# Patient Record
Sex: Female | Born: 1945 | Race: White | Hispanic: No | Marital: Married | Smoking: Never smoker
Health system: Southern US, Community
[De-identification: ages and names within clinical notes are randomized; demographics above are authoritative.]

## PROBLEM LIST (undated history)

## (undated) DIAGNOSIS — I10 Essential (primary) hypertension: Secondary | ICD-10-CM

## (undated) DIAGNOSIS — E7800 Pure hypercholesterolemia, unspecified: Secondary | ICD-10-CM

## (undated) DIAGNOSIS — K2970 Gastritis, unspecified, without bleeding: Secondary | ICD-10-CM

## (undated) DIAGNOSIS — E785 Hyperlipidemia, unspecified: Secondary | ICD-10-CM

## (undated) DIAGNOSIS — R0600 Dyspnea, unspecified: Secondary | ICD-10-CM

## (undated) DIAGNOSIS — M353 Polymyalgia rheumatica: Secondary | ICD-10-CM

## (undated) HISTORY — DX: Pure hypercholesterolemia, unspecified: E78.00

## (undated) HISTORY — DX: Gastritis, unspecified, without bleeding: K29.70

## (undated) HISTORY — PX: TUBAL LIGATION: SHX77

## (undated) HISTORY — PX: SPINE SURGERY: SHX786

## (undated) HISTORY — DX: Hyperlipidemia, unspecified: E78.5

## (undated) HISTORY — DX: Essential (primary) hypertension: I10

---

## 1993-05-18 HISTORY — PX: SPINE SURGERY: SHX786

## 2003-11-09 LAB — HM COLONOSCOPY

## 2004-03-03 LAB — HM COLONOSCOPY

## 2004-04-16 ENCOUNTER — Ambulatory Visit: Payer: Self-pay | Admitting: Family Medicine

## 2004-07-06 LAB — HM COLONOSCOPY

## 2005-11-09 ENCOUNTER — Ambulatory Visit: Payer: Self-pay | Admitting: Gynecology

## 2005-11-09 ENCOUNTER — Encounter (INDEPENDENT_AMBULATORY_CARE_PROVIDER_SITE_OTHER): Payer: Self-pay | Admitting: Gynecology

## 2005-12-16 HISTORY — PX: CARDIAC CATHETERIZATION: SHX172

## 2005-12-29 ENCOUNTER — Ambulatory Visit: Payer: Self-pay | Admitting: Internal Medicine

## 2006-11-25 ENCOUNTER — Ambulatory Visit: Payer: Self-pay | Admitting: Obstetrics & Gynecology

## 2006-11-25 ENCOUNTER — Encounter: Payer: Self-pay | Admitting: Obstetrics & Gynecology

## 2008-01-05 ENCOUNTER — Ambulatory Visit: Payer: Self-pay | Admitting: Obstetrics & Gynecology

## 2008-01-05 ENCOUNTER — Encounter: Payer: Self-pay | Admitting: Obstetrics & Gynecology

## 2008-07-06 LAB — HM DEXA SCAN

## 2009-01-30 ENCOUNTER — Encounter: Payer: Self-pay | Admitting: Obstetrics & Gynecology

## 2009-01-30 ENCOUNTER — Ambulatory Visit: Payer: Self-pay | Admitting: Obstetrics & Gynecology

## 2009-02-14 ENCOUNTER — Ambulatory Visit: Payer: Self-pay | Admitting: Obstetrics & Gynecology

## 2010-02-06 ENCOUNTER — Ambulatory Visit: Payer: Self-pay | Admitting: Obstetrics & Gynecology

## 2010-09-30 NOTE — Assessment & Plan Note (Signed)
[name], [name] NO.:  [id number]   MEDICAL RECORD NO.:  [id number]          PATIENT TYPE:  POB   LOCATION:  [facility] at [facility]         FACILITY:  [facility]   PHYSICIAN:  Elsie Lincoln, MD      DATE OF BIRTH:  06/20/1945   DATE OF SERVICE:  01/30/2009                                  CLINIC NOTE   The patient is a 65 year old female, who presents for yearly exam.  The  patient's libido is still decreased, however, husbands Viagra is  not  working as well, so their sexual encounter seem to be less which makes  the patient happy.  She never got a testosterone cream healed.  The  patient is having some left lower quadrant pain that is intermittent and  does not seem to be associated with bowel movements or urination.  We  will get a transvaginal ultrasound to look at her ovaries.  The patient  gets yearly exams from [facility] and is followed closely.  She has not  had any postmenopausal bleeding or any other complaints other than the  left lower quadrant pain.   PAST MEDICAL HISTORY:  Hypertension, hypercholesterolemia, non-insulin-  dependent diabetes mellitus.   SURGERY:  Spinal surgery.  She also had a bone graft for some dental  issues.   MEDICATIONS:  Crestor, metformin, aspirin, calcium, atenolol,  multivitamin, and fish oil.   ALLERGIES:  AMOXICILLIN and PENICILLIN, these are new allergies.  She  developed hives and had to take PREDNISONE.   FAMILY HISTORY:  No changes.   SOCIAL HISTORY:  No changes.   PHYSICAL EXAMINATION:  VITAL SIGNS:  Pulse 61, blood pressure 108/70,  weight 188, height 5 feet 6 inches.  GENERAL:  Well-nourished, well-developed, in no apparent distress.  HEENT:  Normocephalic, atraumatic.  Left lower jaw was missing teeth.  Thyroid, no masses.  LUNGS:  Clear to auscultation bilaterally.  HEART:  Regular rate and rhythm.  BREASTS:  No masses, nontender.  No lymphadenopathy.  No nipple  discharge.  ABDOMEN:  Obese, soft,  nontender.  No organomegaly.  No hernia.  No  rebound.  No guarding.  GENITALIA:  Tanner V.  Vagina atrophic, moderate cystocele.  Cervix  closed.  Uterus nontender.  Adnexa nontender.  No masses.  Hemorrhoid  present.  No rectocele.  EXTREMITIES:  Nontender.   ASSESSMENT AND PLAN:  A 65 year old female for yearly exam:  1. Pap smear.  2. Mammogram.  3. Transvaginal ultrasound to evaluate left lower quadrant pain.  4. Return to clinic for results in 2-3 weeks.           ______________________________  Elsie Lincoln, MD     KL/MEDQ  D:  01/30/2009  T:  01/31/2009  Job:  [id number]

## 2010-09-30 NOTE — Group Therapy Note (Signed)
[name], [name] NO.:  [id number]   MEDICAL RECORD NO.:  [id number]          PATIENT TYPE:  POB   LOCATION:  [facility]                   FACILITY:  [facility]   PHYSICIAN:  Sydney Lincoln, MD      DATE OF BIRTH:  10-01-1945   DATE OF SERVICE:                                  CLINIC NOTE   The patient comes to the office today for her yearly pap and pelvic. She  has no obvious complaints. She has noticed that when we discussed her  height and weight that she has noticed some height loss. She did have  spinal surgery several years ago. Her last bone mineral density was  several years ago.   She is currently well controlled with her diabetes, hypertension, and  high cholesterol by Dr. Burnett [name]. She has been having some recent  problems with some intermittent chest pain. She has had this evaluated  by a cardiologist. All of her stress tests were normal. She did have a  cardiac catheterization which was normal. From a female perspective, she  is not having any complaints. She is currently not on any hormone  replacement.   PHYSICAL EXAMINATION:  GENERAL:  This is a well developed, well  nourished 65 year old Caucasian in no acute distress.  VITAL SIGNS:  Blood pressure 130/76, pulse 56, weight 183, height 5 feet  6 inches.  HEENT:  No abnormalities. Thyroid was examined with no nodules noted.  BREASTS:  No masses, discharges, or tenderness.  CARDIAC:  Regular rate and rhythm, no murmurs, rubs, or thrills.  LUNGS:  Clear bilaterally with no wheezes noted.  ABDOMEN:  Positive bowel sounds. No tenderness or masses felt.  PELVIC:  Externally there were no lesions or discharges. Internally, the  vaginal vault is pink and smooth, her cervix is without lesions. Pap  smear was obtained. By manual exam, no cervical motion tenderness,  uterus was nontender, no adnexal masses.  SKIN:  No moles or rashes noted.   MEDICATIONS:  Crestor, Metformin, aspirin, calcium, and  Atenolol.   ASSESSMENT:  Well woman exam.   PLAN:  The patient will need to get a follow up bone mineral density as  previously discussed. [facility]  She will also need to have her yearly mammogram. She will have her  hypertension,  high cholesterol, and diabetes cared for again by Dr. Burnett [name]. She will  follow up here in this office in one year for her pap smear unless she  has difficulties, and then she may return sooner.      Sydney Richter, NP    ______________________________  Sydney Lincoln, MD    LR/MEDQ  D:  11/25/2006  T:  11/26/2006  Job:  [id number]

## 2010-09-30 NOTE — Assessment & Plan Note (Signed)
NAMEJENISIS, HARMSEN NO.:  [id number]   MEDICAL RECORD NO.:  [id number]          PATIENT TYPE:  POB   LOCATION:  [facility]         FACILITY:  [facility]   PHYSICIAN:  Elsie Lincoln, MD      DATE OF BIRTH:  1945-05-30   DATE OF SERVICE:  01/05/2008                                  CLINIC NOTE   The patient is a 65 year old female who presents for yearly physical.  Her only complaint is decreased libido.  Her husband wants sex almost  everyday, and she dreads going to bed.  Her husband does take Viagra  that is given to them by their joint general practitioner and suggested  that she talk to her husband and try to set limits and boundaries and  for him to only take the Viagra once the mutual agreement of sexual  intercourse is agreed upon.  Also, discussed the limitations of  testosterone cream and she was given a prescription for the testosterone  cream to the labia and clitoris.  She is not sure when she is going to  get it filled.  We did discuss side effects.   PAST MEDICAL HISTORY:  Hypertension, hypercholesterolemia, non-insulin-  dependent diabetes mellitus, these are all managed by Dr. Burnett Sheng.   SURGERY:  Spinal surgery.   MEDICATIONS:  Crestor, metformin, aspirin, calcium, and atenolol.   ALLERGIES:  Denies.   FAMILY HISTORY:  No changes.   SOCIAL HISTORY:  No changes.   PHYSICAL EXAMINATION:  VITAL SIGNS:  Pulse 61, blood pressure 109/71,  and weight 188.  GENERAL:  A well nourished and well developed, in no apparent distress.  HEENT:  Normocephalic and atraumatic.  Good dentition.  NECK:  Thyroid, no masses.  LUNGS:  Clear to auscultation bilaterally.  HEART:  Regular rate and rhythm.  BREASTS:  No masses and nontender.  No lymphadenopathy.  No nipple  discharge.  ABDOMEN:  Soft and nontender.  No organomegaly.  No hernia.  GENITALIA:  Tanner V.  Vagina atrophic, mild cystocele, and nontender.  Urethra nontender.  Cervix mild prolapse  and asymptomatic.  Uterus  nontender.  Adnexa no masses and nontender.  Rectovaginal, no  nodularity.  EXTREMITIES:  Nontender.   ASSESSMENT AND PLAN:  A 65 year old female for yearly exam.  1. Pap smear.  2. Mammogram.  3. Testosterone cream for decreased libido.  4. Return to clinic in a year.           ______________________________  Elsie Lincoln, MD     KL/MEDQ  D:  01/05/2008  T:  01/06/2008  Job:  [id number]

## 2010-09-30 NOTE — Assessment & Plan Note (Signed)
[name], [name] NO.:  [id number]   MEDICAL RECORD NO.:  [id number]          PATIENT TYPE:  POB   LOCATION:  [facility] at [facility]         FACILITY:  [facility]   PHYSICIAN:  Elsie Lincoln, MD      DATE OF BIRTH:  01/18/1946   DATE OF SERVICE:  02/06/2010                                  CLINIC NOTE   HISTORY:  The patient is a 65 year old female who presents for annual  exam.  The patient's only complaint is having some increased gas.  She  was recently started on iron for some mild anemia and she stopped, so I  do not think this is what is causing her gas.  She denies any diarrhea  or constipation.  She recently had a transvaginal ultrasound for left  upper quadrant pain which was normal.  I do not believe the gas is  caused from her gynecological organs.  If it does continue, I believe  she should get her primary care doctor.  She had a colonoscopy 7 years  ago that was normal.  The patient denies any new gynecological issues.  She has had no postmenopausal bleeding.  Her relationship with her  husband seems to improve as her husband has stopped asking for sex as  often, so her decreased libido is not as much of an issue.   PAST MEDICAL HISTORY:  No changes.  She does have non-insulin-dependent  diabetes, hypertension, and high cholesterol.   PAST SURGICAL HISTORY:  No changes.  She has had a spinal surgery and a  bone graft, and some dental issues.   MEDICATIONS:  No changes.  Crestor, metformin, aspirin, calcium,  atenolol, multivitamin, fish oil, and calcium.   ALLERGIES:  AMOXICILLIN and PENICILLIN.   FAMILY HISTORY:  No history of breast, ovarian, or endometrial cancer.   SOCIAL HISTORY:  No changes.  She lives with her husband and seems  happy.   PHYSICAL EXAMINATION:  VITAL SIGNS:  Pulse 60, blood pressure 125/80,  weight 192, and height 5 feet 6 inches.  GENERAL:  Well-nourished, well-developed, no apparent stress.  HEENT:  Normocephalic and  atraumatic.  Thyroid, no masses.  LUNGS:  Clear to auscultation bilaterally.  HEART:  Regular rate and rhythm.  BREASTS:  No masses, nontender, and no lymphadenopathy.  No nipple  discharge.  No skin changes.  ABDOMEN:  Soft, nontender, slightly obese.  No organomegaly.  No hernia.  GENITALIA:  Tanner V.  Pap smear was not done today secondary she has  had a normal Pap smear when the Pap smear was done last year.  Small  cystocele and rectocele.  Cervix, closed.  Uterus is small and  nontender.  Adnexa, ovaries not felt.  Rectovaginal nontender.  No  nodularity or rectocele.  EXTREMITIES:  No edema.   ASSESSMENT AND PLAN:  A 65 year old female for well-woman exam.  1. Normal bimanual.  2. Mammogram ordered.  3. Pap smear next year.  4. Continue calcium.   The patient had relatively normal bone mineral density exam in 2009,  will need one in 2014.           ______________________________  Elsie Lincoln,  MD     KL/MEDQ  D:  02/06/2010  T:  02/07/2010  Job:  [id number]

## 2011-02-11 ENCOUNTER — Encounter: Payer: Self-pay | Admitting: Internal Medicine

## 2011-02-11 ENCOUNTER — Ambulatory Visit (INDEPENDENT_AMBULATORY_CARE_PROVIDER_SITE_OTHER): Payer: BC Managed Care – PPO | Admitting: Internal Medicine

## 2011-02-11 DIAGNOSIS — E559 Vitamin D deficiency, unspecified: Secondary | ICD-10-CM

## 2011-02-11 DIAGNOSIS — E1169 Type 2 diabetes mellitus with other specified complication: Secondary | ICD-10-CM | POA: Insufficient documentation

## 2011-02-11 DIAGNOSIS — I10 Essential (primary) hypertension: Secondary | ICD-10-CM

## 2011-02-11 DIAGNOSIS — K5900 Constipation, unspecified: Secondary | ICD-10-CM

## 2011-02-11 DIAGNOSIS — D509 Iron deficiency anemia, unspecified: Secondary | ICD-10-CM

## 2011-02-11 DIAGNOSIS — K209 Esophagitis, unspecified without bleeding: Secondary | ICD-10-CM

## 2011-02-11 DIAGNOSIS — E785 Hyperlipidemia, unspecified: Secondary | ICD-10-CM | POA: Insufficient documentation

## 2011-02-11 DIAGNOSIS — R110 Nausea: Secondary | ICD-10-CM

## 2011-02-11 DIAGNOSIS — E119 Type 2 diabetes mellitus without complications: Secondary | ICD-10-CM

## 2011-02-11 DIAGNOSIS — Z79899 Other long term (current) drug therapy: Secondary | ICD-10-CM

## 2011-02-11 MED ORDER — OMEPRAZOLE 40 MG PO CPDR: 40.0000 mg | DELAYED_RELEASE_CAPSULE | Freq: Every day | ORAL | Status: DC

## 2011-02-11 MED ORDER — HYOSCYAMINE SULFATE 0.125 MG SL SUBL: 0.1250 mg | SUBLINGUAL_TABLET | SUBLINGUAL | Status: DC | PRN

## 2011-02-11 NOTE — Progress Notes (Signed)
  Subjective:    Patient ID: [name], female    DOB: 1945/08/30, 65 y.o.   MRN: [id number]  HPI  65 yo white female with diabetes mellitus, hyperlipidemia, and hypertension who presents for establishment of care with chief complaint of nausea accompanied by chest and jaw pain.  She   has been worked up for chest pain and jaw pain with myoview which was normal,followed by a cardiaccath which was normal in  2008.She could not afford nexium, so she is using prilosec with limited success.  No prior GI workup.  Has occasional dysphgia  And several episodes or regurgitation of what appeared to be water   Review of Systems  Constitutional: Negative for fever, chills and unexpected weight change.  HENT: Negative for hearing loss, ear pain, nosebleeds, congestion, sore throat, facial swelling, rhinorrhea, sneezing, mouth sores, trouble swallowing, neck pain, neck stiffness, voice change, postnasal drip, sinus pressure, tinnitus and ear discharge.   Eyes: Negative for pain, discharge, redness and visual disturbance.  Respiratory: Negative for cough, chest tightness, shortness of breath, wheezing and stridor.   Cardiovascular: Negative for chest pain, palpitations and leg swelling.  Gastrointestinal: Positive for nausea and constipation.  Musculoskeletal: Negative for myalgias and arthralgias.  Skin: Negative for color change and rash.  Neurological: Negative for dizziness, weakness, light-headedness and headaches.  Hematological: Negative for adenopathy.       Objective:   Physical Exam  Constitutional: She is oriented to person, place, and time. She appears well-developed and well-nourished.  HENT:  Mouth/Throat: Oropharynx is clear and moist.  Eyes: EOM are normal. Pupils are equal, round, and reactive to light. No scleral icterus.  Neck: Normal range of motion. Neck supple. No JVD present. No thyromegaly present.  Cardiovascular: Normal rate, regular rhythm, normal heart sounds and intact  distal pulses.   Pulmonary/Chest: Effort normal and breath sounds normal.  Abdominal: Soft. Bowel sounds are normal. She exhibits no mass. There is no tenderness.  Musculoskeletal: Normal range of motion. She exhibits no edema.  Lymphadenopathy:    She has no cervical adenopathy.  Neurological: She is alert and oriented to person, place, and time.  Skin: Skin is warm and dry.  Psychiatric: She has a normal mood and affect.          Assessment & Plan:  Screening for cervical cancer : normal one year ago Sept 2012.  Screening for breast Ca:  Done by GYN in Sept 2012

## 2011-02-11 NOTE — Assessment & Plan Note (Signed)
With episodes of chest pain and water brash despite daily use of omeprazole 20 mg daily.  Will increased prilsec to 40 mg daily,  Add trial of hyoscyamine and refer to GI for evaluation

## 2011-02-11 NOTE — Assessment & Plan Note (Signed)
Managed with atenolol once daily, which apears to be effective.

## 2011-02-11 NOTE — Assessment & Plan Note (Signed)
She is not restricting her carbohydrates or exercising regularly .  Spent 10 minutes reviewing diet and need for regular exercise. hgba1c is due.

## 2011-02-11 NOTE — Assessment & Plan Note (Signed)
Managed with crestor due to failure to reach goal with other  Statins.

## 2011-02-17 ENCOUNTER — Encounter: Payer: Self-pay | Admitting: Family Medicine

## 2011-02-17 ENCOUNTER — Ambulatory Visit (INDEPENDENT_AMBULATORY_CARE_PROVIDER_SITE_OTHER): Payer: BC Managed Care – PPO | Admitting: Family Medicine

## 2011-02-17 VITALS — BP 121/72 | HR 63 | Ht 65.0 in | Wt 189.0 lb

## 2011-02-17 DIAGNOSIS — Z1272 Encounter for screening for malignant neoplasm of vagina: Secondary | ICD-10-CM

## 2011-02-17 DIAGNOSIS — Z01419 Encounter for gynecological examination (general) (routine) without abnormal findings: Secondary | ICD-10-CM

## 2011-02-17 LAB — HM PAP SMEAR: HM Pap smear: NORMAL

## 2011-02-17 NOTE — Patient Instructions (Signed)

## 2011-02-17 NOTE — Progress Notes (Signed)
  Subjective:     [name] is a 65 y.o. female and is here for a comprehensive physical exam. The patient reports problems - Chest pain, seeing PCP, negative cardiac w/u waiting on GI, constipation.  Does exercise.Marland Kitchen  History   Social History  . Marital Status: Married    Spouse Name: N/A    Number of Children: N/A  . Years of Education: N/A   Occupational History  . Not on file.   Social History Main Topics  . Smoking status: Never Smoker   . Smokeless tobacco: Never Used  . Alcohol Use: No  . Drug Use: No  . Sexually Active: Not on file   Other Topics Concern  . Not on file   Social History Narrative  . No narrative on file   Health Maintenance  Topic Date Due  . Pap Smear  03/01/1965  . Tetanus/tdap  03/01/1966  . Mammogram  03/01/1997  . Colonoscopy  03/01/1997  . Zostavax  03/02/2007  . Influenza Vaccine  02/16/2011    The following portions of the patient's history were reviewed and updated as appropriate: allergies, current medications, past family history, past medical history, past social history, past surgical history and problem list.  Review of Systems A comprehensive review of systems was negative.   Objective:    BP 121/72  Pulse 63  Ht 5\' 5"  (1.651 m)  Wt 189 lb (85.73 kg)  BMI 31.45 kg/m2 General appearance: alert, cooperative and appears stated age Head: Normocephalic, without obvious abnormality, atraumatic Neck: no adenopathy, supple, symmetrical, trachea midline and thyroid not enlarged, symmetric, no tenderness/mass/nodules Lungs: clear to auscultation bilaterally Breasts: normal appearance, no masses or tenderness, No nipple retraction or dimpling Heart: regular rate and rhythm, S1, S2 normal, no murmur, click, rub or gallop Abdomen: soft, non-tender; bowel sounds normal; no masses,  no organomegaly Pelvic: cervix normal in appearance, external genitalia normal, no adnexal masses or tenderness, no cervical motion tenderness, uterus normal  size, shape, and consistency, vagina normal without discharge and atrophic.  Hemorrhoid noted Extremities: extremities normal, atraumatic, no cyanosis or edema, hammer toe Pulses: 2+ and symmetric Skin: Skin color, texture, turgor normal. No rashes or lesions Lymph nodes: Cervical, supraclavicular, and axillary nodes normal. Neurologic: Grossly normal    Assessment:    Healthy female exam. Chest pain, unclear etiology.   Plan:  Pap smear Mammogram Work up per PCP   See After Visit Summary for Counseling Recommendations

## 2011-02-18 ENCOUNTER — Other Ambulatory Visit: Payer: BC Managed Care – PPO

## 2011-02-20 ENCOUNTER — Other Ambulatory Visit (INDEPENDENT_AMBULATORY_CARE_PROVIDER_SITE_OTHER): Payer: BC Managed Care – PPO | Admitting: *Deleted

## 2011-02-20 DIAGNOSIS — I10 Essential (primary) hypertension: Secondary | ICD-10-CM

## 2011-02-20 DIAGNOSIS — E785 Hyperlipidemia, unspecified: Secondary | ICD-10-CM

## 2011-02-20 DIAGNOSIS — D509 Iron deficiency anemia, unspecified: Secondary | ICD-10-CM

## 2011-02-20 DIAGNOSIS — E119 Type 2 diabetes mellitus without complications: Secondary | ICD-10-CM

## 2011-02-20 DIAGNOSIS — K5900 Constipation, unspecified: Secondary | ICD-10-CM

## 2011-02-20 DIAGNOSIS — E559 Vitamin D deficiency, unspecified: Secondary | ICD-10-CM

## 2011-02-20 DIAGNOSIS — R110 Nausea: Secondary | ICD-10-CM

## 2011-02-20 LAB — COMPREHENSIVE METABOLIC PANEL
ALT: 21 U/L (ref 0–35)
AST: 31 U/L (ref 0–37)
Albumin: 4 g/dL (ref 3.5–5.2)
Alkaline Phosphatase: 74 U/L (ref 39–117)
BUN: 13 mg/dL (ref 6–23)
CO2: 26 mEq/L (ref 19–32)
Calcium: 9 mg/dL (ref 8.4–10.5)
Chloride: 103 mEq/L (ref 96–112)
Creatinine, Ser: 0.8 mg/dL (ref 0.4–1.2)
GFR: 76.76 mL/min (ref >60.00)
Glucose, Bld: 140 mg/dL — ABNORMAL HIGH (ref 70–99)
Potassium: 3.8 mEq/L (ref 3.5–5.1)
Sodium: 138 mEq/L (ref 135–145)
Total Bilirubin: 0.6 mg/dL (ref 0.3–1.2)
Total Protein: 7.3 g/dL (ref 6.0–8.3)

## 2011-02-20 LAB — HEMOGLOBIN A1C: Hgb A1c MFr Bld: 8.2 % — ABNORMAL HIGH (ref 4.6–6.5)

## 2011-02-20 LAB — LIPID PANEL
Cholesterol: 144 mg/dL (ref 0–200)
HDL: 46.5 mg/dL (ref >39.00)
LDL Cholesterol: 78 mg/dL (ref 0–99)
Total CHOL/HDL Ratio: 3
Triglycerides: 100 mg/dL (ref 0.0–149.0)
VLDL: 20 mg/dL (ref 0.0–40.0)

## 2011-02-20 LAB — IRON AND TIBC
%SAT: 15 % — ABNORMAL LOW (ref 20–55)
Iron: 55 ug/dL (ref 42–145)
TIBC: 375 ug/dL (ref 250–470)
UIBC: 320 ug/dL (ref 125–400)

## 2011-02-20 LAB — TSH: TSH: 0.94 u[IU]/mL (ref 0.35–5.50)

## 2011-02-20 LAB — FERRITIN: Ferritin: 20.6 ng/mL (ref 10.0–291.0)

## 2011-02-21 LAB — VITAMIN D 25 HYDROXY (VIT D DEFICIENCY, FRACTURES): Vit D, 25-Hydroxy: 53 ng/mL (ref 30–89)

## 2011-02-25 LAB — HM MAMMOGRAPHY: HM Mammogram: NORMAL

## 2011-04-24 ENCOUNTER — Ambulatory Visit: Payer: Self-pay | Admitting: Gastroenterology

## 2011-04-29 LAB — PATHOLOGY REPORT

## 2011-04-30 ENCOUNTER — Ambulatory Visit: Payer: Self-pay | Admitting: Gastroenterology

## 2011-05-21 ENCOUNTER — Other Ambulatory Visit (INDEPENDENT_AMBULATORY_CARE_PROVIDER_SITE_OTHER): Payer: BC Managed Care – PPO | Admitting: *Deleted

## 2011-05-21 DIAGNOSIS — E119 Type 2 diabetes mellitus without complications: Secondary | ICD-10-CM

## 2011-05-21 LAB — HEMOGLOBIN A1C: Hgb A1c MFr Bld: 7.1 % — ABNORMAL HIGH (ref 4.6–6.5)

## 2011-05-25 ENCOUNTER — Ambulatory Visit (INDEPENDENT_AMBULATORY_CARE_PROVIDER_SITE_OTHER): Payer: BC Managed Care – PPO | Admitting: Internal Medicine

## 2011-05-25 ENCOUNTER — Encounter: Payer: Self-pay | Admitting: Internal Medicine

## 2011-05-25 DIAGNOSIS — R110 Nausea: Secondary | ICD-10-CM

## 2011-05-25 DIAGNOSIS — I10 Essential (primary) hypertension: Secondary | ICD-10-CM

## 2011-05-25 DIAGNOSIS — E119 Type 2 diabetes mellitus without complications: Secondary | ICD-10-CM

## 2011-05-25 MED ORDER — GLIPIZIDE 2.5 MG HALF TABLET: 2.5000 mg | ORAL_TABLET | Freq: Two times a day (BID) | ORAL | Status: DC

## 2011-05-25 NOTE — Patient Instructions (Addendum)
Let's try stopping the metformin and starting very low dose glipizide whenever you resume a normal diet.   If you fell like you're having low sugars with the glipizde,  Stop it and call the office   Return in 3 months with fasting labs prior to visit

## 2011-05-25 NOTE — Assessment & Plan Note (Signed)
Dramatic improvement with reduction in a1c to 7.1 from 8.2.  Stopping metformin and adding low dosed glipizide given complaints of nausea.  Foot exam was normal and she is up to date on eye exaams.

## 2011-05-25 NOTE — Progress Notes (Signed)
Subjective:    Patient ID: [name], female    DOB: February 21, 1946, 66 y.o.   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 CC of nausea, persistent, with apparently normal EGD and a prior normal  cardiology evaluation.  NO improvement with 40 mg prisolec or with trial of aciphex. Scheduled for gastric emptying study and barium swallow later on this week.   Past Medical History  Diagnosis Date  . Hyperlipidemia     Lipitor trial was a failure  . Hypertension   . Diabetes mellitus   . High cholesterol    Current Outpatient Prescriptions on File Prior to Visit  Medication Sig Dispense Refill  . aspirin 81 MG tablet Take 81 mg by mouth daily.        Marland Kitchen atenolol (TENORMIN) 100 MG tablet Take 100 mg by mouth daily.        . Calcium Carbonate-Vitamin D (CALCIUM + D) 600-200 MG-UNIT TABS Take by mouth 3 (three) times daily.        [name] Calcium (STOOL SOFTENER PO) Take 1,000 mg by mouth daily.        . ferrous sulfate 325 (65 FE) MG tablet Take 325 mg by mouth daily with breakfast.        . fish oil-omega-3 fatty acids 1000 MG capsule Take 2 g by mouth 2 (two) times daily.        Marland Kitchen omeprazole (PRILOSEC) 40 MG capsule Take 1 capsule (40 mg total) by mouth daily.  30 capsule  1  . rosuvastatin (CRESTOR) 20 MG tablet Take 20 mg by mouth daily.           Review of Systems  Constitutional: Negative for fever, chills and unexpected weight change.  HENT: Negative for hearing loss, ear pain, nosebleeds, congestion, sore throat, facial swelling, rhinorrhea, sneezing, mouth sores, trouble swallowing, neck pain, neck stiffness, voice change, postnasal drip, sinus pressure, tinnitus and ear discharge.   Eyes: Negative for pain, discharge, redness and visual disturbance.  Respiratory: Negative for cough, chest tightness, shortness of breath, wheezing and stridor.   Cardiovascular: Negative for chest pain, palpitations and  leg swelling.  Musculoskeletal: Negative for myalgias and arthralgias.  Skin: Negative for color change and rash.  Neurological: Negative for dizziness, weakness, light-headedness and headaches.  Hematological: Negative for adenopathy.  BP 124/82  Pulse 60  Temp(Src) 97.8 F (36.6 C) (Oral)  Wt 188 lb (85.276 kg)      Objective:   Physical Exam  Constitutional: She is oriented to person, place, and time. She appears well-developed and well-nourished.  HENT:  Mouth/Throat: Oropharynx is clear and moist.  Eyes: EOM are normal. Pupils are equal, round, and reactive to light. No scleral icterus.  Neck: Normal range of motion. Neck supple. No JVD present. No thyromegaly present.  Cardiovascular: Normal rate, regular rhythm, normal heart sounds and intact distal pulses.   Pulmonary/Chest: Effort normal and breath sounds normal.  Abdominal: Soft. Bowel sounds are normal. She exhibits no mass. There is no tenderness.  Musculoskeletal: Normal range of motion. She exhibits no edema.  Lymphadenopathy:    She has no cervical adenopathy.  Neurological: She is alert and oriented to person, place, and time. No sensory deficit.       Diabetic foot exam: sensation intact to light touch  Skin: Skin is warm and dry.  Psychiatric: She has a normal mood and affect.  Assessment & Plan:   Nausea Workup thus far unrevealing.  Discussed stopping metformin .  GI workup continues with gastric emptying study later on this week  Diabetes mellitus Dramatic improvement with reduction in a1c to 7.1 from 8.2.  Stopping metformin and adding low dosed glipizide given complaints of nausea.  Foot exam was normal and she is up to date on eye exaams.   Hypertension Well controlled on current regimen. Renal function stable, no changes today.    Updated Medication List Outpatient Encounter Prescriptions as of 05/25/2011  Medication Sig Dispense Refill  . aspirin 81 MG tablet Take 81 mg by mouth daily.         Marland Kitchen atenolol (TENORMIN) 100 MG tablet Take 100 mg by mouth daily.        . Calcium Carbonate-Vitamin D (CALCIUM + D) 600-200 MG-UNIT TABS Take by mouth 3 (three) times daily.        [name] Calcium (STOOL SOFTENER PO) Take 1,000 mg by mouth daily.        . ferrous sulfate 325 (65 FE) MG tablet Take 325 mg by mouth daily with breakfast.        . fish oil-omega-3 fatty acids 1000 MG capsule Take 2 g by mouth 2 (two) times daily.        Marland Kitchen omeprazole (PRILOSEC) 40 MG capsule Take 1 capsule (40 mg total) by mouth daily.  30 capsule  1  . rosuvastatin (CRESTOR) 20 MG tablet Take 20 mg by mouth daily.        Marland Kitchen glipiZIDE (GLUCOTROL) 2.5 mg TABS Take 0.5 tablets (2.5 mg total) by mouth 2 (two) times daily before a meal.  30 tablet  3  . DISCONTD: metFORMIN (GLUCOPHAGE) 1000 MG tablet Take 1,000 mg by mouth 2 (two) times daily with a meal.

## 2011-05-25 NOTE — Assessment & Plan Note (Signed)
Well controlled on current regimen. Renal function stable, no changes today. 

## 2011-05-25 NOTE — Assessment & Plan Note (Signed)
Workup thus far unrevealing.  Discussed stopping metformin .  GI workup continues with gastric emptying study later on this week

## 2011-05-27 ENCOUNTER — Ambulatory Visit: Payer: Self-pay | Admitting: Gastroenterology

## 2011-05-29 ENCOUNTER — Ambulatory Visit: Payer: Self-pay | Admitting: Gastroenterology

## 2011-06-29 ENCOUNTER — Ambulatory Visit: Payer: BC Managed Care – PPO | Admitting: Internal Medicine

## 2011-06-30 ENCOUNTER — Ambulatory Visit: Payer: BC Managed Care – PPO | Admitting: Internal Medicine

## 2011-07-07 ENCOUNTER — Encounter: Payer: Self-pay | Admitting: Internal Medicine

## 2011-07-07 ENCOUNTER — Ambulatory Visit (INDEPENDENT_AMBULATORY_CARE_PROVIDER_SITE_OTHER): Payer: BC Managed Care – PPO | Admitting: Internal Medicine

## 2011-07-07 VITALS — BP 122/80 | HR 72 | Temp 97.4°F | Wt 195.0 lb

## 2011-07-07 DIAGNOSIS — I208 Other forms of angina pectoris: Secondary | ICD-10-CM | POA: Insufficient documentation

## 2011-07-07 DIAGNOSIS — K760 Fatty (change of) liver, not elsewhere classified: Secondary | ICD-10-CM

## 2011-07-07 DIAGNOSIS — R079 Chest pain, unspecified: Secondary | ICD-10-CM

## 2011-07-07 DIAGNOSIS — I10 Essential (primary) hypertension: Secondary | ICD-10-CM

## 2011-07-07 DIAGNOSIS — K7689 Other specified diseases of liver: Secondary | ICD-10-CM

## 2011-07-07 DIAGNOSIS — E119 Type 2 diabetes mellitus without complications: Secondary | ICD-10-CM

## 2011-07-07 MED ORDER — GLIPIZIDE 2.5 MG HALF TABLET: 2.5000 mg | ORAL_TABLET | Freq: Two times a day (BID) | ORAL | Status: DC

## 2011-07-07 NOTE — Progress Notes (Signed)
Subjective:    Patient ID: [name], female    DOB: 1946/03/09, 66 y.o.   MRN: [id number]  HPI  Ms. Rothschild is a delightful 66 yr old white female with a history of DM Type 2, hyperlipidemia and chronic nausea who was referred by her gastrenterologist for voicing reports of periodic chest pian radiating to her  jaw, occurring at rest. She has had these episodes in the past and underwent  Cardiac evaluation with eventual cardiac catheterization in 2007 by [facility] which was reportedly normal. Her episodes are infrequent,  But her last episode was particularly troubling in that it was accompanied by a full sweat.  She has had an exhaustive GI workup including normal GB ultrasound fatty liver with fatty infiltrated pancreas, unincarcerated ventral hernia , normal gastric emptying study , EGD with mild gastritis, no H Pylori or Barrett's .   Past Medical History  Diagnosis Date  . Hyperlipidemia     Lipitor trial was a failure  . Hypertension   . Diabetes mellitus   . High cholesterol    Current Outpatient Prescriptions on File Prior to Visit  Medication Sig Dispense Refill  . aspirin 81 MG tablet Take 81 mg by mouth daily.        Marland Kitchen atenolol (TENORMIN) 100 MG tablet Take 100 mg by mouth daily.        . Calcium Carbonate-Vitamin D (CALCIUM + D) 600-200 MG-UNIT TABS Take by mouth 3 (three) times daily.        Tery Sanfilippo Calcium (STOOL SOFTENER PO) Take 1,000 mg by mouth daily.        . ferrous sulfate 325 (65 FE) MG tablet Take 325 mg by mouth daily with breakfast.        . fish oil-omega-3 fatty acids 1000 MG capsule Take 2 g by mouth 2 (two) times daily.        . rosuvastatin (CRESTOR) 20 MG tablet Take 20 mg by mouth daily.        Marland Kitchen omeprazole (PRILOSEC) 40 MG capsule Take 1 capsule (40 mg total) by mouth daily.  30 capsule  1    Review of Systems  Constitutional: Negative for fever, chills and unexpected weight change.  HENT: Negative for hearing loss, ear pain, nosebleeds,  congestion, sore throat, facial swelling, rhinorrhea, sneezing, mouth sores, trouble swallowing, neck pain, neck stiffness, voice change, postnasal drip, sinus pressure, tinnitus and ear discharge.   Eyes: Negative for pain, discharge, redness and visual disturbance.  Respiratory: Positive for chest tightness. Negative for cough, shortness of breath, wheezing and stridor.   Cardiovascular: Negative for chest pain, palpitations and leg swelling.  Gastrointestinal: Positive for nausea.  Musculoskeletal: Negative for myalgias and arthralgias.  Skin: Negative for color change and rash.  Neurological: Negative for dizziness, weakness, light-headedness and headaches.  Hematological: Negative for adenopathy.       Objective:   Physical Exam  Constitutional: She is oriented to person, place, and time. She appears well-developed and well-nourished.  HENT:  Mouth/Throat: Oropharynx is clear and moist.  Eyes: EOM are normal. Pupils are equal, round, and reactive to light. No scleral icterus.  Neck: Normal range of motion. Neck supple. No JVD present. No thyromegaly present.  Cardiovascular: Normal rate, regular rhythm, normal heart sounds and intact distal pulses.   Pulmonary/Chest: Effort normal and breath sounds normal.  Abdominal: Soft. Bowel sounds are normal. She exhibits no mass. There is no tenderness.  Musculoskeletal: Normal range of motion. She exhibits no edema.  Lymphadenopathy:    She has no cervical adenopathy.  Neurological: She is alert and oriented to person, place, and time.  Skin: Skin is warm and dry.  Psychiatric: She has a normal mood and affect.      Assessment & Plan:   Chest pain at rest Given her unrevealing GI workup and her history of diabetes, hypertension and hyperlipidemia, I will repeat her cardiac evaluation since it has been over 5 years since her last evaluation . She does not want to see Dr. Juliann Pares again.  Will refer to [facility] cardiology.   Fatty liver  disease, nonalcoholic By ultrasound, not confirmed with biopsy, with normal liver enzymes and synthetic function .  Management of hyperlipidemia and diabetes.  Hypertension .well controlled on current medications. No changes today.    Updated Medication List Outpatient Encounter Prescriptions as of 07/07/2011  Medication Sig Dispense Refill  . aspirin 81 MG tablet Take 81 mg by mouth daily.        Marland Kitchen atenolol (TENORMIN) 100 MG tablet Take 100 mg by mouth daily.        . Calcium Carbonate-Vitamin D (CALCIUM + D) 600-200 MG-UNIT TABS Take by mouth 3 (three) times daily.        Tery Sanfilippo Calcium (STOOL SOFTENER PO) Take 1,000 mg by mouth daily.        . Famotidine-Ca Carb-Mag Hydrox (PEPCID COMPLETE PO) Take by mouth as needed      . ferrous sulfate 325 (65 FE) MG tablet Take 325 mg by mouth daily with breakfast.        . fish oil-omega-3 fatty acids 1000 MG capsule Take 2 g by mouth 2 (two) times daily.        Marland Kitchen glipiZIDE (GLUCOTROL) 2.5 mg TABS Take 0.5 tablets (2.5 mg total) by mouth 2 (two) times daily before a meal.  90 tablet  3  . rosuvastatin (CRESTOR) 20 MG tablet Take 20 mg by mouth daily.        Marland Kitchen DISCONTD: glipiZIDE (GLUCOTROL) 2.5 mg TABS Take 0.5 tablets (2.5 mg total) by mouth 2 (two) times daily before a meal.  30 tablet  3  . omeprazole (PRILOSEC) 40 MG capsule Take 1 capsule (40 mg total) by mouth daily.  30 capsule  1

## 2011-07-07 NOTE — Assessment & Plan Note (Signed)
By ultrasound, not confirmed with biopsy, with normal liver enzymes and synthetic function .  Management of hyperlipidemia and diabetes.

## 2011-07-07 NOTE — Assessment & Plan Note (Addendum)
Given her unrevealing GI workup and her history of diabetes, hypertension and hyperlipidemia, I will repeat her cardiac evaluation since it has been over 5 years since her last evaluation . She does not want to see Dr. Juliann Pares again.  Will refer to [facility] cardiology.

## 2011-07-07 NOTE — Assessment & Plan Note (Signed)
.  well controlled on current medications. No changes today.

## 2011-07-07 NOTE — Patient Instructions (Signed)
I am sending you for a repeat cardiology evaluation to [facility] cardiology   Return for fasting labs after April 3.

## 2011-07-21 ENCOUNTER — Encounter: Payer: Self-pay | Admitting: Internal Medicine

## 2011-07-30 ENCOUNTER — Other Ambulatory Visit: Payer: Self-pay | Admitting: Internal Medicine

## 2011-07-30 MED ORDER — GLIPIZIDE 5 MG PO TABS: 2.5000 mg | ORAL_TABLET | Freq: Two times a day (BID) | ORAL | Status: DC

## 2011-08-06 ENCOUNTER — Ambulatory Visit (INDEPENDENT_AMBULATORY_CARE_PROVIDER_SITE_OTHER): Payer: BC Managed Care – PPO | Admitting: Cardiovascular Disease

## 2011-08-06 ENCOUNTER — Encounter: Payer: Self-pay | Admitting: Cardiovascular Disease

## 2011-08-06 VITALS — BP 143/84 | HR 58 | Ht 66.0 in | Wt 193.8 lb

## 2011-08-06 DIAGNOSIS — E119 Type 2 diabetes mellitus without complications: Secondary | ICD-10-CM

## 2011-08-06 DIAGNOSIS — R079 Chest pain, unspecified: Secondary | ICD-10-CM

## 2011-08-06 DIAGNOSIS — E785 Hyperlipidemia, unspecified: Secondary | ICD-10-CM

## 2011-08-06 DIAGNOSIS — I10 Essential (primary) hypertension: Secondary | ICD-10-CM

## 2011-08-06 MED ORDER — NITROGLYCERIN 0.4 MG SL SUBL: 0.4000 mg | SUBLINGUAL_TABLET | SUBLINGUAL | Status: AC | PRN

## 2011-08-06 NOTE — Assessment & Plan Note (Signed)
We have encouraged continued exercise, careful diet management in an effort to lose weight. 

## 2011-08-06 NOTE — Assessment & Plan Note (Signed)
Atypical type chest pain. Cardiac catheterization in 2007 showing no significant disease. She is exercising on a regular basis  without any symptoms. She does have pain relief with Levsin. We will also give her nitroglycerin to take as needed for symptom relief. As symptoms are rare, she is not particularly excited to try long-acting nitroglycerin on a regular basis, such as isosorbide mononitrate daily. She'll prefer to take medications as needed.  She's not a candidate for cardiac MRI given history of back surgery and hardware. No further testing has been ordered at this time. We will try to obtain the echocardiogram from Dr. Juliann Pares.

## 2011-08-06 NOTE — Assessment & Plan Note (Signed)
Blood pressure is well controlled on today's visit. No changes made to the medications. 

## 2011-08-06 NOTE — Assessment & Plan Note (Signed)
Encouraged her to stay on her current dose of statin. Will defer to Dr. Darrick Huntsman.

## 2011-08-06 NOTE — Patient Instructions (Addendum)
You are doing well. No medication changes were made. Take nitroglycerin as needed for chest pain  Please call us if you have new issues that need to be addressed before your next appt.

## 2011-08-06 NOTE — Progress Notes (Signed)
Patient ID: [name], female    DOB: 03/24/1946, 65 y.o.   MRN: [id number]  HPI Comments: [name] is a very pleasant 66 year old woman with a history of chronic chest and jaw pain over the past 6 or more years with previous stress testing including Myoview at [facility], cardiac catheterization in 2007 showing no significant coronary artery disease, extensive GI workup including motility studies who presents for further evaluation for her chest pain.   She reports that she continues to have episodes of chest and jaw pain. They occur approximately once per month, sometimes more. They typically come on at rest with no symptoms with exertion or exercise. She exercises at the gym several times per week doing water aerobics weights and aerobic walking. She has tried proton pump inhibitors in the past with no improvement of her pain. In the past, she did have significant nausea. This is improved though she continues to have mild symptoms. Over the past 5 episodes, she has taken Levsin with good pain relief. She has tried nitroglycerin in the past but does not have any currently. She thinks that it worked in the past for her chest pain.   Cardiac catheterization in 2007 showed no significant coronary artery disease. She reports having an echocardiogram by Dr. Juliann Pares.  EKG today shows normal sinus rhythm with rate 58 beats per minute with no significant ST or T wave changes, poor R wave progression through the anterior precordial leads.   Outpatient Encounter Prescriptions as of 08/06/2011  Medication Sig Dispense Refill  . aspirin 81 MG tablet Take 81 mg by mouth daily.        Marland Kitchen atenolol (TENORMIN) 50 MG tablet Take 50 mg by mouth daily.      . Calcium Carbonate-Vitamin D (CALCIUM + D) 600-200 MG-UNIT TABS Take by mouth 3 (three) times daily.        . Famotidine-Ca Carb-Mag Hydrox (PEPCID COMPLETE PO) Take by mouth as needed      . fish oil-omega-3 fatty acids 1000 MG capsule Take 2 g by mouth 2 (two)  times daily.        Marland Kitchen glipiZIDE (GLUCOTROL) 5 MG tablet Take 0.5 tablets (2.5 mg total) by mouth 2 (two) times daily before a meal.  90 tablet  11  . hyoscyamine (LEVSIN, ANASPAZ) 0.125 MG tablet Take 0.125 mg by mouth every 4 (four) hours as needed.      . rosuvastatin (CRESTOR) 20 MG tablet Take 20 mg by mouth daily.        . nitroGLYCERIN (NITROSTAT) 0.4 MG SL tablet Place 1 tablet (0.4 mg total) under the tongue every 5 (five) minutes as needed for chest pain.  25 tablet  3    Review of Systems  Constitutional: Negative.   HENT: Negative.   Eyes: Negative.   Respiratory: Negative.   Cardiovascular: Positive for chest pain.       Associated jaw pain  Gastrointestinal: Negative.   Musculoskeletal: Negative.   Skin: Negative.   Neurological: Negative.   Hematological: Negative.   Psychiatric/Behavioral: Negative.   All other systems reviewed and are negative.    BP 143/84  Pulse 58  Ht 5\' 6"  (1.676 m)  Wt 193 lb 12.8 oz (87.907 kg)  BMI 31.28 kg/m2  Physical Exam  Nursing note and vitals reviewed. Constitutional: She is oriented to person, place, and time. She appears well-developed and well-nourished.  HENT:  Head: Normocephalic.  Nose: Nose normal.  Mouth/Throat: Oropharynx is clear and moist.  Eyes:  Conjunctivae are normal. Pupils are equal, round, and reactive to light.  Neck: Normal range of motion. Neck supple. No JVD present.  Cardiovascular: Normal rate, regular rhythm, S1 normal, S2 normal, normal heart sounds and intact distal pulses.  Exam reveals no gallop and no friction rub.   No murmur heard. Pulmonary/Chest: Effort normal and breath sounds normal. No respiratory distress. She has no wheezes. She has no rales. She exhibits no tenderness.  Abdominal: Soft. Bowel sounds are normal. She exhibits no distension. There is no tenderness.  Musculoskeletal: Normal range of motion. She exhibits no edema and no tenderness.  Lymphadenopathy:    She has no cervical  adenopathy.  Neurological: She is alert and oriented to person, place, and time. Coordination normal.  Skin: Skin is warm and dry. No rash noted. No erythema.  Psychiatric: She has a normal mood and affect. Her behavior is normal. Judgment and thought content normal.         Assessment and Plan

## 2011-08-18 ENCOUNTER — Other Ambulatory Visit: Payer: BC Managed Care – PPO

## 2011-08-20 ENCOUNTER — Telehealth: Payer: Self-pay | Admitting: *Deleted

## 2011-08-20 ENCOUNTER — Other Ambulatory Visit (INDEPENDENT_AMBULATORY_CARE_PROVIDER_SITE_OTHER): Payer: BC Managed Care – PPO | Admitting: *Deleted

## 2011-08-20 DIAGNOSIS — E119 Type 2 diabetes mellitus without complications: Secondary | ICD-10-CM

## 2011-08-20 DIAGNOSIS — Z79899 Other long term (current) drug therapy: Secondary | ICD-10-CM

## 2011-08-20 LAB — COMPREHENSIVE METABOLIC PANEL
ALT: 29 U/L (ref 0–35)
AST: 38 U/L — ABNORMAL HIGH (ref 0–37)
Albumin: 4 g/dL (ref 3.5–5.2)
Alkaline Phosphatase: 69 U/L (ref 39–117)
BUN: 15 mg/dL (ref 6–23)
CO2: 26 mEq/L (ref 19–32)
Calcium: 9.2 mg/dL (ref 8.4–10.5)
Chloride: 102 mEq/L (ref 96–112)
Creatinine, Ser: 0.8 mg/dL (ref 0.4–1.2)
GFR: 73.45 mL/min (ref >60.00)
Glucose, Bld: 117 mg/dL — ABNORMAL HIGH (ref 70–99)
Potassium: 4.3 mEq/L (ref 3.5–5.1)
Sodium: 138 mEq/L (ref 135–145)
Total Bilirubin: 0.5 mg/dL (ref 0.3–1.2)
Total Protein: 7.1 g/dL (ref 6.0–8.3)

## 2011-08-20 LAB — HEMOGLOBIN A1C: Hgb A1c MFr Bld: 6.7 % — ABNORMAL HIGH (ref 4.6–6.5)

## 2011-08-20 NOTE — Telephone Encounter (Signed)
Labs ordered.

## 2011-08-20 NOTE — Telephone Encounter (Signed)
Patient came in for labs this morning. What labs would you like for me to draw?

## 2011-08-20 NOTE — Telephone Encounter (Signed)
CMET, HGBa1c

## 2011-08-24 ENCOUNTER — Encounter: Payer: Self-pay | Admitting: Internal Medicine

## 2011-08-24 ENCOUNTER — Ambulatory Visit (INDEPENDENT_AMBULATORY_CARE_PROVIDER_SITE_OTHER): Payer: BC Managed Care – PPO | Admitting: Internal Medicine

## 2011-08-24 VITALS — BP 130/76 | HR 55 | Temp 97.9°F | Resp 14 | Wt 192.0 lb

## 2011-08-24 DIAGNOSIS — R079 Chest pain, unspecified: Secondary | ICD-10-CM

## 2011-08-24 DIAGNOSIS — E119 Type 2 diabetes mellitus without complications: Secondary | ICD-10-CM

## 2011-08-24 DIAGNOSIS — K7689 Other specified diseases of liver: Secondary | ICD-10-CM

## 2011-08-24 DIAGNOSIS — E1169 Type 2 diabetes mellitus with other specified complication: Secondary | ICD-10-CM

## 2011-08-24 DIAGNOSIS — E669 Obesity, unspecified: Secondary | ICD-10-CM

## 2011-08-24 DIAGNOSIS — E785 Hyperlipidemia, unspecified: Secondary | ICD-10-CM

## 2011-08-24 DIAGNOSIS — K760 Fatty (change of) liver, not elsewhere classified: Secondary | ICD-10-CM

## 2011-08-24 DIAGNOSIS — I10 Essential (primary) hypertension: Secondary | ICD-10-CM

## 2011-08-24 MED ORDER — ROSUVASTATIN CALCIUM 20 MG PO TABS: 20.0000 mg | ORAL_TABLET | Freq: Every day | ORAL | Status: DC

## 2011-08-24 NOTE — Progress Notes (Signed)
Patient ID: [name], female   DOB: Jul 27, 1945, 66 y.o.   MRN: [id number]  Patient Active Problem List  Diagnoses  . Hyperlipidemia  . Iron deficiency anemia  . Diabetes mellitus type 2 in obese  . Hypertension  . Nausea  . Fatty liver disease, nonalcoholic  . Chest pain at rest    Subjective:  CC:   Chief Complaint  Patient presents with  . Follow-up    HPI:   December [name] a 66 y.o. female who presents  Follow up on chest pain, type 2 diabetes mellitus,  Fatty liver, overweight.  Was recently seen by Dossie Arbour for atypical chest pain, who reassured her that her heart was not the source.  Taking levsin or NTG for recurrent pain .  Pain has currently resolved.  Inquiring about a product her friend is selling called Limu , made from [facility] extract and administered in a liquid ($60/month for tow teaspoons daily) and credited with "fixes everything that's wrong with you" by the peddlers. She wants to lose weight and feel better but has not been consistent with exercise yet due to the weather.  She does enjoy long walks and also uses the [place] .  Had a low blood sugar recently during a late night shift at the hospital caused by fasting .  Taking glipizide 2.5 mg twice daily .   Past Medical History  Diagnosis Date  . Hyperlipidemia     Lipitor trial was a failure  . Hypertension   . Diabetes mellitus   . High cholesterol     Past Surgical History  Procedure Date  . Spine surgery 1995  . Tubal ligation   . Spine surgery     bone graft          The following portions of the patient's history were reviewed and updated as appropriate: Allergies, current medications, and problem list.    Review of Systems:   12 Pt  review of systems was negative except those addressed in the HPI,     History   Social History  . Marital Status: Married    Spouse Name: N/A    Number of Children: N/A  . Years of Education: N/A   Occupational History  . Not on file.    Social History Main Topics  . Smoking status: Never Smoker   . Smokeless tobacco: Never Used  . Alcohol Use: No  . Drug Use: No  . Sexually Active: Not on file   Other Topics Concern  . Not on file   Social History Narrative  . No narrative on file    Objective:  BP 130/76  Pulse 55  Temp(Src) 97.9 F (36.6 C) (Oral)  Resp 14  Wt 192 lb (87.091 kg)  SpO2 98%  General appearance: alert, cooperative and appears stated age Ears: normal TM's and external ear canals both ears Throat: lips, mucosa, and tongue normal; teeth and gums normal Neck: no adenopathy, no carotid bruit, supple, symmetrical, trachea midline and thyroid not enlarged, symmetric, no tenderness/mass/nodules Back: symmetric, no curvature. ROM normal. No CVA tenderness. Lungs: clear to auscultation bilaterally Heart: regular rate and rhythm, S1, S2 normal, no murmur, click, rub or gallop Abdomen: soft, non-tender; bowel sounds normal; no masses,  no organomegaly Pulses: 2+ and symmetric Skin: Skin color, texture, turgor normal. No rashes or lesions Lymph nodes: Cervical, supraclavicular, and axillary nodes normal.  Assessment and Plan:  Hypertension well controlled, renal function stable,  No changes today   Hyperlipidemia  LDL 78 on current Crestor dosing and she is tolerating it well.   Fatty liver disease, nonalcoholic recommending low glycemic index diet   Diabetes mellitus type 2 in obese I have addressed  BMI and recommended a low glycemic index diet utilizing smaller more frequent meals to increase metabolism.  I have also recommended that patient start exercising with a goal of 30 minutes of aerobic exercise a minimum of 5 days per week. She is up to date on eye exams.  hgba1c is now 6.7 .  No changes to meds today.      Chest pain at rest Atypical with no signs of cardiac disease per Dr. Mariah Milling.  Continue prn Levsin or NTG for presumed esophageal spasm.     Updated Medication  List Outpatient Encounter Prescriptions as of 08/24/2011  Medication Sig Dispense Refill  . aspirin 81 MG tablet Take 81 mg by mouth daily.        Marland Kitchen atenolol (TENORMIN) 50 MG tablet Take 50 mg by mouth daily.      . Calcium Carbonate-Vitamin D (CALCIUM + D) 600-200 MG-UNIT TABS Take by mouth 3 (three) times daily.        . Famotidine-Ca Carb-Mag Hydrox (PEPCID COMPLETE PO) Take by mouth as needed      . fish oil-omega-3 fatty acids 1000 MG capsule Take 2 g by mouth 2 (two) times daily.        Marland Kitchen glipiZIDE (GLUCOTROL) 5 MG tablet Take 0.5 tablets (2.5 mg total) by mouth 2 (two) times daily before a meal.  90 tablet  11  . hyoscyamine (LEVSIN, ANASPAZ) 0.125 MG tablet Take 0.125 mg by mouth every 4 (four) hours as needed.      . nitroGLYCERIN (NITROSTAT) 0.4 MG SL tablet Place 1 tablet (0.4 mg total) under the tongue every 5 (five) minutes as needed for chest pain.  25 tablet  3  . rosuvastatin (CRESTOR) 20 MG tablet Take 1 tablet (20 mg total) by mouth daily.  30 tablet  6  . DISCONTD: rosuvastatin (CRESTOR) 20 MG tablet Take 20 mg by mouth daily.           No orders of the defined types were placed in this encounter.    No Follow-up on file.

## 2011-08-24 NOTE — Assessment & Plan Note (Signed)
LDL 78 on current Crestor dosing and she is tolerating it well.

## 2011-08-24 NOTE — Patient Instructions (Signed)
Consider the Low Glycemic Index Diet and 6 smaller meals daily :   7 AM Low carbohydrate Protein  Shakes (EAS Carb Control  Or Atkins ,  Available everywhere,   In  cases at BJs )  2.5 carbs  (Add or substitute a toasted sandwhich thin w/ peanut butter)  10 AM: Protein bar by Atkins (snack size,  Chocolate lover's variety at  [employer]J's)    Lunch: sandwich on pita bread or flatbread (Joseph's makes a pita bread and a flat bread , available at [employer] and [employer]'s; Toufayah makes a low carb flatbread available at [employer] and [employer])  [employer] and Mission make a low carb whole wheat tortilla available everywhere  3 PM:  Mid day :  Another protein bar,  Or a  cheese stick, 1/4 cup of almonds, walnuts, pistachios, pecans, peanuts,  Macadamia nuts  6 PM  Dinner:  "mean and green:"  Meat/chicken/fish, salad, and green veggie : use ranch, vinagrette,  Blue cheese, etc  9 PM snack : Breyer's low carb fudgiscle or  ice cream bar (Carb Smart) Weight Watcher's ice cream bar , or another protein shake

## 2011-08-24 NOTE — Assessment & Plan Note (Signed)
well controlled, renal function stable,  No changes today

## 2011-08-24 NOTE — Assessment & Plan Note (Signed)
Atypical with no signs of cardiac disease per Dr. Mariah Milling.  Continue prn Levsin or NTG for presumed esophageal spasm.

## 2011-08-24 NOTE — Assessment & Plan Note (Signed)
I have addressed  BMI and recommended a low glycemic index diet utilizing smaller more frequent meals to increase metabolism.  I have also recommended that patient start exercising with a goal of 30 minutes of aerobic exercise a minimum of 5 days per week. She is up to date on eye exams.  hgba1c is now 6.7 .  No changes to meds today.

## 2011-08-24 NOTE — Assessment & Plan Note (Signed)
recommending low glycemic index diet

## 2011-09-29 ENCOUNTER — Other Ambulatory Visit: Payer: Self-pay | Admitting: Internal Medicine

## 2011-09-29 MED ORDER — ROSUVASTATIN CALCIUM 20 MG PO TABS: 20.0000 mg | ORAL_TABLET | Freq: Every day | ORAL | Status: DC

## 2011-10-08 ENCOUNTER — Other Ambulatory Visit: Payer: Self-pay | Admitting: Internal Medicine

## 2011-10-08 MED ORDER — ATENOLOL 50 MG PO TABS: 50.0000 mg | ORAL_TABLET | Freq: Every day | ORAL | Status: DC

## 2011-11-17 ENCOUNTER — Telehealth: Payer: Self-pay | Admitting: *Deleted

## 2011-11-17 NOTE — Telephone Encounter (Signed)
Fasting lipids, hgba1c,  tsh   Cmet., urine micr alb/cr ratio

## 2011-11-17 NOTE — Telephone Encounter (Signed)
Patient is coming in for labs tomorrow, what labs would you like for me to order

## 2011-11-18 ENCOUNTER — Other Ambulatory Visit (INDEPENDENT_AMBULATORY_CARE_PROVIDER_SITE_OTHER): Payer: BC Managed Care – PPO | Admitting: *Deleted

## 2011-11-18 DIAGNOSIS — I10 Essential (primary) hypertension: Secondary | ICD-10-CM

## 2011-11-18 DIAGNOSIS — E785 Hyperlipidemia, unspecified: Secondary | ICD-10-CM

## 2011-11-18 DIAGNOSIS — E119 Type 2 diabetes mellitus without complications: Secondary | ICD-10-CM

## 2011-11-18 LAB — LIPID PANEL
Cholesterol: 137 mg/dL (ref 0–200)
HDL: 46.6 mg/dL (ref >39.00)
LDL Cholesterol: 70 mg/dL (ref 0–99)
Total CHOL/HDL Ratio: 3
Triglycerides: 103 mg/dL (ref 0.0–149.0)
VLDL: 20.6 mg/dL (ref 0.0–40.0)

## 2011-11-18 LAB — COMPREHENSIVE METABOLIC PANEL
ALT: 18 U/L (ref 0–35)
AST: 28 U/L (ref 0–37)
Albumin: 3.9 g/dL (ref 3.5–5.2)
Alkaline Phosphatase: 69 U/L (ref 39–117)
BUN: 18 mg/dL (ref 6–23)
CO2: 27 mEq/L (ref 19–32)
Calcium: 9.2 mg/dL (ref 8.4–10.5)
Chloride: 103 mEq/L (ref 96–112)
Creatinine, Ser: 0.8 mg/dL (ref 0.4–1.2)
GFR: 78.85 mL/min (ref >60.00)
Glucose, Bld: 123 mg/dL — ABNORMAL HIGH (ref 70–99)
Potassium: 4.3 mEq/L (ref 3.5–5.1)
Sodium: 138 mEq/L (ref 135–145)
Total Bilirubin: 0.9 mg/dL (ref 0.3–1.2)
Total Protein: 7.1 g/dL (ref 6.0–8.3)

## 2011-11-18 LAB — TSH: TSH: 1.74 u[IU]/mL (ref 0.35–5.50)

## 2011-11-18 LAB — HEMOGLOBIN A1C: Hgb A1c MFr Bld: 7 % — ABNORMAL HIGH (ref 4.6–6.5)

## 2011-11-19 LAB — MICROALBUMIN, URINE: Microalb, Ur: 0.81 mg/dL (ref 0.00–1.89)

## 2011-11-23 ENCOUNTER — Ambulatory Visit: Payer: BC Managed Care – PPO | Admitting: Internal Medicine

## 2011-12-02 ENCOUNTER — Ambulatory Visit (INDEPENDENT_AMBULATORY_CARE_PROVIDER_SITE_OTHER): Payer: BC Managed Care – PPO | Admitting: Internal Medicine

## 2011-12-02 ENCOUNTER — Encounter: Payer: Self-pay | Admitting: Internal Medicine

## 2011-12-02 VITALS — BP 114/78 | HR 58 | Temp 97.7°F | Ht 66.0 in | Wt 196.0 lb

## 2011-12-02 DIAGNOSIS — E669 Obesity, unspecified: Secondary | ICD-10-CM

## 2011-12-02 DIAGNOSIS — E119 Type 2 diabetes mellitus without complications: Secondary | ICD-10-CM

## 2011-12-02 DIAGNOSIS — I10 Essential (primary) hypertension: Secondary | ICD-10-CM

## 2011-12-02 DIAGNOSIS — E1169 Type 2 diabetes mellitus with other specified complication: Secondary | ICD-10-CM

## 2011-12-02 DIAGNOSIS — K760 Fatty (change of) liver, not elsewhere classified: Secondary | ICD-10-CM

## 2011-12-02 DIAGNOSIS — K7689 Other specified diseases of liver: Secondary | ICD-10-CM

## 2011-12-02 DIAGNOSIS — R110 Nausea: Secondary | ICD-10-CM

## 2011-12-02 LAB — HM DIABETES FOOT EXAM: HM Diabetic Foot Exam: NORMAL

## 2011-12-02 NOTE — Patient Instructions (Addendum)
We are going to try dexilant daily for 2 weeks instead of pepcid to see if your gastritis   Consider a Low Glycemic Index Diet and eating 6 smaller meals daily .  This frequent feeding stimulates your metabolism and the lower glycemic index foods will lower your blood sugars:   This is an example of my daily  "Low GI"  Diet:  All of the foods can be found at grocery stores and in bulk at [employer]Js  club   7 AM Breakfast:  Low carbohydrate Protein  Shakes (I recommend the EAS AdvantEdge "Carb Control" shakes  Or the low carb shakes by Atkins.   Both are available everywhere:  In  cases at BJs  Or in 4 packs at grocery stores and pharmacies  2.5 carbs  (Alternative is  a toasted Arnold's Sandwhich Thin w/ peanut butter, a "Bagel Thin" with cream cheese and salmon) or  a scrambled egg burrito made with a low carb tortilla .  Avoid cereal and bananas, oatmeal too unless the old fashioned kind that takes 30-40 minutes to prepare.  the rest is overly processed, has minimal fiber, and loaded with carbohydrates!   10 AM: Protein bar by Atkins (the snack size, under 200 cal.  There are many varieties , available widely again or in bulk in limited varieties at BJs)  Other so called "protein bars" tend to be loaded with carbohydrates.  Remember, in food advertising, the word "energy" is synonymous for " carbohydrate."  Lunch: sandwich of [place], (or any lunchmeat or canned tuna), fresh avocado and cheese on a lower carbohydrate pita bread, flatbread, or tortilla . Ok to use mayonnaise. The bread is the only source or carbohydrate that can be decreased (Joseph's makes a pita bread and a flat bread  Are 50 cal and 4 net carbs ; Toufayan makes a low carb flatbread 100 cal and 9 net carbs  and  Mission makes a low carb whole wheat tortilla  210 cal and 6 net carbs)  3 PM:  Mid day :  Another proteintttt bThe brear,  Or a  cheese stick (100 cal, 0 carbs),  Or 1 ounce of  almonds, walnuts, pistachios, pecans, peanuts,   Macadamia nuts. Or a Dannon light n Fit greek yogurt, 80 cal 8 net carbs . Avoid "granola"; the dried cranberries and raisins are loaded with carbohydrates.    6 PM  Dinner:  "mean and green:"  Meat/chicken/fish or a high protein legume; , with a green salad, and a low GI  Veggie (broccoli, cauliflower, green beans, spinach, brussel sprouts. Lima beans) : Avoid "Low fat dressings, Reyne Dumas and [address]! They are loaded with sugar! Instead use ranch, vinagrette,  Blue cheese, etc  9 PM snack : Breyer's "low carb" fudgsicle or  ice cream bar (Carb Smart line), or  Weight Watcher's ice cream bar , or anouther "no sugar added" ice cream; or another protein shake or a serving of fresh fruit with whipped cream (Avoid bananas, pineapple, grapes  and watermelon on a regular basis because they are high in sugar)   Remember that snack Substitutions should be less than 15 to 20 carbs  Per serving. Remember to subtract fiber grams to get the "net carbs."

## 2011-12-02 NOTE — Progress Notes (Signed)
Patient ID: [name], female   DOB: 25-Jan-1946, 66 y.o.   MRN: [id number]  Patient Active Problem List  Diagnosis  . Hyperlipidemia  . Iron deficiency anemia  . Diabetes mellitus type 2 in obese  . Hypertension  . Nausea  . Fatty liver disease, nonalcoholic  . Chest pain at rest    Subjective:  CC:   Chief Complaint  Patient presents with  . Follow-up    3 month F/U    HPI:   [name] a 66 y.o. female who presents 3 month  follow up on diabetes, hypertension, hyperlipidemia.  She denies any recent recurrence of chest pain or dyspnea but continues to have nausea and almost daily basis and has been using Pepcid twice daily to manage her symptoms. She has been seen by Dr. Franchot Mimes recently evaluation of chest pain in the setting of a normal cardiac cath in 2007. No further workup was done. She underwent comprehensive workup for her nausea in December 2012 including EGD which showed gastric erythema and irregular Z line and a papule with normal biopsies. She had hydroscan after that showing a normal EF at 80%. CT scan with and without contrast showed only a fatty liver and some fatty infiltration of the pancreas. Gastric emptying study showed 40% emptying at 95 minutes which is normal. No signs of reflux were seen on emptying study. Despite her persistent nausea she has actually gained 8 pounds since January. She is requesting counseling on diet and exercise. She did not bring a log of her blood sugars with her today. She is due for her followup labs.   Past Medical History  Diagnosis Date  . Hyperlipidemia     Lipitor trial was a failure  . Hypertension   . Diabetes mellitus   . High cholesterol     Past Surgical History  Procedure Date  . Spine surgery 1995  . Tubal ligation   . Spine surgery     bone graft     The following portions of the patient's history were reviewed and updated as appropriate: Allergies, current medications, and problem list.    Review of  Systems:  A comprehensive ROS was done and positive for nausea.   The rest was negative.       History   Social History  . Marital Status: Married    Spouse Name: N/A    Number of Children: N/A  . Years of Education: N/A   Occupational History  . Not on file.   Social History Main Topics  . Smoking status: Never Smoker   . Smokeless tobacco: Never Used  . Alcohol Use: No  . Drug Use: No  . Sexually Active: Not on file   Other Topics Concern  . Not on file   Social History Narrative  . No narrative on file    Objective:  BP 114/78  Pulse 58  Temp 97.7 F (36.5 C) (Oral)  Ht 5\' 6"  (1.676 m)  Wt 196 lb (88.905 kg)  BMI 31.64 kg/m2  SpO2 97%  General appearance: alert, cooperative and appears stated age Ears: normal TM's and external ear canals both ears Throat: lips, mucosa, and tongue normal; teeth and gums normal Neck: no adenopathy, no carotid bruit, supple, symmetrical, trachea midline and thyroid not enlarged, symmetric, no tenderness/mass/nodules Back: symmetric, no curvature. ROM normal. No CVA tenderness. Lungs: clear to auscultation bilaterally Heart: regular rate and rhythm, S1, S2 normal, no murmur, click, rub or gallop Abdomen: soft, non-tender; bowel sounds  normal; no masses,  no organomegaly Pulses: 2+ and symmetric Skin: Skin color, texture, turgor normal. No rashes or lesions Lymph nodes: Cervical, supraclavicular, and axillary nodes normal. Foot exam:  Nails are well trimmed,  No callouses,  Sensation intact to microfilament  Assessment and Plan:  Nausea As a comprehensive workup for her nausea although was found with some mild gastric erythema. Since her symptoms are not controlled currently on Pepcid we will try Dexilant for 2 weeks , as she has tried Nexium Prilosec and AcipHex with no significant improvement  Fatty liver disease, nonalcoholic Done by CT scan during workup for nausea. I recommended that she lose weight and follow a low  glycemic index diet to facilitate reduction of risk for progression to cirrhosis  Diabetes mellitus type 2 in obese Managed with glipizide. She does take a baby aspirin daily a statin for cholesterol management. 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 Urine microalbumin to creatinine ratio is due. She is not on an ACE inhibitor.  have addressed  BMI and recommended a low glycemic index diet utilizing smaller more frequent meals to increase metabolism.  I have also recommended that patient start exercising with a goal of 30 minutes of aerobic exercise a minimum of 5 days per week. Handout given.   Hypertension Well controlled on current medications.  No changes today.   Updated Medication List Outpatient Encounter Prescriptions as of 12/02/2011  Medication Sig Dispense Refill  . aspirin 81 MG tablet Take 81 mg by mouth daily.        Marland Kitchen atenolol (TENORMIN) 50 MG tablet Take 1 tablet (50 mg total) by mouth daily.  90 tablet  3  . Calcium Carbonate-Vitamin D (CALCIUM + D) 600-200 MG-UNIT TABS Take by mouth 3 (three) times daily.        . Famotidine-Ca Carb-Mag Hydrox (PEPCID COMPLETE PO) Take by mouth as needed      . fish oil-omega-3 fatty acids 1000 MG capsule Take 2 g by mouth 2 (two) times daily.        Marland Kitchen glipiZIDE (GLUCOTROL) 5 MG tablet Take 0.5 tablets (2.5 mg total) by mouth 2 (two) times daily before a meal.  90 tablet  11  . hyoscyamine (LEVSIN, ANASPAZ) 0.125 MG tablet Take 0.125 mg by mouth every 4 (four) hours as needed.      . nitroGLYCERIN (NITROSTAT) 0.4 MG SL tablet Place 1 tablet (0.4 mg total) under the tongue every 5 (five) minutes as needed for chest pain.  25 tablet  3  . rosuvastatin (CRESTOR) 20 MG tablet Take 1 tablet (20 mg total) by mouth daily.  90 tablet  3     No orders of the defined types were placed in this encounter.    No Follow-up on file.

## 2011-12-05 NOTE — Assessment & Plan Note (Addendum)
Managed with glipizide. She does take a baby aspirin daily a statin for cholesterol management. 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 Urine microalbumin to creatinine ratio is due. She is not on an ACE inhibitor.  have addressed  BMI and recommended a low glycemic index diet utilizing smaller more frequent meals to increase metabolism.  I have also recommended that patient start exercising with a goal of 30 minutes of aerobic exercise a minimum of 5 days per week. Handout given.

## 2011-12-05 NOTE — Assessment & Plan Note (Signed)
As a comprehensive workup for her nausea although was found with some mild gastric erythema. Since her symptoms are not controlled currently on Pepcid we will try Dexilant for 2 weeks , as she has tried Nexium Prilosec and AcipHex with no significant improvement

## 2011-12-05 NOTE — Assessment & Plan Note (Signed)
Well controlled on current medications.  No changes today. 

## 2011-12-05 NOTE — Assessment & Plan Note (Signed)
Done by CT scan during workup for nausea. I recommended that she lose weight and follow a low glycemic index diet to facilitate reduction of risk for progression to cirrhosis

## 2011-12-14 ENCOUNTER — Other Ambulatory Visit: Payer: Self-pay | Admitting: Internal Medicine

## 2011-12-14 MED ORDER — DEXLANSOPRAZOLE 60 MG PO CPDR: 60.0000 mg | DELAYED_RELEASE_CAPSULE | Freq: Every day | ORAL | Status: DC

## 2012-02-22 ENCOUNTER — Encounter: Payer: Self-pay | Admitting: Obstetrics & Gynecology

## 2012-02-22 ENCOUNTER — Ambulatory Visit (INDEPENDENT_AMBULATORY_CARE_PROVIDER_SITE_OTHER): Payer: BC Managed Care – PPO | Admitting: Obstetrics & Gynecology

## 2012-02-22 VITALS — BP 130/81 | HR 61 | Ht 66.0 in | Wt 197.0 lb

## 2012-02-22 DIAGNOSIS — Z1151 Encounter for screening for human papillomavirus (HPV): Secondary | ICD-10-CM

## 2012-02-22 DIAGNOSIS — Z124 Encounter for screening for malignant neoplasm of cervix: Secondary | ICD-10-CM

## 2012-02-22 DIAGNOSIS — Z1239 Encounter for other screening for malignant neoplasm of breast: Secondary | ICD-10-CM

## 2012-02-22 DIAGNOSIS — Z01419 Encounter for gynecological examination (general) (routine) without abnormal findings: Secondary | ICD-10-CM

## 2012-02-22 LAB — HM PAP SMEAR: HM Pap smear: POSITIVE

## 2012-02-22 NOTE — Progress Notes (Signed)
Patient ID: [name], female   DOB: 1946-01-12, 66 y.o.   MRN: [id number] Subjective:     [name] is a 66 y.o. female here for a routine exam.  Current complaints: none.  Pt with no GYN complaints.  Gynecologic History No LMP recorded. Patient is postmenopausal. Contraception: post menopausal status Last Pap: 2010. Results were: normal Last mammogram: Oct 2012. Results were:normal Obstetric History OB History    Grav Para Term Preterm Abortions TAB SAB Ect Mult Living   2 2 2       2      # Outc Date GA Lbr Len/2nd Wgt Sex Del Anes PTL Lv   1 TRM     M SVD      2 TRM     M SVD          The following portions of the patient's history were reviewed and updated as appropriate: allergies, current medications, past family history, past medical history, past social history, past surgical history and problem list.  Review of Systems A comprehensive review of systems was negative.    Objective:    BP 130/81  Pulse 61  Ht 5\' 6"  (1.676 m)  Wt 197 lb (89.359 kg)  BMI 31.80 kg/m2  General Appearance:    Alert, cooperative, no distress, appears stated age  Head:    Normocephalic, without obvious abnormality, atraumatic              Neck:   Supple, symmetrical, trachea midline, no adenopathy;    thyroid:  no enlargement/tenderness/nodules; no carotid   bruit or JVD  Back:     Symmetric, no curvature, ROM normal, no CVA tenderness  Lungs:     Clear to auscultation bilaterally, respirations unlabored  Chest Wall:    No tenderness or deformity   Heart:    Regular rate and rhythm, S1 and S2 normal, no murmur, rub   or gallop  Breast Exam:    No tenderness, masses, or nipple abnormality  Abdomen:     Soft, non-tender, bowel sounds active all four quadrants,    no masses, no organomegaly  Genitalia:    Normal female without lesion, discharge or tenderness GU: EGBUS: no lesions Vagina: no blood in vault Cervix: no lesion; no mucopurulent d/c Uterus: small, mobile Adnexa: no  masses; nontender         Extremities:   Extremities normal, atraumatic, no cyanosis or edema  Pulses:   2+ and symmetric all extremities  Skin:   Skin color, texture, turgor normal, no rashes or lesions  Lymph nodes:   Cervical, supraclavicular, and axillary nodes normal  Neurologic:   CNII-XII intact, normal strength, sensation and reflexes    throughout      Assessment:    Healthy female exam.    Plan:    Mammogram ordered. Follow up in: 1 year.   F/u PaP and HPV  Ada Woodbury L. Harraway-Smith, M.D., [place]

## 2012-02-22 NOTE — Patient Instructions (Signed)
Mammogram Tips  Healthy women should begin getting mammograms every year or two once they reach age 65, and once a year when they reach age 50. Here are tips:  · Find an experienced, high-volume center with accomplished radiologists. You can ask for their credentials.  · Ask to see the certificate showing the center is approved by the U.S. Food and Drug Administration.  · Use the same center regularly, so it is easier to compare your new mammograms with your old ones.  · Bring a list of places you have had mammograms, dates, biopsies or other breast treatments. Bring old mammograms with you or have them sent to your primary caregiver.  · Describe any breast problems to your caregiver or the person doing the mammogram. Be ready to give past surgeries, birth control pills, hormone use, breast implants, growths, moles, breast scars and family or personal history of breast cancer.  · Call your doctor or center to check on the mammogram if you hear nothing within 10 days. Do not assume everything was normal.  · To protect your privacy, the mammogram results cannot be given over the phone or to anyone but you.  · Radiation from a mammogram is very low and does not pose a radiation risk.  · Mammograms can detect breast problems other than breast cancer.  · You may be asked stand or sit in front of the X-ray machine.  · Two small plastic or glass plates are placed around the breast when taking the X-ray.  · If you are menstruating, schedule your mammogram a week after your menstrual period.  · Do not wear deodorants, powder or perfume when getting a mammogram.  · Wash your breasts and under your arms before getting a mammogram.  · Wear cloths that are easy for you to undress and dress.  · Arrive at the center at least 15 minutes before the mammogram is scheduled.  · There may be slight discomfort during the mammogram, but it goes away shortly after the test.  · Try to relax as much as possible during the mammogram.  · Talk  to your caregiver if you do not understand the results of the mammogram.  · Follow the recommendations of your caregiver regarding further tests and treatments if needed.  · Get a second opinion if you are concerned or question the results of the mammogram, further tests or treatment if needed.  · Continue with monthly self-breast exams and yearly caregiver exams even if the mammogram is normal.  · Your caregiver may recommend getting a mammogram before age 65 and more often if you are at high risk for developing breast cancer.  Document Released: 08/20/2005 Document Revised: 07/27/2011 Document Reviewed: 04/27/2008  Exit[employer]® Patient Information ©2013 [employer].

## 2012-02-22 NOTE — Progress Notes (Signed)
Patient ID: [name], female   DOB: 1945/12/25, 66 y.o.   MRN: [id number] Yearly exam, no complaints.

## 2012-02-24 ENCOUNTER — Telehealth: Payer: Self-pay

## 2012-02-24 NOTE — Telephone Encounter (Signed)
Fasting lipids, and CMET.  thanks

## 2012-02-25 ENCOUNTER — Other Ambulatory Visit (INDEPENDENT_AMBULATORY_CARE_PROVIDER_SITE_OTHER): Payer: Medicare Other

## 2012-02-25 DIAGNOSIS — E119 Type 2 diabetes mellitus without complications: Secondary | ICD-10-CM

## 2012-02-25 LAB — COMPREHENSIVE METABOLIC PANEL
ALT: 19 U/L (ref 0–35)
AST: 29 U/L (ref 0–37)
Albumin: 3.7 g/dL (ref 3.5–5.2)
Alkaline Phosphatase: 78 U/L (ref 39–117)
BUN: 17 mg/dL (ref 6–23)
CO2: 26 mEq/L (ref 19–32)
Calcium: 9.2 mg/dL (ref 8.4–10.5)
Chloride: 102 mEq/L (ref 96–112)
Creatinine, Ser: 0.8 mg/dL (ref 0.4–1.2)
GFR: 75.43 mL/min (ref >60.00)
Glucose, Bld: 129 mg/dL — ABNORMAL HIGH (ref 70–99)
Potassium: 4 mEq/L (ref 3.5–5.1)
Sodium: 136 mEq/L (ref 135–145)
Total Bilirubin: 0.8 mg/dL (ref 0.3–1.2)
Total Protein: 7 g/dL (ref 6.0–8.3)

## 2012-02-25 LAB — LIPID PANEL
Cholesterol: 142 mg/dL (ref 0–200)
HDL: 42 mg/dL (ref >39.00)
LDL Cholesterol: 81 mg/dL (ref 0–99)
Total CHOL/HDL Ratio: 3
Triglycerides: 93 mg/dL (ref 0.0–149.0)
VLDL: 18.6 mg/dL (ref 0.0–40.0)

## 2012-02-25 LAB — HEMOGLOBIN A1C: Hgb A1c MFr Bld: 6.8 % — ABNORMAL HIGH (ref 4.6–6.5)

## 2012-03-03 ENCOUNTER — Ambulatory Visit (INDEPENDENT_AMBULATORY_CARE_PROVIDER_SITE_OTHER): Payer: BC Managed Care – PPO | Admitting: Internal Medicine

## 2012-03-03 ENCOUNTER — Encounter: Payer: Self-pay | Admitting: Internal Medicine

## 2012-03-03 ENCOUNTER — Telehealth: Payer: Self-pay | Admitting: Internal Medicine

## 2012-03-03 VITALS — BP 128/72 | HR 54 | Temp 97.7°F | Ht 66.0 in | Wt 198.5 lb

## 2012-03-03 DIAGNOSIS — E119 Type 2 diabetes mellitus without complications: Secondary | ICD-10-CM

## 2012-03-03 DIAGNOSIS — R079 Chest pain, unspecified: Secondary | ICD-10-CM

## 2012-03-03 DIAGNOSIS — K7689 Other specified diseases of liver: Secondary | ICD-10-CM

## 2012-03-03 DIAGNOSIS — K760 Fatty (change of) liver, not elsewhere classified: Secondary | ICD-10-CM

## 2012-03-03 DIAGNOSIS — E669 Obesity, unspecified: Secondary | ICD-10-CM

## 2012-03-03 DIAGNOSIS — Z23 Encounter for immunization: Secondary | ICD-10-CM

## 2012-03-03 DIAGNOSIS — Z1211 Encounter for screening for malignant neoplasm of colon: Secondary | ICD-10-CM

## 2012-03-03 DIAGNOSIS — R110 Nausea: Secondary | ICD-10-CM

## 2012-03-03 DIAGNOSIS — E1169 Type 2 diabetes mellitus with other specified complication: Secondary | ICD-10-CM

## 2012-03-03 DIAGNOSIS — D509 Iron deficiency anemia, unspecified: Secondary | ICD-10-CM

## 2012-03-03 NOTE — Progress Notes (Signed)
Patient ID: [name], female   DOB: 1945/12/19, 66 y.o.   MRN: [id number]  Patient Active Problem List  Diagnosis  . Hyperlipidemia  . Iron deficiency anemia  . Diabetes mellitus type 2 in obese  . Hypertension  . Nausea  . Fatty liver disease, nonalcoholic  . Chest pain at rest  . Gastritis    Subjective:  CC:   Chief Complaint  Patient presents with  . Follow-up    HPI:   [name] a 66 y.o. female who presents 3 month follow up on diabetes, hypertension, gastritis. She just returned from a trip to [place] with family.  Feels fine. Her nausea and chest pain have resolved with daily use of dexilant.  She has had several hypoglycemic events on the nights that she works a 12 hour shift at [employer] because she is more active and eats before she starts her shift.    Past Medical History  Diagnosis Date  . Hyperlipidemia     Lipitor trial was a failure  . Hypertension   . Diabetes mellitus   . High cholesterol   . Gastritis     Past Surgical History  Procedure Date  . Spine surgery 1995  . Tubal ligation   . Spine surgery     bone graft   . Cardiac catheterization Augus 2007    normal coronaries,  EF 60%         The following portions of the patient's history were reviewed and updated as appropriate: Allergies, current medications, and problem list.    Review of Systems:   12 Pt  review of systems was negative except those addressed in the HPI,     History   Social History  . Marital Status: Married    Spouse Name: N/A    Number of Children: N/A  . Years of Education: N/A   Occupational History  . Not on file.   Social History Main Topics  . Smoking status: Never Smoker   . Smokeless tobacco: Never Used  . Alcohol Use: No  . Drug Use: No  . Sexually Active: Yes -- Female partner(s)   Other Topics Concern  . Not on file   Social History Narrative  . No narrative on file    Objective:  BP 128/72  Pulse 54  Temp 97.7 F (36.5 C)  (Oral)  Ht 5\' 6"  (1.676 m)  Wt 198 lb 8 oz (90.039 kg)  BMI 32.04 kg/m2  SpO2 97%  General appearance: alert, cooperative and appears stated age Ears: normal TM's and external ear canals both ears Throat: lips, mucosa, and tongue normal; teeth and gums normal Neck: no adenopathy, no carotid bruit, supple, symmetrical, trachea midline and thyroid not enlarged, symmetric, no tenderness/mass/nodules Back: symmetric, no curvature. ROM normal. No CVA tenderness. Lungs: clear to auscultation bilaterally Heart: regular rate and rhythm, S1, S2 normal, no murmur, click, rub or gallop Abdomen: soft, non-tender; bowel sounds normal; no masses,  no organomegaly Pulses: 2+ and symmetric Skin: Skin color, texture, turgor normal. No rashes or lesions Lymph nodes: Cervical, supraclavicular, and axillary nodes normal.  Assessment and Plan:  Nausea Secondary to gastritis found on EGD .  Controlled only with dexilant  Prior trials of nexium  And omeprazole failed.   Iron deficiency anemia New onset by former PCP last year.  Stool was checked for blood and negative. Last colonoscopy 7 yrs ago at [facility]   Chest pain at rest Noncardiac, chronic ,  Normal cath 2007.  Normal GI workup except for gastric erythema. resoled with use of Dexilant.   Fatty liver disease, nonalcoholic Addressing obesity and hyperlipidemia with diet and statins.  LFTS are normal.   Diabetes mellitus type 2 in obese Well controlled, with hypoglycemic episodes occurring durring 12 hour shift as [employer].  Advised to suspend her glucotrol dose on the days she works to prevent recurrence.    Updated Medication List Outpatient Encounter Prescriptions as of 03/03/2012  Medication Sig Dispense Refill  . aspirin 81 MG tablet Take 81 mg by mouth daily.        Marland Kitchen atenolol (TENORMIN) 50 MG tablet Take 1 tablet (50 mg total) by mouth daily.  90 tablet  3  . Calcium Carbonate-Vitamin D (CALCIUM + D) 600-200 MG-UNIT TABS Take by mouth  3 (three) times daily.        Marland Kitchen dexlansoprazole (DEXILANT) 60 MG capsule Take 1 capsule (60 mg total) by mouth daily.  90 capsule  3  . Famotidine-Ca Carb-Mag Hydrox (PEPCID COMPLETE PO) Take by mouth as needed      . fish oil-omega-3 fatty acids 1000 MG capsule Take 2 g by mouth 2 (two) times daily.        Marland Kitchen glipiZIDE (GLUCOTROL) 5 MG tablet Take 0.5 tablets (2.5 mg total) by mouth 2 (two) times daily before a meal.  90 tablet  11  . hyoscyamine (LEVSIN, ANASPAZ) 0.125 MG tablet Take 0.125 mg by mouth every 4 (four) hours as needed.      . rosuvastatin (CRESTOR) 20 MG tablet Take 1 tablet (20 mg total) by mouth daily.  90 tablet  3  . nitroGLYCERIN (NITROSTAT) 0.4 MG SL tablet Place 1 tablet (0.4 mg total) under the tongue every 5 (five) minutes as needed for chest pain.  25 tablet  3     Orders Placed This Encounter  Procedures  . HM PAP SMEAR  . HM PAP SMEAR  . Ambulatory referral to Gastroenterology  . HM COLONOSCOPY    Return in about 3 months (around 06/03/2012).

## 2012-03-03 NOTE — Assessment & Plan Note (Signed)
Secondary to gastritis found on EGD .  Controlled only with dexilant  Prior trials of nexium  And omeprazole failed.

## 2012-03-03 NOTE — Telephone Encounter (Signed)
Pt wanted to know if she could have labs done prior to her welcome to medicare appointment in [place]

## 2012-03-03 NOTE — Telephone Encounter (Signed)
Left message on patient VM that medicare will not pay for labs ahead of time.

## 2012-03-03 NOTE — Telephone Encounter (Signed)
No,   Medicare will not pay for labs ahead of the time anymore.

## 2012-03-03 NOTE — Patient Instructions (Addendum)
Omit the glipizide dose on the nights you work  And have a starchless dinner (bread, pasta, rice, or potato)   Your diabetes and cholesterol are under excellent control.    Pnuemonovax given today  Get your Flu shot  Return in 3 months for your welcome to medicare exam

## 2012-03-05 ENCOUNTER — Encounter: Payer: Self-pay | Admitting: Internal Medicine

## 2012-03-05 DIAGNOSIS — K2970 Gastritis, unspecified, without bleeding: Secondary | ICD-10-CM | POA: Insufficient documentation

## 2012-03-05 LAB — HM PAP SMEAR: HM Pap smear: NORMAL

## 2012-03-05 NOTE — Assessment & Plan Note (Signed)
Well controlled, with hypoglycemic episodes occurring durring 12 hour shift as [employer].  Advised to suspend her glucotrol dose on the days she works to prevent recurrence.

## 2012-03-05 NOTE — Assessment & Plan Note (Signed)
Noncardiac, chronic ,  Normal cath 2007.  Normal GI workup except for gastric erythema. resoled with use of Dexilant.

## 2012-03-05 NOTE — Assessment & Plan Note (Signed)
New onset by former PCP last year.  Stool was checked for blood and negative. Last colonoscopy 7 yrs ago at [facility]

## 2012-03-05 NOTE — Assessment & Plan Note (Addendum)
Addressing obesity and hyperlipidemia with diet and statins.  LFTS are normal.

## 2012-03-07 ENCOUNTER — Encounter: Payer: Self-pay | Admitting: Obstetrics & Gynecology

## 2012-03-09 ENCOUNTER — Telehealth: Payer: Self-pay | Admitting: *Deleted

## 2012-03-09 NOTE — Telephone Encounter (Signed)
Message copied by Gerome Apley on Wed Mar 09, 2012 10:15 AM ------      Message from: Willodean Rosenthal      Created: Wed Mar 09, 2012  9:40 AM       Please notify pt of the need for a repeat Pap with HPV  in 1 year.  Thx.            [id number]

## 2012-03-09 NOTE — Telephone Encounter (Signed)
Called [facility]'s home number and left a message we are calling with some non- urgent information from your doctor, we will call back or you can call office.

## 2012-03-09 NOTE — Telephone Encounter (Signed)
Called [facility]'s mobile phone number and left  A message also that we are calling with some non-urgent information from the clinic, please call the clinic.

## 2012-03-10 NOTE — Telephone Encounter (Signed)
03/09/12 1700- Pt called and stated that she was returning our call.   Called pt and informed her that her pap results are normal and she would need to have a repeat pap with HPV in one year.  Pt stated understanding and did not have any further questions.

## 2012-03-14 LAB — HM MAMMOGRAPHY

## 2012-03-18 ENCOUNTER — Other Ambulatory Visit: Payer: Self-pay | Admitting: Internal Medicine

## 2012-04-08 LAB — HM DIABETES EYE EXAM: HM Diabetic Eye Exam: NORMAL

## 2012-06-14 ENCOUNTER — Ambulatory Visit (INDEPENDENT_AMBULATORY_CARE_PROVIDER_SITE_OTHER): Payer: 59 | Admitting: Internal Medicine

## 2012-06-14 ENCOUNTER — Encounter: Payer: Self-pay | Admitting: Internal Medicine

## 2012-06-14 VITALS — BP 120/82 | HR 65 | Temp 97.5°F | Resp 16 | Ht 66.5 in | Wt 201.5 lb

## 2012-06-14 DIAGNOSIS — E119 Type 2 diabetes mellitus without complications: Secondary | ICD-10-CM

## 2012-06-14 DIAGNOSIS — E1169 Type 2 diabetes mellitus with other specified complication: Secondary | ICD-10-CM

## 2012-06-14 DIAGNOSIS — R5381 Other malaise: Secondary | ICD-10-CM

## 2012-06-14 DIAGNOSIS — Z0000 Encounter for general adult medical examination without abnormal findings: Secondary | ICD-10-CM

## 2012-06-14 DIAGNOSIS — K7689 Other specified diseases of liver: Secondary | ICD-10-CM

## 2012-06-14 DIAGNOSIS — R079 Chest pain, unspecified: Secondary | ICD-10-CM

## 2012-06-14 DIAGNOSIS — K760 Fatty (change of) liver, not elsewhere classified: Secondary | ICD-10-CM

## 2012-06-14 DIAGNOSIS — E669 Obesity, unspecified: Secondary | ICD-10-CM

## 2012-06-14 DIAGNOSIS — R5383 Other fatigue: Secondary | ICD-10-CM

## 2012-06-14 LAB — CBC WITH DIFFERENTIAL/PLATELET
Basophils Absolute: 0 10*3/uL (ref 0.0–0.1)
Basophils Relative: 0.7 % (ref 0.0–3.0)
Eosinophils Absolute: 0.1 10*3/uL (ref 0.0–0.7)
Eosinophils Relative: 1.4 % (ref 0.0–5.0)
HCT: 45.2 % (ref 36.0–46.0)
Hemoglobin: 15.1 g/dL — ABNORMAL HIGH (ref 12.0–15.0)
Lymphocytes Relative: 29.3 % (ref 12.0–46.0)
Lymphs Abs: 1.7 10*3/uL (ref 0.7–4.0)
MCHC: 33.5 g/dL (ref 30.0–36.0)
MCV: 89.5 fl (ref 78.0–100.0)
Monocytes Absolute: 0.4 10*3/uL (ref 0.1–1.0)
Monocytes Relative: 6.8 % (ref 3.0–12.0)
Neutro Abs: 3.5 10*3/uL (ref 1.4–7.7)
Neutrophils Relative %: 61.8 % (ref 43.0–77.0)
Platelets: 228 10*3/uL (ref 150.0–400.0)
RBC: 5.05 Mil/uL (ref 3.87–5.11)
RDW: 13.4 % (ref 11.5–14.6)
WBC: 5.7 10*3/uL (ref 4.5–10.5)

## 2012-06-14 LAB — MICROALBUMIN / CREATININE URINE RATIO
Creatinine,U: 45.9 mg/dL
Microalb Creat Ratio: 0.4 mg/g (ref 0.0–30.0)
Microalb, Ur: 0.2 mg/dL (ref 0.0–1.9)

## 2012-06-14 LAB — LDL CHOLESTEROL, DIRECT: Direct LDL: 77.8 mg/dL

## 2012-06-14 LAB — COMPREHENSIVE METABOLIC PANEL
ALT: 25 U/L (ref 0–35)
AST: 33 U/L (ref 0–37)
Albumin: 4.1 g/dL (ref 3.5–5.2)
Alkaline Phosphatase: 89 U/L (ref 39–117)
BUN: 17 mg/dL (ref 6–23)
CO2: 28 mEq/L (ref 19–32)
Calcium: 9.3 mg/dL (ref 8.4–10.5)
Chloride: 103 mEq/L (ref 96–112)
Creatinine, Ser: 0.8 mg/dL (ref 0.4–1.2)
GFR: 78.71 mL/min (ref >60.00)
Glucose, Bld: 105 mg/dL — ABNORMAL HIGH (ref 70–99)
Potassium: 4.4 mEq/L (ref 3.5–5.1)
Sodium: 138 mEq/L (ref 135–145)
Total Bilirubin: 1 mg/dL (ref 0.3–1.2)
Total Protein: 7.6 g/dL (ref 6.0–8.3)

## 2012-06-14 LAB — TSH: TSH: 1.43 u[IU]/mL (ref 0.35–5.50)

## 2012-06-14 LAB — HEMOGLOBIN A1C: Hgb A1c MFr Bld: 7.2 % — ABNORMAL HIGH (ref 4.6–6.5)

## 2012-06-14 MED ORDER — ATENOLOL 50 MG PO TABS: 50.0000 mg | ORAL_TABLET | Freq: Every day | ORAL | Status: DC

## 2012-06-14 MED ORDER — HYOSCYAMINE SULFATE 0.125 MG PO TABS: 0.1250 mg | ORAL_TABLET | ORAL | Status: DC | PRN

## 2012-06-14 MED ORDER — ROSUVASTATIN CALCIUM 20 MG PO TABS: 20.0000 mg | ORAL_TABLET | Freq: Every day | ORAL | Status: DC

## 2012-06-14 MED ORDER — LOSARTAN POTASSIUM 50 MG PO TABS: 50.0000 mg | ORAL_TABLET | Freq: Every day | ORAL | Status: DC

## 2012-06-14 MED ORDER — PANTOPRAZOLE SODIUM 40 MG PO TBEC: 40.0000 mg | DELAYED_RELEASE_TABLET | Freq: Every day | ORAL | Status: DC

## 2012-06-14 MED ORDER — GLIPIZIDE 5 MG PO TABS: 2.5000 mg | ORAL_TABLET | Freq: Two times a day (BID) | ORAL | Status: DC

## 2012-06-14 NOTE — Patient Instructions (Addendum)
Call Dr Fransico Michael to set up march eye exam for diabetes   continue annual skin exams with Dr. Roseanne Kaufman   Your fatigue may be due to the atenolol.  We are going to change your bp medications.  Reduce your atenolol dose to 1/2 daily for one week,  Then stop.  Start the losartan tablet once daily in one week. You may start early if bp > 150 on reduced atenolol dose.

## 2012-06-14 NOTE — Progress Notes (Signed)
Patient ID: [name], female   DOB: 01/05/1946, 67 y.o.   MRN: [id number]   The patient is here for hr "Welcome to Medicare " examination and management of other chronic and acute problems..  She is up to date with screening for cervical  And breast CA which is done by her gynecologist.  Sh eis up to date with colon CA screening with colonoscopy done at [facility].     The risk factors are reflected in the social history.  The roster of all physicians providing medical care to patient - is listed in the Snapshot section of the chart.  Activities of daily living:  The patient is 100% independent in all ADLs: dressing, toileting, feeding as well as independent mobility  Home safety : The patient has smoke detectors in the home. They wear seatbelts.  There are no firearms at home. There is no violence in the home.   There is no risks for hepatitis, STDs or HIV. There is no   history of blood transfusion. They have no travel history to infectious disease endemic areas of the world.  The patient has seen their dentist in the last six month. They have seen their eye doctor in the last year. They admit to slight hearing difficulty with regard to whispered voices and some television programs.  They have deferred audiologic testing in the last year.  They do not  have excessive sun exposure. Discussed the need for sun protection: hats, long sleeves and use of sunscreen if there is significant sun exposure.   Diet: the importance of a healthy diet is discussed. They do have a healthy diet.  The benefits of regular aerobic exercise were discussed. She exercises 4 times per week ,  20 minutes.   Depression screen: there are no signs or vegative symptoms of depression- irritability, change in appetite, anhedonia, sadness/tearfullness.  Cognitive assessment: the patient manages all their financial and personal affairs and is actively engaged. They could relate day,date,year and events; recalled 2/3 objects at 3  minutes; performed clock-face test normally.  The following portions of the patient's history were reviewed and updated as appropriate: allergies, current medications, past family history, past medical history,  past surgical history, past social history  and problem list.  Visual acuity was not assessed per patient preference since she has regular follow up with her ophthalmologist. Hearing and body mass index were assessed and reviewed.   During the course of the visit the patient was educated and counseled about appropriate screening and preventive services including : fall prevention ,  nutrition counseling, and recommended immunizations.    BP 120/82  Pulse 65  Temp 97.5 F (36.4 C) (Oral)  Resp 16  Ht 5' 6.5" (1.689 m)  Wt 201 lb 8 oz (91.4 kg)  BMI 32.04 kg/m2  SpO2 98%  General Appearance:    Alert, cooperative, no distress, appears stated age  Head:    Normocephalic, without obvious abnormality, atraumatic  Eyes:    PERRL, conjunctiva/corneas clear, EOM's intact, fundi    benign, both eyes  Ears:    Normal TM's and external ear canals, both ears  Nose:   Nares normal, septum midline, mucosa normal, no drainage    or sinus tenderness  Throat:   Lips, mucosa, and tongue normal; teeth and gums normal  Neck:   Supple, symmetrical, trachea midline, no adenopathy;    thyroid:  no enlargement/tenderness/nodules; no carotid   bruit or JVD  Back:     Symmetric, no curvature,  ROM normal, no CVA tenderness  Lungs:     Clear to auscultation bilaterally, respirations unlabored  Chest Wall:    No tenderness or deformity   Heart:    Regular rate and rhythm, S1 and S2 normal, no murmur, rub   or gallop  Breast Exam:    Deferred, done by GYN   Abdomen:     Soft, non-tender, bowel sounds active all four quadrants,    no masses, no organomegaly  Genitalia:    Deferred, done in Oct  by GYN       Extremities:   Extremities normal, atraumatic, no cyanosis or edema  Pulses:   2+ and  symmetric all extremities  Skin:   Skin color, texture, turgor normal, no rashes or lesions  Lymph nodes:   Cervical, supraclavicular, and axillary nodes normal  Neurologic:   CNII-XII intact, normal strength, sensation and reflexes    throughout   Assessment and Plan  Diabetes mellitus type 2 in obese Her control has slipped,  a1c is 7.2  .  Will reinforce low GI diet as a way of life and recheck in 3 months. Reminder for annual diabetic eye exam given..  Foot exam done. Meds reviewed and she is on a baby aspirin daily., a statin and an ACE inhibitor.  Urine tested for protein.    Chest pain at rest Infrequent, not occurring with exercise.  Noncardiac by recent cardiology evaluation.  Has relief with  hyoscyamine for presumed esophageal spasm.   Fatty liver disease, nonalcoholic Presumed by ultrasound changes and serologies negative for autoimmune causes of hepatitis.  Current liver enzymes are normal and all modifiable risk factors including obesity, diabetes and hyperlipidemia have been addressed    Updated Medication List Outpatient Encounter Prescriptions as of 06/14/2012  Medication Sig Dispense Refill  . aspirin 81 MG tablet Take 81 mg by mouth daily.        . Calcium Carbonate-Vitamin D (CALCIUM + D) 600-200 MG-UNIT TABS Take by mouth 3 (three) times daily.        . fish oil-omega-3 fatty acids 1000 MG capsule Take 2 g by mouth 2 (two) times daily.        Marland Kitchen glipiZIDE (GLUCOTROL) 5 MG tablet Take 0.5 tablets (2.5 mg total) by mouth 2 (two) times daily before a meal.  90 tablet  3  . Multiple Vitamin (MULTIVITAMIN) tablet Take 1 tablet by mouth daily.      . nitroGLYCERIN (NITROSTAT) 0.4 MG SL tablet Place 1 tablet (0.4 mg total) under the tongue every 5 (five) minutes as needed for chest pain.  25 tablet  3  . rosuvastatin (CRESTOR) 20 MG tablet Take 1 tablet (20 mg total) by mouth daily.  90 tablet  3  . vitamin C (ASCORBIC ACID) 500 MG tablet Take 500 mg by mouth daily.      .  vitamin E 100 UNIT capsule Take 100 Units by mouth daily.      . [DISCONTINUED] atenolol (TENORMIN) 50 MG tablet Take 1 tablet (50 mg total) by mouth daily.  90 tablet  3  . [DISCONTINUED] atenolol (TENORMIN) 50 MG tablet Take 1 tablet (50 mg total) by mouth daily.  90 tablet  3  . [DISCONTINUED] glipiZIDE (GLUCOTROL) 5 MG tablet Take 0.5 tablets (2.5 mg total) by mouth 2 (two) times daily before a meal.  90 tablet  11  . [DISCONTINUED] rosuvastatin (CRESTOR) 20 MG tablet Take 1 tablet (20 mg total) by mouth daily.  90 tablet  3  . dexlansoprazole (DEXILANT) 60 MG capsule Take 1 capsule (60 mg total) by mouth daily.  90 capsule  3  . Famotidine-Ca Carb-Mag Hydrox (PEPCID COMPLETE PO) Take by mouth as needed      . hyoscyamine (LEVSIN, ANASPAZ) 0.125 MG tablet Take 1 tablet (0.125 mg total) by mouth every 4 (four) hours as needed.  30 tablet  1  . losartan (COZAAR) 50 MG tablet Take 1 tablet (50 mg total) by mouth daily.  90 tablet  3  . pantoprazole (PROTONIX) 40 MG tablet Take 1 tablet (40 mg total) by mouth daily.  90 tablet  3  . polyethylene glycol powder (GLYCOLAX/MIRALAX) powder       . [DISCONTINUED] hyoscyamine (LEVSIN, ANASPAZ) 0.125 MG tablet Take 0.125 mg by mouth every 4 (four) hours as needed.

## 2012-06-15 NOTE — Assessment & Plan Note (Signed)
Infrequent, not occurring with exercise.  Noncardiac by recent cardiology evaluation.  Has relief with  hyoscyamine for presumed esophageal spasm.

## 2012-06-15 NOTE — Assessment & Plan Note (Signed)
Presumed by ultrasound changes and serologies negative for autoimmune causes of hepatitis.  Current liver enzymes are normal and all modifiable risk factors including obesity, diabetes and hyperlipidemia have been addressed  

## 2012-06-15 NOTE — Assessment & Plan Note (Addendum)
Her control has slipped,  a1c is 7.2  .  Will reinforce low GI diet as a way of life and recheck in 3 months. Reminder for annual diabetic eye exam given..  Foot exam done. Meds reviewed and she is on a baby aspirin daily., a statin and an ACE inhibitor.  Urine tested for protein.

## 2012-06-16 ENCOUNTER — Telehealth: Payer: Self-pay | Admitting: Internal Medicine

## 2012-06-16 NOTE — Telephone Encounter (Signed)
Pt returning call regarding lab results 

## 2012-06-17 ENCOUNTER — Telehealth: Payer: Self-pay | Admitting: Internal Medicine

## 2012-06-17 NOTE — Telephone Encounter (Signed)
Pt.notified

## 2012-06-17 NOTE — Telephone Encounter (Signed)
Pt notified of results would like these released.

## 2012-06-17 NOTE — Telephone Encounter (Signed)
Pt came in and wanted you to send her labs through [employer]

## 2012-06-17 NOTE — Telephone Encounter (Signed)
Her labs will be released in 2 days,  If not ,  She needs to call the help line. I have no way to do it manually from here. You can send her a copy in the mail.

## 2012-07-13 ENCOUNTER — Other Ambulatory Visit: Payer: Self-pay | Admitting: Internal Medicine

## 2012-08-03 ENCOUNTER — Other Ambulatory Visit: Payer: Self-pay | Admitting: Internal Medicine

## 2012-08-03 ENCOUNTER — Other Ambulatory Visit (INDEPENDENT_AMBULATORY_CARE_PROVIDER_SITE_OTHER): Payer: 59

## 2012-08-03 DIAGNOSIS — Z1211 Encounter for screening for malignant neoplasm of colon: Secondary | ICD-10-CM

## 2012-08-03 LAB — FECAL OCCULT BLOOD, IMMUNOCHEMICAL: Fecal Occult Bld: NEGATIVE

## 2012-08-04 ENCOUNTER — Encounter: Payer: Self-pay | Admitting: Internal Medicine

## 2012-08-06 LAB — FECAL OCCULT BLOOD, GUAIAC: Fecal Occult Blood: NEGATIVE

## 2012-08-08 ENCOUNTER — Encounter: Payer: Self-pay | Admitting: General Practice

## 2012-09-06 LAB — HM DIABETES EYE EXAM: HM Diabetic Eye Exam: NORMAL

## 2012-09-10 ENCOUNTER — Telehealth: Payer: Self-pay | Admitting: Internal Medicine

## 2012-09-10 DIAGNOSIS — E119 Type 2 diabetes mellitus without complications: Secondary | ICD-10-CM

## 2012-09-10 NOTE — Telephone Encounter (Signed)
[name] needs to be scheduled for a followup appointment for her diabetes. Please ask her to make a lab appointment first to get her blood done. This will need to be fasting.

## 2012-09-12 NOTE — Telephone Encounter (Signed)
Labs 5/9  Office visit 5/22  Pt aware of appointment

## 2012-09-14 ENCOUNTER — Other Ambulatory Visit: Payer: 59

## 2012-09-23 ENCOUNTER — Encounter: Payer: Self-pay | Admitting: Internal Medicine

## 2012-09-23 ENCOUNTER — Other Ambulatory Visit (INDEPENDENT_AMBULATORY_CARE_PROVIDER_SITE_OTHER): Payer: 59

## 2012-09-23 DIAGNOSIS — E119 Type 2 diabetes mellitus without complications: Secondary | ICD-10-CM

## 2012-09-23 LAB — COMPREHENSIVE METABOLIC PANEL
ALT: 23 U/L (ref 0–35)
AST: 28 U/L (ref 0–37)
Albumin: 3.8 g/dL (ref 3.5–5.2)
Alkaline Phosphatase: 81 U/L (ref 39–117)
BUN: 15 mg/dL (ref 6–23)
CO2: 26 mEq/L (ref 19–32)
Calcium: 8.9 mg/dL (ref 8.4–10.5)
Chloride: 103 mEq/L (ref 96–112)
Creatinine, Ser: 0.9 mg/dL (ref 0.4–1.2)
GFR: 71.22 mL/min (ref >60.00)
Glucose, Bld: 146 mg/dL — ABNORMAL HIGH (ref 70–99)
Potassium: 4.5 mEq/L (ref 3.5–5.1)
Sodium: 137 mEq/L (ref 135–145)
Total Bilirubin: 1 mg/dL (ref 0.3–1.2)
Total Protein: 6.9 g/dL (ref 6.0–8.3)

## 2012-09-23 LAB — LIPID PANEL
Cholesterol: 140 mg/dL (ref 0–200)
HDL: 44 mg/dL (ref >39.00)
LDL Cholesterol: 76 mg/dL (ref 0–99)
Total CHOL/HDL Ratio: 3
Triglycerides: 98 mg/dL (ref 0.0–149.0)
VLDL: 19.6 mg/dL (ref 0.0–40.0)

## 2012-09-23 LAB — HEMOGLOBIN A1C: Hgb A1c MFr Bld: 7 % — ABNORMAL HIGH (ref 4.6–6.5)

## 2012-10-06 ENCOUNTER — Encounter: Payer: Self-pay | Admitting: Internal Medicine

## 2012-10-06 ENCOUNTER — Ambulatory Visit (INDEPENDENT_AMBULATORY_CARE_PROVIDER_SITE_OTHER): Payer: 59 | Admitting: Internal Medicine

## 2012-10-06 VITALS — BP 118/76 | HR 71 | Temp 97.8°F | Resp 16 | Wt 197.8 lb

## 2012-10-06 DIAGNOSIS — E119 Type 2 diabetes mellitus without complications: Secondary | ICD-10-CM

## 2012-10-06 DIAGNOSIS — E785 Hyperlipidemia, unspecified: Secondary | ICD-10-CM

## 2012-10-06 DIAGNOSIS — E1169 Type 2 diabetes mellitus with other specified complication: Secondary | ICD-10-CM

## 2012-10-06 DIAGNOSIS — E669 Obesity, unspecified: Secondary | ICD-10-CM

## 2012-10-06 DIAGNOSIS — I10 Essential (primary) hypertension: Secondary | ICD-10-CM

## 2012-10-06 LAB — HM DIABETES FOOT EXAM: HM Diabetic Foot Exam: NORMAL

## 2012-10-06 MED ORDER — GLUCOSE BLOOD VI STRP: ORAL_STRIP | Status: DC

## 2012-10-06 NOTE — Patient Instructions (Addendum)
Your diabetes is now under control again!!  Good job .   You need to lose 20 lbs to get your BMI below 30 .   You can do this over 6 months   This is  my version of a  "Low GI"  Diet:  It is not ultra low carb, but will still lower your blood sugars and allow you to lose 5 to 10 lbs per month if you follow it carefully. All of the foods can be found at grocery stores and in bulk at [employer].  The Atkins protein bars and shakes are available in more varieties at [employer], [employer] and [employer].     7 AM Breakfast:  Low carbohydrate Protein  Shakes (I recommend the EAS AdvantEdge "Carb Control" shakes  Or the low carb shakes by Atkins.   Both are available everywhere:  In  cases at [employer]Js  Or in 4 packs at grocery stores and pharmacies  2.5 carbs  (Alternative is  a toasted Arnold's Sandwhich Thin w/ peanut butter, a "Bagel Thin" with cream cheese and salmon) or  a scrambled egg burrito made with a low carb tortilla .  Avoid cereal and bananas, oatmeal too unless you are cooking the old fashioned kind that takes 30-40 minutes to prepare.  the rest is overly processed, has minimal fiber, and is loaded with carbohydrates!   10 AM: Protein bar by Atkins (the snack size, under 200 cal).  There are many varieties , available widely again or in bulk in limited varieties at BJs)  Other so called "protein bars" tend to be loaded with carbohydrates.  Remember, in food advertising, the word "energy" is synonymous for " carbohydrate."  Lunch: sandwich of [place], (or any lunchmeat, grilled meat or canned tuna), fresh avocado, mayonnaise  and cheese on a lower carbohydrate pita bread, flatbread, or tortilla . Ok to use regular mayonnaise. The bread is the only source or carbohydrate that can be decreased (Joseph's makes a pita bread and a flat bread that are 50 cal and 4 net carbs ; Toufayan makes a low carb flatbread that's 100 cal and 9 net carbs  and  Mission makes a low carb whole wheat tortilla  That is 210 cal and  6 net carbs)  3 PM:  Mid day :  Another protein bar,  Or a  cheese stick (100 cal, 0 carbs),  Or 1 ounce of  almonds, walnuts, pistachios, pecans, peanuts,  Macadamia nuts. Or a Dannon light n Fit greek yogurt, 80 cal 8 net carbs . Avoid "granola"; the dried cranberries and raisins are loaded with carbohydrates. Mixed nuts ok if no raisins or cranberries or dried fruit.      6 PM  Dinner:  "mean and green:"  Meat/chicken/fish or a high protein legume; , with a green salad, and a low GI  Veggie (broccoli, cauliflower, green beans, spinach, brussel sprouts. Lima beans) : Avoid "Low fat dressings, as well as Reyne Dumas and [address]! They are loaded with sugar! Instead use ranch, vinagrette,  Blue cheese, etc.  There is a low carb pasta by Dreamfield's available at [facility] that is acceptable and tastes great. Try Michel Angel's chicken piccata over low carb pasta. The chicken dish is 0 carbs, and can be found in frozen section at BJs and [employer]'s. Also try [employer] "Carnitas" (pulled pork, no sauce,  0 carbs) and his pot roast.   both are in the refrigerated section at BJs   Dreamfield's  makes a low carb pasta only 5 g/serving.  Available at all grocery stores,  And tastes like normal pasta  9 PM snack : Breyer's "low carb" fudgsicle or  ice cream bar (Carb Smart line), or  Weight Watcher's ice cream bar , or another "no sugar added" ice cream;a serving of fresh berries/cherries with whipped cream (Avoid bananas, pineapple, grapes  and watermelon on a regular basis because they are high in sugar)   Remember that snack Substitutions should be less than 10 carbs per serving and meals < 20 carbs. Remember to subtract fiber grams and sugar alcohols to get the "net carbs."

## 2012-10-08 ENCOUNTER — Encounter: Payer: Self-pay | Admitting: Internal Medicine

## 2012-10-08 NOTE — Assessment & Plan Note (Signed)
Well controlled on current regimen.  no changes today.   

## 2012-10-08 NOTE — Progress Notes (Signed)
Patient ID: [name], female   DOB: 04-24-1946, 67 y.o.   MRN: [id number]     Patient Active Problem List   Diagnosis Date Noted  . Gastritis   . Fatty liver disease, nonalcoholic 07/07/2011  . Chest pain at rest 07/07/2011  . Iron deficiency anemia 02/11/2011  . Diabetes mellitus type 2 in obese 02/11/2011  . Hypertension 02/11/2011  . Nausea 02/11/2011  . Hyperlipidemia     Subjective:  CC:   Chief Complaint  Patient presents with  . Follow-up    HPI:   [name] a 67 y.o. female who presents 3 month follow up on diabetes mellitus and hypertension .  Her blood sugars have been normal when checked fasting and post prandial .  She is exercising regularly but having difficulty maintaining a low glycemic index regularly.  Tolerating medications.    Past Medical History  Diagnosis Date  . Hyperlipidemia     Lipitor trial was a failure  . Hypertension   . Diabetes mellitus   . High cholesterol   . Gastritis     Past Surgical History  Procedure Laterality Date  . Spine surgery  1995  . Tubal ligation    . Spine surgery      bone graft   . Cardiac catheterization  Augus 2007    normal coronaries,  EF 60%       The following portions of the patient's history were reviewed and updated as appropriate: Allergies, current medications, and problem list.    Review of Systems:   Patient denies headache, fevers, malaise, unintentional weight loss, skin rash, eye pain, sinus congestion and sinus pain, sore throat, dysphagia,  hemoptysis , cough, dyspnea, wheezing, chest pain, palpitations, orthopnea, edema, abdominal pain, nausea, melena, diarrhea, constipation, flank pain, dysuria, hematuria, urinary  Frequency, nocturia, numbness, tingling, seizures,  Focal weakness, Loss of consciousness,  Tremor, insomnia, depression, anxiety, and suicidal ideation.      History   Social History  . Marital Status: Married    Spouse Name: N/A    Number of Children: N/A  .  Years of Education: N/A   Occupational History  . Not on file.   Social History Main Topics  . Smoking status: Never Smoker   . Smokeless tobacco: Never Used  . Alcohol Use: No  . Drug Use: No  . Sexually Active: Yes -- Female partner(s)   Other Topics Concern  . Not on file   Social History Narrative  . No narrative on file    Objective:  BP 118/76  Pulse 71  Temp(Src) 97.8 F (36.6 C) (Oral)  Resp 16  Wt 197 lb 12 oz (89.699 kg)  BMI 31.44 kg/m2  SpO2 98%  General appearance: alert, cooperative and appears stated age Ears: normal TM's and external ear canals both ears Throat: lips, mucosa, and tongue normal; teeth and gums normal Neck: no adenopathy, no carotid bruit, supple, symmetrical, trachea midline and thyroid not enlarged, symmetric, no tenderness/mass/nodules Back: symmetric, no curvature. ROM normal. No CVA tenderness. Lungs: clear to auscultation bilaterally Heart: regular rate and rhythm, S1, S2 normal, no murmur, click, rub or gallop Abdomen: soft, non-tender; bowel sounds normal; no masses,  no organomegaly Pulses: 2+ and symmetric Skin: Skin color, texture, turgor normal. No rashes or lesions Lymph nodes: Cervical, supraclavicular, and axillary nodes normal.  Assessment and Plan:  Hypertension Well controlled on current regimen. Renal function stable, no changes today.  Diabetes mellitus type 2 in obese Well-controlled on current medications.  hemoglobin A1c is  7.0 . He is up-to-date on eye exams and his foot exam is norma. Normal urine microalbumin to creatinine ratio. She is on the appropriate medications.  Hyperlipidemia Well controlled on current regimen.  no changes today.    Updated Medication List Outpatient Encounter Prescriptions as of 10/06/2012  Medication Sig Dispense Refill  . aspirin 81 MG tablet Take 81 mg by mouth daily.        . Calcium Carbonate-Vitamin D (CALCIUM + D) 600-200 MG-UNIT TABS Take by mouth 3 (three) times daily.         . fish oil-omega-3 fatty acids 1000 MG capsule Take 2 g by mouth 2 (two) times daily.        Marland Kitchen glipiZIDE (GLUCOTROL) 5 MG tablet TAKE ONE-HALF (1/2) TABLET TWICE DAILY BEFORE MEALS  90 tablet  3  . hyoscyamine (LEVSIN, ANASPAZ) 0.125 MG tablet Take 1 tablet (0.125 mg total) by mouth every 4 (four) hours as needed.  30 tablet  1  . losartan (COZAAR) 50 MG tablet Take 1 tablet (50 mg total) by mouth daily.  90 tablet  3  . Multiple Vitamin (MULTIVITAMIN) tablet Take 1 tablet by mouth daily.      . pantoprazole (PROTONIX) 40 MG tablet Take 1 tablet (40 mg total) by mouth daily.  90 tablet  3  . polyethylene glycol powder (GLYCOLAX/MIRALAX) powder       . rosuvastatin (CRESTOR) 20 MG tablet Take 1 tablet (20 mg total) by mouth daily.  90 tablet  3  . vitamin C (ASCORBIC ACID) 500 MG tablet Take 500 mg by mouth daily.      . vitamin E 100 UNIT capsule Take 100 Units by mouth daily.      . Famotidine-Ca Carb-Mag Hydrox (PEPCID COMPLETE PO) Take by mouth as needed      . glucose blood (FREESTYLE LITE) test strip Use to check blood sugars three times daily  100 each  12  . hyoscyamine (LEVSIN/SL) 0.125 MG SL tablet Place 1 tablet (0.125 mg total) under the tongue every 4 (four) hours as needed for cramping.  30 tablet  0  . [DISCONTINUED] dexlansoprazole (DEXILANT) 60 MG capsule Take 1 capsule (60 mg total) by mouth daily.  90 capsule  3   No facility-administered encounter medications on file as of 10/06/2012.     Orders Placed This Encounter  Procedures  . HM DIABETES FOOT EXAM    Return in about 3 months (around 01/06/2013).

## 2012-10-08 NOTE — Assessment & Plan Note (Signed)
Well-controlled on current medications.  hemoglobin A1c is  7.0 . He is up-to-date on eye exams and his foot exam is norma. Normal urine microalbumin to creatinine ratio. She is on the appropriate medications.

## 2012-10-08 NOTE — Assessment & Plan Note (Signed)
Well controlled on current regimen. Renal function stable, no changes today. 

## 2012-10-31 ENCOUNTER — Telehealth: Payer: Self-pay

## 2012-10-31 ENCOUNTER — Telehealth: Payer: Self-pay | Admitting: Internal Medicine

## 2012-10-31 ENCOUNTER — Encounter: Payer: Self-pay | Admitting: Internal Medicine

## 2012-10-31 NOTE — Telephone Encounter (Signed)
Patient stated if she feels this way again she will go to ER as advised.

## 2012-10-31 NOTE — Telephone Encounter (Signed)
The elevated bp was probably due to the symptoms of viral gastroenteritis.  Difficult to say or diagnose over the phone ,  Since her current bp  looks better.  HOwever if patient is having emesis or diarrhea will call something in for her rather than making her come in ,

## 2012-10-31 NOTE — Telephone Encounter (Signed)
Patient Information:  Caller Name: Dinesha  Phone: [phone]  Patient: [name], [name]  Gender: Female  DOB: [birth year]  Age: 67 Years  PCP: Duncan Dull (Adults only)  Office Follow Up:  Does the office need to follow up with this patient?: Yes  Instructions For The Office: F/U with pt today.  RN Note:  Contacted the office and spoke with Montrice and advised she will talk with Dr Darrick Huntsman and call the pt back.  Symptoms  Reason For Call & Symptoms: Pt states she has elevated BP  170/104 10/30/12 and then 145/89 10/31/12.  Reviewed Health History In EMR: Yes  Reviewed Medications In EMR: Yes  Reviewed Allergies In EMR: Yes  Reviewed Surgeries / Procedures: Yes  Date of Onset of Symptoms: 10/30/2012  Guideline(s) Used:  High Blood Pressure  Disposition Per Guideline:   See Within 2 Weeks in Office  Reason For Disposition Reached:   BP > 160/100  Advice Given:  Call Back If:  Headache, blurred vision, difficulty talking, or difficulty walking occurs  Chest pain or difficulty breathing occurs  You become worse.  Patient Will Follow Care Advice:  YES

## 2012-10-31 NOTE — Telephone Encounter (Signed)
Please Advise....   Erma from call a nurse, wanted Dr. Darrick Huntsman to know that the patient has been feeling dizzy, nausea, had a fever, and her blood pressure last night was 170/104 and they sent her home from work. Today patient checked her blood pressure and the highest it was today was 145/89.

## 2012-11-21 ENCOUNTER — Telehealth: Payer: Self-pay | Admitting: Internal Medicine

## 2012-11-21 DIAGNOSIS — E1059 Type 1 diabetes mellitus with other circulatory complications: Secondary | ICD-10-CM

## 2012-11-21 DIAGNOSIS — E119 Type 2 diabetes mellitus without complications: Secondary | ICD-10-CM

## 2012-11-21 DIAGNOSIS — E039 Hypothyroidism, unspecified: Secondary | ICD-10-CM

## 2012-11-21 NOTE — Telephone Encounter (Signed)
Patient wanting labs done before her appointment on 8.22.14.

## 2012-11-22 NOTE — Telephone Encounter (Signed)
Labs ordered.

## 2013-01-04 ENCOUNTER — Other Ambulatory Visit (INDEPENDENT_AMBULATORY_CARE_PROVIDER_SITE_OTHER): Payer: 59

## 2013-01-04 ENCOUNTER — Encounter: Payer: Self-pay | Admitting: Internal Medicine

## 2013-01-04 DIAGNOSIS — E119 Type 2 diabetes mellitus without complications: Secondary | ICD-10-CM

## 2013-01-04 DIAGNOSIS — E039 Hypothyroidism, unspecified: Secondary | ICD-10-CM

## 2013-01-04 LAB — TSH: TSH: 2.5 u[IU]/mL (ref 0.35–5.50)

## 2013-01-04 LAB — LIPID PANEL
Cholesterol: 143 mg/dL (ref 0–200)
HDL: 41.8 mg/dL (ref >39.00)
LDL Cholesterol: 80 mg/dL (ref 0–99)
Total CHOL/HDL Ratio: 3
Triglycerides: 104 mg/dL (ref 0.0–149.0)
VLDL: 20.8 mg/dL (ref 0.0–40.0)

## 2013-01-04 LAB — COMPREHENSIVE METABOLIC PANEL
ALT: 20 U/L (ref 0–35)
AST: 25 U/L (ref 0–37)
Albumin: 3.8 g/dL (ref 3.5–5.2)
Alkaline Phosphatase: 77 U/L (ref 39–117)
BUN: 17 mg/dL (ref 6–23)
CO2: 27 mEq/L (ref 19–32)
Calcium: 9.2 mg/dL (ref 8.4–10.5)
Chloride: 102 mEq/L (ref 96–112)
Creatinine, Ser: 0.8 mg/dL (ref 0.4–1.2)
GFR: 77.43 mL/min (ref >60.00)
Glucose, Bld: 147 mg/dL — ABNORMAL HIGH (ref 70–99)
Potassium: 4.8 mEq/L (ref 3.5–5.1)
Sodium: 137 mEq/L (ref 135–145)
Total Bilirubin: 0.7 mg/dL (ref 0.3–1.2)
Total Protein: 6.8 g/dL (ref 6.0–8.3)

## 2013-01-04 LAB — HEMOGLOBIN A1C: Hgb A1c MFr Bld: 6.9 % — ABNORMAL HIGH (ref 4.6–6.5)

## 2013-01-04 LAB — MICROALBUMIN / CREATININE URINE RATIO
Creatinine,U: 86.2 mg/dL
Microalb Creat Ratio: 0.2 mg/g (ref 0.0–30.0)
Microalb, Ur: 0.2 mg/dL (ref 0.0–1.9)

## 2013-01-06 ENCOUNTER — Ambulatory Visit (INDEPENDENT_AMBULATORY_CARE_PROVIDER_SITE_OTHER): Payer: 59 | Admitting: Internal Medicine

## 2013-01-06 ENCOUNTER — Encounter: Payer: Self-pay | Admitting: Internal Medicine

## 2013-01-06 VITALS — BP 132/72 | HR 75 | Temp 97.7°F | Resp 14 | Wt 198.8 lb

## 2013-01-06 DIAGNOSIS — E1169 Type 2 diabetes mellitus with other specified complication: Secondary | ICD-10-CM

## 2013-01-06 DIAGNOSIS — E785 Hyperlipidemia, unspecified: Secondary | ICD-10-CM

## 2013-01-06 DIAGNOSIS — E119 Type 2 diabetes mellitus without complications: Secondary | ICD-10-CM

## 2013-01-06 DIAGNOSIS — I10 Essential (primary) hypertension: Secondary | ICD-10-CM

## 2013-01-06 DIAGNOSIS — E669 Obesity, unspecified: Secondary | ICD-10-CM

## 2013-01-06 DIAGNOSIS — K760 Fatty (change of) liver, not elsewhere classified: Secondary | ICD-10-CM

## 2013-01-06 DIAGNOSIS — K7689 Other specified diseases of liver: Secondary | ICD-10-CM

## 2013-01-06 LAB — HM DIABETES FOOT EXAM: HM Diabetic Foot Exam: NORMAL

## 2013-01-06 NOTE — Patient Instructions (Addendum)
Your  diabets is well controlled. Your low evening blood sugars are a sign that you do not need the glipizide anymore  Stop your evening glipizide for now and follow your blood sugars .   If you try the EAS or Atkins low carb shake for breakfast, do not take your glipizide that morning!!!

## 2013-01-06 NOTE — Telephone Encounter (Signed)
Mailed unread message to pt  

## 2013-01-08 ENCOUNTER — Encounter: Payer: Self-pay | Admitting: Internal Medicine

## 2013-01-08 NOTE — Assessment & Plan Note (Signed)
LDL and triglycerides are at goal on current medications.she has no side effects and liver enzymes are normal. No changes today  

## 2013-01-08 NOTE — Progress Notes (Signed)
Patient ID: [name], female   DOB: 1945-08-19, 67 y.o.   MRN: [id number]  Patient Active Problem List   Diagnosis Date Noted  . Gastritis   . Fatty liver disease, nonalcoholic 07/07/2011  . Chest pain at rest 07/07/2011  . Iron deficiency anemia 02/11/2011  . Diabetes mellitus type 2 in obese 02/11/2011  . Hypertension 02/11/2011  . Hyperlipidemia     Subjective:  CC:   Chief Complaint  Patient presents with  . Follow-up    3 month    HPI:   [name] a 67 y.o. female who presents Three-month followup on diabetes hypertension hyperlipidemia and obesity. She has been following a lower glycemic index diet but not exercising regularly. She has not gained any weight or lost any weight since her last visit. She has been on vacation. She has had no hypoglycemic events. She is tolerating her medications well. She is up-to-date on eye exams.   Past Medical History  Diagnosis Date  . Hyperlipidemia     Lipitor trial was a failure  . Hypertension   . Diabetes mellitus   . High cholesterol   . Gastritis     Past Surgical History  Procedure Laterality Date  . Spine surgery  1995  . Tubal ligation    . Spine surgery      bone graft   . Cardiac catheterization  Augus 2007    normal coronaries,  EF 60%       The following portions of the patient's history were reviewed and updated as appropriate: Allergies, current medications, and problem list.    Review of Systems:   12 Pt  review of systems was negative except those addressed in the HPI,     History   Social History  . Marital Status: Married    Spouse Name: N/A    Number of Children: N/A  . 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 Smoking status: Never Smoker   . Smokeless tobacco: Never Used  . Alcohol Use: No  . Drug Use: No  . Sexual Activity: Yes    Partners: Male   Other Topics Concern  . Not on file   Social History  Narrative  . No narrative on file    Objective:  Filed Vitals:   01/06/13 0814  BP: 132/72  Pulse: 75  Temp: 97.7 F (36.5 C)  Resp: 14     General appearance: alert, cooperative and appears stated age Ears: normal TM's and external ear canals both ears Throat: lips, mucosa, and tongue normal; teeth and gums normal Neck: no adenopathy, no carotid bruit, supple, symmetrical, trachea midline and thyroid not enlarged, symmetric, no tenderness/mass/nodules Back: symmetric, no curvature. ROM normal. No CVA tenderness. Lungs: clear to auscultation bilaterally Heart: regular rate and rhythm, S1, S2 normal, no murmur, click, rub or gallop Abdomen: soft, non-tender; bowel sounds normal; no masses,  no organomegaly Pulses: 2+ and symmetric Skin: Skin color, texture, turgor normal. No rashes or lesions Lymph nodes: Cervical, supraclavicular, and axillary nodes normal.  Assessment and Plan:  Hyperlipidemia LDL and triglycerides are at goal on current medications. she has no side effects and liver enzymes are normal. No changes today   Diabetes mellitus type 2 in obese Well-controlled on current medications.  hemoglobin A1c is 6.9.  She is up-to-date on eye exams and her foot exam is normal. l we'll repeat his urine microalbumin  to creatinine ratio at next visit. She is on the appropriate medications.  Fatty liver disease, nonalcoholic Secondary to obesity and diabetes/metabolic syndrome.I have addressed  BMI and recommended a low glycemic index diet utilizing smaller more frequent meals to increase metabolism.  I have also recommended that patient start exercising with a goal of 30 minutes of aerobic exercise a minimum of 5 days per week.   Hypertension Well controlled on current regimen. Renal function stable, no changes today.   Updated Medication List Outpatient Encounter Prescriptions as of 01/06/2013  Medication Sig Dispense Refill  . aspirin 81 MG tablet Take 81 mg by mouth  daily.        . Calcium Carbonate-Vitamin D (CALCIUM + D) 600-200 MG-UNIT TABS Take by mouth 3 (three) times daily.        . Famotidine-Ca Carb-Mag Hydrox (PEPCID COMPLETE PO) Take by mouth as needed      . fish oil-omega-3 fatty acids 1000 MG capsule Take 2 g by mouth 2 (two) times daily.        Marland Kitchen glipiZIDE (GLUCOTROL) 5 MG tablet TAKE ONE-HALF (1/2) TABLET TWICE DAILY BEFORE MEALS  90 tablet  3  . glucose blood (FREESTYLE LITE) test strip Use to check blood sugars three times daily  100 each  12  . hyoscyamine (LEVSIN, ANASPAZ) 0.125 MG tablet Take 1 tablet (0.125 mg total) by mouth every 4 (four) hours as needed.  30 tablet  1  . losartan (COZAAR) 50 MG tablet Take 1 tablet (50 mg total) by mouth daily.  90 tablet  3  . Multiple Vitamin (MULTIVITAMIN) tablet Take 1 tablet by mouth daily.      . pantoprazole (PROTONIX) 40 MG tablet Take 1 tablet (40 mg total) by mouth daily.  90 tablet  3  . rosuvastatin (CRESTOR) 20 MG tablet Take 1 tablet (20 mg total) by mouth daily.  90 tablet  3  . vitamin C (ASCORBIC ACID) 500 MG tablet Take 500 mg by mouth daily.      . vitamin E 100 UNIT capsule Take 100 Units by mouth daily.      . polyethylene glycol powder (GLYCOLAX/MIRALAX) powder       . [DISCONTINUED] hyoscyamine (LEVSIN/SL) 0.125 MG SL tablet Place 1 tablet (0.125 mg total) under the tongue every 4 (four) hours as needed for cramping.  30 tablet  0   No facility-administered encounter medications on file as of 01/06/2013.     Orders Placed This Encounter  Procedures  . Fecal Occult Blood, Guaiac  . HM DIABETES EYE EXAM  . HM DIABETES EYE EXAM  . HM DIABETES FOOT EXAM    Return in about 3 months (around 04/08/2013).

## 2013-01-08 NOTE — Assessment & Plan Note (Signed)
Well controlled on current regimen. Renal function stable, no changes today. 

## 2013-01-08 NOTE — Assessment & Plan Note (Signed)
Secondary to obesity and diabetes/metabolic syndrome.I have addressed  BMI and recommended a low glycemic index diet utilizing smaller more frequent meals to increase metabolism.  I have also recommended that patient start exercising with a goal of 30 minutes of aerobic exercise a minimum of 5 days per week.

## 2013-01-08 NOTE — Assessment & Plan Note (Signed)
Well-controlled on current medications.  hemoglobin A1c is 6.9.  She is up-to-date on eye exams and her foot exam is normal. l we'll repeat his urine microalbumin to creatinine ratio at next visit. She is on the appropriate medications.

## 2013-02-27 ENCOUNTER — Ambulatory Visit: Payer: 59 | Admitting: Obstetrics & Gynecology

## 2013-02-28 ENCOUNTER — Ambulatory Visit (INDEPENDENT_AMBULATORY_CARE_PROVIDER_SITE_OTHER): Payer: Medicare Other | Admitting: Obstetrics & Gynecology

## 2013-02-28 ENCOUNTER — Other Ambulatory Visit (HOSPITAL_COMMUNITY)
Admission: RE | Admit: 2013-02-28 | Discharge: 2013-02-28 | Disposition: A | Discharge status: Home / Self Care | Payer: Medicare Other | Source: Ambulatory Visit | Attending: Obstetrics & Gynecology | Admitting: Obstetrics & Gynecology

## 2013-02-28 ENCOUNTER — Encounter: Payer: Self-pay | Admitting: Obstetrics & Gynecology

## 2013-02-28 VITALS — BP 133/87 | HR 71 | Ht 66.0 in | Wt 195.0 lb

## 2013-02-28 DIAGNOSIS — R87810 Cervical high risk human papillomavirus (HPV) DNA test positive: Secondary | ICD-10-CM | POA: Insufficient documentation

## 2013-02-28 DIAGNOSIS — Z1231 Encounter for screening mammogram for malignant neoplasm of breast: Secondary | ICD-10-CM

## 2013-02-28 DIAGNOSIS — Z124 Encounter for screening for malignant neoplasm of cervix: Secondary | ICD-10-CM

## 2013-02-28 DIAGNOSIS — Z01419 Encounter for gynecological examination (general) (routine) without abnormal findings: Secondary | ICD-10-CM

## 2013-02-28 DIAGNOSIS — R87619 Unspecified abnormal cytological findings in specimens from cervix uteri: Secondary | ICD-10-CM | POA: Insufficient documentation

## 2013-02-28 DIAGNOSIS — Z1239 Encounter for other screening for malignant neoplasm of breast: Secondary | ICD-10-CM

## 2013-02-28 NOTE — Progress Notes (Signed)
Here today for yearly gyn exam. No concerns.

## 2013-02-28 NOTE — Patient Instructions (Signed)
Preventive Care for Adults, Female A healthy lifestyle and preventive care can promote health and wellness. Preventive health guidelines for women include the following key practices.  A routine yearly physical is a good way to check with your caregiver about your health and preventive screening. It is a chance to share any concerns and updates on your health, and to receive a thorough exam.  Visit your dentist for a routine exam and preventive care every 6 months. Brush your teeth twice a day and floss once a day. Good oral hygiene prevents tooth decay and gum disease.  The frequency of eye exams is based on your age, health, family medical history, use of contact lenses, and other factors. Follow your caregiver's recommendations for frequency of eye exams.  Eat a healthy diet. Foods like vegetables, fruits, whole grains, low-fat dairy products, and lean protein foods contain the nutrients you need without too many calories. Decrease your intake of foods high in solid fats, added sugars, and salt. Eat the right amount of calories for you.Get information about a proper diet from your caregiver, if necessary.  Regular physical exercise is one of the most important things you can do for your health. Most adults should get at least 150 minutes of moderate-intensity exercise (any activity that increases your heart rate and causes you to sweat) each week. In addition, most adults need muscle-strengthening exercises on 2 or more days a week.  Maintain a healthy weight. The body mass index (BMI) is a screening tool to identify possible weight problems. It provides an estimate of body fat based on height and weight. Your caregiver can help determine your BMI, and can help you achieve or maintain a healthy weight.For adults 20 years and older:  A BMI below 18.5 is considered underweight.  A BMI of 18.5 to 24.9 is normal.  A BMI of 25 to 29.9 is considered overweight.  A BMI of 30 and above is  considered obese.  Maintain normal blood lipids and cholesterol levels by exercising and minimizing your intake of saturated fat. Eat a balanced diet with plenty of fruit and vegetables. Blood tests for lipids and cholesterol should begin at age 20 and be repeated every 5 years. If your lipid or cholesterol levels are high, you are over 50, or you are at high risk for heart disease, you may need your cholesterol levels checked more frequently.Ongoing high lipid and cholesterol levels should be treated with medicines if diet and exercise are not effective.  If you smoke, find out from your caregiver how to quit. If you do not use tobacco, do not start.  If you are pregnant, do not drink alcohol. If you are breastfeeding, be very cautious about drinking alcohol. If you are not pregnant and choose to drink alcohol, do not exceed 1 drink per day. One drink is considered to be 12 ounces (355 mL) of beer, 5 ounces (148 mL) of wine, or 1.5 ounces (44 mL) of liquor.  Avoid use of street drugs. Do not share needles with anyone. Ask for help if you need support or instructions about stopping the use of drugs.  High blood pressure causes heart disease and increases the risk of stroke. Your blood pressure should be checked at least every 1 to 2 years. Ongoing high blood pressure should be treated with medicines if weight loss and exercise are not effective.  If you are 55 to 66 years old, ask your caregiver if you should take aspirin to prevent strokes.  Diabetes   screening involves taking a blood sample to check your fasting blood sugar level. This should be done once every 3 years, after age 45, if you are within normal weight and without risk factors for diabetes. Testing should be considered at a younger age or be carried out more frequently if you are overweight and have at least 1 risk factor for diabetes.  Breast cancer screening is essential preventive care for women. You should practice "breast  self-awareness." This means understanding the normal appearance and feel of your breasts and may include breast self-examination. Any changes detected, no matter how small, should be reported to a caregiver. Women in their 20s and 30s should have a clinical breast exam (CBE) by a caregiver as part of a regular health exam every 1 to 3 years. After age 40, women should have a CBE every year. Starting at age 40, women should consider having a mammography (breast X-ray test) every year. Women who have a family history of breast cancer should talk to their caregiver about genetic screening. Women at a high risk of breast cancer should talk to their caregivers about having magnetic resonance imaging (MRI) and a mammography every year.  The Pap test is a screening test for cervical cancer. A Pap test can show cell changes on the cervix that might become cervical cancer if left untreated. A Pap test is a procedure in which cells are obtained and examined from the lower end of the uterus (cervix).  Women should have a Pap test starting at age 21.  Between ages 21 and 29, Pap tests should be repeated every 2 years.  Beginning at age 30, you should have a Pap test every 3 years as long as the past 3 Pap tests have been normal.  Some women have medical problems that increase the chance of getting cervical cancer. Talk to your caregiver about these problems. It is especially important to talk to your caregiver if a new problem develops soon after your last Pap test. In these cases, your caregiver may recommend more frequent screening and Pap tests.  The above recommendations are the same for women who have or have not gotten the vaccine for human papillomavirus (HPV).  If you had a hysterectomy for a problem that was not cancer or a condition that could lead to cancer, then you no longer need Pap tests. Even if you no longer need a Pap test, a regular exam is a good idea to make sure no other problems are  starting.  If you are between ages 65 and 70, and you have had normal Pap tests going back 10 years, you no longer need Pap tests. Even if you no longer need a Pap test, a regular exam is a good idea to make sure no other problems are starting.  If you have had past treatment for cervical cancer or a condition that could lead to cancer, you need Pap tests and screening for cancer for at least 20 years after your treatment.  If Pap tests have been discontinued, risk factors (such as a new sexual partner) need to be reassessed to determine if screening should be resumed.  The HPV test is an additional test that may be used for cervical cancer screening. The HPV test looks for the virus that can cause the cell changes on the cervix. The cells collected during the Pap test can be tested for HPV. The HPV test could be used to screen women aged 30 years and older, and should   be used in women of any age who have unclear Pap test results. After the age of 30, women should have HPV testing at the same frequency as a Pap test.  Colorectal cancer can be detected and often prevented. Most routine colorectal cancer screening begins at the age of 50 and continues through age 75. However, your caregiver may recommend screening at an earlier age if you have risk factors for colon cancer. On a yearly basis, your caregiver may provide home test kits to check for hidden blood in the stool. Use of a small camera at the end of a tube, to directly examine the colon (sigmoidoscopy or colonoscopy), can detect the earliest forms of colorectal cancer. Talk to your caregiver about this at age 50, when routine screening begins. Direct examination of the colon should be repeated every 5 to 10 years through age 75, unless early forms of pre-cancerous polyps or small growths are found.  Hepatitis C blood testing is recommended for all people born from 1945 through 1965 and any individual with known risks for hepatitis C.  Practice  safe sex. Use condoms and avoid high-risk sexual practices to reduce the spread of sexually transmitted infections (STIs). STIs include gonorrhea, chlamydia, syphilis, trichomonas, herpes, HPV, and human immunodeficiency virus (HIV). Herpes, HIV, and HPV are viral illnesses that have no cure. They can result in disability, cancer, and death. Sexually active women aged 25 and younger should be checked for chlamydia. Older women with new or multiple partners should also be tested for chlamydia. Testing for other STIs is recommended if you are sexually active and at increased risk.  Osteoporosis is a disease in which the bones lose minerals and strength with aging. This can result in serious bone fractures. The risk of osteoporosis can be identified using a bone density scan. Women ages 65 and over and women at risk for fractures or osteoporosis should discuss screening with their caregivers. Ask your caregiver whether you should take a calcium supplement or vitamin D to reduce the rate of osteoporosis.  Menopause can be associated with physical symptoms and risks. Hormone replacement therapy is available to decrease symptoms and risks. You should talk to your caregiver about whether hormone replacement therapy is right for you.  Use sunscreen with sun protection factor (SPF) of 30 or more. Apply sunscreen liberally and repeatedly throughout the day. You should seek shade when your shadow is shorter than you. Protect yourself by wearing long sleeves, pants, a wide-brimmed hat, and sunglasses year round, whenever you are outdoors.  Once a month, do a whole body skin exam, using a mirror to look at the skin on your back. Notify your caregiver of new moles, moles that have irregular borders, moles that are larger than a pencil eraser, or moles that have changed in shape or color.  Stay current with required immunizations.  Influenza. You need a dose every fall (or winter). The composition of the flu vaccine  changes each year, so being vaccinated once is not enough.  Pneumococcal polysaccharide. You need 1 to 2 doses if you smoke cigarettes or if you have certain chronic medical conditions. You need 1 dose at age 65 (or older) if you have never been vaccinated.  Tetanus, diphtheria, pertussis (Tdap, Td). Get 1 dose of Tdap vaccine if you are younger than age 65, are over 65 and have contact with an infant, are a healthcare worker, are pregnant, or simply want to be protected from whooping cough. After that, you need a Td   booster dose every 10 years. Consult your caregiver if you have not had at least 3 tetanus and diphtheria-containing shots sometime in your life or have a deep or dirty wound.  HPV. You need this vaccine if you are a woman age 26 or younger. The vaccine is given in 3 doses over 6 months.  Measles, mumps, rubella (MMR). You need at least 1 dose of MMR if you were born in 1957 or later. You may also need a second dose.  Meningococcal. If you are age 19 to 21 and a first-year college student living in a residence hall, or have one of several medical conditions, you need to get vaccinated against meningococcal disease. You may also need additional booster doses.  Zoster (shingles). If you are age 60 or older, you should get this vaccine.  Varicella (chickenpox). If you have never had chickenpox or you were vaccinated but received only 1 dose, talk to your caregiver to find out if you need this vaccine.  Hepatitis A. You need this vaccine if you have a specific risk factor for hepatitis A virus infection or you simply wish to be protected from this disease. The vaccine is usually given as 2 doses, 6 to 18 months apart.  Hepatitis B. You need this vaccine if you have a specific risk factor for hepatitis B virus infection or you simply wish to be protected from this disease. The vaccine is given in 3 doses, usually over 6 months. Preventive Services / Frequency Ages 19 to 39  Blood  pressure check.** / Every 1 to 2 years.  Lipid and cholesterol check.** / Every 5 years beginning at age 20.  Clinical breast exam.** / Every 3 years for women in their 20s and 30s.  Pap test.** / Every 2 years from ages 21 through 29. Every 3 years starting at age 30 through age 65 or 70 with a history of 3 consecutive normal Pap tests.  HPV screening.** / Every 3 years from ages 30 through ages 65 to 70 with a history of 3 consecutive normal Pap tests.  Hepatitis C blood test.** / For any individual with known risks for hepatitis C.  Skin self-exam. / Monthly.  Influenza immunization.** / Every year.  Pneumococcal polysaccharide immunization.** / 1 to 2 doses if you smoke cigarettes or if you have certain chronic medical conditions.  Tetanus, diphtheria, pertussis (Tdap, Td) immunization. / A one-time dose of Tdap vaccine. After that, you need a Td booster dose every 10 years.  HPV immunization. / 3 doses over 6 months, if you are 26 and younger.  Measles, mumps, rubella (MMR) immunization. / You need at least 1 dose of MMR if you were born in 1957 or later. You may also need a second dose.  Meningococcal immunization. / 1 dose if you are age 19 to 21 and a first-year college student living in a residence hall, or have one of several medical conditions, you need to get vaccinated against meningococcal disease. You may also need additional booster doses.  Varicella immunization.** / Consult your caregiver.  Hepatitis A immunization.** / Consult your caregiver. 2 doses, 6 to 18 months apart.  Hepatitis B immunization.** / Consult your caregiver. 3 doses usually over 6 months. Ages 40 to 64  Blood pressure check.** / Every 1 to 2 years.  Lipid and cholesterol check.** / Every 5 years beginning at age 20.  Clinical breast exam.** / Every year after age 40.  Mammogram.** / Every year beginning at age 40   and continuing for as long as you are in good health. Consult with your  caregiver.  Pap test.** / Every 3 years starting at age 30 through age 65 or 70 with a history of 3 consecutive normal Pap tests.  HPV screening.** / Every 3 years from ages 30 through ages 65 to 70 with a history of 3 consecutive normal Pap tests.  Fecal occult blood test (FOBT) of stool. / Every year beginning at age 50 and continuing until age 75. You may not need to do this test if you get a colonoscopy every 10 years.  Flexible sigmoidoscopy or colonoscopy.** / Every 5 years for a flexible sigmoidoscopy or every 10 years for a colonoscopy beginning at age 50 and continuing until age 75.  Hepatitis C blood test.** / For all people born from 1945 through 1965 and any individual with known risks for hepatitis C.  Skin self-exam. / Monthly.  Influenza immunization.** / Every year.  Pneumococcal polysaccharide immunization.** / 1 to 2 doses if you smoke cigarettes or if you have certain chronic medical conditions.  Tetanus, diphtheria, pertussis (Tdap, Td) immunization.** / A one-time dose of Tdap vaccine. After that, you need a Td booster dose every 10 years.  Measles, mumps, rubella (MMR) immunization. / You need at least 1 dose of MMR if you were born in 1957 or later. You may also need a second dose.  Varicella immunization.** / Consult your caregiver.  Meningococcal immunization.** / Consult your caregiver.  Hepatitis A immunization.** / Consult your caregiver. 2 doses, 6 to 18 months apart.  Hepatitis B immunization.** / Consult your caregiver. 3 doses, usually over 6 months. Ages 65 and over  Blood pressure check.** / Every 1 to 2 years.  Lipid and cholesterol check.** / Every 5 years beginning at age 20.  Clinical breast exam.** / Every year after age 40.  Mammogram.** / Every year beginning at age 40 and continuing for as long as you are in good health. Consult with your caregiver.  Pap test.** / Every 3 years starting at age 30 through age 65 or 70 with a 3  consecutive normal Pap tests. Testing can be stopped between 65 and 70 with 3 consecutive normal Pap tests and no abnormal Pap or HPV tests in the past 10 years.  HPV screening.** / Every 3 years from ages 30 through ages 65 or 70 with a history of 3 consecutive normal Pap tests. Testing can be stopped between 65 and 70 with 3 consecutive normal Pap tests and no abnormal Pap or HPV tests in the past 10 years.  Fecal occult blood test (FOBT) of stool. / Every year beginning at age 50 and continuing until age 75. You may not need to do this test if you get a colonoscopy every 10 years.  Flexible sigmoidoscopy or colonoscopy.** / Every 5 years for a flexible sigmoidoscopy or every 10 years for a colonoscopy beginning at age 50 and continuing until age 75.  Hepatitis C blood test.** / For all people born from 1945 through 1965 and any individual with known risks for hepatitis C.  Osteoporosis screening.** / A one-time screening for women ages 65 and over and women at risk for fractures or osteoporosis.  Skin self-exam. / Monthly.  Influenza immunization.** / Every year.  Pneumococcal polysaccharide immunization.** / 1 dose at age 65 (or older) if you have never been vaccinated.  Tetanus, diphtheria, pertussis (Tdap, Td) immunization. / A one-time dose of Tdap vaccine if you are over   65 and have contact with an infant, are a healthcare worker, or simply want to be protected from whooping cough. After that, you need a Td booster dose every 10 years.  Varicella immunization.** / Consult your caregiver.  Meningococcal immunization.** / Consult your caregiver.  Hepatitis A immunization.** / Consult your caregiver. 2 doses, 6 to 18 months apart.  Hepatitis B immunization.** / Check with your caregiver. 3 doses, usually over 6 months. ** Family history and personal history of risk and conditions may change your caregiver's recommendations. Document Released: 06/30/2001 Document Revised: 07/27/2011  Document Reviewed: 09/29/2010 [employer] Patient Information 2014 [employer]  

## 2013-02-28 NOTE — Progress Notes (Signed)
  Subjective:     [name] is a 67 y.o. [phone] PMP female and is here for a comprehensive physical exam. The patient reports no problems. Sexually active.  No bleeding, abnormal vaginal discharge or other GYN issues.  History   Social History  . Marital Status: Married    Spouse Name: N/A    Number of Children: N/A  . 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 Smoking status: Never Smoker   . Smokeless tobacco: Never Used  . Alcohol Use: No  . Drug Use: No  . Sexual Activity: Yes    Partners: Male   Other Topics Concern  . Not on file   Social History Narrative  . No narrative on file   Health Maintenance  Topic Date Due  . Tetanus/tdap  03/01/1966  . Zostavax  03/02/2007  . Influenza Vaccine  12/16/2012  . Mammogram  03/14/2014  . Colonoscopy  07/06/2014  . Pneumococcal Polysaccharide Vaccine Age 28 And Over  Completed    The following portions of the patient's history were reviewed and updated as appropriate: allergies, current medications, past family history, past medical history, past social history, past surgical history and problem list.  Review of Systems A comprehensive review of systems was negative.   Objective:     BP 133/87  Pulse 71  Ht 5\' 6"  (1.676 m)  Wt 195 lb (88.451 kg)  BMI 31.49 kg/m2 GENERAL: Well-developed, obese female in no acute distress.  HEENT: Normocephalic, atraumatic. Sclerae anicteric.  NECK: Supple. Normal thyroid.  LUNGS: Clear to auscultation bilaterally.  HEART: Regular rate and rhythm.  BREASTS: Large, symmetric in size. No masses, skin changes, nipple drainage, or lymphadenopathy.  ABDOMEN: Soft, nontender, nondistended. No organomegaly palpated. PELVIC: Normal external female genitalia. Vagina is pink and rugated. Normal discharge. Normal cervix contour. Pap smear obtained. Unable to palpate uterus or adnexa secondary to habitus  EXTREMITIES: No cyanosis,  clubbing, or edema, 2+ distal pulses.   Assessment:    Healthy female exam.    Plan:   Pap done, will follow up results and manage accordingly. Mammogram scheduled Routine preventative health maintenance measures emphasized

## 2013-03-02 ENCOUNTER — Encounter: Payer: Self-pay | Admitting: Obstetrics & Gynecology

## 2013-03-10 LAB — HM PAP SMEAR: HM Pap smear: POSITIVE

## 2013-03-23 ENCOUNTER — Other Ambulatory Visit: Payer: Self-pay

## 2013-04-11 ENCOUNTER — Other Ambulatory Visit (INDEPENDENT_AMBULATORY_CARE_PROVIDER_SITE_OTHER): Payer: 59

## 2013-04-11 ENCOUNTER — Telehealth: Payer: Self-pay | Admitting: *Deleted

## 2013-04-11 DIAGNOSIS — E785 Hyperlipidemia, unspecified: Secondary | ICD-10-CM

## 2013-04-11 DIAGNOSIS — E119 Type 2 diabetes mellitus without complications: Secondary | ICD-10-CM

## 2013-04-11 DIAGNOSIS — E1059 Type 1 diabetes mellitus with other circulatory complications: Secondary | ICD-10-CM

## 2013-04-11 LAB — LIPID PANEL
Cholesterol: 139 mg/dL (ref 0–200)
HDL: 42.1 mg/dL (ref >39.00)
LDL Cholesterol: 72 mg/dL (ref 0–99)
Total CHOL/HDL Ratio: 3
Triglycerides: 124 mg/dL (ref 0.0–149.0)
VLDL: 24.8 mg/dL (ref 0.0–40.0)

## 2013-04-11 LAB — COMPREHENSIVE METABOLIC PANEL
ALT: 19 U/L (ref 0–35)
AST: 23 U/L (ref 0–37)
Albumin: 3.7 g/dL (ref 3.5–5.2)
Alkaline Phosphatase: 78 U/L (ref 39–117)
BUN: 13 mg/dL (ref 6–23)
CO2: 28 mEq/L (ref 19–32)
Calcium: 9.1 mg/dL (ref 8.4–10.5)
Chloride: 103 mEq/L (ref 96–112)
Creatinine, Ser: 0.8 mg/dL (ref 0.4–1.2)
GFR: 77.36 mL/min (ref >60.00)
Glucose, Bld: 141 mg/dL — ABNORMAL HIGH (ref 70–99)
Potassium: 3.9 mEq/L (ref 3.5–5.1)
Sodium: 138 mEq/L (ref 135–145)
Total Bilirubin: 0.8 mg/dL (ref 0.3–1.2)
Total Protein: 6.5 g/dL (ref 6.0–8.3)

## 2013-04-11 LAB — HEMOGLOBIN A1C: Hgb A1c MFr Bld: 7.2 % — ABNORMAL HIGH (ref 4.6–6.5)

## 2013-04-11 NOTE — Telephone Encounter (Signed)
What labs and dx?  

## 2013-04-12 ENCOUNTER — Encounter: Payer: Self-pay | Admitting: Internal Medicine

## 2013-04-20 ENCOUNTER — Ambulatory Visit (INDEPENDENT_AMBULATORY_CARE_PROVIDER_SITE_OTHER): Payer: 59 | Admitting: Internal Medicine

## 2013-04-20 ENCOUNTER — Encounter: Payer: Self-pay | Admitting: Internal Medicine

## 2013-04-20 VITALS — BP 128/76 | HR 74 | Temp 97.6°F | Resp 12 | Ht 66.0 in | Wt 199.5 lb

## 2013-04-20 DIAGNOSIS — I10 Essential (primary) hypertension: Secondary | ICD-10-CM

## 2013-04-20 DIAGNOSIS — E669 Obesity, unspecified: Secondary | ICD-10-CM

## 2013-04-20 DIAGNOSIS — R079 Chest pain, unspecified: Secondary | ICD-10-CM

## 2013-04-20 DIAGNOSIS — E1169 Type 2 diabetes mellitus with other specified complication: Secondary | ICD-10-CM

## 2013-04-20 DIAGNOSIS — E119 Type 2 diabetes mellitus without complications: Secondary | ICD-10-CM

## 2013-04-20 DIAGNOSIS — E785 Hyperlipidemia, unspecified: Secondary | ICD-10-CM

## 2013-04-20 LAB — HM DIABETES FOOT EXAM: HM Diabetic Foot Exam: NORMAL

## 2013-04-20 MED ORDER — TETANUS-DIPHTH-ACELL PERTUSSIS 5-2.5-18.5 LF-MCG/0.5 IM SUSP: 0.5000 mL | Freq: Once | INTRAMUSCULAR | Status: DC

## 2013-04-20 MED ORDER — ATORVASTATIN CALCIUM 80 MG PO TABS: 80.0000 mg | ORAL_TABLET | Freq: Every day | ORAL | Status: DC

## 2013-04-20 NOTE — Progress Notes (Signed)
Patient ID: [name], female   DOB: 07-07-1945, 67 y.o.   MRN: [id number]   Patient Active Problem List   Diagnosis Date Noted  . Normal pap smear, positive high risk HPV 02/28/2013  . Gastritis   . Fatty liver disease, nonalcoholic 07/07/2011  . Chest pain at rest 07/07/2011  . Iron deficiency anemia 02/11/2011  . Diabetes mellitus type 2 in obese 02/11/2011  . Hypertension 02/11/2011  . Hyperlipidemia     Subjective:  CC:   Chief Complaint  Patient presents with  . Follow-up  . Diabetes    HPI:   [name] a 67 y.o. female who presents 3 month DM follow up.  She has gained 4 lbs despite exercisign regularly.  Has had some loss of control,  Based on increased in A1c from 6.9 now 7.2   She has been having esophageal spasms occurring 2/month  Relieved with hyoscyamine . Taking protonix appropriately  Tolerating crestor but the monthly cost  too $$$$   Three-month followup on diabetes hypertension hyperlipidemia and obesity. She has been following a low carbohydrate diet but not exercising regularly.   Past Medical History  Diagnosis Date  . Hyperlipidemia     Lipitor trial was a failure  . Hypertension   . Diabetes mellitus   . High cholesterol   . Gastritis     Past Surgical History  Procedure Laterality Date  . Spine surgery  1995  . Tubal ligation    . Spine surgery      bone graft   . Cardiac catheterization  Augus 2007    normal coronaries,  EF 60%       The following portions of the patient's history were reviewed and updated as appropriate: Allergies, current medications, and problem list.    Review of Systems:   Patient denies headache, fevers, malaise, unintentional weight loss, skin rash, eye pain, sinus congestion and sinus pain, sore throat, dysphagia,  hemoptysis , cough, dyspnea, wheezing, chest pain, palpitations, orthopnea, edema, abdominal pain, nausea, melena, diarrhea, constipation, flank pain, dysuria, hematuria, urinary   Frequency, nocturia, numbness, tingling, seizures,  Focal weakness, Loss of consciousness,  Tremor, insomnia, depression, anxiety, and suicidal ideation.         History   Social History  . Marital Status: Married    Spouse Name: N/A    Number of Children: N/A  . 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 Smoking status: Never Smoker   . Smokeless tobacco: Never Used  . Alcohol Use: No  . Drug Use: No  . Sexual Activity: Yes    Partners: Male   Other Topics Concern  . Not on file   Social History Narrative  . No narrative on file    Objective:  Filed Vitals:   04/20/13 0852  BP: 128/76  Pulse: 74  Temp: 97.6 F (36.4 C)  Resp: 12     General appearance: alert, cooperative and appears stated age Ears: normal TM's and external ear canals both ears Throat: lips, mucosa, and tongue normal; teeth and gums normal Neck: no adenopathy, no carotid bruit, supple, symmetrical, trachea midline and thyroid not enlarged, symmetric, no tenderness/mass/nodules Back: symmetric, no curvature. ROM normal. No CVA tenderness. Lungs: clear to auscultation bilaterally Heart: regular rate and rhythm, S1, S2 normal, no murmur, click, rub or gallop Abdomen: soft, non-tender; bowel sounds normal; no masses,  no organomegaly Pulses: 2+  and symmetric Skin: Skin color, texture, turgor normal. No rashes or lesions Lymph nodes: Cervical, supraclavicular, and axillary nodes normal.  Assessment and Plan:  Diabetes mellitus type 2 in obese Well-controlled on current medications.  hemoglobin A1c is 7.2.  She is up-to-date on eye exams and her foot exam is normal. l we'll repeat his urine microalbumin to creatinine ratio at next visit. She is on the appropriate medications.    Chest pain at rest Negative cardiac workup.  treating for esophageal spasm.   Hyperlipidemia Resume lipitor given cost of crestor.    Hypertension Well controlled on current regimen. Renal function stable, no changes today. Lab Results  Component Value Date   CREATININE 0.8 04/11/2013      Updated Medication List Outpatient Encounter Prescriptions as of 04/20/2013  Medication Sig  . aspirin 81 MG tablet Take 81 mg by mouth daily.    . Calcium Carbonate-Vitamin D (CALCIUM + D) 600-200 MG-UNIT TABS Take by mouth 3 (three) times daily.    . fish oil-omega-3 fatty acids 1000 MG capsule Take 2 g by mouth 2 (two) times daily.    Marland Kitchen glipiZIDE (GLUCOTROL) 5 MG tablet TAKE ONE-HALF (1/2) TABLET TWICE DAILY BEFORE MEALS  . Glucose Blood (FREESTYLE LITE TEST VI) 3 strips by In Vitro route 3 (three) times daily.  . hyoscyamine (LEVSIN, ANASPAZ) 0.125 MG tablet Take 1 tablet (0.125 mg total) by mouth every 4 (four) hours as needed.  Marland Kitchen losartan (COZAAR) 50 MG tablet Take 1 tablet (50 mg total) by mouth daily.  . Multiple Vitamin (MULTIVITAMIN) tablet Take 1 tablet by mouth daily.  . pantoprazole (PROTONIX) 40 MG tablet Take 1 tablet (40 mg total) by mouth daily.  . vitamin C (ASCORBIC ACID) 500 MG tablet Take 500 mg by mouth daily.  . vitamin E 100 UNIT capsule Take 100 Units by mouth daily.  . [DISCONTINUED] rosuvastatin (CRESTOR) 20 MG tablet Take 1 tablet (20 mg total) by mouth daily.  Marland Kitchen atorvastatin (LIPITOR) 80 MG tablet Take 1 tablet (80 mg total) by mouth daily.  . Famotidine-Ca Carb-Mag Hydrox (PEPCID COMPLETE PO) Take by mouth as needed  . Tdap (BOOSTRIX) 5-2.5-18.5 LF-MCG/0.5 injection Inject 0.5 mLs into the muscle once.  . [DISCONTINUED] glucose blood (FREESTYLE LITE) test strip Use to check blood sugars three times daily     Orders Placed This Encounter  Procedures  . HM DIABETES FOOT EXAM    No Follow-up on file.

## 2013-04-20 NOTE — Patient Instructions (Signed)
We are changing your cholesterol medication from Crestor to generic Lipitor for cost savings in the future  Please return in 3 months ,  Fasting lipids prior   Your  Need to have your tetanus-diptheria-pertussis vaccine (TDaP) but you can get it for less $$$ at a local pharmacy or the health Dept with the script I have provided you.

## 2013-04-20 NOTE — Assessment & Plan Note (Signed)
Well-controlled on current medications.  hemoglobin A1c is 7.2.  She is up-to-date on eye exams and her foot exam is normal. l we'll repeat his urine microalbumin to creatinine ratio at next visit. She is on the appropriate medications.

## 2013-04-20 NOTE — Progress Notes (Signed)
Pre-visit discussion using our clinic review tool. No additional management support is needed unless otherwise documented below in the visit note.  

## 2013-04-22 NOTE — Assessment & Plan Note (Signed)
Well controlled on current regimen. Renal function stable, no changes today. Lab Results  Component Value Date   CREATININE 0.8 04/11/2013

## 2013-04-22 NOTE — Assessment & Plan Note (Signed)
Resume lipitor given cost of crestor.

## 2013-04-22 NOTE — Assessment & Plan Note (Signed)
Negative cardiac workup.  treating for esophageal spasm.

## 2013-06-13 ENCOUNTER — Other Ambulatory Visit: Payer: Self-pay | Admitting: *Deleted

## 2013-06-13 MED ORDER — LOSARTAN POTASSIUM 50 MG PO TABS: 50.0000 mg | ORAL_TABLET | Freq: Every day | ORAL | Status: DC

## 2013-06-21 ENCOUNTER — Other Ambulatory Visit: Payer: Self-pay | Admitting: *Deleted

## 2013-06-21 MED ORDER — GLIPIZIDE 5 MG PO TABS: ORAL_TABLET | ORAL | Status: DC

## 2013-06-27 ENCOUNTER — Other Ambulatory Visit: Payer: Self-pay | Admitting: *Deleted

## 2013-06-27 MED ORDER — PANTOPRAZOLE SODIUM 40 MG PO TBEC: 40.0000 mg | DELAYED_RELEASE_TABLET | Freq: Every day | ORAL | Status: DC

## 2013-07-19 ENCOUNTER — Telehealth: Payer: Self-pay | Admitting: *Deleted

## 2013-07-19 DIAGNOSIS — E1169 Type 2 diabetes mellitus with other specified complication: Secondary | ICD-10-CM

## 2013-07-19 DIAGNOSIS — D509 Iron deficiency anemia, unspecified: Secondary | ICD-10-CM

## 2013-07-19 DIAGNOSIS — E785 Hyperlipidemia, unspecified: Secondary | ICD-10-CM

## 2013-07-19 DIAGNOSIS — K760 Fatty (change of) liver, not elsewhere classified: Secondary | ICD-10-CM

## 2013-07-19 DIAGNOSIS — E669 Obesity, unspecified: Secondary | ICD-10-CM

## 2013-07-19 NOTE — Telephone Encounter (Signed)
Pt is coming in tomorrow what labs and dx?  

## 2013-07-20 ENCOUNTER — Other Ambulatory Visit (INDEPENDENT_AMBULATORY_CARE_PROVIDER_SITE_OTHER): Payer: Medicare Other

## 2013-07-20 DIAGNOSIS — E669 Obesity, unspecified: Secondary | ICD-10-CM

## 2013-07-20 DIAGNOSIS — K7689 Other specified diseases of liver: Secondary | ICD-10-CM

## 2013-07-20 DIAGNOSIS — E119 Type 2 diabetes mellitus without complications: Secondary | ICD-10-CM

## 2013-07-20 DIAGNOSIS — K760 Fatty (change of) liver, not elsewhere classified: Secondary | ICD-10-CM

## 2013-07-20 DIAGNOSIS — E1169 Type 2 diabetes mellitus with other specified complication: Secondary | ICD-10-CM

## 2013-07-20 DIAGNOSIS — E785 Hyperlipidemia, unspecified: Secondary | ICD-10-CM

## 2013-07-20 LAB — COMPREHENSIVE METABOLIC PANEL
ALT: 20 U/L (ref 0–35)
AST: 26 U/L (ref 0–37)
Albumin: 3.7 g/dL (ref 3.5–5.2)
Alkaline Phosphatase: 82 U/L (ref 39–117)
BUN: 13 mg/dL (ref 6–23)
CO2: 29 mEq/L (ref 19–32)
Calcium: 9.4 mg/dL (ref 8.4–10.5)
Chloride: 103 mEq/L (ref 96–112)
Creatinine, Ser: 0.8 mg/dL (ref 0.4–1.2)
GFR: 80.83 mL/min (ref >60.00)
Glucose, Bld: 149 mg/dL — ABNORMAL HIGH (ref 70–99)
Potassium: 4.2 mEq/L (ref 3.5–5.1)
Sodium: 137 mEq/L (ref 135–145)
Total Bilirubin: 0.9 mg/dL (ref 0.3–1.2)
Total Protein: 6.9 g/dL (ref 6.0–8.3)

## 2013-07-20 LAB — LIPID PANEL
Cholesterol: 128 mg/dL (ref 0–200)
HDL: 42.3 mg/dL (ref >39.00)
LDL Cholesterol: 65 mg/dL (ref 0–99)
Total CHOL/HDL Ratio: 3
Triglycerides: 105 mg/dL (ref 0.0–149.0)
VLDL: 21 mg/dL (ref 0.0–40.0)

## 2013-07-20 LAB — HEMOGLOBIN A1C: Hgb A1c MFr Bld: 7.7 % — ABNORMAL HIGH (ref 4.6–6.5)

## 2013-07-20 LAB — MICROALBUMIN / CREATININE URINE RATIO
Creatinine,U: 21.3 mg/dL
Microalb Creat Ratio: 0.9 mg/g (ref 0.0–30.0)
Microalb, Ur: 0.2 mg/dL (ref 0.0–1.9)

## 2013-07-21 ENCOUNTER — Encounter: Payer: Self-pay | Admitting: Internal Medicine

## 2013-07-24 NOTE — Telephone Encounter (Signed)
Unread mychart message mailed  

## 2013-07-27 ENCOUNTER — Ambulatory Visit: Payer: Medicare Other | Admitting: Internal Medicine

## 2013-07-30 LAB — HM DEXA SCAN: HM Dexa Scan: NORMAL

## 2013-09-14 ENCOUNTER — Telehealth: Payer: Self-pay | Admitting: Internal Medicine

## 2013-09-14 NOTE — Telephone Encounter (Signed)
Blood sugars reviewed  sheneeds to check more post prandials and less fastings  To find the reason for elevated A1c

## 2013-09-14 NOTE — Telephone Encounter (Signed)
Left message for patient to return call.

## 2013-09-14 NOTE — Telephone Encounter (Signed)
Patient notified

## 2013-09-27 LAB — HM DIABETES EYE EXAM

## 2013-10-04 ENCOUNTER — Other Ambulatory Visit: Payer: Self-pay | Admitting: Internal Medicine

## 2013-10-04 MED ORDER — GLIPIZIDE 5 MG PO TABS: ORAL_TABLET | ORAL | Status: DC

## 2013-10-05 ENCOUNTER — Telehealth: Payer: Self-pay | Admitting: Internal Medicine

## 2013-10-05 NOTE — Telephone Encounter (Signed)
Patient stopped by to drop off blood sugar readings. Note in box/msn °

## 2013-10-09 ENCOUNTER — Telehealth: Payer: Self-pay | Admitting: Internal Medicine

## 2013-10-09 DIAGNOSIS — E119 Type 2 diabetes mellitus without complications: Secondary | ICD-10-CM

## 2013-10-09 NOTE — Telephone Encounter (Signed)
Received and reviewed blood sugar log.  No changes necessary.  She is due for A1c and other nonfasting labs after June 5

## 2013-10-10 NOTE — Telephone Encounter (Signed)
Left message, notifying patient. 

## 2013-10-18 ENCOUNTER — Encounter: Payer: Self-pay | Admitting: Podiatry

## 2013-10-23 ENCOUNTER — Ambulatory Visit (INDEPENDENT_AMBULATORY_CARE_PROVIDER_SITE_OTHER): Payer: 59

## 2013-10-23 ENCOUNTER — Ambulatory Visit (INDEPENDENT_AMBULATORY_CARE_PROVIDER_SITE_OTHER): Payer: 59 | Admitting: Podiatry

## 2013-10-23 ENCOUNTER — Encounter: Payer: Self-pay | Admitting: Podiatry

## 2013-10-23 VITALS — BP 148/84 | HR 74 | Resp 16

## 2013-10-23 DIAGNOSIS — M779 Enthesopathy, unspecified: Secondary | ICD-10-CM

## 2013-10-23 DIAGNOSIS — Q828 Other specified congenital malformations of skin: Secondary | ICD-10-CM

## 2013-10-23 DIAGNOSIS — M7750 Other enthesopathy of unspecified foot: Secondary | ICD-10-CM

## 2013-10-23 NOTE — Progress Notes (Signed)
Subjective:    Patient ID: [name], female    DOB: 1946/03/27, 68 y.o.   MRN: [id number]  HPI Comments: i have a place on the side of my right foot that hurts, i may need new orthotics, and my great toenail on my rt foot is thick. Donnald Garre been having foot problems for 1 month. Its gotten worse. i dont do anything for my feet. Exercising hurts.  Foot Pain      Review of Systems  Gastrointestinal:       Bloating   Endocrine: Positive for heat intolerance.  Musculoskeletal: Positive for back pain.  All other systems reviewed and are negative.      Objective:   Physical Exam: I have reviewed her past medical history medications allergies surgeries social history and review of systems. Pulses are strongly palpable bilateral. Neurologic sensorium is intact per since once the monofilament. Deep tendon reflexes are intact bilateral. Muscle strength is 5 over 5 dorsiflexors plantar flexors inverters everters all intrinsic musculature is intact. Orthopedic evaluation demonstrates all joints distal to the ankle a full range of motion without crepitation. She does have mild flexible hammertoe deformities. She has pain on palpation of the fifth metatarsal base of the right foot but that is associated with soft tissue lesions. Dermatologic evaluation demonstrates nail dystrophy hallux right with porokeratosis and diffuse callus right foot. Most painful porokeratotic lesion is centered beneath the fifth metatarsal base of the right foot. I see no signs of infection. Radiographic evaluation does not demonstrate any type of osseous abnormalities in this area.        Assessment & Plan:  Assessment: Porokeratosis right sub-fifth met base. Capsulitis forefoot bilateral. Metatarsalgia bilateral. Nail dystrophy hallux right.  Plan: Discussed etiology pathology conservative versus surgical therapies. Manual debridement of porokeratotic lesion was performed today with chemical destruction to follow.  Padding was placed with salicylic acid to the lateral 3 days prior to getting this wet. She's also scanned for a pair orthotics today. I will followup with her once her orthotics come in

## 2013-10-30 ENCOUNTER — Other Ambulatory Visit: Payer: Medicare Other

## 2013-11-01 ENCOUNTER — Encounter: Payer: Self-pay | Admitting: *Deleted

## 2013-11-01 ENCOUNTER — Ambulatory Visit: Payer: Medicare Other | Admitting: Internal Medicine

## 2013-11-01 NOTE — Progress Notes (Signed)
SENT PT POST CARD LETTING HER KNOW ORTHOTICS ARE HERE.

## 2013-11-06 ENCOUNTER — Encounter: Payer: Self-pay | Admitting: Podiatry

## 2013-11-06 ENCOUNTER — Other Ambulatory Visit (INDEPENDENT_AMBULATORY_CARE_PROVIDER_SITE_OTHER): Payer: Medicare Other

## 2013-11-06 ENCOUNTER — Ambulatory Visit (INDEPENDENT_AMBULATORY_CARE_PROVIDER_SITE_OTHER): Payer: 59 | Admitting: Podiatry

## 2013-11-06 DIAGNOSIS — E119 Type 2 diabetes mellitus without complications: Secondary | ICD-10-CM

## 2013-11-06 DIAGNOSIS — M779 Enthesopathy, unspecified: Secondary | ICD-10-CM

## 2013-11-06 DIAGNOSIS — M7750 Other enthesopathy of unspecified foot: Secondary | ICD-10-CM

## 2013-11-06 LAB — COMPREHENSIVE METABOLIC PANEL
ALT: 17 U/L (ref 0–35)
AST: 28 U/L (ref 0–37)
Albumin: 3.9 g/dL (ref 3.5–5.2)
Alkaline Phosphatase: 80 U/L (ref 39–117)
BUN: 16 mg/dL (ref 6–23)
CO2: 29 mEq/L (ref 19–32)
Calcium: 9.2 mg/dL (ref 8.4–10.5)
Chloride: 103 mEq/L (ref 96–112)
Creatinine, Ser: 0.7 mg/dL (ref 0.4–1.2)
GFR: 83.28 mL/min (ref >60.00)
Glucose, Bld: 155 mg/dL — ABNORMAL HIGH (ref 70–99)
Potassium: 4.5 mEq/L (ref 3.5–5.1)
Sodium: 138 mEq/L (ref 135–145)
Total Bilirubin: 0.9 mg/dL (ref 0.2–1.2)
Total Protein: 7 g/dL (ref 6.0–8.3)

## 2013-11-06 LAB — HEMOGLOBIN A1C: Hgb A1c MFr Bld: 7.5 % — ABNORMAL HIGH (ref 4.6–6.5)

## 2013-11-06 NOTE — Patient Instructions (Signed)

## 2013-11-06 NOTE — Progress Notes (Signed)
Pt presents for orthotic pick up , written and verbal instructions given

## 2013-11-08 ENCOUNTER — Encounter: Payer: Self-pay | Admitting: Internal Medicine

## 2013-11-08 ENCOUNTER — Ambulatory Visit (INDEPENDENT_AMBULATORY_CARE_PROVIDER_SITE_OTHER): Payer: Medicare Other | Admitting: Internal Medicine

## 2013-11-08 VITALS — BP 134/72 | HR 85 | Temp 97.4°F | Resp 16 | Ht 66.0 in | Wt 193.5 lb

## 2013-11-08 DIAGNOSIS — E785 Hyperlipidemia, unspecified: Secondary | ICD-10-CM

## 2013-11-08 DIAGNOSIS — E1169 Type 2 diabetes mellitus with other specified complication: Secondary | ICD-10-CM

## 2013-11-08 DIAGNOSIS — K760 Fatty (change of) liver, not elsewhere classified: Secondary | ICD-10-CM

## 2013-11-08 DIAGNOSIS — Z23 Encounter for immunization: Secondary | ICD-10-CM

## 2013-11-08 DIAGNOSIS — E119 Type 2 diabetes mellitus without complications: Secondary | ICD-10-CM

## 2013-11-08 DIAGNOSIS — I10 Essential (primary) hypertension: Secondary | ICD-10-CM

## 2013-11-08 DIAGNOSIS — E669 Obesity, unspecified: Secondary | ICD-10-CM

## 2013-11-08 DIAGNOSIS — K7689 Other specified diseases of liver: Secondary | ICD-10-CM

## 2013-11-08 LAB — HM DIABETES FOOT EXAM: HM Diabetic Foot Exam: NORMAL

## 2013-11-08 MED ORDER — GLIPIZIDE 5 MG PO TABS: ORAL_TABLET | ORAL | Status: DC

## 2013-11-08 NOTE — Patient Instructions (Signed)
You diabetes is improving,  But not yet at goal of 7.0  (Your a1c was 7.5)   Increase your glipizide to 5 mg before dinner,  Continue 2.5 mg before breakfast  Start checking post prandial dinner sugars    I recommend getting the majority of your calcium and Vitamin D  through diet rather than supplements given the recent association of calcium supplements with increased coronary artery calcium scores  Unsweetened almond/coconut milk is a great low calorie low carb way to increase your dietary calcium and vitamin D.  Try the blue Diamond  brand

## 2013-11-08 NOTE — Assessment & Plan Note (Addendum)
Improving control with low GI diet, glipizide  and exercise .  hemoglobin A1c is now 7. 5.  Dinner Post prandials are still elevated and she was advised to increase glipizide to 5 mg before dinner and contineu 2.5 mg before breakfast.  Patient is up to date on eye exam and foot exam was normal today.  There is  no proteinuria on current  micro urinalysis .  Fasting lipids have been evaluated  and statin therapy advised as indicated by application of new ACC guidelines based on patient's 10 year risk of CAD, but she is statin intolerant .  Lab Results  Component Value Date   MICROALBUR 0.2 07/20/2013   Lab Results  Component Value Date   HGBA1C 7.5* 11/06/2013

## 2013-11-08 NOTE — Progress Notes (Signed)
Patient ID: [name], female   DOB: March 08, 1946, 68 y.o.   MRN: [id number]     Patient Active Problem List   Diagnosis Date Noted  . Normal pap smear, positive high risk HPV 02/28/2013  . Gastritis   . Fatty liver disease, nonalcoholic 26/94/8546  . Chest pain at rest 07/07/2011  . Iron deficiency anemia 02/11/2011  . Diabetes mellitus type 2 in obese 02/11/2011  . Hypertension 02/11/2011  . Hyperlipidemia     Subjective:  CC:   Chief Complaint  Patient presents with  . Follow-up  . Diabetes    HPI:   [name] is a 68 y.o. female who presents for 3 month diabetes follow up.  She has been  modifying her diet and exercising regularly and has lost 6 lbs since her December visit.  Has not tolerated lipitor due to myalgias and discontinued it.  No lows.  Post prandials are still > 150 frequently. Denies foot numbness,  Burning and ulcerations.  Had her eye exam last month.   Past Medical History  Diagnosis Date  . Hyperlipidemia     Lipitor trial was a failure  . Hypertension   . Diabetes mellitus   . High cholesterol   . Gastritis     Past Surgical History  Procedure Laterality Date  . Spine surgery  1995  . Tubal ligation    . Spine surgery      bone graft   . Cardiac catheterization  Augus 2007    normal coronaries,  EF 60%       The following portions of the patient's history were reviewed and updated as appropriate: Allergies, current medications, and problem list.    Review of Systems:   Patient denies headache, fevers, malaise, unintentional weight loss, skin rash, eye pain, sinus congestion and sinus pain, sore throat, dysphagia,  hemoptysis , cough, dyspnea, wheezing, chest pain, palpitations, orthopnea, edema, abdominal pain, nausea, melena, diarrhea, constipation, flank pain, dysuria, hematuria, urinary  Frequency, nocturia, numbness, tingling, seizures,  Focal weakness, Loss of consciousness,  Tremor, insomnia, depression, anxiety, and suicidal  ideation.     History   Social History  . Marital Status: Married    Spouse Name: N/A    Number of Children: N/A  . 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 Smoking status: Never Smoker   . Smokeless tobacco: Never Used  . Alcohol Use: No  . Drug Use: No  . Sexual Activity: Yes    Partners: Male   Other Topics Concern  . Not on file   Social History Narrative  . No narrative on file    Objective:  Filed Vitals:   11/08/13 0856  BP: 134/72  Pulse: 85  Temp: 97.4 F (36.3 C)  Resp: 16     General appearance: alert, cooperative and appears stated age Ears: normal TM's and external ear canals both ears Throat: lips, mucosa, and tongue normal; teeth and gums normal Neck: no adenopathy, no carotid bruit, supple, symmetrical, trachea midline and thyroid not enlarged, symmetric, no tenderness/mass/nodules Back: symmetric, no curvature. ROM normal. No CVA tenderness. Lungs: clear to auscultation bilaterally Heart: regular rate and rhythm, S1, S2 normal, no murmur, click, rub or gallop Abdomen: soft, non-tender; bowel sounds normal; no masses,  no organomegaly Pulses: 2+ and symmetric Skin: Skin color, texture, turgor normal. No rashes or lesions Lymph nodes: Cervical, supraclavicular, and axillary  nodes normal.  Assessment and Plan:  Diabetes mellitus type 2 in obese Improving control with low GI diet, glipizide  and exercise .  hemoglobin A1c is now 7. 5.  Dinner Post prandials are still elevated and she was advised to increase glipizide to 5 mg before dinner and contineu 2.5 mg before breakfast.  Patient is up to date on eye exam and foot exam was normal today.  There is  no proteinuria on current  micro urinalysis .  Fasting lipids have been evaluated  and statin therapy advised as indicated by application of new ACC guidelines based on patient's 10 year risk of CAD, but she is statin intolerant .  Lab  Results  Component Value Date   MICROALBUR 0.2 07/20/2013   Lab Results  Component Value Date   HGBA1C 7.5* 11/06/2013     Hyperlipidemia She was asked to resumed lipitor given cost of crestor but had recurrent muscle cramps that were too significant to continue.     Hypertension Well controlled on current regimen. Renal function stable, no changes today.  Lab Results  Component Value Date   CREATININE 0.7 11/06/2013   Lab Results  Component Value Date   NA 138 11/06/2013   K 4.5 11/06/2013   CL 103 11/06/2013   CO2 29 11/06/2013     Fatty liver disease, nonalcoholic Secondary to obesity and diabetes/metabolic syndrome.I have addressed  BMI and recommended a low glycemic index diet utilizing smaller more frequent meals to increase metabolism.  I have also recommended that patient start exercising with a goal of 30 minutes of aerobic exercise a minimum of 5 days per week.   Lab Results  Component Value Date   ALT 17 11/06/2013   AST 28 11/06/2013   ALKPHOS 80 11/06/2013   BILITOT 0.9 11/06/2013      Updated Medication List Outpatient Encounter Prescriptions as of 11/08/2013  Medication Sig  . aspirin 81 MG tablet Take 81 mg by mouth daily.    Marland Kitchen atorvastatin (LIPITOR) 80 MG tablet Take 1 tablet (80 mg total) by mouth daily.  . Calcium Carbonate-Vitamin D (CALCIUM + D) 600-200 MG-UNIT TABS Take by mouth 3 (three) times daily.    . Famotidine-Ca Carb-Mag Hydrox (PEPCID COMPLETE PO) Take by mouth as needed  . fish oil-omega-3 fatty acids 1000 MG capsule Take 2 g by mouth 2 (two) times daily.    Marland Kitchen glipiZIDE (GLUCOTROL) 5 MG tablet TAKE ONE-HALF (1/2) TABLET in aM and whole tablet in PM  BEFORE MEALS  . Glucose Blood (FREESTYLE LITE TEST VI) 3 strips by In Vitro route 3 (three) times daily.  . hyoscyamine (LEVSIN, ANASPAZ) 0.125 MG tablet Take 1 tablet (0.125 mg total) by mouth every 4 (four) hours as needed.  Marland Kitchen losartan (COZAAR) 50 MG tablet Take 1 tablet (50 mg total) by mouth  daily.  . Multiple Vitamin (MULTIVITAMIN) tablet Take 1 tablet by mouth daily.  . pantoprazole (PROTONIX) 40 MG tablet Take 1 tablet (40 mg total) by mouth daily.  . Tdap (BOOSTRIX) 5-2.5-18.5 LF-MCG/0.5 injection Inject 0.5 mLs into the muscle once.  . vitamin C (ASCORBIC ACID) 500 MG tablet Take 500 mg by mouth daily.  . vitamin E 100 UNIT capsule Take 100 Units by mouth daily.  . [DISCONTINUED] glipiZIDE (GLUCOTROL) 5 MG tablet TAKE ONE-HALF (1/2) TABLET TWICE DAILY BEFORE MEALS     Orders Placed This Encounter  Procedures  . Pneumococcal conjugate vaccine 13-valent  . HM PAP SMEAR  . HM DIABETES EYE  EXAM  . HM DIABETES FOOT EXAM  . HM COLONOSCOPY    No Follow-up on file.

## 2013-11-08 NOTE — Progress Notes (Signed)
Pre-visit discussion using our clinic review tool. No additional management support is needed unless otherwise documented below in the visit note.  

## 2013-11-10 NOTE — Assessment & Plan Note (Signed)
Secondary to obesity and diabetes/metabolic syndrome.I have addressed  BMI and recommended a low glycemic index diet utilizing smaller more frequent meals to increase metabolism.  I have also recommended that patient start exercising with a goal of 30 minutes of aerobic exercise a minimum of 5 days per week.   Lab Results  Component Value Date   ALT 17 11/06/2013   AST 28 11/06/2013   ALKPHOS 80 11/06/2013   BILITOT 0.9 11/06/2013

## 2013-11-10 NOTE — Assessment & Plan Note (Signed)
Well controlled on current regimen. Renal function stable, no changes today.  Lab Results  Component Value Date   CREATININE 0.7 11/06/2013   Lab Results  Component Value Date   NA 138 11/06/2013   K 4.5 11/06/2013   CL 103 11/06/2013   CO2 29 11/06/2013

## 2013-11-10 NOTE — Assessment & Plan Note (Signed)
She was asked to resumed lipitor given cost of crestor but had recurrent muscle cramps that were too significant to continue.

## 2013-12-04 ENCOUNTER — Encounter: Payer: Self-pay | Admitting: Podiatry

## 2013-12-04 ENCOUNTER — Ambulatory Visit (INDEPENDENT_AMBULATORY_CARE_PROVIDER_SITE_OTHER): Payer: 59 | Admitting: Podiatry

## 2013-12-04 DIAGNOSIS — M7750 Other enthesopathy of unspecified foot: Secondary | ICD-10-CM

## 2013-12-04 NOTE — Progress Notes (Signed)
She presents today stating that her orthotics are doing just great she has no problems whatsoever.  Objective: Vital signs are stable she is alert and oriented x3 no pain on palpation of the foot anywhere pulses are palpable bilateral.  Assessment: Plantar fasciitis forefoot metatarsalgia.  Plan continue use of the orthotics.

## 2013-12-11 ENCOUNTER — Other Ambulatory Visit: Payer: Self-pay | Admitting: *Deleted

## 2013-12-11 DIAGNOSIS — E669 Obesity, unspecified: Principal | ICD-10-CM

## 2013-12-11 DIAGNOSIS — E1169 Type 2 diabetes mellitus with other specified complication: Secondary | ICD-10-CM

## 2013-12-11 MED ORDER — ASPIRIN 81 MG PO TABS: 81.0000 mg | ORAL_TABLET | Freq: Every day | ORAL | Status: DC

## 2013-12-11 MED ORDER — GLIPIZIDE 5 MG PO TABS: ORAL_TABLET | ORAL | Status: DC

## 2013-12-21 ENCOUNTER — Other Ambulatory Visit: Payer: Self-pay | Admitting: *Deleted

## 2013-12-21 MED ORDER — GLUCOSE BLOOD VI STRP: ORAL_STRIP | Status: DC

## 2013-12-21 MED ORDER — LOSARTAN POTASSIUM 50 MG PO TABS: 50.0000 mg | ORAL_TABLET | Freq: Every day | ORAL | Status: DC

## 2014-01-04 ENCOUNTER — Other Ambulatory Visit: Payer: Self-pay | Admitting: *Deleted

## 2014-01-04 MED ORDER — PANTOPRAZOLE SODIUM 40 MG PO TBEC: 40.0000 mg | DELAYED_RELEASE_TABLET | Freq: Every day | ORAL | Status: DC

## 2014-01-11 DIAGNOSIS — Z1211 Encounter for screening for malignant neoplasm of colon: Secondary | ICD-10-CM | POA: Insufficient documentation

## 2014-01-29 LAB — HM COLONOSCOPY

## 2014-02-02 ENCOUNTER — Ambulatory Visit: Payer: Self-pay | Admitting: Gastroenterology

## 2014-02-05 LAB — PATHOLOGY REPORT

## 2014-02-06 ENCOUNTER — Telehealth: Payer: Self-pay | Admitting: *Deleted

## 2014-02-06 DIAGNOSIS — E119 Type 2 diabetes mellitus without complications: Secondary | ICD-10-CM

## 2014-02-06 NOTE — Telephone Encounter (Signed)
Pt is coming in tomorrow what labs and dx?  

## 2014-02-07 ENCOUNTER — Other Ambulatory Visit (INDEPENDENT_AMBULATORY_CARE_PROVIDER_SITE_OTHER): Payer: Medicare Other

## 2014-02-07 DIAGNOSIS — E119 Type 2 diabetes mellitus without complications: Secondary | ICD-10-CM

## 2014-02-07 LAB — COMPREHENSIVE METABOLIC PANEL
ALT: 19 U/L (ref 0–35)
AST: 29 U/L (ref 0–37)
Albumin: 3.8 g/dL (ref 3.5–5.2)
Alkaline Phosphatase: 89 U/L (ref 39–117)
BUN: 14 mg/dL (ref 6–23)
CO2: 27 mEq/L (ref 19–32)
Calcium: 9 mg/dL (ref 8.4–10.5)
Chloride: 104 mEq/L (ref 96–112)
Creatinine, Ser: 0.9 mg/dL (ref 0.4–1.2)
GFR: 64.73 mL/min (ref >60.00)
Glucose, Bld: 151 mg/dL — ABNORMAL HIGH (ref 70–99)
Potassium: 4.5 mEq/L (ref 3.5–5.1)
Sodium: 137 mEq/L (ref 135–145)
Total Bilirubin: 0.9 mg/dL (ref 0.2–1.2)
Total Protein: 7.4 g/dL (ref 6.0–8.3)

## 2014-02-07 LAB — LIPID PANEL
Cholesterol: 135 mg/dL (ref 0–200)
HDL: 44.9 mg/dL (ref >39.00)
LDL Cholesterol: 72 mg/dL (ref 0–99)
NonHDL: 90.1
Total CHOL/HDL Ratio: 3
Triglycerides: 92 mg/dL (ref 0.0–149.0)
VLDL: 18.4 mg/dL (ref 0.0–40.0)

## 2014-02-07 LAB — MICROALBUMIN / CREATININE URINE RATIO
Creatinine,U: 237.4 mg/dL
Microalb Creat Ratio: 0.3 mg/g (ref 0.0–30.0)
Microalb, Ur: 0.8 mg/dL (ref 0.0–1.9)

## 2014-02-07 LAB — HEMOGLOBIN A1C: Hgb A1c MFr Bld: 7.6 % — ABNORMAL HIGH (ref 4.6–6.5)

## 2014-02-09 ENCOUNTER — Ambulatory Visit: Payer: Medicare Other | Admitting: Internal Medicine

## 2014-02-13 ENCOUNTER — Encounter: Payer: Self-pay | Admitting: Internal Medicine

## 2014-02-13 ENCOUNTER — Ambulatory Visit (INDEPENDENT_AMBULATORY_CARE_PROVIDER_SITE_OTHER): Payer: Medicare Other | Admitting: Internal Medicine

## 2014-02-13 VITALS — BP 134/78 | HR 74 | Temp 97.8°F | Resp 16 | Ht 65.25 in | Wt 194.5 lb

## 2014-02-13 DIAGNOSIS — E785 Hyperlipidemia, unspecified: Secondary | ICD-10-CM

## 2014-02-13 DIAGNOSIS — E119 Type 2 diabetes mellitus without complications: Secondary | ICD-10-CM

## 2014-02-13 DIAGNOSIS — Z23 Encounter for immunization: Secondary | ICD-10-CM

## 2014-02-13 DIAGNOSIS — E669 Obesity, unspecified: Secondary | ICD-10-CM

## 2014-02-13 DIAGNOSIS — E1169 Type 2 diabetes mellitus with other specified complication: Secondary | ICD-10-CM

## 2014-02-13 DIAGNOSIS — Z13820 Encounter for screening for osteoporosis: Secondary | ICD-10-CM

## 2014-02-13 MED ORDER — METFORMIN HCL 500 MG PO TABS: 500.0000 mg | ORAL_TABLET | Freq: Two times a day (BID) | ORAL | Status: DC

## 2014-02-13 MED ORDER — GLIPIZIDE 5 MG PO TABS: ORAL_TABLET | ORAL | Status: DC

## 2014-02-13 NOTE — Assessment & Plan Note (Addendum)
Adding metformin 500 mg bid and stopping the morning dose of glipizide for aqc of 7.6 and recurrent hypoglycemia in the mid morning. Renal funciton is normal,  No proteinuria, and shenis intolerant of statins,   Lab Results  Component Value Date   HGBA1C 7.6* 02/07/2014   Lab Results  Component Value Date   MICROALBUR 0.8 02/07/2014   Lab Results  Component Value Date   CHOL 135 02/07/2014   HDL 44.90 02/07/2014   LDLCALC 72 02/07/2014   LDLDIRECT 77.8 06/14/2012   TRIG 92.0 02/07/2014   CHOLHDL 3 02/07/2014

## 2014-02-13 NOTE — Patient Instructions (Addendum)
Do not take the glipizide in the morning for the next two weeks.  Continue to take your blood sugar when you return from the gym you have been doing.   We are adding back metformin 500 mg twice daily    ChECK YOUR sugars twice daily :  Fasting   After whenever  you feel bad   DROP OFF YOUR BLOOD SUGARS IN ABOUT TWO WEEKS SO I CAN REVIEW THEM AND I WILL CONTACT YOU   WE HAVE ORDERED YOUR BONE DENSITY TEST

## 2014-02-13 NOTE — Progress Notes (Signed)
Patient ID: [name], female   DOB: 05-10-1946, 68 y.o.   MRN: [id number]   Patient Active Problem List   Diagnosis Date Noted  . Normal pap smear, positive high risk HPV 02/28/2013  . Gastritis   . Fatty liver disease, nonalcoholic 61/44/3154  . Chest pain at rest 07/07/2011  . Iron deficiency anemia 02/11/2011  . Diabetes mellitus type 2 in obese 02/11/2011  . Hypertension 02/11/2011  . Hyperlipidemia     Subjective:  CC:   Chief Complaint  Patient presents with  . Follow-up    Labs   . Diabetes    HPI:   [name] is a 68 y.o. female who presents for 3 month follow up on DM Type 2  She has been exercising 4 times per week in the morning after breakfast and notes that although her fasting sugars are 135 to 150,  She has mild hypoglycemia after she exercises ,  With lows in the 70's.  Breakfast is usually a low carb protein shake or cereal.  Her post prandial dinner sugars are 140 to 180 on some days ,  But others 90 to 130.   She is taking  2.5 mg glipizide at breakfast and 5 mg at dinner. a11c is 7.6    Past Medical History  Diagnosis Date  . Hyperlipidemia     Lipitor trial was a failure  . Hypertension   . Diabetes mellitus   . High cholesterol   . Gastritis     Past Surgical History  Procedure Laterality Date  . Spine surgery  1995  . Tubal ligation    . Spine surgery      bone graft   . Cardiac catheterization  Augus 2007    normal coronaries,  EF 60%       The following portions of the patient's history were reviewed and updated as appropriate: Allergies, current medications, and problem list.    Review of Systems:   Patient denies headache, fevers, malaise, unintentional weight loss, skin rash, eye pain, sinus congestion and sinus pain, sore throat, dysphagia,  hemoptysis , cough, dyspnea, wheezing, chest pain, palpitations, orthopnea, edema, abdominal pain, nausea, melena, diarrhea, constipation, flank pain, dysuria, hematuria, urinary   Frequency, nocturia, numbness, tingling, seizures,  Focal weakness, Loss of consciousness,  Tremor, insomnia, depression, anxiety, and suicidal ideation.     History   Social History  . Marital Status: Married    Spouse Name: N/A    Number of Children: N/A  . 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 Smoking status: Never Smoker   . Smokeless tobacco: Never Used  . Alcohol Use: No  . Drug Use: No  . Sexual Activity: Yes    Partners: Male   Other Topics Concern  . Not on file   Social History Narrative  . No narrative on file    Objective:  Filed Vitals:   02/13/14 1526  BP: 134/78  Pulse: 74  Temp: 97.8 F (36.6 C)  Resp: 16     General appearance: alert, cooperative and appears stated age Ears: normal TM's and external ear canals both ears Throat: lips, mucosa, and tongue normal; teeth and gums normal Neck: no adenopathy, no carotid bruit, supple, symmetrical, trachea midline and thyroid not enlarged, symmetric, no tenderness/mass/nodules Back: symmetric, no curvature. ROM normal. No CVA tenderness. Lungs: clear to auscultation bilaterally Heart: regular rate and  rhythm, S1, S2 normal, no murmur, click, rub or gallop Abdomen: soft, non-tender; bowel sounds normal; no masses,  no organomegaly Pulses: 2+ and symmetric Skin: Skin color, texture, turgor normal. No rashes or lesions Lymph nodes: Cervical, supraclavicular, and axillary nodes normal.  Assessment and Plan:  Diabetes mellitus type 2 in obese Adding metformin 500 mg bid and stopping the morning dose of glipizide for aqc of 7.6 and recurrent hypoglycemia in the mid morning. Renal funciton is normal,  No proteinuria, and shenis intolerant of statins,   Lab Results  Component Value Date   HGBA1C 7.6* 02/07/2014   Lab Results  Component Value Date   MICROALBUR 0.8 02/07/2014   Lab Results  Component Value Date   CHOL 135 02/07/2014   HDL  44.90 02/07/2014   LDLCALC 72 02/07/2014   LDLDIRECT 77.8 06/14/2012   TRIG 92.0 02/07/2014   CHOLHDL 3 02/07/2014     Hyperlipidemia She was asked to resumed lipitor given cost of crestor but had recurrent muscle cramps that were too significant to continue.  LDL is at goal on diet alone.  Lab Results  Component Value Date   CHOL 135 02/07/2014   HDL 44.90 02/07/2014   LDLCALC 72 02/07/2014   LDLDIRECT 77.8 06/14/2012   TRIG 92.0 02/07/2014   CHOLHDL 3 02/07/2014          Updated Medication List Outpatient Encounter Prescriptions as of 02/13/2014  Medication Sig  . aspirin 81 MG tablet Take 1 tablet (81 mg total) by mouth daily.  . Calcium Carbonate-Vitamin D (CALCIUM + D) 600-200 MG-UNIT TABS Take 1 tablet by mouth daily.   . Famotidine-Ca Carb-Mag Hydrox (PEPCID COMPLETE PO) Take by mouth as needed  . fish oil-omega-3 fatty acids 1000 MG capsule Take 2 g by mouth 2 (two) times daily.    Marland Kitchen glipiZIDE (GLUCOTROL) 5 MG tablet TAKE ONE-HALF (1/2) TABLET in aM and whole tablet in PM  BEFORE MEALS  . glucose blood (FREESTYLE LITE) test strip Use as instructed  . hyoscyamine (LEVSIN, ANASPAZ) 0.125 MG tablet Take 1 tablet (0.125 mg total) by mouth every 4 (four) hours as needed.  Marland Kitchen losartan (COZAAR) 50 MG tablet Take 1 tablet (50 mg total) by mouth daily.  . Multiple Vitamin (MULTIVITAMIN) tablet Take 1 tablet by mouth daily.  . pantoprazole (PROTONIX) 40 MG tablet Take 1 tablet (40 mg total) by mouth daily.  . Tdap (BOOSTRIX) 5-2.5-18.5 LF-MCG/0.5 injection Inject 0.5 mLs into the muscle once.  . vitamin C (ASCORBIC ACID) 500 MG tablet Take 500 mg by mouth daily.  . vitamin E 100 UNIT capsule Take 100 Units by mouth daily.  . [DISCONTINUED] glipiZIDE (GLUCOTROL) 5 MG tablet TAKE ONE-HALF (1/2) TABLET in aM and whole tablet in PM  BEFORE MEALS  . metFORMIN (GLUCOPHAGE) 500 MG tablet Take 1 tablet (500 mg total) by mouth 2 (two) times daily with a meal.     Orders Placed This Encounter   Procedures  . DG Bone Density  . HM COLONOSCOPY    Return in about 3 months (around 05/15/2014).

## 2014-02-13 NOTE — Progress Notes (Signed)
Pre-visit discussion using our clinic review tool. No additional management support is needed unless otherwise documented below in the visit note.  

## 2014-02-13 NOTE — Assessment & Plan Note (Signed)
She was asked to resumed lipitor given cost of crestor but had recurrent muscle cramps that were too significant to continue.  LDL is at goal on diet alone.  Lab Results  Component Value Date   CHOL 135 02/07/2014   HDL 44.90 02/07/2014   LDLCALC 72 02/07/2014   LDLDIRECT 77.8 06/14/2012   TRIG 92.0 02/07/2014   CHOLHDL 3 02/07/2014

## 2014-03-01 ENCOUNTER — Telehealth: Payer: Self-pay | Admitting: Internal Medicine

## 2014-03-01 NOTE — Telephone Encounter (Signed)
Pt dropped off blood sugar readings. List in Dr. Lupita Dawn box/msn

## 2014-03-01 NOTE — Telephone Encounter (Signed)
IN red folder 

## 2014-03-02 ENCOUNTER — Encounter: Payer: Self-pay | Admitting: Internal Medicine

## 2014-03-06 ENCOUNTER — Encounter: Payer: Self-pay | Admitting: Internal Medicine

## 2014-03-08 ENCOUNTER — Encounter: Payer: Self-pay | Admitting: Obstetrics & Gynecology

## 2014-03-08 ENCOUNTER — Other Ambulatory Visit (HOSPITAL_COMMUNITY)
Admission: RE | Admit: 2014-03-08 | Discharge: 2014-03-08 | Disposition: A | Discharge status: Home / Self Care | Payer: Medicare Other | Source: Ambulatory Visit | Attending: Obstetrics & Gynecology | Admitting: Obstetrics & Gynecology

## 2014-03-08 ENCOUNTER — Ambulatory Visit (INDEPENDENT_AMBULATORY_CARE_PROVIDER_SITE_OTHER): Payer: Medicare Other | Admitting: Obstetrics & Gynecology

## 2014-03-08 VITALS — BP 134/79 | HR 70 | Ht 65.5 in | Wt 195.0 lb

## 2014-03-08 DIAGNOSIS — R87810 Cervical high risk human papillomavirus (HPV) DNA test positive: Secondary | ICD-10-CM

## 2014-03-08 DIAGNOSIS — Z124 Encounter for screening for malignant neoplasm of cervix: Secondary | ICD-10-CM | POA: Insufficient documentation

## 2014-03-08 DIAGNOSIS — Z01419 Encounter for gynecological examination (general) (routine) without abnormal findings: Secondary | ICD-10-CM

## 2014-03-08 DIAGNOSIS — Z1151 Encounter for screening for human papillomavirus (HPV): Secondary | ICD-10-CM

## 2014-03-08 NOTE — Patient Instructions (Signed)
Preventive Care for Adults A healthy lifestyle and preventive care can promote health and wellness. Preventive health guidelines for women include the following key practices.  A routine yearly physical is a good way to check with your health care provider about your health and preventive screening. It is a chance to share any concerns and updates on your health and to receive a thorough exam.  Visit your dentist for a routine exam and preventive care every 6 months. Brush your teeth twice a day and floss once a day. Good oral hygiene prevents tooth decay and gum disease.  The frequency of eye exams is based on your age, health, family medical history, use of contact lenses, and other factors. Follow your health care provider's recommendations for frequency of eye exams.  Eat a healthy diet. Foods like vegetables, fruits, whole grains, low-fat dairy products, and lean protein foods contain the nutrients you need without too many calories. Decrease your intake of foods high in solid fats, added sugars, and salt. Eat the right amount of calories for you.Get information about a proper diet from your health care provider, if necessary.  Regular physical exercise is one of the most important things you can do for your health. Most adults should get at least 150 minutes of moderate-intensity exercise (any activity that increases your heart rate and causes you to sweat) each week. In addition, most adults need muscle-strengthening exercises on 2 or more days a week.  Maintain a healthy weight. The body mass index (BMI) is a screening tool to identify possible weight problems. It provides an estimate of body fat based on height and weight. Your health care provider can find your BMI and can help you achieve or maintain a healthy weight.For adults 20 years and older:  A BMI below 18.5 is considered underweight.  A BMI of 18.5 to 24.9 is normal.  A BMI of 25 to 29.9 is considered overweight.  A BMI of  30 and above is considered obese.  Maintain normal blood lipids and cholesterol levels by exercising and minimizing your intake of saturated fat. Eat a balanced diet with plenty of fruit and vegetables. Blood tests for lipids and cholesterol should begin at age 76 and be repeated every 5 years. If your lipid or cholesterol levels are high, you are over 50, or you are at high risk for heart disease, you may need your cholesterol levels checked more frequently.Ongoing high lipid and cholesterol levels should be treated with medicines if diet and exercise are not working.  If you smoke, find out from your health care provider how to quit. If you do not use tobacco, do not start.  Lung cancer screening is recommended for adults aged 22-80 years who are at high risk for developing lung cancer because of a history of smoking. A yearly low-dose CT scan of the lungs is recommended for people who have at least a 30-pack-year history of smoking and are a current smoker or have quit within the past 15 years. A pack year of smoking is smoking an average of 1 pack of cigarettes a day for 1 year (for example: 1 pack a day for 30 years or 2 packs a day for 15 years). Yearly screening should continue until the smoker has stopped smoking for at least 15 years. Yearly screening should be stopped for people who develop a health problem that would prevent them from having lung cancer treatment.  If you are pregnant, do not drink alcohol. If you are breastfeeding,  be very cautious about drinking alcohol. If you are not pregnant and choose to drink alcohol, do not have more than 1 drink per day. One drink is considered to be 12 ounces (355 mL) of beer, 5 ounces (148 mL) of wine, or 1.5 ounces (44 mL) of liquor.  Avoid use of street drugs. Do not share needles with anyone. Ask for help if you need support or instructions about stopping the use of drugs.  High blood pressure causes heart disease and increases the risk of  stroke. Your blood pressure should be checked at least every 1 to 2 years. Ongoing high blood pressure should be treated with medicines if weight loss and exercise do not work.  If you are 75-52 years old, ask your health care provider if you should take aspirin to prevent strokes.  Diabetes screening involves taking a blood sample to check your fasting blood sugar level. This should be done once every 3 years, after age 15, if you are within normal weight and without risk factors for diabetes. Testing should be considered at a younger age or be carried out more frequently if you are overweight and have at least 1 risk factor for diabetes.  Breast cancer screening is essential preventive care for women. You should practice "breast self-awareness." This means understanding the normal appearance and feel of your breasts and may include breast self-examination. Any changes detected, no matter how small, should be reported to a health care provider. Women in their 58s and 30s should have a clinical breast exam (CBE) by a health care provider as part of a regular health exam every 1 to 3 years. After age 16, women should have a CBE every year. Starting at age 53, women should consider having a mammogram (breast X-ray test) every year. Women who have a family history of breast cancer should talk to their health care provider about genetic screening. Women at a high risk of breast cancer should talk to their health care providers about having an MRI and a mammogram every year.  Breast cancer gene (BRCA)-related cancer risk assessment is recommended for women who have family members with BRCA-related cancers. BRCA-related cancers include breast, ovarian, tubal, and peritoneal cancers. Having family members with these cancers may be associated with an increased risk for harmful changes (mutations) in the breast cancer genes BRCA1 and BRCA2. Results of the assessment will determine the need for genetic counseling and  BRCA1 and BRCA2 testing.  Routine pelvic exams to screen for cancer are no longer recommended for nonpregnant women who are considered low risk for cancer of the pelvic organs (ovaries, uterus, and vagina) and who do not have symptoms. Ask your health care provider if a screening pelvic exam is right for you.  If you have had past treatment for cervical cancer or a condition that could lead to cancer, you need Pap tests and screening for cancer for at least 20 years after your treatment. If Pap tests have been discontinued, your risk factors (such as having a new sexual partner) need to be reassessed to determine if screening should be resumed. Some women have medical problems that increase the chance of getting cervical cancer. In these cases, your health care provider may recommend more frequent screening and Pap tests.  The HPV test is an additional test that may be used for cervical cancer screening. The HPV test looks for the virus that can cause the cell changes on the cervix. The cells collected during the Pap test can be  tested for HPV. The HPV test could be used to screen women aged 30 years and older, and should be used in women of any age who have unclear Pap test results. After the age of 30, women should have HPV testing at the same frequency as a Pap test.  Colorectal cancer can be detected and often prevented. Most routine colorectal cancer screening begins at the age of 50 years and continues through age 75 years. However, your health care provider may recommend screening at an earlier age if you have risk factors for colon cancer. On a yearly basis, your health care provider may provide home test kits to check for hidden blood in the stool. Use of a small camera at the end of a tube, to directly examine the colon (sigmoidoscopy or colonoscopy), can detect the earliest forms of colorectal cancer. Talk to your health care provider about this at age 50, when routine screening begins. Direct  exam of the colon should be repeated every 5-10 years through age 75 years, unless early forms of pre-cancerous polyps or small growths are found.  People who are at an increased risk for hepatitis B should be screened for this virus. You are considered at high risk for hepatitis B if:  You were born in a country where hepatitis B occurs often. Talk with your health care provider about which countries are considered high risk.  Your parents were born in a high-risk country and you have not received a shot to protect against hepatitis B (hepatitis B vaccine).  You have HIV or AIDS.  You use needles to inject street drugs.  You live with, or have sex with, someone who has hepatitis B.  You get hemodialysis treatment.  You take certain medicines for conditions like cancer, organ transplantation, and autoimmune conditions.  Hepatitis C blood testing is recommended for all people born from 1945 through 1965 and any individual with known risks for hepatitis C.  Practice safe sex. Use condoms and avoid high-risk sexual practices to reduce the spread of sexually transmitted infections (STIs). STIs include gonorrhea, chlamydia, syphilis, trichomonas, herpes, HPV, and human immunodeficiency virus (HIV). Herpes, HIV, and HPV are viral illnesses that have no cure. They can result in disability, cancer, and death.  You should be screened for sexually transmitted illnesses (STIs) including gonorrhea and chlamydia if:  You are sexually active and are younger than 24 years.  You are older than 24 years and your health care provider tells you that you are at risk for this type of infection.  Your sexual activity has changed since you were last screened and you are at an increased risk for chlamydia or gonorrhea. Ask your health care provider if you are at risk.  If you are at risk of being infected with HIV, it is recommended that you take a prescription medicine daily to prevent HIV infection. This is  called preexposure prophylaxis (PrEP). You are considered at risk if:  You are a heterosexual woman, are sexually active, and are at increased risk for HIV infection.  You take drugs by injection.  You are sexually active with a partner who has HIV.  Talk with your health care provider about whether you are at high risk of being infected with HIV. If you choose to begin PrEP, you should first be tested for HIV. You should then be tested every 3 months for as long as you are taking PrEP.  Osteoporosis is a disease in which the bones lose minerals and strength   with aging. This can result in serious bone fractures or breaks. The risk of osteoporosis can be identified using a bone density scan. Women ages 65 years and over and women at risk for fractures or osteoporosis should discuss screening with their health care providers. Ask your health care provider whether you should take a calcium supplement or vitamin D to reduce the rate of osteoporosis.  Menopause can be associated with physical symptoms and risks. Hormone replacement therapy is available to decrease symptoms and risks. You should talk to your health care provider about whether hormone replacement therapy is right for you.  Use sunscreen. Apply sunscreen liberally and repeatedly throughout the day. You should seek shade when your shadow is shorter than you. Protect yourself by wearing long sleeves, pants, a wide-brimmed hat, and sunglasses year round, whenever you are outdoors.  Once a month, do a whole body skin exam, using a mirror to look at the skin on your back. Tell your health care provider of new moles, moles that have irregular borders, moles that are larger than a pencil eraser, or moles that have changed in shape or color.  Stay current with required vaccines (immunizations).  Influenza vaccine. All adults should be immunized every year.  Tetanus, diphtheria, and acellular pertussis (Td, Tdap) vaccine. Pregnant women should  receive 1 dose of Tdap vaccine during each pregnancy. The dose should be obtained regardless of the length of time since the last dose. Immunization is preferred during the 27th-36th week of gestation. An adult who has not previously received Tdap or who does not know her vaccine status should receive 1 dose of Tdap. This initial dose should be followed by tetanus and diphtheria toxoids (Td) booster doses every 10 years. Adults with an unknown or incomplete history of completing a 3-dose immunization series with Td-containing vaccines should begin or complete a primary immunization series including a Tdap dose. Adults should receive a Td booster every 10 years.  Varicella vaccine. An adult without evidence of immunity to varicella should receive 2 doses or a second dose if she has previously received 1 dose. Pregnant females who do not have evidence of immunity should receive the first dose after pregnancy. This first dose should be obtained before leaving the health care facility. The second dose should be obtained 4-8 weeks after the first dose.  Human papillomavirus (HPV) vaccine. Females aged 13-26 years who have not received the vaccine previously should obtain the 3-dose series. The vaccine is not recommended for use in pregnant females. However, pregnancy testing is not needed before receiving a dose. If a female is found to be pregnant after receiving a dose, no treatment is needed. In that case, the remaining doses should be delayed until after the pregnancy. Immunization is recommended for any person with an immunocompromised condition through the age of 26 years if she did not get any or all doses earlier. During the 3-dose series, the second dose should be obtained 4-8 weeks after the first dose. The third dose should be obtained 24 weeks after the first dose and 16 weeks after the second dose.  Zoster vaccine. One dose is recommended for adults aged 60 years or older unless certain conditions are  present.  Measles, mumps, and rubella (MMR) vaccine. Adults born before 1957 generally are considered immune to measles and mumps. Adults born in 1957 or later should have 1 or more doses of MMR vaccine unless there is a contraindication to the vaccine or there is laboratory evidence of immunity to   each of the three diseases. A routine second dose of MMR vaccine should be obtained at least 28 days after the first dose for students attending postsecondary schools, health care workers, or international travelers. People who received inactivated measles vaccine or an unknown type of measles vaccine during 1963-1967 should receive 2 doses of MMR vaccine. People who received inactivated mumps vaccine or an unknown type of mumps vaccine before 1979 and are at high risk for mumps infection should consider immunization with 2 doses of MMR vaccine. For females of childbearing age, rubella immunity should be determined. If there is no evidence of immunity, females who are not pregnant should be vaccinated. If there is no evidence of immunity, females who are pregnant should delay immunization until after pregnancy. Unvaccinated health care workers born before 1957 who lack laboratory evidence of measles, mumps, or rubella immunity or laboratory confirmation of disease should consider measles and mumps immunization with 2 doses of MMR vaccine or rubella immunization with 1 dose of MMR vaccine.  Pneumococcal 13-valent conjugate (PCV13) vaccine. When indicated, a person who is uncertain of her immunization history and has no record of immunization should receive the PCV13 vaccine. An adult aged 19 years or older who has certain medical conditions and has not been previously immunized should receive 1 dose of PCV13 vaccine. This PCV13 should be followed with a dose of pneumococcal polysaccharide (PPSV23) vaccine. The PPSV23 vaccine dose should be obtained at least 8 weeks after the dose of PCV13 vaccine. An adult aged 19  years or older who has certain medical conditions and previously received 1 or more doses of PPSV23 vaccine should receive 1 dose of PCV13. The PCV13 vaccine dose should be obtained 1 or more years after the last PPSV23 vaccine dose.  Pneumococcal polysaccharide (PPSV23) vaccine. When PCV13 is also indicated, PCV13 should be obtained first. All adults aged 65 years and older should be immunized. An adult younger than age 65 years who has certain medical conditions should be immunized. Any person who resides in a nursing home or long-term care facility should be immunized. An adult smoker should be immunized. People with an immunocompromised condition and certain other conditions should receive both PCV13 and PPSV23 vaccines. People with human immunodeficiency virus (HIV) infection should be immunized as soon as possible after diagnosis. Immunization during chemotherapy or radiation therapy should be avoided. Routine use of PPSV23 vaccine is not recommended for American Indians, [place] Natives, or people younger than 65 years unless there are medical conditions that require PPSV23 vaccine. When indicated, people who have unknown immunization and have no record of immunization should receive PPSV23 vaccine. One-time revaccination 5 years after the first dose of PPSV23 is recommended for people aged 19-64 years who have chronic kidney failure, nephrotic syndrome, asplenia, or immunocompromised conditions. People who received 1-2 doses of PPSV23 before age 65 years should receive another dose of PPSV23 vaccine at age 65 years or later if at least 5 years have passed since the previous dose. Doses of PPSV23 are not needed for people immunized with PPSV23 at or after age 65 years.  Meningococcal vaccine. Adults with asplenia or persistent complement component deficiencies should receive 2 doses of quadrivalent meningococcal conjugate (MenACWY-D) vaccine. The doses should be obtained at least 2 months apart.  Microbiologists working with certain meningococcal bacteria, military recruits, people at risk during an outbreak, and people who travel to or live in countries with a high rate of meningitis should be immunized. A first-year college student up through age   21 years who is living in a residence hall should receive a dose if she did not receive a dose on or after her 16th birthday. Adults who have certain high-risk conditions should receive one or more doses of vaccine.  Hepatitis A vaccine. Adults who wish to be protected from this disease, have certain high-risk conditions, work with hepatitis A-infected animals, work in hepatitis A research labs, or travel to or work in countries with a high rate of hepatitis A should be immunized. Adults who were previously unvaccinated and who anticipate close contact with an international adoptee during the first 60 days after arrival in the [place] from a country with a high rate of hepatitis A should be immunized.  Hepatitis B vaccine. Adults who wish to be protected from this disease, have certain high-risk conditions, may be exposed to blood or other infectious body fluids, are household contacts or sex partners of hepatitis B positive people, are clients or workers in certain care facilities, or travel to or work in countries with a high rate of hepatitis B should be immunized.  Haemophilus influenzae type b (Hib) vaccine. A previously unvaccinated person with asplenia or sickle cell disease or having a scheduled splenectomy should receive 1 dose of Hib vaccine. Regardless of previous immunization, a recipient of a hematopoietic stem cell transplant should receive a 3-dose series 6-12 months after her successful transplant. Hib vaccine is not recommended for adults with HIV infection. Preventive Services / Frequency Ages 64 to 68 years  Blood pressure check.** / Every 1 to 2 years.  Lipid and cholesterol check.** / Every 5 years beginning at age  22.  Clinical breast exam.** / Every 3 years for women in their 88s and 53s.  BRCA-related cancer risk assessment.** / For women who have family members with a BRCA-related cancer (breast, ovarian, tubal, or peritoneal cancers).  Pap test.** / Every 2 years from ages 90 through 51. Every 3 years starting at age 21 through age 56 or 3 with a history of 3 consecutive normal Pap tests.  HPV screening.** / Every 3 years from ages 24 through ages 1 to 46 with a history of 3 consecutive normal Pap tests.  Hepatitis C blood test.** / For any individual with known risks for hepatitis C.  Skin self-exam. / Monthly.  Influenza vaccine. / Every year.  Tetanus, diphtheria, and acellular pertussis (Tdap, Td) vaccine.** / Consult your health care provider. Pregnant women should receive 1 dose of Tdap vaccine during each pregnancy. 1 dose of Td every 10 years.  Varicella vaccine.** / Consult your health care provider. Pregnant females who do not have evidence of immunity should receive the first dose after pregnancy.  HPV vaccine. / 3 doses over 6 months, if 72 and younger. The vaccine is not recommended for use in pregnant females. However, pregnancy testing is not needed before receiving a dose.  Measles, mumps, rubella (MMR) vaccine.** / You need at least 1 dose of MMR if you were born in 1957 or later. You may also need a 2nd dose. For females of childbearing age, rubella immunity should be determined. If there is no evidence of immunity, females who are not pregnant should be vaccinated. If there is no evidence of immunity, females who are pregnant should delay immunization until after pregnancy.  Pneumococcal 13-valent conjugate (PCV13) vaccine.** / Consult your health care provider.  Pneumococcal polysaccharide (PPSV23) vaccine.** / 1 to 2 doses if you smoke cigarettes or if you have certain conditions.  Meningococcal vaccine.** /  1 dose if you are age 19 to 21 years and a first-year college  student living in a residence hall, or have one of several medical conditions, you need to get vaccinated against meningococcal disease. You may also need additional booster doses.  Hepatitis A vaccine.** / Consult your health care provider.  Hepatitis B vaccine.** / Consult your health care provider.  Haemophilus influenzae type b (Hib) vaccine.** / Consult your health care provider. Ages 40 to 64 years  Blood pressure check.** / Every 1 to 2 years.  Lipid and cholesterol check.** / Every 5 years beginning at age 20 years.  Lung cancer screening. / Every year if you are aged 55-80 years and have a 30-pack-year history of smoking and currently smoke or have quit within the past 15 years. Yearly screening is stopped once you have quit smoking for at least 15 years or develop a health problem that would prevent you from having lung cancer treatment.  Clinical breast exam.** / Every year after age 40 years.  BRCA-related cancer risk assessment.** / For women who have family members with a BRCA-related cancer (breast, ovarian, tubal, or peritoneal cancers).  Mammogram.** / Every year beginning at age 40 years and continuing for as long as you are in good health. Consult with your health care provider.  Pap test.** / Every 3 years starting at age 30 years through age 65 or 70 years with a history of 3 consecutive normal Pap tests.  HPV screening.** / Every 3 years from ages 30 years through ages 65 to 70 years with a history of 3 consecutive normal Pap tests.  Fecal occult blood test (FOBT) of stool. / Every year beginning at age 50 years and continuing until age 75 years. You may not need to do this test if you get a colonoscopy every 10 years.  Flexible sigmoidoscopy or colonoscopy.** / Every 5 years for a flexible sigmoidoscopy or every 10 years for a colonoscopy beginning at age 50 years and continuing until age 75 years.  Hepatitis C blood test.** / For all people born from 1945 through  1965 and any individual with known risks for hepatitis C.  Skin self-exam. / Monthly.  Influenza vaccine. / Every year.  Tetanus, diphtheria, and acellular pertussis (Tdap/Td) vaccine.** / Consult your health care provider. Pregnant women should receive 1 dose of Tdap vaccine during each pregnancy. 1 dose of Td every 10 years.  Varicella vaccine.** / Consult your health care provider. Pregnant females who do not have evidence of immunity should receive the first dose after pregnancy.  Zoster vaccine.** / 1 dose for adults aged 60 years or older.  Measles, mumps, rubella (MMR) vaccine.** / You need at least 1 dose of MMR if you were born in 1957 or later. You may also need a 2nd dose. For females of childbearing age, rubella immunity should be determined. If there is no evidence of immunity, females who are not pregnant should be vaccinated. If there is no evidence of immunity, females who are pregnant should delay immunization until after pregnancy.  Pneumococcal 13-valent conjugate (PCV13) vaccine.** / Consult your health care provider.  Pneumococcal polysaccharide (PPSV23) vaccine.** / 1 to 2 doses if you smoke cigarettes or if you have certain conditions.  Meningococcal vaccine.** / Consult your health care provider.  Hepatitis A vaccine.** / Consult your health care provider.  Hepatitis B vaccine.** / Consult your health care provider.  Haemophilus influenzae type b (Hib) vaccine.** / Consult your health care provider. Ages 65   years and over  Blood pressure check.** / Every 1 to 2 years.  Lipid and cholesterol check.** / Every 5 years beginning at age 22 years.  Lung cancer screening. / Every year if you are aged 73-80 years and have a 30-pack-year history of smoking and currently smoke or have quit within the past 15 years. Yearly screening is stopped once you have quit smoking for at least 15 years or develop a health problem that would prevent you from having lung cancer  treatment.  Clinical breast exam.** / Every year after age 4 years.  BRCA-related cancer risk assessment.** / For women who have family members with a BRCA-related cancer (breast, ovarian, tubal, or peritoneal cancers).  Mammogram.** / Every year beginning at age 40 years and continuing for as long as you are in good health. Consult with your health care provider.  Pap test.** / Every 3 years starting at age 9 years through age 34 or 91 years with 3 consecutive normal Pap tests. Testing can be stopped between 65 and 70 years with 3 consecutive normal Pap tests and no abnormal Pap or HPV tests in the past 10 years.  HPV screening.** / Every 3 years from ages 57 years through ages 64 or 45 years with a history of 3 consecutive normal Pap tests. Testing can be stopped between 65 and 70 years with 3 consecutive normal Pap tests and no abnormal Pap or HPV tests in the past 10 years.  Fecal occult blood test (FOBT) of stool. / Every year beginning at age 15 years and continuing until age 17 years. You may not need to do this test if you get a colonoscopy every 10 years.  Flexible sigmoidoscopy or colonoscopy.** / Every 5 years for a flexible sigmoidoscopy or every 10 years for a colonoscopy beginning at age 86 years and continuing until age 71 years.  Hepatitis C blood test.** / For all people born from [birth year] through 1964 and any individual with known risks for hepatitis C.  Osteoporosis screening.** / A one-time screening for women ages 83 years and over and women at risk for fractures or osteoporosis.  Skin self-exam. / Monthly.  Influenza vaccine. / Every year.  Tetanus, diphtheria, and acellular pertussis (Tdap/Td) vaccine.** / 1 dose of Td every 10 years.  Varicella vaccine.** / Consult your health care provider.  Zoster vaccine.** / 1 dose for adults aged 61 years or older.  Pneumococcal 13-valent conjugate (PCV13) vaccine.** / Consult your health care provider.  Pneumococcal  polysaccharide (PPSV23) vaccine.** / 1 dose for all adults aged 28 years and older.  Meningococcal vaccine.** / Consult your health care provider.  Hepatitis A vaccine.** / Consult your health care provider.  Hepatitis B vaccine.** / Consult your health care provider.  Haemophilus influenzae type b (Hib) vaccine.** / Consult your health care provider. ** Family history and personal history of risk and conditions may change your health care provider's recommendations. Document Released: 06/30/2001 Document Revised: 09/18/2013 Document Reviewed: 09/29/2010 [facility] Patient Information 2015 [place], [place]. This information is not intended to replace advice given to you by your health care provider. Make sure you discuss any questions you have with your health care provider.

## 2014-03-08 NOTE — Progress Notes (Signed)
    GYNECOLOGY CLINIC ANNUAL PREVENTATIVE CARE ENCOUNTER NOTE  Subjective:     [name] is a 68 y.o. [phone] female here for a routine annual gynecologic exam.  Current complaints: none. Sexually active, no problems.     Gynecologic History No LMP recorded. Patient is postmenopausal. Last Pap: 02/28/13. Normal pap smear cytology but positive HRHPV. Last mammogram: 02/2013. Results were: normal Colonoscopy 01/2014: Benign polyps, repeat in 10 years  Obstetric History OB History  Gravida Para Term Preterm AB SAB TAB Ectopic Multiple Living  2 2 2       2     # Outcome Date GA Lbr Len/2nd Weight Sex Delivery Anes PTL Lv  2 TRM     M SVD     1 TRM     M SVD        The following portions of the patient's history were reviewed and updated as appropriate: allergies, current medications, past family history, past medical history, past social history, past surgical history and problem list.  Review of Systems Pertinent items are noted in HPI.    Objective:   BP 134/79  Pulse 70  Ht 5' 5.5" (1.664 m)  Wt 195 lb (88.451 kg)  BMI 31.94 kg/m2 GENERAL: Well-developed, obese female in no acute distress.  HEENT: Normocephalic, atraumatic. Sclerae anicteric.  NECK: Supple. Normal thyroid.  LUNGS: Clear to auscultation bilaterally.  HEART: Regular rate and rhythm.  BREASTS: Large, symmetric in size. No masses, skin changes, nipple drainage, or lymphadenopathy.  ABDOMEN: Soft, nontender, nondistended. No organomegaly palpated.  PELVIC: Normal external female genitalia. Vagina is pink and rugated with mild atrophy. Mild cystocele noted. Normal discharge. Normal cervix contour. Pap smear obtained. Unable to palpate uterus or adnexa secondary to habitus  EXTREMITIES: No cyanosis, clubbing, or edema, 2+ distal pulses.   Assessment:   Annual gynecologic examination Normal pap smear cytology but positive HRHPV last year   Plan:   Pap done, will follow up results and manage  accordingly. Mammogram scheduled Routine preventative health maintenance measures emphasized; already received flu vaccine.   Verita Schneiders, MD, [place] Attending [place] for [employer], [place]

## 2014-03-09 LAB — CYTOLOGY - PAP

## 2014-03-11 ENCOUNTER — Telehealth: Payer: Self-pay | Admitting: Internal Medicine

## 2014-03-11 ENCOUNTER — Encounter: Payer: Self-pay | Admitting: Obstetrics & Gynecology

## 2014-03-11 DIAGNOSIS — E669 Obesity, unspecified: Principal | ICD-10-CM

## 2014-03-11 DIAGNOSIS — E1169 Type 2 diabetes mellitus with other specified complication: Secondary | ICD-10-CM

## 2014-03-11 NOTE — Assessment & Plan Note (Signed)
Improved post prandials and normal fastings since the end of september

## 2014-03-11 NOTE — Addendum Note (Signed)
Addended by: Verita Schneiders A on: 03/11/2014 02:16 PM   Modules accepted: Orders

## 2014-03-11 NOTE — Progress Notes (Signed)
Quick Note:  Normal pap smear but positive high-risk HPV again on 03/08/14 (Second consecutive result). Given second positive HRHPV, will send for HPV 16/18 typing. If positive, she will need colposcopy. If negative, will repeat cotesting in one year again. Problem list updated. Please call to inform patient of results and recommendations. ______

## 2014-03-11 NOTE — Telephone Encounter (Signed)
Bone density test was ordered on Sept 29th, Please forward request to [facility] for processing  Blood sugars are excellent,  Improved post prandials and normal fastings since the end of September.  No changes needed

## 2014-03-12 NOTE — Telephone Encounter (Signed)
Patient  Notified of result of CBG's

## 2014-03-13 ENCOUNTER — Encounter: Payer: Self-pay | Admitting: Internal Medicine

## 2014-03-16 ENCOUNTER — Encounter: Payer: Self-pay | Admitting: Obstetrics & Gynecology

## 2014-03-19 ENCOUNTER — Encounter: Payer: Self-pay | Admitting: Obstetrics & Gynecology

## 2014-03-20 LAB — HM MAMMOGRAPHY: HM Mammogram: NEGATIVE

## 2014-03-21 ENCOUNTER — Encounter: Payer: Self-pay | Admitting: *Deleted

## 2014-04-15 ENCOUNTER — Other Ambulatory Visit: Payer: Self-pay | Admitting: Internal Medicine

## 2014-04-16 ENCOUNTER — Other Ambulatory Visit: Payer: Self-pay | Admitting: *Deleted

## 2014-04-16 MED ORDER — ATORVASTATIN CALCIUM 80 MG PO TABS: 80.0000 mg | ORAL_TABLET | Freq: Every day | ORAL | Status: DC

## 2014-04-19 ENCOUNTER — Encounter: Payer: Self-pay | Admitting: Internal Medicine

## 2014-04-23 ENCOUNTER — Ambulatory Visit (INDEPENDENT_AMBULATORY_CARE_PROVIDER_SITE_OTHER): Payer: 59 | Admitting: Podiatry

## 2014-04-23 VITALS — BP 134/78 | HR 71 | Resp 16

## 2014-04-23 DIAGNOSIS — D239 Other benign neoplasm of skin, unspecified: Secondary | ICD-10-CM

## 2014-04-23 DIAGNOSIS — Q828 Other specified congenital malformations of skin: Secondary | ICD-10-CM

## 2014-04-23 NOTE — Progress Notes (Signed)
She presents today with a chief complaint painful lesion to the plantar lateral aspect of her right foot. She denies trauma.  Objective: Vital signs are stable she is alert and oriented 3 pulses are palpable. No calf pain. Solitary porokeratotic lesion sub-fifth metatarsal base right foot. I debrided this today without bleeding.  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  Plan: Mechanical debridement followed by chemical debridement under occlusion to follow up with her in a few weeks if necessary.

## 2014-05-14 ENCOUNTER — Telehealth: Payer: Self-pay | Admitting: *Deleted

## 2014-05-14 ENCOUNTER — Other Ambulatory Visit (INDEPENDENT_AMBULATORY_CARE_PROVIDER_SITE_OTHER): Payer: Medicare Other

## 2014-05-14 DIAGNOSIS — E119 Type 2 diabetes mellitus without complications: Secondary | ICD-10-CM

## 2014-05-14 DIAGNOSIS — Z79899 Other long term (current) drug therapy: Secondary | ICD-10-CM

## 2014-05-14 DIAGNOSIS — R5383 Other fatigue: Secondary | ICD-10-CM

## 2014-05-14 LAB — COMPREHENSIVE METABOLIC PANEL
ALT: 18 U/L (ref 0–35)
AST: 24 U/L (ref 0–37)
Albumin: 3.7 g/dL (ref 3.5–5.2)
Alkaline Phosphatase: 92 U/L (ref 39–117)
BUN: 13 mg/dL (ref 6–23)
CO2: 29 mEq/L (ref 19–32)
Calcium: 9 mg/dL (ref 8.4–10.5)
Chloride: 104 mEq/L (ref 96–112)
Creatinine, Ser: 0.6 mg/dL (ref 0.4–1.2)
GFR: 107.99 mL/min (ref >60.00)
Glucose, Bld: 163 mg/dL — ABNORMAL HIGH (ref 70–99)
Potassium: 4.4 mEq/L (ref 3.5–5.1)
Sodium: 139 mEq/L (ref 135–145)
Total Bilirubin: 0.7 mg/dL (ref 0.2–1.2)
Total Protein: 6.7 g/dL (ref 6.0–8.3)

## 2014-05-14 LAB — LIPID PANEL
Cholesterol: 141 mg/dL (ref 0–200)
HDL: 42.3 mg/dL (ref >39.00)
LDL Cholesterol: 76 mg/dL (ref 0–99)
NonHDL: 98.7
Total CHOL/HDL Ratio: 3
Triglycerides: 116 mg/dL (ref 0.0–149.0)
VLDL: 23.2 mg/dL (ref 0.0–40.0)

## 2014-05-14 LAB — TSH: TSH: 2.12 u[IU]/mL (ref 0.35–4.50)

## 2014-05-14 LAB — HEMOGLOBIN A1C: Hgb A1c MFr Bld: 7.7 % — ABNORMAL HIGH (ref 4.6–6.5)

## 2014-05-14 NOTE — Telephone Encounter (Signed)
What labs and dx?  

## 2014-05-16 ENCOUNTER — Ambulatory Visit: Payer: Medicare Other | Admitting: Internal Medicine

## 2014-05-16 ENCOUNTER — Telehealth: Payer: Self-pay | Admitting: Internal Medicine

## 2014-05-16 DIAGNOSIS — E109 Type 1 diabetes mellitus without complications: Secondary | ICD-10-CM

## 2014-05-16 MED ORDER — ONETOUCH VERIO IQ SYSTEM W/DEVICE KIT: 1.0000 | PACK | Freq: Once | Status: DC

## 2014-05-16 NOTE — Telephone Encounter (Signed)
Pt arrived 21 mins late for appt. Pt advise that she will need to reschedule appt. Appt reschedule to 1/27. Pt state that she needed to speak with Dr. Derrel Nip about an rx for a meter. Pt left letter that she received from insurance company. Letter in Dr. Lupita Dawn [email or web address]

## 2014-05-16 NOTE — Telephone Encounter (Signed)
Yes ok to send rx

## 2014-05-16 NOTE — Telephone Encounter (Signed)
Sent script for glucometer to pharmacy as requested. Left message for patient.

## 2014-05-16 NOTE — Telephone Encounter (Signed)
Letter stated she is qualified for the Veri-Q ok to send RX?

## 2014-05-25 ENCOUNTER — Encounter: Payer: Self-pay | Admitting: Nurse Practitioner

## 2014-05-25 ENCOUNTER — Ambulatory Visit (INDEPENDENT_AMBULATORY_CARE_PROVIDER_SITE_OTHER): Payer: Medicare Other | Admitting: Nurse Practitioner

## 2014-05-25 VITALS — BP 118/64 | HR 83 | Temp 97.8°F | Resp 12 | Ht 65.0 in | Wt 191.8 lb

## 2014-05-25 DIAGNOSIS — M791 Myalgia, unspecified site: Secondary | ICD-10-CM

## 2014-05-25 LAB — CK: Total CK: 95 U/L (ref 7–177)

## 2014-05-25 LAB — SEDIMENTATION RATE: Sed Rate: 43 mm/hr — ABNORMAL HIGH (ref 0–22)

## 2014-05-25 MED ORDER — CYCLOBENZAPRINE HCL 5 MG PO TABS: 5.0000 mg | ORAL_TABLET | Freq: Every day | ORAL | Status: DC

## 2014-05-25 NOTE — Patient Instructions (Addendum)
Please visit the lab before leaving today.   We will contact you with your lab results.   Follow up at next appointment 06/13/14 with Dr. Derrel Nip.  Light activity is okay at this time. If it hurts...don't do it (rule of thumb).

## 2014-05-25 NOTE — Progress Notes (Signed)
Pre visit review using our clinic review tool, if applicable. No additional management support is needed unless otherwise documented below in the visit note. 

## 2014-05-25 NOTE — Progress Notes (Signed)
Subjective:    Patient ID: [name], female    DOB: Aug 11, 1945, 69 y.o.   MRN: [id number]  HPI  Ms. Barraco is a 69 yo female with a CC of groin and arm pain x 6 weeks.   1) Arms started first started in left arm. Achy pain in opposite hip when sleeping on side. Arms feel achy and heavy, Bilateral groin pain, hurts worse with bending down.  Goes to gym unable to use arm machines  Goes to the gym 3-5 x a week. Participates in Silver sneakers twice a week, Ellipitcal for 30 min. And weight machines afterward on days not doing silver sneakers. Groin pain x 3 weeks consistently and worsening.  Bra is problematic, can not reach back far with both arms but left is worse.  Treatment to date-  Heat Motrin- Not helpful   Blood sugars have been in the normal range.   Review of Systems  Constitutional: Negative for fever, chills, diaphoresis, fatigue and unexpected weight change.  Gastrointestinal: Negative for nausea, vomiting, diarrhea and constipation.  Musculoskeletal: Positive for myalgias and arthralgias. Negative for back pain and gait problem.  Skin: Negative for color change and rash.  Neurological: Negative for dizziness, tremors, syncope, weakness, light-headedness, numbness and headaches.  Hematological: Does not bruise/bleed easily.    Past Medical History  Diagnosis Date  . Hyperlipidemia     Lipitor trial was a failure  . Hypertension   . Diabetes mellitus   . High cholesterol   . Gastritis     History   Social History  . Marital Status: Married    Spouse Name: N/A    Number of Children: N/A  . 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 Smoking status: Never Smoker   . Smokeless tobacco: Never Used  . Alcohol Use: No  . Drug Use: No  . Sexual Activity:    Partners: Male   Other Topics Concern  . Not on file   Social History Narrative    Past Surgical History  Procedure Laterality Date    . Spine surgery  1995  . Tubal ligation    . Spine surgery      bone graft   . Cardiac catheterization  Augus 2007    normal coronaries,  EF 60%    Family History  Problem Relation Age of Onset  . Diabetes Mother   . Heart disease Father   . Stroke Father   . Diabetes Father   . Diabetes Maternal Grandfather   . Diabetes Brother     Allergies  Allergen Reactions  . Amoxicillin Hives  . Latex Rash    Current Outpatient Prescriptions on File Prior to Visit  Medication Sig Dispense Refill  . aspirin 81 MG tablet Take 1 tablet (81 mg total) by mouth daily. 30 tablet 2  . atorvastatin (LIPITOR) 80 MG tablet Take 1 tablet (80 mg total) by mouth daily. 90 tablet 2  . Blood Glucose Monitoring Suppl (ONETOUCH VERIO IQ SYSTEM) W/DEVICE KIT 1 kit by Does not apply route once. 1 kit 0  . Famotidine-Ca Carb-Mag Hydrox (PEPCID COMPLETE PO) Take by mouth as needed    . fish oil-omega-3 fatty acids 1000 MG capsule Take 2 g by mouth 2 (two) times daily.      Marland Kitchen glipiZIDE (GLUCOTROL) 5 MG tablet TAKE 1/2 TABLET EVERY MORNING AND 1 WHOLE TABLET EACH EVENING  BEFORE MEALS 135 tablet 1  . glucose blood (FREESTYLE LITE) test strip Use as instructed 100 each 5  . hyoscyamine (LEVSIN, ANASPAZ) 0.125 MG tablet Take 1 tablet (0.125 mg total) by mouth every 4 (four) hours as needed. 30 tablet 1  . losartan (COZAAR) 50 MG tablet Take 1 tablet (50 mg total) by mouth daily. 90 tablet 1  . metFORMIN (GLUCOPHAGE) 500 MG tablet Take 1 tablet (500 mg total) by mouth 2 (two) times daily with a meal. 180 tablet 3  . Multiple Vitamin (MULTIVITAMIN) tablet Take 1 tablet by mouth daily.    . pantoprazole (PROTONIX) 40 MG tablet Take 1 tablet (40 mg total) by mouth daily. 90 tablet 1  . vitamin C (ASCORBIC ACID) 500 MG tablet Take 500 mg by mouth daily.    . vitamin E 100 UNIT capsule Take 100 Units by mouth daily.    . [DISCONTINUED] ferrous sulfate 325 (65 FE) MG tablet Take 325 mg by mouth daily with breakfast.       . [DISCONTINUED] omeprazole (PRILOSEC) 40 MG capsule Take 1 capsule (40 mg total) by mouth daily. 30 capsule 1   No current facility-administered medications on file prior to visit.      Objective:   Physical Exam  Constitutional: She is oriented to person, place, and time. She appears well-developed and well-nourished. No distress.  HENT:  Head: Normocephalic and atraumatic.  Cardiovascular: Normal rate and regular rhythm.   Pulmonary/Chest: Effort normal and breath sounds normal.  Musculoskeletal: She exhibits no edema or tenderness.  Decreased ROM in arms, Tender in groin bilaterally and biceps bilaterally.   Neurological: She is alert and oriented to person, place, and time. She displays normal reflexes. She exhibits normal muscle tone. Coordination normal.  Skin: Skin is warm and dry. No rash noted. She is not diaphoretic.  Psychiatric: She has a normal mood and affect. Her behavior is normal. Judgment and thought content normal.   BP 118/64 mmHg  Pulse 83  Temp(Src) 97.8 F (36.6 C) (Oral)  Resp 12  Ht 5' 5"  (1.651 m)  Wt 191 lb 12.8 oz (87 kg)  BMI 31.92 kg/m2  SpO2 97%     Assessment & Plan:

## 2014-05-28 ENCOUNTER — Telehealth: Payer: Self-pay | Admitting: *Deleted

## 2014-05-28 DIAGNOSIS — M069 Rheumatoid arthritis, unspecified: Secondary | ICD-10-CM | POA: Insufficient documentation

## 2014-05-28 NOTE — Telephone Encounter (Signed)
Fax from [facility], needing PA for Flexeril. Lists preferred alternatives: Celebrex, Tizanidine, or Lyrica. Please advise if can be changed to one of these?

## 2014-05-28 NOTE — Assessment & Plan Note (Signed)
Obtain ESR and CK. Worsening. Has appointment at end of month with Dr. Derrel Nip. Encouraged her to keep appointment.

## 2014-05-29 MED ORDER — TIZANIDINE HCL 2 MG PO TABS
2.0000 mg | ORAL_TABLET | Freq: Every day | ORAL | Status: DC
Start: 2014-05-29 — End: 2014-07-03

## 2014-05-29 NOTE — Telephone Encounter (Signed)
Rx sent to pharmacy by escript  

## 2014-05-29 NOTE — Telephone Encounter (Signed)
I sent earlier message to Denisa saying we could continue with PA. However, Tizanidine will work just fine. Tizanidine 2 mg tablet PO once at bed time. #30 no refills. Thanks!

## 2014-06-06 DIAGNOSIS — M79603 Pain in arm, unspecified: Secondary | ICD-10-CM | POA: Insufficient documentation

## 2014-06-13 ENCOUNTER — Encounter: Payer: Self-pay | Admitting: Internal Medicine

## 2014-06-13 ENCOUNTER — Ambulatory Visit (INDEPENDENT_AMBULATORY_CARE_PROVIDER_SITE_OTHER): Payer: Medicare Other | Admitting: Internal Medicine

## 2014-06-13 VITALS — BP 126/84 | HR 82 | Temp 97.2°F | Resp 14 | Ht 65.0 in | Wt 189.2 lb

## 2014-06-13 DIAGNOSIS — M791 Myalgia, unspecified site: Secondary | ICD-10-CM

## 2014-06-13 DIAGNOSIS — D509 Iron deficiency anemia, unspecified: Secondary | ICD-10-CM

## 2014-06-13 DIAGNOSIS — M353 Polymyalgia rheumatica: Secondary | ICD-10-CM

## 2014-06-13 MED ORDER — HYDROCODONE-ACETAMINOPHEN 5-325 MG PO TABS: 1.0000 | ORAL_TABLET | Freq: Four times a day (QID) | ORAL | Status: DC | PRN

## 2014-06-13 NOTE — Progress Notes (Signed)
Pre visit review using our clinic review tool, if applicable. No additional management support is needed unless otherwise documented below in the visit note. 

## 2014-06-13 NOTE — Progress Notes (Signed)
Patient ID: [name], female   DOB: 10/31/1945, 69 y.o.   MRN: [id number]  Patient Active Problem List   Diagnosis Date Noted  . Myalgia 05/28/2014  . Special screening for malignant neoplasms, colon 01/11/2014  . Normal pap smear, positive high risk HPV in 02/28/14 and 03/08/14 02/28/2013  . Gastritis   . Fatty liver disease, nonalcoholic 40/12/6759  . Chest pain at rest 07/07/2011  . Iron deficiency anemia 02/11/2011  . Diabetes mellitus type 2 in obese 02/11/2011  . Hypertension 02/11/2011  . Hyperlipidemia     Subjective:  CC:   Chief Complaint  Patient presents with  . Follow-up    follow up myalgia, saw Morey Hummingbird 05/25/14. Pain is unchanged, pain in legs and arms    HPI:   [name] is a 69 y.o. female who presents for Follow up on severe diffuse muscle pain and to a lesser extent shoulder and groin pain, of several weeks duration,  Symptoms started in her left upper biceps area several weeks ago,  before Christmas,  Mild at the beginning,  But has become more severe and  spread to involve the right upper arm , then to both sides of groin.  Marland Kitchenmuscles are tender to squeeze a little  But shoulders feel fine,  Less appetite, no fevers, or unintentional weight loss, no  nausea . No recent travel,  Has been taking Lipitor for years.  Was seen by PA at [facility] at x rays of shoulders and hips were normal,  rheumatology referral offered but deferred.  Today she is miserable,  Having trouble sleeping ,  Accompanied by husband,  She is taking Lipitor which has not been stopped   Lab Results  Component Value Date   ESRSEDRATE 43* 05/25/2014   No results found for: CRP    Past Medical History  Diagnosis Date  . Hyperlipidemia     Lipitor trial was a failure  . Hypertension   . Diabetes mellitus   . High cholesterol   . Gastritis     Past Surgical History  Procedure Laterality Date  . Spine surgery  1995  . Tubal ligation    . Spine surgery      bone graft    . Cardiac catheterization  Augus 2007    normal coronaries,  EF 60%       The following portions of the patient's history were reviewed and updated as appropriate: Allergies, current medications, and problem list.    Review of Systems:   Patient denies headache, fevers, malaise, unintentional weight loss, skin rash, eye pain, sinus congestion and sinus pain, sore throat, dysphagia,  hemoptysis , cough, dyspnea, wheezing, chest pain, palpitations, orthopnea, edema, abdominal pain, nausea, melena, diarrhea, constipation, flank pain, dysuria, hematuria, urinary  Frequency, nocturia, numbness, tingling, seizures,  Focal weakness, Loss of consciousness,  Tremor, insomnia, depression, anxiety, and suicidal ideation.     History   Social History  . Marital Status: Married    Spouse Name: N/A    Number of Children: N/A  . 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 Smoking status: Never Smoker   . Smokeless tobacco: Never Used  . Alcohol Use: No  . Drug Use: No  . Sexual Activity:    Partners: Male   Other Topics Concern  . Not on file   Social History Narrative    Objective:  Filed  Vitals:   06/13/14 1549  BP: 126/84  Pulse: 82  Temp: 97.2 F (36.2 C)  Resp: 14     General appearance: alert, cooperative and appears stated age Ears: normal TM's and external ear canals both ears Throat: lips, mucosa, and tongue normal; teeth and gums normal Neck: no adenopathy, no carotid bruit, supple, symmetrical, trachea midline and thyroid not enlarged, symmetric, no tenderness/mass/nodules Back: symmetric, no curvature. ROM normal. No CVA tenderness. Lungs: clear to auscultation bilaterally Heart: regular rate and rhythm, S1, S2 normal, no murmur, click, rub or gallop Abdomen: soft, non-tender; bowel sounds normal; no masses,  no organomegaly Pulses: 2+ and symmetric Skin: Skin color, texture, turgor normal. No  rashes or lesions MSK: decreased ROM of shoulders and hips,  Tender to palpation of shoulders and biceps,  Inner and outer thighs.  Lymph nodes: Cervical, supraclavicular, and axillary nodes normal.  Assessment and Plan:  Myalgia Severe, etiology now suggestive of PMR.  Inclusion body mystis unlikely given her strength on exam but considering statin toxicity  vs rheumatologic diseases . Referal to [facility] in process.  Advised to STOP THE LIPITOR/.  Lab Results  Component Value Date   ESRSEDRATE 77* 06/13/2014   Lab Results  Component Value Date   CRP 10.3 06/13/2014   Lab Results  Component Value Date   CKTOTAL 64 06/13/2014   Lab Results  Component Value Date   TSH 2.12 05/14/2014   Lab Results  Component Value Date   ANA NEG 06/13/2014    Will start prednisone 15 mg daily     Updated Medication List Outpatient Encounter Prescriptions as of 06/13/2014  Medication Sig  . aspirin 81 MG tablet Take 1 tablet (81 mg total) by mouth daily.  Marland Kitchen atorvastatin (LIPITOR) 80 MG tablet Take 1 tablet (80 mg total) by mouth daily.  . Blood Glucose Monitoring Suppl (ONETOUCH VERIO IQ SYSTEM) W/DEVICE KIT 1 kit by Does not apply route once.  . Famotidine-Ca Carb-Mag Hydrox (PEPCID COMPLETE PO) Take by mouth as needed  . fish oil-omega-3 fatty acids 1000 MG capsule Take 2 g by mouth 2 (two) times daily.    Marland Kitchen glipiZIDE (GLUCOTROL) 5 MG tablet TAKE 1/2 TABLET EVERY MORNING AND 1 WHOLE TABLET EACH EVENING BEFORE MEALS  . glucose blood (FREESTYLE LITE) test strip Use as instructed  . hyoscyamine (LEVSIN, ANASPAZ) 0.125 MG tablet Take 1 tablet (0.125 mg total) by mouth every 4 (four) hours as needed.  Marland Kitchen losartan (COZAAR) 50 MG tablet Take 1 tablet (50 mg total) by mouth daily.  . metFORMIN (GLUCOPHAGE) 500 MG tablet Take 1 tablet (500 mg total) by mouth 2 (two) times daily with a meal.  . Multiple Vitamin (MULTIVITAMIN) tablet Take 1 tablet by mouth daily.  . pantoprazole (PROTONIX) 40  MG tablet Take 1 tablet (40 mg total) by mouth daily.  . vitamin C (ASCORBIC ACID) 500 MG tablet Take 500 mg by mouth daily.  . vitamin E 100 UNIT capsule Take 100 Units by mouth daily.  Marland Kitchen HYDROcodone-acetaminophen (NORCO/VICODIN) 5-325 MG per tablet Take 1 tablet by mouth every 6 (six) hours as needed for moderate pain.  . predniSONE (DELTASONE) 5 MG tablet Take 3 tablets (15 mg total) by mouth daily with breakfast.  . tiZANidine (ZANAFLEX) 2 MG tablet Take 1 tablet (2 mg total) by mouth at bedtime. (Patient not taking: Reported on 06/13/2014)     Orders Placed This Encounter  Procedures  . ANA  . Anti-Smith antibody  . Ferritin  .  Hepatic function panel  . ANCA TITERS  . Sedimentation rate  . CK  . C-reactive protein  . Uric acid  . Alkaline Phosphatase, Isoenzymes  . Vit D  25 hydroxy (rtn osteoporosis monitoring)  . Ambulatory referral to Rheumatology    No Follow-up on file.

## 2014-06-13 NOTE — Patient Instructions (Addendum)
Stop the atorvastatin  Immediately  It may be the cause of your muscle pain   We will repeat your labs  Today and include some that may suggest if a rheumatologi disorder is present.  Referral to Dr Jefm Bryant the rheumatologist  Please try the vicodin for pain control.

## 2014-06-14 LAB — CK: Total CK: 64 U/L (ref 7–177)

## 2014-06-14 LAB — ANA: Anti Nuclear Antibody(ANA): NEGATIVE

## 2014-06-14 LAB — HEPATIC FUNCTION PANEL
ALT: 13 U/L (ref 0–35)
AST: 22 U/L (ref 0–37)
Albumin: 3.6 g/dL (ref 3.5–5.2)
Alkaline Phosphatase: 96 U/L (ref 39–117)
Bilirubin, Direct: 0.2 mg/dL (ref 0.0–0.3)
Total Bilirubin: 0.4 mg/dL (ref 0.2–1.2)
Total Protein: 7.9 g/dL (ref 6.0–8.3)

## 2014-06-14 LAB — ANTI-SMITH ANTIBODY: ENA SM Ab Ser-aCnc: <1

## 2014-06-14 LAB — SEDIMENTATION RATE: Sed Rate: 77 mm/hr — ABNORMAL HIGH (ref 0–22)

## 2014-06-14 LAB — VITAMIN D 25 HYDROXY (VIT D DEFICIENCY, FRACTURES): VITD: 39.03 ng/mL (ref 30.00–100.00)

## 2014-06-14 LAB — ALKALINE PHOSPHATASE, ISOENZYMES
Alkaline Phosphatase: 97 IU/L (ref 39–117)
BONE FRACTION: 27 % (ref 14–68)
INTESTINAL FRAC.: 0 % (ref 0–18)
LIVER FRACTION: 73 % (ref 18–85)

## 2014-06-14 LAB — URIC ACID: Uric Acid, Serum: 2.9 mg/dL (ref 2.4–7.0)

## 2014-06-14 LAB — FERRITIN: Ferritin: 61.6 ng/mL (ref 10.0–291.0)

## 2014-06-15 LAB — C-REACTIVE PROTEIN: CRP: 10.3 mg/dL (ref 0.5–20.0)

## 2014-06-16 ENCOUNTER — Other Ambulatory Visit: Payer: Self-pay | Admitting: Internal Medicine

## 2014-06-16 ENCOUNTER — Encounter: Payer: Self-pay | Admitting: Internal Medicine

## 2014-06-16 MED ORDER — PREDNISONE 5 MG PO TABS: 15.0000 mg | ORAL_TABLET | Freq: Every day | ORAL | Status: DC

## 2014-06-16 NOTE — Assessment & Plan Note (Addendum)
Severe, etiology now suggestive of PMR.  Inclusion body mystis unlikely given her strength on exam but considering statin toxicity  vs rheumatologic diseases . Referal to [facility] in process.  Advised to STOP THE LIPITOR/.  Lab Results  Component Value Date   ESRSEDRATE 77* 06/13/2014   Lab Results  Component Value Date   CRP 10.3 06/13/2014   Lab Results  Component Value Date   CKTOTAL 64 06/13/2014   Lab Results  Component Value Date   TSH 2.12 05/14/2014   Lab Results  Component Value Date   ANA NEG 06/13/2014    Will start prednisone 15 mg daily

## 2014-06-18 ENCOUNTER — Telehealth: Payer: Self-pay | Admitting: Internal Medicine

## 2014-06-18 DIAGNOSIS — M353 Polymyalgia rheumatica: Secondary | ICD-10-CM

## 2014-06-18 DIAGNOSIS — E119 Type 2 diabetes mellitus without complications: Secondary | ICD-10-CM

## 2014-06-18 NOTE — Telephone Encounter (Signed)
Left message on Vm to return call.  Pt needs appoint for fasting labs.

## 2014-06-18 NOTE — Telephone Encounter (Signed)
The patient is wanting labs drawn before her 3 month follow up on 5.2.16.

## 2014-06-18 NOTE — Telephone Encounter (Signed)
Fasting labs ordered

## 2014-06-22 ENCOUNTER — Other Ambulatory Visit: Payer: Self-pay | Admitting: *Deleted

## 2014-06-22 MED ORDER — LOSARTAN POTASSIUM 50 MG PO TABS: 50.0000 mg | ORAL_TABLET | Freq: Every day | ORAL | Status: DC

## 2014-07-02 ENCOUNTER — Encounter: Payer: Self-pay | Admitting: Internal Medicine

## 2014-07-02 ENCOUNTER — Ambulatory Visit (INDEPENDENT_AMBULATORY_CARE_PROVIDER_SITE_OTHER): Payer: Medicare Other | Admitting: Internal Medicine

## 2014-07-02 VITALS — BP 142/84 | HR 72 | Temp 97.2°F | Resp 14 | Ht 65.0 in | Wt 184.8 lb

## 2014-07-02 DIAGNOSIS — E1165 Type 2 diabetes mellitus with hyperglycemia: Secondary | ICD-10-CM

## 2014-07-02 DIAGNOSIS — M353 Polymyalgia rheumatica: Secondary | ICD-10-CM

## 2014-07-02 DIAGNOSIS — IMO0002 Reserved for concepts with insufficient information to code with codable children: Secondary | ICD-10-CM

## 2014-07-02 DIAGNOSIS — E785 Hyperlipidemia, unspecified: Secondary | ICD-10-CM

## 2014-07-02 DIAGNOSIS — Z13820 Encounter for screening for osteoporosis: Secondary | ICD-10-CM

## 2014-07-02 MED ORDER — GLIPIZIDE 10 MG PO TABS: ORAL_TABLET | ORAL | Status: DC

## 2014-07-02 NOTE — Progress Notes (Signed)
Patient ID: [name], female   DOB: 07-07-1945, 69 y.o.   MRN: [id number]   Patient Active Problem List   Diagnosis Date Noted  . Screening for osteoporosis 07/03/2014  . Polymyalgia rheumatica 05/28/2014  . Special screening for malignant neoplasms, colon 01/11/2014  . Normal pap smear, positive high risk HPV in 02/28/14 and 03/08/14 02/28/2013  . Gastritis   . Fatty liver disease, nonalcoholic 62/07/5595  . Chest pain at rest 07/07/2011  . Iron deficiency anemia 02/11/2011  . DM (diabetes mellitus), type 2, uncontrolled 02/11/2011  . Hypertension 02/11/2011  . Hyperlipidemia     Subjective:  CC:   Chief Complaint  Patient presents with  . Acute Visit    Patient cbg's higher than normal for patient.    HPI:   [name] is a 69 y.o. female who presents for    Elevated blood sugars .  Patinet was diagnosed with presume PMR by me several weeks ago,, started on prednsone therapy.  Had rheumatology evaluation by Dr. Jefm Bryant for same and prednisone dose was increased and she received an intraarticular steroid injection into shoulder for persistent pain.  Since starting prednisone her fasting blood sugars have been consistently > 130 but less than 160.  Evening post prandials have been significantly higher,  Up to 300 at times and usually > 200.  Her last Aqc was elevated in December and she has been following a low glycemic index diet since then , losing weight and taking her medications as directed.  Lab Results  Component Value Date   HGBA1C 7.7* 05/14/2014   Denies polydipsia,  Polyuria and joint pain has improved significantly.  Concerned about use of prednisone long term.  Has not had a DEXA scan on over 5 years, if ever.   Follow up on severe diffuse muscle pain and to a lesser extent shoulder and groin pain, of several weeks duration,  Symptoms started in her left upper biceps area several weeks ago,  before Christmas,  Mild at the beginning,  But has become more  severe and  spread to involve the right upper arm , then to both sides of groin.  Marland Kitchenmuscles are tender to squeeze a little  But shoulders feel fine,  Less appetite, no fevers, or unintentional weight loss, no  nausea . No recent travel,  Has been taking Lipitor for years.  Was seen by PA at [facility] at x rays of shoulders and hips were normal,  rheumatology referral offered but deferred.  Today she is miserable,  Having trouble sleeping ,  Accompanied by husband,  She is taking Lipitor which has not been stopped   Lab Results  Component Value Date   ESRSEDRATE 43* 05/25/2014   No results found for: CRP    Past Medical History  Diagnosis Date  . Hyperlipidemia     Lipitor trial was a failure  . Hypertension   . Diabetes mellitus   . High cholesterol   . Gastritis     Past Surgical History  Procedure Laterality Date  . Spine surgery  1995  . Tubal ligation    . Spine surgery      bone graft   . Cardiac catheterization  Augus 2007    normal coronaries,  EF 60%       The following portions of the patient's history were reviewed and updated as appropriate: Allergies, current medications, and problem list.    Review of Systems:   Patient denies headache, fevers, malaise,  unintentional weight loss, skin rash, eye pain, sinus congestion and sinus pain, sore throat, dysphagia,  hemoptysis , cough, dyspnea, wheezing, chest pain, palpitations, orthopnea, edema, abdominal pain, nausea, melena, diarrhea, constipation, flank pain, dysuria, hematuria, urinary  Frequency, nocturia, numbness, tingling, seizures,  Focal weakness, Loss of consciousness,  Tremor, insomnia, depression, anxiety, and suicidal ideation.     History   Social History  . Marital Status: Married    Spouse Name: N/A  . Number of Children: N/A  . 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 Smoking status: Never Smoker   . Smokeless tobacco:  Never Used  . Alcohol Use: No  . Drug Use: No  . Sexual Activity:    Partners: Male   Other Topics Concern  . Not on file   Social History Narrative    Objective:  Filed Vitals:   07/02/14 1053  BP: 142/84  Pulse: 72  Temp: 97.2 F (36.2 C)  Resp: 14     General appearance: alert, cooperative and appears stated age Ears: normal TM's and external ear canals both ears Throat: lips, mucosa, and tongue normal; teeth and gums normal Neck: no adenopathy, no carotid bruit, supple, symmetrical, trachea midline and thyroid not enlarged, symmetric, no tenderness/mass/nodules Back: symmetric, no curvature. ROM normal. No CVA tenderness. Lungs: clear to auscultation bilaterally Heart: regular rate and rhythm, S1, S2 normal, no murmur, click, rub or gallop Abdomen: soft, non-tender; bowel sounds normal; no masses,  no organomegaly Pulses: 2+ and symmetric Skin: Skin color, texture, turgor normal. No rashes or lesions Lymph nodes: Cervical, supraclavicular, and axillary nodes normal.  Assessment and Plan:  Hyperlipidemia Lipitor was suspended due to arthralgias,  But will be resumed at next diabetes follow up if indicated by repeat labs.    Screening for osteoporosis She is overdue for screening.  Now that she has started therapy for PMR with daily prednisone, a baseline DEXA is needed . Discussed the current controversies surrounding the risks and benefits of calcium supplementation.  Encouraged her to increase dietary calcium through natural foods including almond/coconut milk   Polymyalgia rheumatica Currently takign 15 mg bid per confirmatio n of diagnosis by Rheumatology Jefm Bryant). Discussed long term management will include lowering prednisone dose  Once symtpoms are under control   DM (diabetes mellitus), type 2, uncontrolled Increased glipizide dose to 5 mg q am and 10 mg apm ,  Will add lantus in 1-2 weeks if not at goal. 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     Updated Medication List Outpatient Encounter Prescriptions as of 07/02/2014  Medication Sig  . aspirin 81 MG tablet Take 1 tablet (81 mg total) by mouth daily.  Marland Kitchen atorvastatin (LIPITOR) 80 MG tablet Take 1 tablet (80 mg total) by mouth daily.  . Blood Glucose Monitoring Suppl (ONETOUCH VERIO IQ SYSTEM) W/DEVICE KIT 1 kit by Does not apply route once.  . calcium-vitamin D (OSCAL WITH D) 250-125 MG-UNIT per tablet Take 2 tablets by mouth daily.  . fish oil-omega-3 fatty acids 1000 MG capsule Take 2 g by mouth 2 (two) times daily.    Marland Kitchen glipiZIDE (GLUCOTROL) 10 MG tablet 5 mg in Am and 10 mg in PM before meals  . glucose blood (FREESTYLE LITE) test strip Use as instructed  . hyoscyamine (LEVSIN, ANASPAZ) 0.125 MG tablet Take 1 tablet (0.125 mg total) by mouth every 4 (four)  hours as needed.  Marland Kitchen losartan (COZAAR) 50 MG tablet Take 1 tablet (50 mg total) by mouth daily.  . metFORMIN (GLUCOPHAGE) 500 MG tablet Take 1 tablet (500 mg total) by mouth 2 (two) times daily with a meal.  . Multiple Vitamin (MULTIVITAMIN) tablet Take 1 tablet by mouth daily.  . pantoprazole (PROTONIX) 40 MG tablet Take 1 tablet (40 mg total) by mouth daily.  . predniSONE (DELTASONE) 5 MG tablet Take 3 tablets (15 mg total) by mouth daily with breakfast.  . vitamin C (ASCORBIC ACID) 500 MG tablet Take 500 mg by mouth daily.  . vitamin E 100 UNIT capsule Take 100 Units by mouth daily.  . [DISCONTINUED] glipiZIDE (GLUCOTROL) 5 MG tablet TAKE 1/2 TABLET EVERY MORNING AND 1 WHOLE TABLET EACH EVENING BEFORE MEALS  . Famotidine-Ca Carb-Mag Hydrox (PEPCID COMPLETE PO) Take by mouth as needed  . HYDROcodone-acetaminophen (NORCO/VICODIN) 5-325 MG per tablet Take 1 tablet by mouth every 6 (six) hours as needed for moderate pain. (Patient not taking: Reported on 07/02/2014)  . tiZANidine (ZANAFLEX) 2 MG tablet Take 1 tablet (2 mg total) by mouth at bedtime. (Patient not taking: Reported on 06/13/2014)      Orders Placed This Encounter  Procedures  . DG Bone Density    No Follow-up on file.

## 2014-07-02 NOTE — Progress Notes (Signed)
Pre-visit discussion using our clinic review tool. No additional management support is needed unless otherwise documented below in the visit note.  

## 2014-07-02 NOTE — Patient Instructions (Addendum)
The Prednisone is raising your blood sugars  I recommend getting the majority of your calcium and Vitamin D  through diet rather than supplements given the recent association of calcium supplements with increased coronary artery calcium scores (You need 1200 mg daily )   Unsweetened almond with or without /coconut milk is a great low calorie low carb, cholesterol free  way to increase your dietary calcium and vitamin D.  Try the blue Diamond  brand   Increase glipizide to 5 mg with breakfast and 10 mg with dinner  If your sugars are stll > 160 after one week,  We will start 10 units of  Lantus/Levemir once daily   Check eithe rpost lunch or post dinner whichever is highest   YOU CAN RESUME THE LIPITOR FOR CHOLESTEROL AFTER WE GET YOUR NEXT SET OF fasting  LABS IN EARLY April

## 2014-07-03 ENCOUNTER — Encounter: Payer: Self-pay | Admitting: Internal Medicine

## 2014-07-03 DIAGNOSIS — Z13820 Encounter for screening for osteoporosis: Secondary | ICD-10-CM | POA: Insufficient documentation

## 2014-07-03 NOTE — Assessment & Plan Note (Signed)
Currently takign 15 mg bid per confirmatio n of diagnosis by Rheumatology [name]). Discussed long term management will include lowering prednisone dose  Once symtpoms are under control

## 2014-07-03 NOTE — Assessment & Plan Note (Signed)
Increased glipizide dose to 5 mg q am and 10 mg apm ,  Will add lantus in 1-2 weeks if not at goal. 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

## 2014-07-03 NOTE — Assessment & Plan Note (Signed)
She is overdue for screening.  Now that she has started therapy for PMR with daily prednisone, a baseline DEXA is needed . Discussed the current controversies surrounding the risks and benefits of calcium supplementation.  Encouraged her to increase dietary calcium through natural foods including almond/coconut milk

## 2014-07-03 NOTE — Assessment & Plan Note (Signed)
Lipitor was suspended due to arthralgias,  But will be resumed at next diabetes follow up if indicated by repeat labs.

## 2014-07-09 ENCOUNTER — Other Ambulatory Visit: Payer: Self-pay | Admitting: Internal Medicine

## 2014-07-11 ENCOUNTER — Telehealth: Payer: Self-pay | Admitting: Internal Medicine

## 2014-07-11 NOTE — Telephone Encounter (Signed)
Received a form request for lumbar brace.  I have no history of spinal stenosis or low back pain to justify the brace.  Did she request this and why?

## 2014-07-11 NOTE — Telephone Encounter (Signed)
We can discard patient was ask but they made sound as if recommended by MD.

## 2014-07-16 ENCOUNTER — Telehealth: Payer: Self-pay | Admitting: Internal Medicine

## 2014-07-16 MED ORDER — INSULIN ASPART 100 UNIT/ML ~~LOC~~ SOLN: SUBCUTANEOUS | Status: DC

## 2014-07-16 MED ORDER — INSULIN SYRINGES (DISPOSABLE) U-100 1 ML MISC: Status: DC

## 2014-07-16 NOTE — Telephone Encounter (Signed)
Patient notified and voiced understanding with read back

## 2014-07-16 NOTE — Telephone Encounter (Signed)
I received her letter re blood sugars and note that her morning sugars are getting better but her afternoon  evening post prandials are still < 250 a lot. I would recommend giving herself 5 units of short acting insulin before lunch and dinner until her postprandials start to stay < 150.   will send rx to pharmacy.

## 2014-07-31 ENCOUNTER — Telehealth: Payer: Self-pay | Admitting: Internal Medicine

## 2014-07-31 NOTE — Telephone Encounter (Signed)
Patient notified and voiced understanding of dosage with dosage call back to nurse. Patient also wanted to advise MD her prednisone was reduced on 07/30/14 to 20 mg daily each morning from 15 mg BID. FYI

## 2014-07-31 NOTE — Telephone Encounter (Signed)
I have reviewed her blood sugars.  Please ask patient to increase the novolog to 10 units before dinner meal and continue 5 units before lunch, and add 3 units before breakfast.

## 2014-07-31 NOTE — Telephone Encounter (Signed)
Left message for patient to return call to office. 

## 2014-08-02 LAB — FECAL OCCULT BLOOD, GUAIAC: Fecal Occult Blood: NEGATIVE

## 2014-08-08 ENCOUNTER — Ambulatory Visit: Payer: Medicare Other | Admitting: Nurse Practitioner

## 2014-08-09 ENCOUNTER — Ambulatory Visit (INDEPENDENT_AMBULATORY_CARE_PROVIDER_SITE_OTHER): Payer: Medicare Other | Admitting: Nurse Practitioner

## 2014-08-09 ENCOUNTER — Encounter: Payer: Self-pay | Admitting: Nurse Practitioner

## 2014-08-09 VITALS — BP 122/78 | HR 89 | Temp 97.7°F | Resp 14 | Ht 65.0 in | Wt 184.8 lb

## 2014-08-09 DIAGNOSIS — B9789 Other viral agents as the cause of diseases classified elsewhere: Principal | ICD-10-CM

## 2014-08-09 DIAGNOSIS — J069 Acute upper respiratory infection, unspecified: Secondary | ICD-10-CM

## 2014-08-09 MED ORDER — AZITHROMYCIN 250 MG PO TABS: ORAL_TABLET | ORAL | Status: DC

## 2014-08-09 NOTE — Progress Notes (Signed)
Pre visit review using our clinic review tool, if applicable. No additional management support is needed unless otherwise documented below in the visit note. 

## 2014-08-09 NOTE — Progress Notes (Signed)
   Subjective:    Patient ID: [name], female    DOB: [birth year], 69 y.o.   MRN: [id number]  HPI  [name] is a 69 yo female with a CC of cough with green mucous x 5 days.   1) Rhinorrhea- clear, and cough with green, able to blow out a lot   Generic cold/flu- take those at bed time 2-3 x during the day not helpful Net-Pot in morning  Diabetic tussin- somewhat helpful   Review of Systems  Constitutional: Positive for fatigue. Negative for fever, chills and diaphoresis.  HENT: Positive for congestion, postnasal drip and sore throat. Negative for ear discharge, ear pain, sinus pressure and sneezing.   Eyes: Negative for visual disturbance.  Respiratory: Positive for chest tightness. Negative for shortness of breath and wheezing.   Cardiovascular: Negative for chest pain, palpitations and leg swelling.  Gastrointestinal: Negative for nausea, vomiting and diarrhea.  Skin: Negative for rash.  Neurological: Negative for dizziness, weakness, numbness and headaches.  Psychiatric/Behavioral: The patient is not nervous/anxious.       Objective:   Physical Exam  Constitutional: She is oriented to person, place, and time. She appears well-developed and well-nourished. No distress.  BP 122/78 mmHg  Pulse 89  Temp(Src) 97.7 F (36.5 C) (Oral)  Resp 14  Ht 5\' 5"  (1.651 m)  Wt 184 lb 12 oz (83.802 kg)  BMI 30.74 kg/m2  SpO2 96%   HENT:  Head: Normocephalic and atraumatic.  Right Ear: External ear normal.  Left Ear: External ear normal.  Eyes: Right eye exhibits no discharge. Left eye exhibits no discharge. No scleral icterus.  Cardiovascular: Normal rate, regular rhythm and normal heart sounds.  Exam reveals no gallop and no friction rub.   No murmur heard. Pulmonary/Chest: Effort normal and breath sounds normal. No respiratory distress. She has no wheezes. She has no rales. She exhibits no tenderness.  Neurological: She is alert and oriented to person, place, and time. No  cranial nerve deficit. She exhibits normal muscle tone. Coordination normal.  Skin: Skin is warm and dry. No rash noted. She is not diaphoretic.  Psychiatric: She has a normal mood and affect. Her behavior is normal. Judgment and thought content normal.      Assessment & Plan:  Viral URI with a Cough   1) Pt on prednisone daily and having approx. 5 days of symptoms.  2) Going out of town tomorrow and failed conservative therapy. Will try Z-pack x 5 days.  3) Encouraged probiotic use.

## 2014-08-09 NOTE — Patient Instructions (Signed)
Call us next week if worsens or fails to improve.   Z-pack as directed. Please take with a probiotic. (Align, Floraque or Culturelle) while you are on the antibiotic to prevent a serious antibiotic associated diarrhea called clostirudium dificile colitis and a vaginal yeast infection.  Continue with your cough syrup as needed.

## 2014-08-14 ENCOUNTER — Encounter: Payer: Self-pay | Admitting: *Deleted

## 2014-08-14 ENCOUNTER — Telehealth: Payer: Self-pay | Admitting: Internal Medicine

## 2014-08-14 ENCOUNTER — Encounter: Payer: Self-pay | Admitting: Nurse Practitioner

## 2014-08-14 ENCOUNTER — Ambulatory Visit (INDEPENDENT_AMBULATORY_CARE_PROVIDER_SITE_OTHER): Payer: Medicare Other | Admitting: Nurse Practitioner

## 2014-08-14 VITALS — BP 138/82 | HR 79 | Temp 97.2°F | Resp 14 | Ht 65.0 in | Wt 184.8 lb

## 2014-08-14 DIAGNOSIS — R062 Wheezing: Secondary | ICD-10-CM

## 2014-08-14 NOTE — Progress Notes (Signed)
   Subjective:    Patient ID: [name], female    DOB: 27-Sep-1945, 69 y.o.   MRN: [id number]  HPI  [name] is a 69 yo female with a CC of wheezing.  [name] was seen by [place] and then by [facility]. She was given Levaquin- 3 days left, 4 days completed Albuterol inhaler- somewhat helpful Wheezing at night   Review of Systems  Constitutional: Negative for fever, chills, diaphoresis and fatigue.  Respiratory: Positive for wheezing. Negative for chest tightness and shortness of breath.   Cardiovascular: Negative for chest pain, palpitations and leg swelling.  Gastrointestinal: Negative for nausea, vomiting and diarrhea.  Skin: Negative for rash.  Neurological: Negative for dizziness, weakness, numbness and headaches.  Psychiatric/Behavioral: The patient is not nervous/anxious.       Objective:   Physical Exam  Constitutional: She is oriented to person, place, and time. She appears well-developed and well-nourished. No distress.  BP 138/82 mmHg  Pulse 79  Temp(Src) 97.2 F (36.2 C) (Oral)  Resp 14  Ht 5\' 5"  (1.651 m)  Wt 184 lb 12.8 oz (83.825 kg)  BMI 30.75 kg/m2  SpO2 96%   HENT:  Head: Normocephalic and atraumatic.  Right Ear: External ear normal.  Left Ear: External ear normal.  Cardiovascular: Normal rate, regular rhythm, normal heart sounds and intact distal pulses.  Exam reveals no gallop and no friction rub.   No murmur heard. Pulmonary/Chest: Effort normal and breath sounds normal. No respiratory distress. She has no wheezes. She has no rales. She exhibits no tenderness.  Neurological: She is alert and oriented to person, place, and time. No cranial nerve deficit. She exhibits normal muscle tone. Coordination normal.  Skin: Skin is warm and dry. No rash noted. She is not diaphoretic.  Psychiatric: She has a normal mood and affect. Her behavior is normal. Judgment and thought content normal.      Assessment & Plan:

## 2014-08-14 NOTE — Telephone Encounter (Signed)
Patient tnoified in office.

## 2014-08-14 NOTE — Telephone Encounter (Signed)
Left message for patient to return call to office. 

## 2014-08-14 NOTE — Patient Instructions (Signed)
Mucinex plain over the counter   Call us Friday if not feeling better (ask for Denisa's voicemail)

## 2014-08-14 NOTE — Telephone Encounter (Signed)
Blood sugars reviewed,  Stop the mealtime insulin  Since patient  Is having recurrent lows.  Resume insulin  Before dinner using just 2 units   only if evening sugars are consistently greater than 140 without the insulin

## 2014-08-14 NOTE — Progress Notes (Signed)
Pre visit review using our clinic review tool, if applicable. No additional management support is needed unless otherwise documented below in the visit note. 

## 2014-08-18 DIAGNOSIS — R062 Wheezing: Secondary | ICD-10-CM | POA: Insufficient documentation

## 2014-08-18 NOTE — Assessment & Plan Note (Signed)
Continue with planned medications. Add mucinex plain daily. Call Friday if not helping.

## 2014-08-29 ENCOUNTER — Telehealth: Payer: Self-pay | Admitting: Internal Medicine

## 2014-08-29 MED ORDER — BLOOD GLUCOSE MONITOR KIT: PACK | Status: AC

## 2014-08-29 NOTE — Telephone Encounter (Signed)
Awaiting signature on script.

## 2014-08-29 NOTE — Telephone Encounter (Signed)
rx printed

## 2014-08-29 NOTE — Telephone Encounter (Signed)
Patient would like a Accu Check Aviva Plus meter and any supplies that goes with it, like test strips.

## 2014-08-30 NOTE — Telephone Encounter (Signed)
Script faxed.

## 2014-09-03 ENCOUNTER — Other Ambulatory Visit: Payer: Self-pay | Admitting: *Deleted

## 2014-09-03 MED ORDER — GLIPIZIDE 10 MG PO TABS: ORAL_TABLET | ORAL | Status: DC

## 2014-09-03 NOTE — Telephone Encounter (Signed)
Fax from pharmacy, requesting 90 day supply 

## 2014-09-12 ENCOUNTER — Ambulatory Visit (INDEPENDENT_AMBULATORY_CARE_PROVIDER_SITE_OTHER): Payer: Medicare Other

## 2014-09-12 VITALS — BP 122/74 | HR 75 | Resp 14 | Ht 65.0 in | Wt 184.0 lb

## 2014-09-12 DIAGNOSIS — Z0000 Encounter for general adult medical examination without abnormal findings: Secondary | ICD-10-CM | POA: Diagnosis not present

## 2014-09-12 NOTE — Progress Notes (Signed)
Patient ID: [name], female   DOB: 1946/05/06, 69 y.o.   MRN: [id number] Wellness visit reviewed.  I agree with health coach provider.

## 2014-09-12 NOTE — Progress Notes (Signed)
Subjective:   [name] is a 69 y.o. female who presents for an Initial Medicare Annual Wellness Visit.  Review of Systems      Cardiac Risk Factors include: advanced age (>13mn, >>60women);diabetes mellitus     Objective:    Today's Vitals   09/12/14 1021  BP: 122/74  Pulse: 75  Resp: 14  Height: 5' 5" (1.651 m)  Weight: 184 lb (83.462 kg)  SpO2: 97%    Current Medications (verified) Outpatient Encounter Prescriptions as of 09/12/2014  Medication Sig  . albuterol (PROVENTIL HFA;VENTOLIN HFA) 108 (90 BASE) MCG/ACT inhaler Inhale 2 puffs into the lungs.  .Marland Kitchenaspirin 81 MG tablet Take 1 tablet (81 mg total) by mouth daily.  . blood glucose meter kit and supplies KIT Dispense based on patient and insurance preference. Use up to four times daily as directed. (FOR ICD-9 250.00, 250.01).  . calcium-vitamin D (OSCAL WITH D) 250-125 MG-UNIT per tablet Take 2 tablets by mouth daily.  . fish oil-omega-3 fatty acids 1000 MG capsule Take 2 g by mouth 2 (two) times daily.    . folic acid (FOLVITE) 1 MG tablet Take 1 tablet by mouth daily.  .Marland KitchenglipiZIDE (GLUCOTROL) 10 MG tablet 5 mg in Am and 10 mg in PM before meals  . glucose blood (FREESTYLE LITE) test strip Use as instructed  . insulin aspart (NOVOLOG) 100 UNIT/ML injection 5 units SQ  before lunch and dinner (Patient taking differently: 3 units before breakfast, 5 units SQ  before lunch and 10 units before dinner)  . Insulin Syringes, Disposable, U-100 1 ML MISC Use to inject insulin Twice daily  . losartan (COZAAR) 50 MG tablet Take 1 tablet (50 mg total) by mouth daily.  . metFORMIN (GLUCOPHAGE) 500 MG tablet Take 1 tablet (500 mg total) by mouth 2 (two) times daily with a meal.  . methotrexate (RHEUMATREX) 2.5 MG tablet Take by mouth. Take 1 by mouth every 7 days. 8 pills once a week with a meal.  . Multiple Vitamin (MULTIVITAMIN) tablet Take 1 tablet by mouth daily.  . predniSONE (DELTASONE) 5 MG tablet Take 20 mg by mouth  daily with breakfast.  . vitamin C (ASCORBIC ACID) 500 MG tablet Take 500 mg by mouth daily.  . vitamin E 100 UNIT capsule Take 100 Units by mouth daily.  . [DISCONTINUED] Blood Glucose Monitoring Suppl (ONETOUCH VERIO IQ SYSTEM) W/DEVICE KIT 1 kit by Does not apply route once.  . [DISCONTINUED] levofloxacin (LEVAQUIN) 500 MG tablet Take 500 mg by mouth daily.  . [DISCONTINUED] pantoprazole (PROTONIX) 40 MG tablet TAKE 1 TABLET (40 MG TOTAL) BY MOUTH DAILY.    Allergies (verified) Amoxicillin and Latex   History: Past Medical History  Diagnosis Date  . Hyperlipidemia     Lipitor trial was a failure  . Hypertension   . Diabetes mellitus   . High cholesterol   . Gastritis    Past Surgical History  Procedure Laterality Date  . Spine surgery  1995  . Tubal ligation    . Spine surgery      bone graft   . Cardiac catheterization  Augus 2007    normal coronaries,  EF 60%   Family History  Problem Relation Age of Onset  . Diabetes Mother   . Heart disease Father   . Stroke Father   . Diabetes Father   . Diabetes Maternal Grandfather   . Diabetes Brother    Social History   Occupational History  .  RN     Retired 2014   Social History Main Topics  . Smoking status: Never Smoker   . Smokeless tobacco: Never Used  . Alcohol Use: No  . Drug Use: No  . Sexual Activity:    Partners: Male    Tobacco Counseling Counseling given: Not Answered   Activities of Daily Living In your present state of health, do you have any difficulty performing the following activities: 09/12/2014  Hearing? N  Vision? N  Difficulty concentrating or making decisions? N  Walking or climbing stairs? N  Dressing or bathing? N  Doing errands, shopping? N  Preparing Food and eating ? N  Using the Toilet? N  In the past six months, have you accidently leaked urine? Y  Do you have problems with loss of bowel control? N  Managing your Medications? N  Managing your Finances? N  Housekeeping or  managing your Housekeeping? N    Immunizations and Health Maintenance Immunization History  Administered Date(s) Administered  . Influenza Split 03/03/2012, 01/26/2013  . Influenza,inj,Quad PF,36+ Mos 02/13/2014  . Pneumococcal Conjugate-13 11/08/2013  . Pneumococcal Polysaccharide-23 03/03/2012  . Tdap 08/08/2013  . Zoster 04/20/2008   There are no preventive care reminders to display for this patient.  Patient Care Team: Crecencio Mc, MD as PCP - General (Internal Medicine) Julieanne Manson Leeanne Mannan., MD (Rheumatology) Osborne Oman, MD as Consulting Physician (Obstetrics and Gynecology)  Indicate any recent Medical Services you may have received from other than [facility] providers in the past year (date may be approximate).     Assessment:   This is a routine wellness examination for [name].   Hearing/Vision screen  Hearing Screening   125Hz 250Hz 500Hz 1000Hz 2000Hz 4000Hz 8000Hz  Right ear:   Pass Pass Pass Pass   Left ear:   Pass Pass Pass Pass     Visual Acuity Screening   Right eye Left eye Both eyes  Without correction: 20/25 20/25 20/20  With correction:       Dietary issues and exercise activities discussed: Current Exercise Habits:: Structured exercise class, Type of exercise: Other - see comments;strength training/weights (silver sneakers, swimming), Time (Minutes): > 60, Frequency (Times/Week): 4, Weekly Exercise (Minutes/Week): 0, Intensity: Moderate  Goals    . Peak Blood Glucose < 180      Depression Screen PHQ 2/9 Scores 09/12/2014 11/08/2013 06/14/2012  PHQ - 2 Score 1 0 0    Fall Risk Fall Risk  09/12/2014 11/08/2013 06/14/2012  Falls in the past year? No No No    Cognitive Function: MMSE - Mini Mental State Exam 09/12/2014  Orientation to time 5  Orientation to Place 5  Registration 3  Attention/ Calculation 5  Recall 3  Language- name 2 objects 2  Language- repeat 1  Language- follow 3 step command 3  Language- read & follow direction 1  Write  a sentence 1  Copy design 1  Total score 30    Screening Tests Health Maintenance  Topic Date Due  . OPHTHALMOLOGY EXAM  09/28/2014  . FOOT EXAM  11/09/2014  . HEMOGLOBIN A1C  11/13/2014  . INFLUENZA VACCINE  12/17/2014  . URINE MICROALBUMIN  02/08/2015  . MAMMOGRAM  03/20/2016  . TETANUS/TDAP  08/09/2023  . COLONOSCOPY  02/03/2024  . DEXA SCAN  Completed  . ZOSTAVAX  Completed  . PNA vac Low Risk Adult  Completed      Plan:     During the course of the visit, Havilah was educated  and counseled about the following appropriate screening and preventive services:   Vaccines to include Pneumoccal, Influenza, Hepatitis B, Td, Zostavax, HCV  Electrocardiogram  Cardiovascular disease screening  Colorectal cancer screening  Bone density screening  Diabetes screening  Glaucoma screening  Mammography/PAP  Nutrition counseling  Smoking cessation counseling  Patient Instructions (the written plan) were given to the patient.    Geni Bers, LPN   1/[id number]/[id number]

## 2014-09-12 NOTE — Patient Instructions (Addendum)
[name] , Thank you for taking time to come for your Medicare Wellness Visit. I appreciate your ongoing commitment to your health goals. Please review the following plan we discussed and let me know if I can assist you in the future.   These are the goals we discussed: Goals    . Peak Blood Glucose < 180       This is a list of the screening recommended for you and due dates:  Health Maintenance  Topic Date Due  . Eye exam for diabetics  09/28/2014  . Complete foot exam   11/09/2014  . Hemoglobin A1C  11/13/2014  . Flu Shot  12/17/2014  . Urine Protein Check  02/08/2015  . Mammogram  03/20/2016  . Tetanus Vaccine  08/09/2023  . Colon Cancer Screening  02/03/2024  . DEXA scan (bone density measurement)  Completed  . Shingles Vaccine  Completed  . Pneumonia vaccines  Completed    Health Maintenance Adopting a healthy lifestyle and getting preventive care can go a long way to promote health and wellness. Talk with your health care provider about what schedule of regular examinations is right for you. This is a good chance for you to check in with your provider about disease prevention and staying healthy. In between checkups, there are plenty of things you can do on your own. Experts have done a lot of research about which lifestyle changes and preventive measures are most likely to keep you healthy. Ask your health care provider for more information. WEIGHT AND DIET  Eat a healthy diet  Be sure to include plenty of vegetables, fruits, low-fat dairy products, and lean protein.  Do not eat a lot of foods high in solid fats, added sugars, or salt.  Get regular exercise. This is one of the most important things you can do for your health.  Most adults should exercise for at least 150 minutes each week. The exercise should increase your heart rate and make you sweat (moderate-intensity exercise).  Most adults should also do strengthening exercises at least twice a week. This is in  addition to the moderate-intensity exercise.  Maintain a healthy weight  Body mass index (BMI) is a measurement that can be used to identify possible weight problems. It estimates body fat based on height and weight. Your health care provider can help determine your BMI and help you achieve or maintain a healthy weight.  For females 27 years of age and older:   A BMI below 18.5 is considered underweight.  A BMI of 18.5 to 24.9 is normal.  A BMI of 25 to 29.9 is considered overweight.  A BMI of 30 and above is considered obese.  Watch levels of cholesterol and blood lipids  You should start having your blood tested for lipids and cholesterol at 68 years of age, then have this test every 5 years.  You may need to have your cholesterol levels checked more often if:  Your lipid or cholesterol levels are high.  You are older than 68 years of age.  You are at high risk for heart disease.  CANCER SCREENING   Lung Cancer  Lung cancer screening is recommended for adults 26-28 years old who are at high risk for lung cancer because of a history of smoking.  A yearly low-dose CT scan of the lungs is recommended for people who:  Currently smoke.  Have quit within the past 15 years.  Have at least a 30-pack-year history of smoking. A  pack year is smoking an average of one pack of cigarettes a day for 1 year.  Yearly screening should continue until it has been 15 years since you quit.  Yearly screening should stop if you develop a health problem that would prevent you from having lung cancer treatment.  Breast Cancer  Practice breast self-awareness. This means understanding how your breasts normally appear and feel.  It also means doing regular breast self-exams. Let your health care provider know about any changes, no matter how small.  If you are in your 20s or 30s, you should have a clinical breast exam (CBE) by a health care provider every 1-3 years as part of a regular  health exam.  If you are 60 or older, have a CBE every year. Also consider having a breast X-ray (mammogram) every year.  If you have a family history of breast cancer, talk to your health care provider about genetic screening.  If you are at high risk for breast cancer, talk to your health care provider about having an MRI and a mammogram every year.  Breast cancer gene (BRCA) assessment is recommended for women who have family members with BRCA-related cancers. BRCA-related cancers include:  Breast.  Ovarian.  Tubal.  Peritoneal cancers.  Results of the assessment will determine the need for genetic counseling and BRCA1 and BRCA2 testing. Cervical Cancer Routine pelvic examinations to screen for cervical cancer are no longer recommended for nonpregnant women who are considered low risk for cancer of the pelvic organs (ovaries, uterus, and vagina) and who do not have symptoms. A pelvic examination may be necessary if you have symptoms including those associated with pelvic infections. Ask your health care provider if a screening pelvic exam is right for you.   The Pap test is the screening test for cervical cancer for women who are considered at risk.  If you had a hysterectomy for a problem that was not cancer or a condition that could lead to cancer, then you no longer need Pap tests.  If you are older than 65 years, and you have had normal Pap tests for the past 10 years, you no longer need to have Pap tests.  If you have had past treatment for cervical cancer or a condition that could lead to cancer, you need Pap tests and screening for cancer for at least 20 years after your treatment.  If you no longer get a Pap test, assess your risk factors if they change (such as having a new sexual partner). This can affect whether you should start being screened again.  Some women have medical problems that increase their chance of getting cervical cancer. If this is the case for you, your  health care provider may recommend more frequent screening and Pap tests.  The human papillomavirus (HPV) test is another test that may be used for cervical cancer screening. The HPV test looks for the virus that can cause cell changes in the cervix. The cells collected during the Pap test can be tested for HPV.  The HPV test can be used to screen women 84 years of age and older. Getting tested for HPV can extend the interval between normal Pap tests from three to five years.  An HPV test also should be used to screen women of any age who have unclear Pap test results.  After 68 years of age, women should have HPV testing as often as Pap tests.  Colorectal Cancer  This type of cancer can be detected  and often prevented.  Routine colorectal cancer screening usually begins at 68 years of age and continues through 68 years of age.  Your health care provider may recommend screening at an earlier age if you have risk factors for colon cancer.  Your health care provider may also recommend using home test kits to check for hidden blood in the stool.  A small camera at the end of a tube can be used to examine your colon directly (sigmoidoscopy or colonoscopy). This is done to check for the earliest forms of colorectal cancer.  Routine screening usually begins at age 63.  Direct examination of the colon should be repeated every 5-10 years through 68 years of age. However, you may need to be screened more often if early forms of precancerous polyps or small growths are found. Skin Cancer  Check your skin from head to toe regularly.  Tell your health care provider about any new moles or changes in moles, especially if there is a change in a mole's shape or color.  Also tell your health care provider if you have a mole that is larger than the size of a pencil eraser.  Always use sunscreen. Apply sunscreen liberally and repeatedly throughout the day.  Protect yourself by wearing long sleeves,  pants, a wide-brimmed hat, and sunglasses whenever you are outside. HEART DISEASE, DIABETES, AND HIGH BLOOD PRESSURE   Have your blood pressure checked at least every 1-2 years. High blood pressure causes heart disease and increases the risk of stroke.  If you are between 48 years and 67 years old, ask your health care provider if you should take aspirin to prevent strokes.  Have regular diabetes screenings. This involves taking a blood sample to check your fasting blood sugar level.  If you are at a normal weight and have a low risk for diabetes, have this test once every three years after 68 years of age.  If you are overweight and have a high risk for diabetes, consider being tested at a younger age or more often. PREVENTING INFECTION  Hepatitis B  If you have a higher risk for hepatitis B, you should be screened for this virus. You are considered at high risk for hepatitis B if:  You were born in a country where hepatitis B is common. Ask your health care provider which countries are considered high risk.  Your parents were born in a high-risk country, and you have not been immunized against hepatitis B (hepatitis B vaccine).  You have HIV or AIDS.  You use needles to inject street drugs.  You live with someone who has hepatitis B.  You have had sex with someone who has hepatitis B.  You get hemodialysis treatment.  You take certain medicines for conditions, including cancer, organ transplantation, and autoimmune conditions. Hepatitis C  Blood testing is recommended for:  Everyone born from [birth year] through 1964.  Anyone with known risk factors for hepatitis C. Sexually transmitted infections (STIs)  You should be screened for sexually transmitted infections (STIs) including gonorrhea and chlamydia if:  You are sexually active and are younger than 68 years of age.  You are older than 68 years of age and your health care provider tells you that you are at risk for this  type of infection.  Your sexual activity has changed since you were last screened and you are at an increased risk for chlamydia or gonorrhea. Ask your health care provider if you are at risk.  If you do not  have HIV, but are at risk, it may be recommended that you take a prescription medicine daily to prevent HIV infection. This is called pre-exposure prophylaxis (PrEP). You are considered at risk if:  You are sexually active and do not regularly use condoms or know the HIV status of your partner(s).  You take drugs by injection.  You are sexually active with a partner who has HIV. Talk with your health care provider about whether you are at high risk of being infected with HIV. If you choose to begin PrEP, you should first be tested for HIV. You should then be tested every 3 months for as long as you are taking PrEP.  PREGNANCY   If you are premenopausal and you may become pregnant, ask your health care provider about preconception counseling.  If you may become pregnant, take 400 to 800 micrograms (mcg) of folic acid every day.  If you want to prevent pregnancy, talk to your health care provider about birth control (contraception). OSTEOPOROSIS AND MENOPAUSE   Osteoporosis is a disease in which the bones lose minerals and strength with aging. This can result in serious bone fractures. Your risk for osteoporosis can be identified using a bone density scan.  If you are 72 years of age or older, or if you are at risk for osteoporosis and fractures, ask your health care provider if you should be screened.  Ask your health care provider whether you should take a calcium or vitamin D supplement to lower your risk for osteoporosis.  Menopause may have certain physical symptoms and risks.  Hormone replacement therapy may reduce some of these symptoms and risks. Talk to your health care provider about whether hormone replacement therapy is right for you.  HOME CARE INSTRUCTIONS   Schedule  regular health, dental, and eye exams.  Stay current with your immunizations.   Do not use any tobacco products including cigarettes, chewing tobacco, or electronic cigarettes.  If you are pregnant, do not drink alcohol.  If you are breastfeeding, limit how much and how often you drink alcohol.  Limit alcohol intake to no more than 1 drink per day for nonpregnant women. One drink equals 12 ounces of beer, 5 ounces of wine, or 1 ounces of hard liquor.  Do not use street drugs.  Do not share needles.  Ask your health care provider for help if you need support or information about quitting drugs.  Tell your health care provider if you often feel depressed.  Tell your health care provider if you have ever been abused or do not feel safe at home. Document Released: 11/17/2010 Document Revised: 09/18/2013 Document Reviewed: 04/05/2013 [facility] Patient Information 2015 [place], [place]. This information is not intended to replace advice given to you by your health care provider. Make sure you discuss any questions you have with your health care provider.

## 2014-09-13 ENCOUNTER — Other Ambulatory Visit: Payer: Medicare Other

## 2014-09-14 ENCOUNTER — Other Ambulatory Visit: Payer: Medicare Other

## 2014-09-14 ENCOUNTER — Encounter: Payer: Self-pay | Admitting: Internal Medicine

## 2014-09-17 ENCOUNTER — Ambulatory Visit: Payer: Medicare Other | Admitting: Internal Medicine

## 2014-09-17 ENCOUNTER — Other Ambulatory Visit (INDEPENDENT_AMBULATORY_CARE_PROVIDER_SITE_OTHER): Payer: Medicare Other

## 2014-09-17 DIAGNOSIS — E1165 Type 2 diabetes mellitus with hyperglycemia: Secondary | ICD-10-CM

## 2014-09-17 DIAGNOSIS — M353 Polymyalgia rheumatica: Secondary | ICD-10-CM

## 2014-09-17 DIAGNOSIS — IMO0002 Reserved for concepts with insufficient information to code with codable children: Secondary | ICD-10-CM

## 2014-09-17 LAB — COMPREHENSIVE METABOLIC PANEL
ALT: 15 U/L (ref 0–35)
AST: 18 U/L (ref 0–37)
Albumin: 3.4 g/dL — ABNORMAL LOW (ref 3.5–5.2)
Alkaline Phosphatase: 58 U/L (ref 39–117)
BUN: 13 mg/dL (ref 6–23)
CO2: 28 mEq/L (ref 19–32)
Calcium: 9 mg/dL (ref 8.4–10.5)
Chloride: 102 mEq/L (ref 96–112)
Creatinine, Ser: 0.73 mg/dL (ref 0.40–1.20)
GFR: 84.38 mL/min (ref >60.00)
Glucose, Bld: 122 mg/dL — ABNORMAL HIGH (ref 70–99)
Potassium: 4.1 mEq/L (ref 3.5–5.1)
Sodium: 137 mEq/L (ref 135–145)
Total Bilirubin: 0.5 mg/dL (ref 0.2–1.2)
Total Protein: 6.8 g/dL (ref 6.0–8.3)

## 2014-09-17 LAB — CBC WITH DIFFERENTIAL/PLATELET
Basophils Absolute: 0 10*3/uL (ref 0.0–0.1)
Basophils Relative: 0.5 % (ref 0.0–3.0)
Eosinophils Absolute: 0 10*3/uL (ref 0.0–0.7)
Eosinophils Relative: 0.5 % (ref 0.0–5.0)
HCT: 38.3 % (ref 36.0–46.0)
Hemoglobin: 12.7 g/dL (ref 12.0–15.0)
Lymphocytes Relative: 36.8 % (ref 12.0–46.0)
Lymphs Abs: 2.6 10*3/uL (ref 0.7–4.0)
MCHC: 33.1 g/dL (ref 30.0–36.0)
MCV: 85.2 fl (ref 78.0–100.0)
Monocytes Absolute: 0.4 10*3/uL (ref 0.1–1.0)
Monocytes Relative: 5.1 % (ref 3.0–12.0)
Neutro Abs: 4.1 10*3/uL (ref 1.4–7.7)
Neutrophils Relative %: 57.1 % (ref 43.0–77.0)
Platelets: 358 10*3/uL (ref 150.0–400.0)
RBC: 4.5 Mil/uL (ref 3.87–5.11)
RDW: 20.1 % — ABNORMAL HIGH (ref 11.5–15.5)
WBC: 7.2 10*3/uL (ref 4.0–10.5)

## 2014-09-17 LAB — HEMOGLOBIN A1C: Hgb A1c MFr Bld: 8.5 % — ABNORMAL HIGH (ref 4.6–6.5)

## 2014-09-17 LAB — SEDIMENTATION RATE: Sed Rate: 25 mm/hr — ABNORMAL HIGH (ref 0–22)

## 2014-09-17 LAB — LIPID PANEL
Cholesterol: 209 mg/dL — ABNORMAL HIGH (ref 0–200)
HDL: 51.5 mg/dL (ref >39.00)
LDL Cholesterol: 134 mg/dL — ABNORMAL HIGH (ref 0–99)
NonHDL: 157.5
Total CHOL/HDL Ratio: 4
Triglycerides: 116 mg/dL (ref 0.0–149.0)
VLDL: 23.2 mg/dL (ref 0.0–40.0)

## 2014-09-19 ENCOUNTER — Encounter: Payer: Self-pay | Admitting: Internal Medicine

## 2014-09-19 ENCOUNTER — Ambulatory Visit (INDEPENDENT_AMBULATORY_CARE_PROVIDER_SITE_OTHER): Payer: Medicare Other | Admitting: Internal Medicine

## 2014-09-19 VITALS — BP 126/72 | HR 85 | Temp 97.7°F | Resp 14 | Ht 65.0 in | Wt 182.5 lb

## 2014-09-19 DIAGNOSIS — E1165 Type 2 diabetes mellitus with hyperglycemia: Secondary | ICD-10-CM | POA: Diagnosis not present

## 2014-09-19 DIAGNOSIS — K2970 Gastritis, unspecified, without bleeding: Secondary | ICD-10-CM

## 2014-09-19 DIAGNOSIS — M064 Inflammatory polyarthropathy: Secondary | ICD-10-CM

## 2014-09-19 DIAGNOSIS — Z79899 Other long term (current) drug therapy: Secondary | ICD-10-CM | POA: Diagnosis not present

## 2014-09-19 DIAGNOSIS — IMO0002 Reserved for concepts with insufficient information to code with codable children: Secondary | ICD-10-CM

## 2014-09-19 DIAGNOSIS — M1990 Unspecified osteoarthritis, unspecified site: Secondary | ICD-10-CM

## 2014-09-19 MED ORDER — HYDROCODONE-ACETAMINOPHEN 5-325 MG PO TABS: 1.0000 | ORAL_TABLET | Freq: Four times a day (QID) | ORAL | Status: DC | PRN

## 2014-09-19 MED ORDER — FAMOTIDINE 40 MG PO TABS: 40.0000 mg | ORAL_TABLET | Freq: Every day | ORAL | Status: DC

## 2014-09-19 MED ORDER — ATORVASTATIN CALCIUM 40 MG PO TABS: 40.0000 mg | ORAL_TABLET | Freq: Every day | ORAL | Status: DC

## 2014-09-19 NOTE — Progress Notes (Signed)
Pre-visit discussion using our clinic review tool. No additional management support is needed unless otherwise documented below in the visit note.  

## 2014-09-19 NOTE — Patient Instructions (Addendum)
I am prescribing 2 vicodin daily ,  take one in evening and one in morning   famotidine daily for stomach instead of protonix   Continue  checking the fasting sugars and also check pre dinner sugar.  Stop the lunchtime insulin for now.   Change glipizide to 10 mg in am and 5 mg in evening  That will resolve your low morning sugars  And will cover your lunch better,    Drop off your  log in  1 week so I can decide if you need to resume any insulin   continue  exercising daily    Please resume the cholesterol medication at 40 mg daily   Your bone density is normal.   I recommend getting the half of your calcium and Vitamin D  Requirements through dietary sources rather than supplements given the recent association of calcium supplements with increased coronary artery calcium scores (You need 1800 mg daily )   Unsweetened almond/coconut milk, Cashew and soy  Milks are all great low calorie low carb, cholesterol free sources of dietary calcium and vitamin D.

## 2014-09-19 NOTE — Progress Notes (Signed)
Patient ID: [name], female   DOB: 1945/07/12, 69 y.o.   MRN: [id number]   Patient Active Problem List   Diagnosis Date Noted  . Long-term use of high-risk medication 09/22/2014  . Wheezing 08/18/2014  . Screening for osteoporosis 07/03/2014  . Inflammatory arthritis 05/28/2014  . Special screening for malignant neoplasms, colon 01/11/2014  . Normal pap smear, positive high risk HPV in 02/28/14 and 03/08/14 02/28/2013  . Gastritis   . Fatty liver disease, nonalcoholic 46/96/2952  . Chest pain at rest 07/07/2011  . Iron deficiency anemia 02/11/2011  . DM (diabetes mellitus), type 2, uncontrolled 02/11/2011  . Hypertension 02/11/2011  . Hyperlipidemia     Subjective:  CC:   Chief Complaint  Patient presents with  . Follow-up  . Diabetes    HPI:   EVVA DIN is a 69 y.o. female who presents for    Follow up on DM type 2, hypertension, overweight and hyperlipidemia,.  She has been  Suspending statin therapy for the past 3 months due to onset of joint pain which has recetnly beend diagnosed as  s been off of statin for 3 momnths due to new onse joint pain which has recently been diagnosed as inflammatory arthritis by Dr Jefm Bryant.      Lab Results  Component Value Date   HGBA1C 8.5* 09/17/2014     Evening sugars have been elevated.  Using metfornin bid,  5 mg glipizide pre breakfast and 10 mg in evening.  She has  Also been  taking 10 units insulin at lunch but not checking lunchtime sugars .   Right hand really hurting now for the last several week .  wrist splint did not help much.  Has not started morning heat and post exercise ice. Currently on MTX which was started last Tuesday and prednisone.  Since she stopped taking protonix , she has been having some gastritis symptoms.    Results of DEXA scan done at [facility] imaging in November were reviewed today with patient,  Tscores normal .    40 minute visit    Past Medical History  Diagnosis Date  . Hyperlipidemia      Lipitor trial was a failure  . Hypertension   . Diabetes mellitus   . High cholesterol   . Gastritis     Past Surgical History  Procedure Laterality Date  . Spine surgery  1995  . Tubal ligation    . Spine surgery      bone graft   . Cardiac catheterization  Augus 2007    normal coronaries,  EF 60%       The following portions of the patient's history were reviewed and updated as appropriate: Allergies, current medications, and problem list.    Review of Systems:   Patient denies headache, fevers, malaise, unintentional weight loss, skin rash, eye pain, sinus congestion and sinus pain, sore throat, dysphagia,  hemoptysis , cough, dyspnea, wheezing, chest pain, palpitations, orthopnea, edema, abdominal pain, nausea, melena, diarrhea, constipation, flank pain, dysuria, hematuria, urinary  Frequency, nocturia, numbness, tingling, seizures,  Focal weakness, Loss of consciousness,  Tremor, insomnia, depression, anxiety, and suicidal ideation.     History   Social History  . Marital Status: Married    Spouse Name: N/A  . Number of Children: N/A  . 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 Smoking status: Never Smoker   .  Smokeless tobacco: Never Used  . Alcohol Use: No  . Drug Use: No  . Sexual Activity:    Partners: Male   Other Topics Concern  . Not on file   Social History Narrative    Objective:  Filed Vitals:   09/19/14 1331  BP: 126/72  Pulse: 85  Temp: 97.7 F (36.5 C)  Resp: 14     General appearance: alert, cooperative and appears stated age Ears: normal TM's and external ear canals both ears Throat: lips, mucosa, and tongue normal; teeth and gums normal Neck: no adenopathy, no carotid bruit, supple, symmetrical, trachea midline and thyroid not enlarged, symmetric, no tenderness/mass/nodules Back: symmetric, no curvature. ROM normal. No CVA tenderness. Lungs: clear to  auscultation bilaterally Heart: regular rate and rhythm, S1, S2 normal, no murmur, click, rub or gallop Abdomen: soft, non-tender; bowel sounds normal; no masses,  no organomegaly Pulses: 2+ and symmetric Skin: Skin color, texture, turgor normal. No rashes or lesions Lymph nodes: Cervical, supraclavicular, and axillary nodes normal.  Assessment and Plan:  . Problem List Items Addressed This Visit    DM (diabetes mellitus), type 2, uncontrolled - Primary    regimen changed due to recurrent hypoglycemic events in the early morning.  glipizide 10 mg ain am and 5 mg pm,  Suspend lunchtime insulin for the nexgt week while we  Check lunchtime sugars ,  Return in one week.  Resume statin.    Lab Results  Component Value Date   HGBA1C 8.5* 09/17/2014     Lab Results  Component Value Date   CHOL 209* 09/17/2014   HDL 51.50 09/17/2014   LDLCALC 134* 09/17/2014   LDLDIRECT 77.8 06/14/2012   TRIG 116.0 09/17/2014   CHOLHDL 4 09/17/2014         Relevant Medications   atorvastatin (LIPITOR) 40 MG tablet   glipiZIDE (GLUCOTROL) 10 MG tablet   Gastritis    Protonix stopped due to fear of dementia. advsied to try famotidine 20 mg bid.       Inflammatory arthritis    Currently managed with MTX and prednisone taper,  By Precious Reel.  Adding vicodin bid for severe pain.       Relevant Medications   HYDROcodone-acetaminophen (NORCO/VICODIN) 5-325 MG per tablet   Long-term use of high-risk medication    Risks of MTX and prednisone discussed.  Baseline DEXA scan was normal and discussed today.Marland Kitchen  LFTS and CBC will be followed every 6 to 8 weeks  Lab Results  Component Value Date   WBC 7.2 09/17/2014   HGB 12.7 09/17/2014   HCT 38.3 09/17/2014   MCV 85.2 09/17/2014   PLT 358.0 09/17/2014   Lab Results  Component Value Date   ALT 15 09/17/2014   AST 18 09/17/2014   ALKPHOS 58 09/17/2014   BILITOT 0.5 09/17/2014            A total of 40 minutes was spent with patient more  than half of which was spent in counseling patient on the above mentioned issues , reviewing and explaining recent labs and imaging studies done, and coordination of care.   Updated Medication List Outpatient Encounter Prescriptions as of 09/19/2014  Medication Sig  . blood glucose meter kit and supplies KIT Dispense based on patient and insurance preference. Use up to four times daily as directed. (FOR ICD-9 250.00, 250.01).  . calcium-vitamin D (OSCAL WITH D) 250-125 MG-UNIT per tablet Take 2 tablets by mouth daily.  . fish oil-omega-3 fatty acids 1000  MG capsule Take 2 g by mouth 2 (two) times daily.    . folic acid (FOLVITE) 1 MG tablet Take 1 tablet by mouth daily.  Marland Kitchen glipiZIDE (GLUCOTROL) 10 MG tablet 10 mg in Am and 5 mg in PM before meals  . glucose blood (FREESTYLE LITE) test strip Use as instructed  . insulin aspart (NOVOLOG) 100 UNIT/ML injection 5 units SQ  before lunch and dinner (Patient taking differently: 3 units before breakfast, 5 units SQ  before lunch and 10 units before dinner)  . Insulin Syringes, Disposable, U-100 1 ML MISC Use to inject insulin Twice daily  . losartan (COZAAR) 50 MG tablet Take 1 tablet (50 mg total) by mouth daily.  . metFORMIN (GLUCOPHAGE) 500 MG tablet Take 1 tablet (500 mg total) by mouth 2 (two) times daily with a meal.  . methotrexate (RHEUMATREX) 2.5 MG tablet Take by mouth. Take 1 by mouth every 7 days. 8 pills once a week with a meal.  . Multiple Vitamin (MULTIVITAMIN) tablet Take 1 tablet by mouth daily.  . predniSONE (DELTASONE) 5 MG tablet Take 15 mg by mouth daily with breakfast.   . vitamin C (ASCORBIC ACID) 500 MG tablet Take 500 mg by mouth daily.  . vitamin E 100 UNIT capsule Take 100 Units by mouth daily.  . [DISCONTINUED] glipiZIDE (GLUCOTROL) 10 MG tablet 5 mg in Am and 10 mg in PM before meals  . aspirin 81 MG tablet Take 1 tablet (81 mg total) by mouth daily. (Patient not taking: Reported on 09/19/2014)  . atorvastatin (LIPITOR) 40 MG  tablet Take 1 tablet (40 mg total) by mouth daily at 6 PM.  . famotidine (PEPCID) 40 MG tablet Take 1 tablet (40 mg total) by mouth daily.  Marland Kitchen HYDROcodone-acetaminophen (NORCO/VICODIN) 5-325 MG per tablet Take 1 tablet by mouth every 6 (six) hours as needed for moderate pain.  . [DISCONTINUED] albuterol (PROVENTIL HFA;VENTOLIN HFA) 108 (90 BASE) MCG/ACT inhaler Inhale 2 puffs into the lungs.   No facility-administered encounter medications on file as of 09/19/2014.     No orders of the defined types were placed in this encounter.    Return in about 3 months (around 12/20/2014) for follow up diabetes.

## 2014-09-22 DIAGNOSIS — Z79899 Other long term (current) drug therapy: Secondary | ICD-10-CM | POA: Insufficient documentation

## 2014-09-22 MED ORDER — GLIPIZIDE 10 MG PO TABS: ORAL_TABLET | ORAL | Status: DC

## 2014-09-22 NOTE — Assessment & Plan Note (Signed)
Protonix stopped due to fear of dementia. advsied to try famotidine 20 mg bid.

## 2014-09-22 NOTE — Assessment & Plan Note (Signed)
Currently managed with MTX and prednisone taper,  By Precious Reel.  Adding vicodin bid for severe pain.

## 2014-09-22 NOTE — Assessment & Plan Note (Signed)
regimen changed due to recurrent hypoglycemic events in the early morning.  glipizide 10 mg ain am and 5 mg pm,  Suspend lunchtime insulin for the nexgt week while we  Check lunchtime sugars ,  Return in one week.  Resume statin.    Lab Results  Component Value Date   HGBA1C 8.5* 09/17/2014     Lab Results  Component Value Date   CHOL 209* 09/17/2014   HDL 51.50 09/17/2014   LDLCALC 134* 09/17/2014   LDLDIRECT 77.8 06/14/2012   TRIG 116.0 09/17/2014   CHOLHDL 4 09/17/2014

## 2014-09-22 NOTE — Assessment & Plan Note (Signed)
Risks of MTX and prednisone discussed.  Baseline DEXA scan was normal and discussed today.Marland Kitchen  LFTS and CBC will be followed every 6 to 8 weeks  Lab Results  Component Value Date   WBC 7.2 09/17/2014   HGB 12.7 09/17/2014   HCT 38.3 09/17/2014   MCV 85.2 09/17/2014   PLT 358.0 09/17/2014   Lab Results  Component Value Date   ALT 15 09/17/2014   AST 18 09/17/2014   ALKPHOS 58 09/17/2014   BILITOT 0.5 09/17/2014

## 2014-09-23 ENCOUNTER — Other Ambulatory Visit: Payer: Self-pay | Admitting: Internal Medicine

## 2014-09-24 ENCOUNTER — Ambulatory Visit (INDEPENDENT_AMBULATORY_CARE_PROVIDER_SITE_OTHER): Payer: Medicare Other | Admitting: Podiatry

## 2014-09-24 ENCOUNTER — Encounter: Payer: Self-pay | Admitting: Podiatry

## 2014-09-24 VITALS — Ht 66.0 in | Wt 177.0 lb

## 2014-09-24 DIAGNOSIS — B351 Tinea unguium: Secondary | ICD-10-CM | POA: Diagnosis not present

## 2014-09-25 NOTE — Progress Notes (Signed)
She presents today chief complaint of a loose hallux nail left that is thick and painful on ambulation. She states has been like this for quite some time and is starting to bother her. She is here today to decide whether she was to have it removed or not.  Objective: Vital signs are stable she is alert and oriented 3. Stating that the nail never grows it is thick yellow dystrophic and painful with palpation.  Assessment: Nail dystrophy hallux left.  Plan: Debridement of the hallux nail all the way back today. If this grows back painful then we will perform a chemical matrixectomy hallux left.

## 2014-09-26 ENCOUNTER — Telehealth: Payer: Self-pay | Admitting: Internal Medicine

## 2014-09-26 NOTE — Telephone Encounter (Signed)
Pt dropped letter for Dr. Derrel Nip. Letter in Dr. Lupita Dawn box/msn

## 2014-09-26 NOTE — Telephone Encounter (Signed)
In red folder. 

## 2014-09-28 MED ORDER — INSULIN ASPART 100 UNIT/ML ~~LOC~~ SOLN: SUBCUTANEOUS | Status: DC

## 2014-09-28 NOTE — Telephone Encounter (Signed)
Pt concerned with lunch sugar readings, was 225 today. Yesterday, 2 hours after eating it was 221. States it is staying around the 200s. Wanted to know if she should be concerned? Avoiding her usual lunchtime cereal, trying to eat more protein, had an egg with one meal and still elevated afterwards.

## 2014-09-28 NOTE — Telephone Encounter (Signed)
Left message for pt to return my call.

## 2014-09-28 NOTE — Telephone Encounter (Signed)
She can add back 5  units of mealtime insulin at lunch.  Chart updated

## 2014-09-28 NOTE — Telephone Encounter (Signed)
The blood sugars look better with the current regimen  of 10 mg glipizide in am,  5 mg with dinner, and no lunchtime insulin, so continue and resubmit in 2 week s

## 2014-09-28 NOTE — Telephone Encounter (Signed)
Pt notified and verbalized understanding.

## 2014-10-15 ENCOUNTER — Telehealth: Payer: Self-pay | Admitting: Internal Medicine

## 2014-10-15 DIAGNOSIS — E1165 Type 2 diabetes mellitus with hyperglycemia: Secondary | ICD-10-CM

## 2014-10-15 DIAGNOSIS — IMO0002 Reserved for concepts with insufficient information to code with codable children: Secondary | ICD-10-CM

## 2014-10-15 NOTE — Assessment & Plan Note (Signed)
increase novolog to  8 units at lunch Oct 11 2013 .  Plan to change to 70.30  Units qam when refill needed

## 2014-10-15 NOTE — Telephone Encounter (Signed)
Blood sugars reviewed,  Increase pre lunch insulin dose to 8 units..  When she she needs a refill on the novolog, please tell her to call because we will change to 70/30 insulin and have her take it before breakfast, starting  With 8 units and stop the glipizide in the morning

## 2014-10-16 NOTE — Telephone Encounter (Signed)
Patient notified by phone and letter mailed.

## 2014-10-16 NOTE — Telephone Encounter (Signed)
Left message for patient to return call to office. 

## 2014-10-24 ENCOUNTER — Telehealth: Payer: Self-pay | Admitting: Internal Medicine

## 2014-10-24 ENCOUNTER — Other Ambulatory Visit: Payer: Self-pay | Admitting: Internal Medicine

## 2014-10-24 ENCOUNTER — Telehealth: Payer: Self-pay | Admitting: *Deleted

## 2014-10-24 MED ORDER — GLUCOSE BLOOD VI STRP: ORAL_STRIP | Status: DC

## 2014-10-24 MED ORDER — INSULIN ASPART PROT & ASPART (70-30 MIX) 100 UNIT/ML ~~LOC~~ SUSP: 8.0000 [IU] | Freq: Every day | SUBCUTANEOUS | Status: DC

## 2014-10-24 NOTE — Telephone Encounter (Signed)
Pt called states she is wanting to start the Insulin 70/30 now.  She does not want to wait until the Novolog needs refilling.  Please advise refill

## 2014-10-24 NOTE — Telephone Encounter (Signed)
Patient Name: TYARRA NOLTON  DOB: 06-Dec-1945    Initial Comment Caller states, wants to change her insulin to 70/30 , also wants test strips ordered for 3x a day.    Nurse Assessment  Nurse: Mallie Mussel, RN, Alveta Heimlich Date/Time Eilene Ghazi Time): 10/24/2014 9:40:15 AM  Confirm and document reason for call. If symptomatic, describe symptoms. ---Caller states that she wants to get her insulin changed to 70/30. She also wants her test strips ordered for TID, from [facility]. She has not checked her reading this morning. She ate her cereal and forgot to check it before she ate. She states that it is always good in the mornings, its the afternoons where it gets bad. I called the backline and spoke with Melissa. I did a warm transfer to [facility] for further assistance.  Has the patient traveled out of the country within the last 30 days? ---Not Applicable  Does the patient require triage? ---No     Guidelines    Guideline Title Affirmed Question Affirmed Notes       Final Disposition User

## 2014-10-24 NOTE — Telephone Encounter (Signed)
Test strips sent for tid checking

## 2014-10-24 NOTE — Telephone Encounter (Signed)
Patient notified

## 2014-10-24 NOTE — Telephone Encounter (Signed)
Left message for patient to return call to office. 

## 2014-10-24 NOTE — Addendum Note (Signed)
Addended by: Crecencio Mc on: 10/24/2014 02:15 PM   Modules accepted: Orders

## 2014-10-24 NOTE — Telephone Encounter (Signed)
novolog sent to pharmacy start with 8 units in the morning,  Suspend the pre lunch novolog short acting insulin and suspend the morning glipizide dose

## 2014-10-24 NOTE — Telephone Encounter (Signed)
Called pharmacy about her McKenzie. Pharmacist said she was going to check with her insurance to see if they had a lost Rx policy, if not her insurance will pay for her Rx again in a month and she can pay out of pocket for it at $45. If this does not work for her we can try the GoodRx card and see if we can get it for her cheaper.

## 2014-10-24 NOTE — Telephone Encounter (Signed)
Notified patient of new regimen for 70/30 insulin. Patient advised nurse pharmacy would not refill losartan . Need to call pharmacy.

## 2014-10-25 NOTE — Telephone Encounter (Signed)
Pt picked up Losartan on 6.8.16.

## 2014-10-25 NOTE — Telephone Encounter (Signed)
Left message for patient to return call to office. To advise as if Pharmacy refilled her losartan.

## 2014-10-26 ENCOUNTER — Telehealth: Payer: Self-pay | Admitting: Internal Medicine

## 2014-10-26 NOTE — Telephone Encounter (Signed)
Patient wanted to advise MD as CBG's on first day of new insulin CBG before dinner 218 2 hours after dinner CBG = 60 patient CBG before bedtime 88 and fasting today CBG = 77 AM. Patient wanted to make MD aware,Patient stated she has felt fine no symptoms of low CBG. Patient currently taking 8 units with breakfast Novolog 70/30 FYI

## 2014-10-26 NOTE — Telephone Encounter (Signed)
I would have her hold the 1/2 dose of glipizide in the evening.  Remain off glipizide in the am (as she is doing).  Continue to check blood sugars as she is doing.  Eat regular meals and bedtime snack.  If any question about sugar over the weekend - call.  If remains low in evening -  call.  Call in sugar readings Monday am also.  Show to Dr Derrel Nip for fyi.  Let us know if any problems.

## 2014-10-26 NOTE — Telephone Encounter (Signed)
Need to know if she is taking any other insulin.  Also need to make sure she has stopped her morning dose of glipizide.  Need to know all diabetic medications she is taking.  Is she eating regular meals?

## 2014-10-26 NOTE — Telephone Encounter (Signed)
Patient did stop glipizide in the morning, and patient is taking 1/2 tablet in the afternoon, taking metformin BID 500  Mg. Before lunch CBG = 177.

## 2014-10-26 NOTE — Telephone Encounter (Signed)
Left message to call office

## 2014-10-26 NOTE — Telephone Encounter (Signed)
Patient notified and voiced understanding. Read back directions,

## 2014-10-26 NOTE — Telephone Encounter (Signed)
Agree with Dr Nicki Reaper, and thank you Dr Nicki Reaper , have not been able to keep EPIC running for more than 15 minutes tdue to internet problewms.  Very very frustrating,

## 2014-10-30 ENCOUNTER — Telehealth: Payer: Self-pay | Admitting: Internal Medicine

## 2014-10-30 NOTE — Telephone Encounter (Signed)
Pt dropped off letter for Dr. Derrel Nip. Letter in Dr. Lupita Dawn box/msn

## 2014-11-01 NOTE — Telephone Encounter (Signed)
In red folder. 

## 2014-11-05 ENCOUNTER — Ambulatory Visit: Payer: Medicare Other | Attending: Rheumatology | Admitting: Occupational Therapy

## 2014-11-05 DIAGNOSIS — R449 Unspecified symptoms and signs involving general sensations and perceptions: Secondary | ICD-10-CM

## 2014-11-05 DIAGNOSIS — M79641 Pain in right hand: Secondary | ICD-10-CM

## 2014-11-05 DIAGNOSIS — R4189 Other symptoms and signs involving cognitive functions and awareness: Secondary | ICD-10-CM | POA: Diagnosis not present

## 2014-11-05 DIAGNOSIS — M6281 Muscle weakness (generalized): Secondary | ICD-10-CM | POA: Diagnosis present

## 2014-11-06 NOTE — Telephone Encounter (Signed)
Patient notified and voiced understanding.

## 2014-11-06 NOTE — Therapy (Signed)
[facility] [address] [place], [address] Phone: [phone]   Fax:  [phone]  Occupational Therapy Treatment  Patient Details  Name: [name] MRN: [id number] Date of Birth: Jul 22, 1945 Referring Provider:  Emmaline [name].,*  Encounter Date: 11/05/2014      OT End of Session - 11/05/14 1144    Visit Number 1   Number of Visits 4   Date for OT Re-Evaluation 12/04/14   OT Start Time 1345   OT Stop Time 1442   OT Time Calculation (min) 57 min   Activity Tolerance Patient tolerated treatment well   Behavior During Therapy [facility] for tasks assessed/performed      Past Medical History  Diagnosis Date  . Hyperlipidemia     Lipitor trial was a failure  . Hypertension   . Diabetes mellitus   . High cholesterol   . Gastritis     Past Surgical History  Procedure Laterality Date  . Spine surgery  1995  . Tubal ligation    . Spine surgery      bone graft   . Cardiac catheterization  Augus 2007    normal coronaries,  EF 60%    There were no vitals filed for this visit.  Visit Diagnosis:  Sensory deficit, right - Plan: Ot plan of care cert/re-cert  Pain of right hand - Plan: Ot plan of care cert/re-cert  Muscle weakness - Plan: Ot plan of care cert/re-cert      Subjective Assessment - 11/05/14 1131    Subjective  Had last Dec PMR and whole body - but since last 3 months only the R middle finger pain and now numbness too    Patient Stated Goals Want the pain better , pins and needles better  - use my hand normally    Currently in Pain? Yes   Pain Score 3    Pain Location Hand   Pain Orientation Right   Pain Type Chronic pain;Neuropathic pain   Pain Onset More than a month ago   Pain Frequency Constant   Aggravating Factors  using it    Pain Relieving Factors not using it            [facility] OT Assessment - 11/06/14 0001    Assessment   Diagnosis arthritis   Onset Date  08/06/14   Assessment Pt is retired Marine scientist - had since Dec PMR - but that cleared up and then for last 3 months about pain /pins and needles in R 3rd digit - now starting in 2nd tip - on steroids but did not had short - send by MD to assess cause   Prior Function   Leisure Pt retired Marine scientist, married, R hand dominant - likes to travel, Scientist, research (life sciences), play on table, read, work out in gym 6 days week, little cooking     Sensation   Additional Comments Numbness adn pins andneedles in 3rd digits per pt - increase with any use , and lately now in DIP of 2nd    Edema   Edema 3rd PIP 6.5 R , L 6.0    Strength   Right Hand Grip (lbs) 16   Right Hand Lateral Pinch 9 lbs   Right Hand 3 Point Pinch 11 lbs   Left Hand Grip (lbs) 32   Left Hand Lateral Pinch 9 lbs   Left Hand 3 Point Pinch 9 lbs   Right Hand AROM   R Long  PIP 0-100 --  -10   R Long DIP 0-70 55 Degrees      Reviewed with pt HEP for contrast- done in clinic Fitted with isotoner glove for night time - and she needs to sue wrist splint for night time that MD provided while back and she stopped using   Tendon and Med N glides 3 x day  Modifications and adapting how using hand - to decrease CTS   Return when back from trip in [place] in 2 wks                     OT Education - 11/05/14 1143    Education provided Yes   Education Details See pt instruction    Person(s) Educated Patient   Methods Explanation;Demonstration;Verbal cues;Handout   Comprehension Verbalized understanding;Returned demonstration;Verbal cues required          OT Short Term Goals - 11/06/14 1150    OT SHORT TERM GOAL #1   Title Pain and sensory issues to decrease in 3rd digit to 3 episodes during daytime    Baseline Constant pain at the worse 10/10 - adn most of the time with use 8/10    Time 3   Period Weeks   Status New   OT SHORT TERM GOAL #2   Title Pt to be in HEP for splint wearing, tendon glding and Neve gliding to decrease  symptoms    Time 4   Period Weeks   Status New           OT Long Term Goals - 11/05/14 1233    OT LONG TERM GOAL #1   Title Pt to verbalize 3 modifications and positions to avoid  during ADL's and IADL's to decrease symptoms    Time 4   Period Weeks   Status New               Plan - 11/05/14 1145    Clinical Impression Statement Pt present with sensory changes in R 3rd digit - pt positive test for Tinel over CT and phalans test - pt report also sensory changes last week or 2 at DIP of 2nd - increase edema at 3rd PIP and pain with use - pt report  difficulty with wriiting, eating, brushing teeth, making tight fist, wringing washcloth - pt is not tender over putlly and denies any triggering - pt was ed on HEP for CTS , use of splint for wrist at nigth time and modificaions of tasks  - pt out of the country for week starting this [place] - will return in 2 wks for follow up -- show decrease ROM , grip - increase pain , sensory changes     Pt will benefit from skilled therapeutic intervention in order to improve on the following deficits (Retired) Decreased range of motion;Impaired flexibility;Impaired sensation;Increased edema;Pain;Decreased strength;Decreased knowledge of precautions   Rehab Potential Fair   OT Frequency 1x / week   OT Duration 4 weeks   OT Treatment/Interventions Self-care/ADL training;Parrafin;Ultrasound;Splinting;DME and/or AE instruction;Contrast Bath;Therapeutic exercises;Manual Therapy   OT Home Exercise Plan see pt instruction    Consulted and Agree with Plan of Care Patient        Problem List Patient Active Problem List   Diagnosis Date Noted  . Long-term use of high-risk medication 09/22/2014  . Wheezing 08/18/2014  . Screening for osteoporosis 07/03/2014  . Inflammatory arthritis 05/28/2014  . Special screening for malignant neoplasms, colon 01/11/2014  . Normal pap smear, positive  high risk HPV in 02/28/14 and 03/08/14 02/28/2013  . Gastritis    . Fatty liver disease, nonalcoholic 48/27/0786  . Chest pain at rest 07/07/2011  . Iron deficiency anemia 02/11/2011  . DM (diabetes mellitus), type 2, uncontrolled 02/11/2011  . Hypertension 02/11/2011  . Hyperlipidemia     Sydney Dobies OTR/L, CLT 11/06/2014, 12:39 PM  [facility] [address], [place], [address] Phone: [phone]   Fax:  [phone]

## 2014-11-06 NOTE — Patient Instructions (Signed)
HEP for pt to do contrast- donn wrist splint at night time on again - and if can during daytime  Isotoner glove for night time - report some edema in hand and stiffness in am   Med N glide and tendon glides provided   Ed on modifications of tasks and avoid positions that is not good for CT   Pt out of country Wed - will return in 2 wks

## 2014-11-06 NOTE — Telephone Encounter (Signed)
contineu to stay off the glipizide.  Increase the 70/30 morning dose to 12 units and add 5 units before dinner if pre dinner sugar is > 150. Happy travels to [place]!

## 2014-11-15 ENCOUNTER — Other Ambulatory Visit: Payer: Self-pay | Admitting: Internal Medicine

## 2014-11-20 ENCOUNTER — Telehealth: Payer: Self-pay | Admitting: Internal Medicine

## 2014-11-20 NOTE — Telephone Encounter (Signed)
Pt dropped off letter and was placed in Dr. Lupita Dawn box/msn

## 2014-11-21 ENCOUNTER — Telehealth: Payer: Self-pay | Admitting: Internal Medicine

## 2014-11-21 NOTE — Telephone Encounter (Signed)
The new blood sugars suggest that the 5 units of insulin pre dinner for CBGs > 200 is working fine.  She can follow the same advice for lunchtime.

## 2014-11-21 NOTE — Telephone Encounter (Signed)
Spoke to pt, clarified how she is currently taking her insulin because what she told me doesn't match with chart and instructions. Pt has been taking Novolog 70/30 12 units before breakfast and Novolog 70/30 before lunch for CBG > 150. Do you want her then to add this before dinner with instructions below? She states she doesn't take Novolog at all.

## 2014-11-21 NOTE — Telephone Encounter (Signed)
She should NOT  Be taking 70/30 before lunch. If she is taking 70/30 before breakfast  .  That is too close together . She should change regimen to the following  15 units of 70/30 in the morning.  No insulin before lunch.  10 units of 70/30  before dinner.  Add 5 more units before dinner if pre dinner blood sugar is > 200   Check sugars  Fasting ,  Before  lunch,  And before dinner   Send readings in 1-2 weeks

## 2014-11-22 ENCOUNTER — Ambulatory Visit: Payer: Medicare Other | Attending: Rheumatology | Admitting: Occupational Therapy

## 2014-11-22 DIAGNOSIS — R4189 Other symptoms and signs involving cognitive functions and awareness: Secondary | ICD-10-CM | POA: Insufficient documentation

## 2014-11-22 DIAGNOSIS — M6281 Muscle weakness (generalized): Secondary | ICD-10-CM | POA: Diagnosis present

## 2014-11-22 DIAGNOSIS — M79641 Pain in right hand: Secondary | ICD-10-CM

## 2014-11-22 DIAGNOSIS — R449 Unspecified symptoms and signs involving general sensations and perceptions: Secondary | ICD-10-CM

## 2014-11-22 NOTE — Patient Instructions (Signed)
Pt to cont with wrist splint and isotoner glove until appt with MD   and contrast  Tendon and Med N glides

## 2014-11-22 NOTE — Telephone Encounter (Signed)
Pt notified, wrote down new instructions, and  verbalized understanding

## 2014-11-22 NOTE — Therapy (Signed)
[facility] [address], [place], [address] Phone: [phone]   Fax:  [phone]  Occupational Therapy Treatment  Patient Details  Name: [name] MRN: [id number] Date of Birth: 20-Jan-1946 Referring Provider:  Emmaline Kluver.,*  Encounter Date: 11/22/2014      OT End of Session - 11/22/14 1553    Visit Number 2   Number of Visits 4   Date for OT Re-Evaluation 12/04/14   OT Start Time 8466   OT Stop Time 1440   OT Time Calculation (min) 42 min   Activity Tolerance Patient tolerated treatment well   Behavior During Therapy [facility] for tasks assessed/performed      Past Medical History  Diagnosis Date  . Hyperlipidemia     Lipitor trial was a failure  . Hypertension   . Diabetes mellitus   . High cholesterol   . Gastritis     Past Surgical History  Procedure Laterality Date  . Spine surgery  1995  . Tubal ligation    . Spine surgery      bone graft   . Cardiac catheterization  Augus 2007    normal coronaries,  EF 60%    There were no vitals filed for this visit.  Visit Diagnosis:  Sensory deficit, right  Pain of right hand  Muscle weakness      Subjective Assessment - 11/22/14 1545    Subjective  We came back and on airplane both my hands and wrist swolled up - even my L hand now bothering me - I have no strength in both hands - about the same - my R middle finger still bothering me - pins and needles as well as tight ness    Patient Stated Goals Want the pain better , pins and needles better  - use my hand normally    Currently in Pain? Yes   Pain Score 2    Pain Location Hand   Pain Orientation Right   Pain Type Chronic pain   Pain Onset More than a month ago   Pain Frequency Constant            [facility] OT Assessment - 11/22/14 0001    Strength   Right Hand Grip (lbs) 21   Right Hand Lateral Pinch 8 lbs   Right Hand 3 Point Pinch 9 lbs   Left Hand Grip (lbs) 15    Left Hand Lateral Pinch 8 lbs   Left Hand 3 Point Pinch 7 lbs   Right Hand AROM   R Long PIP 0-100 --  -10   R Long DIP 0-70 55 Degrees                  OT Treatments/Exercises (OP) - 11/22/14 0001    Hand Exercises   Other Hand Exercises Tendon gliding to cont with and Med N glides    Splinting   Splinting Fitted with new isotoner glove and to cont with wrist splint until appt with MD    Manual Therapy   Manual therapy comments Positive for Tinel and Phalens test                 OT Education - 11/22/14 1552    Education provided Yes   Education Details See pt instruction   Person(s) Educated Patient   Methods Explanation;Demonstration   Comprehension Verbalized understanding;Returned demonstration;Verbal cues required          OT Short  Term Goals - 11/22/14 1556    OT SHORT TERM GOAL #1   Title Pain and sensory issues to decrease in 3rd digit to 3 episodes during daytime    Status On-going   OT SHORT TERM GOAL #2   Title Pt to be in HEP for splint wearing, tendon glding and Neve gliding to decrease symptoms    Status On-going           OT Long Term Goals - 11/22/14 1557    OT LONG TERM GOAL #1   Title Pt to verbalize 3 modifications and positions to avoid  during ADL's and IADL's to decrease symptoms    Status On-going               Plan - 11/22/14 1554    Clinical Impression Statement Pt present with reports of sensory changes to R 3rd digit- after doing HEP and being out of country - pt cont to test positive for Tinel over CT on R and Phalangs test for CT - grip is same but 3 point nad lateral grip decreased - L hand pt report now increase pain and show 50% decrease in grip - and prehesion also decreased some - pt refer  back to Dr Jefm Bryant for more testing - ? Nerve conduction test  for CTS    Pt will benefit from skilled therapeutic intervention in order to improve on the following deficits (Retired) Decreased range of  motion;Impaired flexibility;Impaired sensation;Increased edema;Pain;Decreased strength;Decreased knowledge of precautions   Rehab Potential Fair   OT Treatment/Interventions Self-care/ADL training;Parrafin;Ultrasound;Splinting;DME and/or AE instruction;Contrast Bath;Therapeutic exercises;Manual Therapy   Plan Pt to follow up with MD  for more assessment    OT Home Exercise Plan see pt instruction    Consulted and Agree with Plan of Care Patient          G-Codes - 11/23/14 1242    Functional Assessment Tool Used ROM ,strength, provacitive tests, clnicial judgement   Functional Limitation Self care   Self Care Current Status (T0160) At least 20 percent but less than 40 percent impaired, limited or restricted   Self Care Goal Status (F0932) At least 1 percent but less than 20 percent impaired, limited or restricted      Problem List Patient Active Problem List   Diagnosis Date Noted  . Long-term use of high-risk medication 09/22/2014  . Wheezing 08/18/2014  . Screening for osteoporosis 07/03/2014  . Inflammatory arthritis 05/28/2014  . Special screening for malignant neoplasms, colon 01/11/2014  . Normal pap smear, positive high risk HPV in 02/28/14 and 03/08/14 02/28/2013  . Gastritis   . Fatty liver disease, nonalcoholic 35/57/3220  . Chest pain at rest 07/07/2011  . Iron deficiency anemia 02/11/2011  . DM (diabetes mellitus), type 2, uncontrolled 02/11/2011  . Hypertension 02/11/2011  . Hyperlipidemia     [name] OTR/L, CLT 11/22/2014, 3:58 PM  [facility] [address], [place], [address] Phone: [phone]   Fax:  [phone]

## 2014-11-22 NOTE — Telephone Encounter (Signed)
See other encounter regarding sugar readings.

## 2014-11-30 ENCOUNTER — Telehealth: Payer: Self-pay | Admitting: Internal Medicine

## 2014-11-30 NOTE — Telephone Encounter (Signed)
Placed in folder CBG readings .

## 2014-11-30 NOTE — Telephone Encounter (Signed)
Pt dropped of letter. Letter in Dr. Lupita Dawn box/msn

## 2014-12-03 ENCOUNTER — Telehealth: Payer: Self-pay | Admitting: Internal Medicine

## 2014-12-03 NOTE — Telephone Encounter (Signed)
Blood sugars reviewed,  Confirm that she has been taking 15 units of 70/30 before breakfast, none at lunch,  And 10 units before dinner  If this is what she has been doing,  Based on recent sugars,  Needs to increase morning dose of insulin by 5 units to 20 units  And continue 10 units before dinner

## 2014-12-03 NOTE — Telephone Encounter (Signed)
Patient confirmed dosage, and patient understood doage and verbailized understanding.

## 2014-12-10 ENCOUNTER — Other Ambulatory Visit: Payer: Self-pay

## 2014-12-10 MED ORDER — INSULIN ASPART PROT & ASPART (70-30 MIX) 100 UNIT/ML ~~LOC~~ SUSP
SUBCUTANEOUS | Status: DC
Start: 2014-12-10 — End: 2015-06-28

## 2014-12-10 NOTE — Telephone Encounter (Signed)
Sent in new script reflect ting the new dosage of 15 units at breakfast and 10 units at dinner with none at lunch. Notified patient.

## 2014-12-10 NOTE — Telephone Encounter (Signed)
Patient states she is taking twice as much now so she needs new rx stating for insurance company, please advise to what changes you want to make the the prescription.

## 2014-12-13 LAB — HM DIABETES EYE EXAM

## 2014-12-18 ENCOUNTER — Encounter: Payer: Self-pay | Admitting: Internal Medicine

## 2014-12-21 ENCOUNTER — Other Ambulatory Visit (INDEPENDENT_AMBULATORY_CARE_PROVIDER_SITE_OTHER): Payer: Medicare Other

## 2014-12-21 DIAGNOSIS — M353 Polymyalgia rheumatica: Secondary | ICD-10-CM

## 2014-12-21 DIAGNOSIS — E119 Type 2 diabetes mellitus without complications: Secondary | ICD-10-CM

## 2014-12-21 LAB — LIPID PANEL
Cholesterol: 139 mg/dL (ref 0–200)
HDL: 59.3 mg/dL (ref >39.00)
LDL Cholesterol: 66 mg/dL (ref 0–99)
NonHDL: 79.68
Total CHOL/HDL Ratio: 2
Triglycerides: 70 mg/dL (ref 0.0–149.0)
VLDL: 14 mg/dL (ref 0.0–40.0)

## 2014-12-21 LAB — COMPREHENSIVE METABOLIC PANEL
ALT: 21 U/L (ref 0–35)
AST: 22 U/L (ref 0–37)
Albumin: 3.7 g/dL (ref 3.5–5.2)
Alkaline Phosphatase: 66 U/L (ref 39–117)
BUN: 13 mg/dL (ref 6–23)
CO2: 29 mEq/L (ref 19–32)
Calcium: 9 mg/dL (ref 8.4–10.5)
Chloride: 102 mEq/L (ref 96–112)
Creatinine, Ser: 0.66 mg/dL (ref 0.40–1.20)
GFR: 94.71 mL/min (ref >60.00)
Glucose, Bld: 87 mg/dL (ref 70–99)
Potassium: 3.8 mEq/L (ref 3.5–5.1)
Sodium: 139 mEq/L (ref 135–145)
Total Bilirubin: 0.7 mg/dL (ref 0.2–1.2)
Total Protein: 6.4 g/dL (ref 6.0–8.3)

## 2014-12-21 LAB — MICROALBUMIN / CREATININE URINE RATIO
Creatinine,U: 156 mg/dL
Microalb Creat Ratio: 0.5 mg/g (ref 0.0–30.0)
Microalb, Ur: 0.8 mg/dL (ref 0.0–1.9)

## 2014-12-21 LAB — HEMOGLOBIN A1C: Hgb A1c MFr Bld: 8.3 % — ABNORMAL HIGH (ref 4.6–6.5)

## 2014-12-21 LAB — SEDIMENTATION RATE: Sed Rate: 12 mm/hr (ref 0–22)

## 2014-12-21 NOTE — Addendum Note (Signed)
Addended by: Kerin Salen R on: 12/21/2014 11:30 AM   Modules accepted: Orders

## 2014-12-24 ENCOUNTER — Ambulatory Visit (INDEPENDENT_AMBULATORY_CARE_PROVIDER_SITE_OTHER): Payer: Medicare Other | Admitting: Internal Medicine

## 2014-12-24 ENCOUNTER — Encounter: Payer: Self-pay | Admitting: Internal Medicine

## 2014-12-24 VITALS — BP 128/72 | HR 71 | Temp 97.8°F | Resp 14 | Ht 66.0 in | Wt 178.5 lb

## 2014-12-24 DIAGNOSIS — I10 Essential (primary) hypertension: Secondary | ICD-10-CM | POA: Diagnosis not present

## 2014-12-24 DIAGNOSIS — E1165 Type 2 diabetes mellitus with hyperglycemia: Secondary | ICD-10-CM | POA: Diagnosis not present

## 2014-12-24 DIAGNOSIS — M069 Rheumatoid arthritis, unspecified: Secondary | ICD-10-CM | POA: Diagnosis not present

## 2014-12-24 DIAGNOSIS — IMO0002 Reserved for concepts with insufficient information to code with codable children: Secondary | ICD-10-CM

## 2014-12-24 DIAGNOSIS — E785 Hyperlipidemia, unspecified: Secondary | ICD-10-CM | POA: Diagnosis not present

## 2014-12-24 NOTE — Assessment & Plan Note (Signed)
Well controlled on current regimen. Renal function stable, no changes today.  Lab Results  Component Value Date   CREATININE 0.66 12/21/2014   Lab Results  Component Value Date   NA 139 12/21/2014   K 3.8 12/21/2014   CL 102 12/21/2014   CO2 29 12/21/2014

## 2014-12-24 NOTE — Progress Notes (Signed)
Subjective:  Patient ID: [name], female    DOB: Oct 01, 1945  Age: 69 y.o. MRN: [id number]  CC: The primary encounter diagnosis was Rheumatoid arthritis. Diagnoses of Essential hypertension, DM (diabetes mellitus), type 2, uncontrolled, and Hyperlipidemia were also pertinent to this visit.  HPI LAKENA SPARLIN presents for follow up on diabetes mellitus.  She saw Dr Jefm Bryant today and her prednisone dose was  was reduced to 18.5 mg today. She has been checking sugars 2 to 3 times daily and submitting them for insulin adjustment . Sugars have been labile this past summer due largely to travel, including a trip to [place] , as well as the beach.  In spite of eating gelato/ice cream nearly daily,  she has lost weight and a1c is improving  .She is walking a mile daily and going to the gym   Current regimen:  20 units of 70/30 in the am and 10 units in the evening,  Increased to 15 units for bs  > 200.  Feels better when her sugars are on the low side.  Tolerating her other medications without side effects..     Outpatient Prescriptions Prior to Visit  Medication Sig Dispense Refill  . aspirin 81 MG tablet Take 1 tablet (81 mg total) by mouth daily. 30 tablet 2  . atorvastatin (LIPITOR) 40 MG tablet Take 1 tablet (40 mg total) by mouth daily at 6 PM. (Patient taking differently: Take 20 mg by mouth daily at 6 PM. ) 90 tablet 1  . blood glucose meter kit and supplies KIT Dispense based on patient and insurance preference. Use up to four times daily as directed. (FOR ICD-9 250.00, 250.01). 1 each 0  . calcium-vitamin D (OSCAL WITH D) 250-125 MG-UNIT per tablet Take 2 tablets by mouth daily.    . famotidine (PEPCID) 40 MG tablet TAKE 1 TABLET (40 MG TOTAL) BY MOUTH DAILY. 30 tablet 1  . fish oil-omega-3 fatty acids 1000 MG capsule Take 2 g by mouth 2 (two) times daily.      . folic acid (FOLVITE) 1 MG tablet Take 1 tablet by mouth daily.    Marland Kitchen glucose blood (ACCU-CHEK AVIVA PLUS) test strip test CBG  three times daily,  E11.65 100 each 5  . insulin aspart protamine- aspart (NOVOLOG MIX 70/30) (70-30) 100 UNIT/ML injection Inject 15 units at breakfast , None at lunch, and 10 units at dinner. 10 mL 11  . Insulin Syringes, Disposable, U-100 1 ML MISC Use to inject insulin Twice daily 100 each 3  . losartan (COZAAR) 50 MG tablet TAKE 1 TABLET (50 MG TOTAL) BY MOUTH DAILY. 90 tablet 1  . metFORMIN (GLUCOPHAGE) 500 MG tablet Take 1 tablet (500 mg total) by mouth 2 (two) times daily with a meal. 180 tablet 3  . Multiple Vitamin (MULTIVITAMIN) tablet Take 1 tablet by mouth daily.    . predniSONE (DELTASONE) 5 MG tablet Take 15 mg by mouth daily with breakfast.     . vitamin C (ASCORBIC ACID) 500 MG tablet Take 500 mg by mouth daily.    . vitamin E 100 UNIT capsule Take 100 Units by mouth daily.    Marland Kitchen glipiZIDE (GLUCOTROL) 10 MG tablet 10 mg in Am and 5 mg in PM before meals (Patient not taking: Reported on 12/24/2014) 135 tablet 1  . HYDROcodone-acetaminophen (NORCO/VICODIN) 5-325 MG per tablet Take 1 tablet by mouth every 6 (six) hours as needed for moderate pain. (Patient not taking: Reported on 12/24/2014) 60 tablet  0  . methotrexate (RHEUMATREX) 2.5 MG tablet Take by mouth. Take 1 by mouth every 7 days. 8 pills once a week with a meal.    . insulin aspart (NOVOLOG) 100 UNIT/ML injection 5 units SQ  before lunch (Patient not taking: Reported on 12/24/2014) 1 vial PRN   No facility-administered medications prior to visit.    Review of Systems;  Patient denies headache, fevers, malaise, unintentional weight loss, skin rash, eye pain, sinus congestion and sinus pain, sore throat, dysphagia,  hemoptysis , cough, dyspnea, wheezing, chest pain, palpitations, orthopnea, edema, abdominal pain, nausea, melena, diarrhea, constipation, flank pain, dysuria, hematuria, urinary  Frequency, nocturia, numbness, tingling, seizures,  Focal weakness, Loss of consciousness,  Tremor, insomnia, depression, anxiety, and  suicidal ideation.      Objective:  BP 128/72 mmHg  Pulse 71  Temp(Src) 97.8 F (36.6 C) (Oral)  Resp 14  Ht _0  (1.676 m)  Wt 178 lb 8 oz (80.967 kg)  BMI 28.82 kg/m2  SpO2 98%  BP Readings from Last 3 Encounters:  12/24/14 128/72  09/19/14 126/72  09/12/14 122/74    Wt Readings from Last 3 Encounters:  12/24/14 178 lb 8 oz (80.967 kg)  09/24/14 177 lb (80.287 kg)  09/19/14 182 lb 8 oz (82.781 kg)    General appearance: alert, cooperative and appears stated age Ears: normal TM's and external ear canals both ears Throat: lips, mucosa, and tongue normal; teeth and gums normal Neck: no adenopathy, no carotid bruit, supple, symmetrical, trachea midline and thyroid not enlarged, symmetric, no tenderness/mass/nodules Back: symmetric, no curvature. ROM normal. No CVA tenderness. Lungs: clear to auscultation bilaterally Heart: regular rate and rhythm, S1, S2 normal, no murmur, click, rub or gallop Abdomen: soft, non-tender; bowel sounds normal; no masses,  no organomegaly Pulses: 2+ and symmetric Skin: Skin color, texture, turgor normal. No rashes or lesions Lymph nodes: Cervical, supraclavicular, and axillary nodes normal.  Lab Results  Component Value Date   HGBA1C 8.3* 12/21/2014   HGBA1C 8.5* 09/17/2014   HGBA1C 7.7* 05/14/2014    Lab Results  Component Value Date   CREATININE 0.66 12/21/2014   CREATININE 0.73 09/17/2014   CREATININE 0.6 05/14/2014    Lab Results  Component Value Date   WBC 7.2 09/17/2014   HGB 12.7 09/17/2014   HCT 38.3 09/17/2014   PLT 358.0 09/17/2014   GLUCOSE 87 12/21/2014   CHOL 139 12/21/2014   TRIG 70.0 12/21/2014   HDL 59.30 12/21/2014   LDLDIRECT 77.8 06/14/2012   LDLCALC 66 12/21/2014   ALT 21 12/21/2014   AST 22 12/21/2014   NA 139 12/21/2014   K 3.8 12/21/2014   CL 102 12/21/2014   CREATININE 0.66 12/21/2014   BUN 13 12/21/2014   CO2 29 12/21/2014   TSH 2.12 05/14/2014   HGBA1C 8.3* 12/21/2014   MICROALBUR 0.8  12/21/2014    No results found.  Assessment & Plan:   Problem List Items Addressed This Visit      Unprioritized   Hyperlipidemia    LDL and triglycerides are at goal on current medications. sHe has no side effects and liver enzymes are normal. No changes today.  Lab Results  Component Value Date   CHOL 139 12/21/2014   HDL 59.30 12/21/2014   LDLCALC 66 12/21/2014   LDLDIRECT 77.8 06/14/2012   TRIG 70.0 12/21/2014   CHOLHDL 2 12/21/2014   Lab Results  Component Value Date   ALT 21 12/21/2014   AST 22 12/21/2014   ALKPHOS 66  12/21/2014   BILITOT 0.7 12/21/2014          DM (diabetes mellitus), type 2, uncontrolled    Continue 70/30 insulin 20 units QAM and 10 to 15 units QPM.  Increased metformin to 1000 mg twice daily  a1c improved but not at goal.  Glipizide was causing recurrent hypoglycemia. May resume glipizide in a few weeks if BS do not improve.       Hypertension    Well controlled on current regimen. Renal function stable, no changes today.  Lab Results  Component Value Date   CREATININE 0.66 12/21/2014   Lab Results  Component Value Date   NA 139 12/21/2014   K 3.8 12/21/2014   CL 102 12/21/2014   CO2 29 12/21/2014         Rheumatoid arthritis - Primary    Currently managed with 18.5 mg prednisone daily and SQ MTX.  Has had flares of pain when predniosne drops below 15 mg in tehh past       Relevant Medications   Methotrexate, Anti-Rheumatic, (METHOTREXATE, PF, [place])      I have discontinued Ms. Febres insulin aspart. I am also having her maintain her fish oil-omega-3 fatty acids, vitamin C, vitamin E, multivitamin, aspirin, metFORMIN, calcium-vitamin D, Insulin Syringes (Disposable), predniSONE, blood glucose meter kit and supplies, methotrexate, folic acid, HYDROcodone-acetaminophen, atorvastatin, glipiZIDE, losartan, glucose blood, famotidine, insulin aspart protamine- aspart, and (Methotrexate, Anti-Rheumatic, (METHOTREXATE, PF, [place])).  Meds  ordered this encounter  Medications  . Methotrexate, Anti-Rheumatic, (METHOTREXATE, PF, [place])    Sig: Inject into the skin.    Medications Discontinued During This Encounter  Medication Reason  . insulin aspart (NOVOLOG) 100 UNIT/ML injection Error    Follow-up: Return in about 3 months (around 03/26/2015) for follow up diabetes.   Crecencio Mc, MD

## 2014-12-24 NOTE — Progress Notes (Signed)
Pre-visit discussion using our clinic review tool. No additional management support is needed unless otherwise documented below in the visit note.  

## 2014-12-24 NOTE — Assessment & Plan Note (Signed)
Continue 70/30 insulin 20 units QAM and 10 to 15 units QPM.  Increased metformin to 1000 mg twice daily  a1c improved but not at goal.  Glipizide was causing recurrent hypoglycemia. May resume glipizide in a few weeks if BS do not improve.

## 2014-12-24 NOTE — Assessment & Plan Note (Signed)
LDL and triglycerides are at goal on current medications. sHe has no side effects and liver enzymes are normal. No changes today.  Lab Results  Component Value Date   CHOL 139 12/21/2014   HDL 59.30 12/21/2014   LDLCALC 66 12/21/2014   LDLDIRECT 77.8 06/14/2012   TRIG 70.0 12/21/2014   CHOLHDL 2 12/21/2014   Lab Results  Component Value Date   ALT 21 12/21/2014   AST 22 12/21/2014   ALKPHOS 66 12/21/2014   BILITOT 0.7 12/21/2014

## 2014-12-24 NOTE — Patient Instructions (Addendum)
Welcome back from your travels!!  Your diabetes  Control is improving but not yet at goal.   We are increasing the metformin to 1000 mg twice daily   If you do not tolerate this because of loose stools ,  Call for XR version (less diarrhea)    Your cholesterol is at goal on  Your current dose of atorvastatin, and your liver enzymes are normal.

## 2014-12-24 NOTE — Assessment & Plan Note (Signed)
Currently managed with 18.5 mg prednisone daily and SQ MTX.  Has had flares of pain when predniosne drops below 15 mg in tehh past

## 2014-12-28 ENCOUNTER — Other Ambulatory Visit: Payer: Self-pay

## 2014-12-28 LAB — HM DIABETES FOOT EXAM: HM Diabetic Foot Exam: NORMAL

## 2014-12-28 MED ORDER — FAMOTIDINE 40 MG PO TABS: ORAL_TABLET | ORAL | Status: DC

## 2014-12-28 MED ORDER — FAMOTIDINE 40 MG PO TABS: 40.0000 mg | ORAL_TABLET | Freq: Every day | ORAL | Status: DC

## 2014-12-28 MED ORDER — GLUCOSE BLOOD VI STRP: ORAL_STRIP | Status: DC

## 2015-01-09 ENCOUNTER — Telehealth: Payer: Self-pay | Admitting: Internal Medicine

## 2015-01-09 NOTE — Telephone Encounter (Signed)
Blood sugars reviewed .  The majority of the pre dinner readings are high, but the mornings are fine and most of the lunchtime sugars are ok.  I want her to add 5 mg of glipizide at lunchtime, d Continue your current dose of 70/30 at breakfast and dinner,  And  change her  testing to 2 hours after lunch and 2 hours after dinner.

## 2015-01-09 NOTE — Telephone Encounter (Signed)
Patient notified of regimen to add glipizide 5 mg at lunch and to continue normal regimen of insulin and to change CBG schedule .

## 2015-01-09 NOTE — Telephone Encounter (Signed)
Left message for patient to call office.  

## 2015-01-17 ENCOUNTER — Telehealth: Payer: Self-pay | Admitting: Internal Medicine

## 2015-01-17 NOTE — Telephone Encounter (Signed)
BS reviewed.  I agree with her that the addition of glipizide is dropping her post prandial dinner sugars too low.  Stop the glipzide at dinner ,  contnue 70/30 insulin at current does and resubmit in 2 weeks

## 2015-01-17 NOTE — Telephone Encounter (Signed)
Patient notified and voiced understanding.

## 2015-01-18 ENCOUNTER — Telehealth: Payer: Self-pay | Admitting: Internal Medicine

## 2015-01-18 ENCOUNTER — Other Ambulatory Visit: Payer: Self-pay | Admitting: Internal Medicine

## 2015-01-18 NOTE — Telephone Encounter (Signed)
Pharmacist called states pt states Metformin was to be increased.  Spoke with Dr Derrel Nip, she states Metformin rx was not changed.  Pt is to take 500 mg twice daily.  Advised pharmacist who verbalized understanding.

## 2015-01-24 ENCOUNTER — Telehealth: Payer: Self-pay | Admitting: *Deleted

## 2015-01-24 NOTE — Telephone Encounter (Signed)
IN red folder 

## 2015-01-24 NOTE — Telephone Encounter (Signed)
Patient dropped off her glucose reading for Dr Derrel Nip to review. Envelope is in the box- Thanks

## 2015-01-25 NOTE — Telephone Encounter (Signed)
If she has reduced the glipizide to 5 mg and is still having the low sugars,  Have her Stop the glipizide completely> If she is still taking 10 mg,  Drop to 5 mg and take before lunch only; call her on cell [phone]

## 2015-01-29 NOTE — Telephone Encounter (Signed)
Patient reports that she is not taking glipizide in approximately 2 wks. Patient stated that she feels her after lunch sugars are running high and maybe she should take the glipizide in the morning.

## 2015-01-29 NOTE — Telephone Encounter (Signed)
Yes, resume glipizide at 5 mg before lunch

## 2015-01-29 NOTE — Telephone Encounter (Signed)
Left message to call office

## 2015-01-29 NOTE — Telephone Encounter (Signed)
Patient notified and voiced understanding. Glipizide 5 mg before lunch.

## 2015-03-07 ENCOUNTER — Telehealth: Payer: Self-pay | Admitting: Internal Medicine

## 2015-03-07 NOTE — Telephone Encounter (Signed)
My Chart message sent

## 2015-03-22 ENCOUNTER — Other Ambulatory Visit (HOSPITAL_COMMUNITY)
Admission: RE | Admit: 2015-03-22 | Discharge: 2015-03-22 | Disposition: A | Discharge status: Home / Self Care | Payer: Medicare Other | Source: Ambulatory Visit | Attending: Obstetrics & Gynecology | Admitting: Obstetrics & Gynecology

## 2015-03-22 ENCOUNTER — Encounter: Payer: Self-pay | Admitting: Obstetrics & Gynecology

## 2015-03-22 ENCOUNTER — Telehealth: Payer: Self-pay | Admitting: *Deleted

## 2015-03-22 ENCOUNTER — Other Ambulatory Visit (INDEPENDENT_AMBULATORY_CARE_PROVIDER_SITE_OTHER): Payer: Medicare Other

## 2015-03-22 ENCOUNTER — Ambulatory Visit (INDEPENDENT_AMBULATORY_CARE_PROVIDER_SITE_OTHER): Payer: Medicare Other | Admitting: Obstetrics & Gynecology

## 2015-03-22 VITALS — BP 119/77 | HR 80 | Resp 18 | Ht 66.0 in | Wt 179.0 lb

## 2015-03-22 DIAGNOSIS — Z79899 Other long term (current) drug therapy: Secondary | ICD-10-CM

## 2015-03-22 DIAGNOSIS — R87810 Cervical high risk human papillomavirus (HPV) DNA test positive: Secondary | ICD-10-CM | POA: Diagnosis not present

## 2015-03-22 DIAGNOSIS — Z01419 Encounter for gynecological examination (general) (routine) without abnormal findings: Secondary | ICD-10-CM | POA: Diagnosis not present

## 2015-03-22 DIAGNOSIS — Z1231 Encounter for screening mammogram for malignant neoplasm of breast: Secondary | ICD-10-CM | POA: Diagnosis not present

## 2015-03-22 DIAGNOSIS — E119 Type 2 diabetes mellitus without complications: Secondary | ICD-10-CM

## 2015-03-22 DIAGNOSIS — Z124 Encounter for screening for malignant neoplasm of cervix: Secondary | ICD-10-CM

## 2015-03-22 DIAGNOSIS — Z01411 Encounter for gynecological examination (general) (routine) with abnormal findings: Secondary | ICD-10-CM | POA: Diagnosis present

## 2015-03-22 DIAGNOSIS — Z1151 Encounter for screening for human papillomavirus (HPV): Secondary | ICD-10-CM | POA: Insufficient documentation

## 2015-03-22 LAB — LIPID PANEL
Cholesterol: 122 mg/dL (ref 0–200)
HDL: 53.7 mg/dL (ref >39.00)
LDL Cholesterol: 54 mg/dL (ref 0–99)
NonHDL: 68.46
Total CHOL/HDL Ratio: 2
Triglycerides: 70 mg/dL (ref 0.0–149.0)
VLDL: 14 mg/dL (ref 0.0–40.0)

## 2015-03-22 LAB — COMPREHENSIVE METABOLIC PANEL
ALT: 15 U/L (ref 0–35)
AST: 21 U/L (ref 0–37)
Albumin: 3.8 g/dL (ref 3.5–5.2)
Alkaline Phosphatase: 72 U/L (ref 39–117)
BUN: 16 mg/dL (ref 6–23)
CO2: 29 mEq/L (ref 19–32)
Calcium: 9.1 mg/dL (ref 8.4–10.5)
Chloride: 102 mEq/L (ref 96–112)
Creatinine, Ser: 0.71 mg/dL (ref 0.40–1.20)
GFR: 86.99 mL/min (ref >60.00)
Glucose, Bld: 111 mg/dL — ABNORMAL HIGH (ref 70–99)
Potassium: 4.2 mEq/L (ref 3.5–5.1)
Sodium: 141 mEq/L (ref 135–145)
Total Bilirubin: 0.8 mg/dL (ref 0.2–1.2)
Total Protein: 6.4 g/dL (ref 6.0–8.3)

## 2015-03-22 LAB — CBC WITH DIFFERENTIAL/PLATELET
Basophils Absolute: 0 10*3/uL (ref 0.0–0.1)
Basophils Relative: 0.4 % (ref 0.0–3.0)
Eosinophils Absolute: 0.1 10*3/uL (ref 0.0–0.7)
Eosinophils Relative: 1.3 % (ref 0.0–5.0)
HCT: 39.9 % (ref 36.0–46.0)
Hemoglobin: 12.7 g/dL (ref 12.0–15.0)
Lymphocytes Relative: 25.1 % (ref 12.0–46.0)
Lymphs Abs: 1.7 10*3/uL (ref 0.7–4.0)
MCHC: 31.8 g/dL (ref 30.0–36.0)
MCV: 89 fl (ref 78.0–100.0)
Monocytes Absolute: 0.4 10*3/uL (ref 0.1–1.0)
Monocytes Relative: 5.5 % (ref 3.0–12.0)
Neutro Abs: 4.5 10*3/uL (ref 1.4–7.7)
Neutrophils Relative %: 67.7 % (ref 43.0–77.0)
Platelets: 326 10*3/uL (ref 150.0–400.0)
RBC: 4.48 Mil/uL (ref 3.87–5.11)
RDW: 16.7 % — ABNORMAL HIGH (ref 11.5–15.5)
WBC: 6.6 10*3/uL (ref 4.0–10.5)

## 2015-03-22 LAB — LDL CHOLESTEROL, DIRECT: Direct LDL: 60 mg/dL

## 2015-03-22 LAB — HEMOGLOBIN A1C: Hgb A1c MFr Bld: 7.2 % — ABNORMAL HIGH (ref 4.6–6.5)

## 2015-03-22 NOTE — Progress Notes (Signed)
GYNECOLOGY CLINIC ANNUAL PREVENTATIVE CARE ENCOUNTER NOTE  Subjective:   [name] is a 69 y.o. [id number] female here for a routine annual gynecologic exam and follow up pap smear.  Current complaints: none.   Denies abnormal vaginal bleeding, discharge, pelvic pain, problems with intercourse or other gynecologic concerns.    Gynecologic History No LMP recorded. Patient is postmenopausal. 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  Negative HPV 16/18 genotyping on 03/08/14. Last mammogram: 03/20/2014. Results were: normal  Obstetric History OB History  Gravida Para Term Preterm AB SAB TAB Ectopic Multiple Living  _0 # Outcome Date GA Lbr Len/2nd Weight Sex Delivery Anes PTL Lv  2 Term     M Vag-Spont     1 Term     M Vag-Spont         Past Medical History  Diagnosis Date  . Hyperlipidemia     Lipitor trial was a failure  . Hypertension   . Diabetes mellitus   . High cholesterol   . Gastritis     Past Surgical History  Procedure Laterality Date  . Spine surgery  1995  . Tubal ligation    . Spine surgery      bone graft   . Cardiac catheterization  Augus 2007    normal coronaries,  EF 60%    Current Outpatient Prescriptions on File Prior to Visit  Medication Sig Dispense Refill  . aspirin 81 MG tablet Take 1 tablet (81 mg total) by mouth daily. 30 tablet 2  . atorvastatin (LIPITOR) 40 MG tablet Take 1 tablet (40 mg total) by mouth daily at 6 PM. (Patient taking differently: Take 20 mg by mouth daily at 6 PM. ) 90 tablet 1  . blood glucose meter kit and supplies KIT Dispense based on patient and insurance preference. Use up to four times daily as directed. (FOR ICD-9 250.00, 250.01). 1 each 0  . calcium-vitamin D (OSCAL WITH D) 250-125 MG-UNIT per tablet Take 2 tablets by mouth daily.    . famotidine (PEPCID) 40 MG tablet Take 1 tablet (40 mg total) by mouth daily. 90 tablet 1  .  fish oil-omega-3 fatty acids 1000 MG capsule Take 2 g by mouth 2 (two) times daily.      . folic acid (FOLVITE) 1 MG tablet Take 1 tablet by mouth daily.    Marland Kitchen glipiZIDE (GLUCOTROL) 10 MG tablet 10 mg in Am and 5 mg in PM before meals 135 tablet 1  . glucose blood (ACCU-CHEK AVIVA PLUS) test strip test CBG three times daily,  E11.65 300 each 3  . losartan (COZAAR) 50 MG tablet TAKE 1 TABLET (50 MG TOTAL) BY MOUTH DAILY. 90 tablet 1  . metFORMIN (GLUCOPHAGE) 500 MG tablet TAKE 1 TABLET (500 MG TOTAL) BY MOUTH 2 (TWO) TIMES DAILY WITH A MEAL. 180 tablet 3  . methotrexate (RHEUMATREX) 2.5 MG tablet Take by mouth. Take 1 by mouth every 7 days. 8 pills once a week with a meal.    . Methotrexate, Anti-Rheumatic, (METHOTREXATE, PF, [place]) Inject into the skin.    . Multiple Vitamin (MULTIVITAMIN) tablet Take 1 tablet by mouth daily.    . predniSONE (DELTASONE) 5 MG tablet Take 15 mg by mouth daily with breakfast.     . vitamin C (ASCORBIC ACID) 500 MG tablet Take 500 mg by mouth daily.    Marland Kitchen  vitamin E 100 UNIT capsule Take 100 Units by mouth daily.    Marland Kitchen HYDROcodone-acetaminophen (NORCO/VICODIN) 5-325 MG per tablet Take 1 tablet by mouth every 6 (six) hours as needed for moderate pain. (Patient not taking: Reported on 12/24/2014) 60 tablet 0  . insulin aspart protamine- aspart (NOVOLOG MIX 70/30) (70-30) 100 UNIT/ML injection Inject 15 units at breakfast , None at lunch, and 10 units at dinner. (Patient not taking: Reported on 03/22/2015) 10 mL 11  . Insulin Syringes, Disposable, U-100 1 ML MISC Use to inject insulin Twice daily (Patient not taking: Reported on 03/22/2015) 100 each 3  . [DISCONTINUED] ferrous sulfate 325 (65 FE) MG tablet Take 325 mg by mouth daily with breakfast.      . [DISCONTINUED] omeprazole (PRILOSEC) 40 MG capsule Take 1 capsule (40 mg total) by mouth daily. 30 capsule 1   No current facility-administered medications on file prior to visit.    Allergies  Allergen Reactions  .  Amoxicillin Hives  . Latex Rash    Social History   Social History  . Marital Status: Married    Spouse Name: N/A  . Number of Children: N/A  . 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 Smoking status: Never Smoker   . Smokeless tobacco: Never Used  . Alcohol Use: No  . Drug Use: No  . Sexual Activity:    Partners: Male    Birth Control/ Protection: None   Other Topics Concern  . Not on file   Social History Narrative    Family History  Problem Relation Age of Onset  . Diabetes Mother   . Heart disease Father   . Stroke Father   . Diabetes Father   . Diabetes Maternal Grandfather   . Diabetes Brother     The following portions of the patient's history were reviewed and updated as appropriate: allergies, current medications, past family history, past medical history, past social history, past surgical history and problem list.  Review of Systems Pertinent items noted in HPI and remainder of comprehensive ROS otherwise negative.   Objective:  BP 119/77 mmHg  Pulse 80  Resp 18  Ht 5' 6" (1.676 m)  Wt 179 lb (81.194 kg)  BMI 28.91 kg/m2 CONSTITUTIONAL: Well-developed, well-nourished female in no acute distress.  HENT:  Normocephalic, atraumatic, External right and left ear normal. Oropharynx is clear and moist EYES: Conjunctivae and EOM are normal. Pupils are equal, round, and reactive to light. No scleral icterus.  NECK: Normal range of motion, supple, no masses.  Normal thyroid.  SKIN: Skin is warm and dry. No rash noted. Not diaphoretic. No erythema. No pallor. [place]: Alert and oriented to person, place, and time. Normal reflexes, muscle tone coordination. No cranial nerve deficit noted. PSYCHIATRIC: Normal mood and affect. Normal behavior. Normal judgment and thought content. CARDIOVASCULAR: Normal heart rate noted, regular rhythm RESPIRATORY: Clear to auscultation bilaterally. Effort and  breath sounds normal, no problems with respiration noted. BREASTS: Symmetric in size. No masses, skin changes, nipple drainage, or lymphadenopathy. ABDOMEN: Soft, normal bowel sounds, no distention noted.  No tenderness, rebound or guarding.  PELVIC: Normal appearing external genitalia; normal appearing vaginal mucosa with moderate atrophy and cervix.  No abnormal discharge noted.  Pap smear obtained.  Normal uterine size, no other palpable masses, no uterine or adnexal tenderness. MUSCULOSKELETAL: Normal range of motion. No tenderness.  No cyanosis, clubbing, or edema.  2+ distal pulses.   Assessment:  Annual gynecologic examination with pap smear Normal cytology with positive HRHPV for past two years   Plan:  Will follow up results of pap smear and manage accordingly. Mammogram scheduled. Routine preventative health maintenance measures emphasized. Please refer to After Visit Summary for other counseling recommendations.    Verita Schneiders, MD, [place] Attending Obstetrician & Gynecologist, [place] for [facility]

## 2015-03-22 NOTE — Patient Instructions (Signed)
RE: MyChart  Dear [name]  We are excited to introduce MyChart, a new best-in-class service that provides you online access to important information in your electronic medical record. We want to make it easier for you to view your health information - all in one secure location - when and where you need it. We expect MyChart will enhance the quality of care and service we provide. Use the activation code below to enroll in MyChart online at [email or web address]  When you register for MyChart, you can:  Marland Kitchen View your test results. . Communicate securely with your physician's office.  . View your medical history, allergies, medications, and immunizations. . Conveniently print information such as your medication lists.  If you are age 15 or older and want a member of your family to have access to your record, you must provide written consent by completing a proxy form available at our facility. Please speak to our clinical staff about guidelines regarding accounts for patients younger than age 77.  As you activate your MyChart account and need any technical assistance, please call the MyChart technical support line at [phone]) or email your question to [email or web address]. If you email your question(s), please include your name, a return phone number and the best time to reach you.  Thank you for using MyChart as your new health and wellness resource!  MyChart Activation Code:  Activation code not generated Current MyChart Status: Active      Preventive Care for Adults, Female A healthy lifestyle and preventive care can promote health and wellness. Preventive health guidelines for women include the following key practices.  A routine yearly physical is a good way to check with your health care provider about your health and preventive screening. It is a chance to share any concerns and updates on your health and to receive a thorough exam.  Visit your  dentist for a routine exam and preventive care every 6 months. Brush your teeth twice a day and floss once a day. Good oral hygiene prevents tooth decay and gum disease.  The frequency of eye exams is based on your age, health, family medical history, use of contact lenses, and other factors. Follow your health care provider's recommendations for frequency of eye exams.  Eat a healthy diet. Foods like vegetables, fruits, whole grains, low-fat dairy products, and lean protein foods contain the nutrients you need without too many calories. Decrease your intake of foods high in solid fats, added sugars, and salt. Eat the right amount of calories for you.Get information about a proper diet from your health care provider, if necessary.  Regular physical exercise is one of the most important things you can do for your health. Most adults should get at least 150 minutes of moderate-intensity exercise (any activity that increases your heart rate and causes you to sweat) each week. In addition, most adults need muscle-strengthening exercises on 2 or more days a week.  Maintain a healthy weight. The body mass index (BMI) is a screening tool to identify possible weight problems. It provides an estimate of body fat based on height and weight. Your health care provider can find your BMI and can help you achieve or maintain a healthy weight.For adults 20 years and older:  A BMI below 18.5 is considered underweight.  A BMI of 18.5 to 24.9 is normal.  A BMI of 25 to 29.9 is considered overweight.  A BMI of 30 and above is considered obese.  Maintain normal blood lipids and  cholesterol levels by exercising and minimizing your intake of saturated fat. Eat a balanced diet with plenty of fruit and vegetables. Blood tests for lipids and cholesterol should begin at age 49 and be repeated every 5 years. If your lipid or cholesterol levels are high, you are over 50, or you are at high risk for heart disease, you may  need your cholesterol levels checked more frequently.Ongoing high lipid and cholesterol levels should be treated with medicines if diet and exercise are not working.  If you smoke, find out from your health care provider how to quit. If you do not use tobacco, do not start.  Lung cancer screening is recommended for adults aged 61-80 years who are at high risk for developing lung cancer because of a history of smoking. A yearly low-dose CT scan of the lungs is recommended for people who have at least a 30-pack-year history of smoking and are a current smoker or have quit within the past 15 years. A pack year of smoking is smoking an average of 1 pack of cigarettes a day for 1 year (for example: 1 pack a day for 30 years or 2 packs a day for 15 years). Yearly screening should continue until the smoker has stopped smoking for at least 15 years. Yearly screening should be stopped for people who develop a health problem that would prevent them from having lung cancer treatment.  If you are pregnant, do not drink alcohol. If you are breastfeeding, be very cautious about drinking alcohol. If you are not pregnant and choose to drink alcohol, do not have more than 1 drink per day. One drink is considered to be 12 ounces (355 mL) of beer, 5 ounces (148 mL) of wine, or 1.5 ounces (44 mL) of liquor.  Avoid use of street drugs. Do not share needles with anyone. Ask for help if you need support or instructions about stopping the use of drugs.  High blood pressure causes heart disease and increases the risk of stroke. Your blood pressure should be checked at least every 1 to 2 years. Ongoing high blood pressure should be treated with medicines if weight loss and exercise do not work.  If you are 65-53 years old, ask your health care provider if you should take aspirin to prevent strokes.  Diabetes screening is done by taking a blood sample to check your blood glucose level after you have not eaten for a certain  period of time (fasting). If you are not overweight and you do not have risk factors for diabetes, you should be screened once every 3 years starting at age 83. If you are overweight or obese and you are 62-24 years of age, you should be screened for diabetes every year as part of your cardiovascular risk assessment.  Breast cancer screening is essential preventive care for women. You should practice "breast self-awareness." This means understanding the normal appearance and feel of your breasts and may include breast self-examination. Any changes detected, no matter how small, should be reported to a health care provider. Women in their 35s and 30s should have a clinical breast exam (CBE) by a health care provider as part of a regular health exam every 1 to 3 years. After age 55, women should have a CBE every year. Starting at age 37, women should consider having a mammogram (breast X-ray test) every year. Women who have a family history of breast cancer should talk to their health care provider about genetic screening. Women at a  high risk of breast cancer should talk to their health care providers about having an MRI and a mammogram every year.  Breast cancer gene (BRCA)-related cancer risk assessment is recommended for women who have family members with BRCA-related cancers. BRCA-related cancers include breast, ovarian, tubal, and peritoneal cancers. Having family members with these cancers may be associated with an increased risk for harmful changes (mutations) in the breast cancer genes BRCA1 and BRCA2. Results of the assessment will determine the need for genetic counseling and BRCA1 and BRCA2 testing.  Your health care provider may recommend that you be screened regularly for cancer of the pelvic organs (ovaries, uterus, and vagina). This screening involves a pelvic examination, including checking for microscopic changes to the surface of your cervix (Pap test). You may be encouraged to have this  screening done every 3 years, beginning at age 39.  For women ages 32-65, health care providers may recommend pelvic exams and Pap testing every 3 years, or they may recommend the Pap and pelvic exam, combined with testing for human papilloma virus (HPV), every 5 years. Some types of HPV increase your risk of cervical cancer. Testing for HPV may also be done on women of any age with unclear Pap test results.  Other health care providers may not recommend any screening for nonpregnant women who are considered low risk for pelvic cancer and who do not have symptoms. Ask your health care provider if a screening pelvic exam is right for you.  If you have had past treatment for cervical cancer or a condition that could lead to cancer, you need Pap tests and screening for cancer for at least 20 years after your treatment. If Pap tests have been discontinued, your risk factors (such as having a new sexual partner) need to be reassessed to determine if screening should resume. Some women have medical problems that increase the chance of getting cervical cancer. In these cases, your health care provider may recommend more frequent screening and Pap tests.  Colorectal cancer can be detected and often prevented. Most routine colorectal cancer screening begins at the age of 58 years and continues through age 16 years. However, your health care provider may recommend screening at an earlier age if you have risk factors for colon cancer. On a yearly basis, your health care provider may provide home test kits to check for hidden blood in the stool. Use of a small camera at the end of a tube, to directly examine the colon (sigmoidoscopy or colonoscopy), can detect the earliest forms of colorectal cancer. Talk to your health care provider about this at age 49, when routine screening begins. Direct exam of the colon should be repeated every 5-10 years through age 24 years, unless early forms of precancerous polyps or small  growths are found.  People who are at an increased risk for hepatitis B should be screened for this virus. You are considered at high risk for hepatitis B if:  You were born in a country where hepatitis B occurs often. Talk with your health care provider about which countries are considered high risk.  Your parents were born in a high-risk country and you have not received a shot to protect against hepatitis B (hepatitis B vaccine).  You have HIV or AIDS.  You use needles to inject street drugs.  You live with, or have sex with, someone who has hepatitis B.  You get hemodialysis treatment.  You take certain medicines for conditions like cancer, organ transplantation, and autoimmune  conditions.  Hepatitis C blood testing is recommended for all people born from [birth year] through 1964 and any individual with known risks for hepatitis C.  Practice safe sex. Use condoms and avoid high-risk sexual practices to reduce the spread of sexually transmitted infections (STIs). STIs include gonorrhea, chlamydia, syphilis, trichomonas, herpes, HPV, and human immunodeficiency virus (HIV). Herpes, HIV, and HPV are viral illnesses that have no cure. They can result in disability, cancer, and death.  You should be screened for sexually transmitted illnesses (STIs) including gonorrhea and chlamydia if:  You are sexually active and are younger than 24 years.  You are older than 24 years and your health care provider tells you that you are at risk for this type of infection.  Your sexual activity has changed since you were last screened and you are at an increased risk for chlamydia or gonorrhea. Ask your health care provider if you are at risk.  If you are at risk of being infected with HIV, it is recommended that you take a prescription medicine daily to prevent HIV infection. This is called preexposure prophylaxis (PrEP). You are considered at risk if:  You are sexually active and do not regularly use  condoms or know the HIV status of your partner(s).  You take drugs by injection.  You are sexually active with a partner who has HIV.  Talk with your health care provider about whether you are at high risk of being infected with HIV. If you choose to begin PrEP, you should first be tested for HIV. You should then be tested every 3 months for as long as you are taking PrEP.  Osteoporosis is a disease in which the bones lose minerals and strength with aging. This can result in serious bone fractures or breaks. The risk of osteoporosis can be identified using a bone density scan. Women ages 21 years and over and women at risk for fractures or osteoporosis should discuss screening with their health care providers. Ask your health care provider whether you should take a calcium supplement or vitamin D to reduce the rate of osteoporosis.  Menopause can be associated with physical symptoms and risks. Hormone replacement therapy is available to decrease symptoms and risks. You should talk to your health care provider about whether hormone replacement therapy is right for you.  Use sunscreen. Apply sunscreen liberally and repeatedly throughout the day. You should seek shade when your shadow is shorter than you. Protect yourself by wearing long sleeves, pants, a wide-brimmed hat, and sunglasses year round, whenever you are outdoors.  Once a month, do a whole body skin exam, using a mirror to look at the skin on your back. Tell your health care provider of new moles, moles that have irregular borders, moles that are larger than a pencil eraser, or moles that have changed in shape or color.  Stay current with required vaccines (immunizations).  Influenza vaccine. All adults should be immunized every year.  Tetanus, diphtheria, and acellular pertussis (Td, Tdap) vaccine. Pregnant women should receive 1 dose of Tdap vaccine during each pregnancy. The dose should be obtained regardless of the length of time  since the last dose. Immunization is preferred during the 27th-36th week of gestation. An adult who has not previously received Tdap or who does not know her vaccine status should receive 1 dose of Tdap. This initial dose should be followed by tetanus and diphtheria toxoids (Td) booster doses every 10 years. Adults with an unknown or incomplete history of completing a  3-dose immunization series with Td-containing vaccines should begin or complete a primary immunization series including a Tdap dose. Adults should receive a Td booster every 10 years.  Varicella vaccine. An adult without evidence of immunity to varicella should receive 2 doses or a second dose if she has previously received 1 dose. Pregnant females who do not have evidence of immunity should receive the first dose after pregnancy. This first dose should be obtained before leaving the health care facility. The second dose should be obtained 4-8 weeks after the first dose.  Human papillomavirus (HPV) vaccine. Females aged 13-26 years who have not received the vaccine previously should obtain the 3-dose series. The vaccine is not recommended for use in pregnant females. However, pregnancy testing is not needed before receiving a dose. If a female is found to be pregnant after receiving a dose, no treatment is needed. In that case, the remaining doses should be delayed until after the pregnancy. Immunization is recommended for any person with an immunocompromised condition through the age of 12 years if she did not get any or all doses earlier. During the 3-dose series, the second dose should be obtained 4-8 weeks after the first dose. The third dose should be obtained 24 weeks after the first dose and 16 weeks after the second dose.  Zoster vaccine. One dose is recommended for adults aged 53 years or older unless certain conditions are present.  Measles, mumps, and rubella (MMR) vaccine. Adults born before [birth year] generally are considered immune to  measles and mumps. Adults born in [birth year] or later should have 1 or more doses of MMR vaccine unless there is a contraindication to the vaccine or there is laboratory evidence of immunity to each of the three diseases. A routine second dose of MMR vaccine should be obtained at least 28 days after the first dose for students attending postsecondary schools, health care workers, or international travelers. People who received inactivated measles vaccine or an unknown type of measles vaccine during 1963-1967 should receive 2 doses of MMR vaccine. People who received inactivated mumps vaccine or an unknown type of mumps vaccine before 1979 and are at high risk for mumps infection should consider immunization with 2 doses of MMR vaccine. For females of childbearing age, rubella immunity should be determined. If there is no evidence of immunity, females who are not pregnant should be vaccinated. If there is no evidence of immunity, females who are pregnant should delay immunization until after pregnancy. Unvaccinated health care workers born before [birth year] who lack laboratory evidence of measles, mumps, or rubella immunity or laboratory confirmation of disease should consider measles and mumps immunization with 2 doses of MMR vaccine or rubella immunization with 1 dose of MMR vaccine.  Pneumococcal 13-valent conjugate (PCV13) vaccine. When indicated, a person who is uncertain of his immunization history and has no record of immunization should receive the PCV13 vaccine. All adults 63 years of age and older should receive this vaccine. An adult aged 66 years or older who has certain medical conditions and has not been previously immunized should receive 1 dose of PCV13 vaccine. This PCV13 should be followed with a dose of pneumococcal polysaccharide (PPSV23) vaccine. Adults who are at high risk for pneumococcal disease should obtain the PPSV23 vaccine at least 8 weeks after the dose of PCV13 vaccine. Adults older than 68 years  of age who have normal immune system function should obtain the PPSV23 vaccine dose at least 1 year after the dose of PCV13 vaccine.  Pneumococcal polysaccharide (PPSV23) vaccine. When PCV13 is also indicated, PCV13 should be obtained first. All adults aged 82 years and older should be immunized. An adult younger than age 19 years who has certain medical conditions should be immunized. Any person who resides in a nursing home or long-term care facility should be immunized. An adult smoker should be immunized. People with an immunocompromised condition and certain other conditions should receive both PCV13 and PPSV23 vaccines. People with human immunodeficiency virus (HIV) infection should be immunized as soon as possible after diagnosis. Immunization during chemotherapy or radiation therapy should be avoided. Routine use of PPSV23 vaccine is not recommended for American Indians, [place] Natives, or people younger than 65 years unless there are medical conditions that require PPSV23 vaccine. When indicated, people who have unknown immunization and have no record of immunization should receive PPSV23 vaccine. One-time revaccination 5 years after the first dose of PPSV23 is recommended for people aged 19-64 years who have chronic kidney failure, nephrotic syndrome, asplenia, or immunocompromised conditions. People who received 1-2 doses of PPSV23 before age 65 years should receive another dose of PPSV23 vaccine at age 70 years or later if at least 5 years have passed since the previous dose. Doses of PPSV23 are not needed for people immunized with PPSV23 at or after age 5 years.  Meningococcal vaccine. Adults with asplenia or persistent complement component deficiencies should receive 2 doses of quadrivalent meningococcal conjugate (MenACWY-D) vaccine. The doses should be obtained at least 2 months apart. Microbiologists working with certain meningococcal bacteria, [place] recruits, people at risk during an  outbreak, and people who travel to or live in countries with a high rate of meningitis should be immunized. A first-year college student up through age 19 years who is living in a residence hall should receive a dose if she did not receive a dose on or after her 16th birthday. Adults who have certain high-risk conditions should receive one or more doses of vaccine.  Hepatitis A vaccine. Adults who wish to be protected from this disease, have certain high-risk conditions, work with hepatitis A-infected animals, work in hepatitis A research labs, or travel to or work in countries with a high rate of hepatitis A should be immunized. Adults who were previously unvaccinated and who anticipate close contact with an international adoptee during the first 60 days after arrival in the [place] from a country with a high rate of hepatitis A should be immunized.  Hepatitis B vaccine. Adults who wish to be protected from this disease, have certain high-risk conditions, may be exposed to blood or other infectious body fluids, are household contacts or sex partners of hepatitis B positive people, are clients or workers in certain care facilities, or travel to or work in countries with a high rate of hepatitis B should be immunized.  Haemophilus influenzae type b (Hib) vaccine. A previously unvaccinated person with asplenia or sickle cell disease or having a scheduled splenectomy should receive 1 dose of Hib vaccine. Regardless of previous immunization, a recipient of a hematopoietic stem cell transplant should receive a 3-dose series 6-12 months after her successful transplant. Hib vaccine is not recommended for adults with HIV infection. Preventive Services / Frequency Ages 19 to 37 years  Blood pressure check.** / Every 3-5 years.  Lipid and cholesterol check.** / Every 5 years beginning at age 43.  Clinical breast exam.** / Every 3 years for women in their 28s and 42s.  BRCA-related cancer risk  assessment.** / For women who  have family members with a BRCA-related cancer (breast, ovarian, tubal, or peritoneal cancers).  Pap test.** / Every 2 years from ages 87 through 67. Every 3 years starting at age 85 through age 27 or 60 with a history of 3 consecutive normal Pap tests.  HPV screening.** / Every 3 years from ages 24 through ages 24 to 52 with a history of 3 consecutive normal Pap tests.  Hepatitis C blood test.** / For any individual with known risks for hepatitis C.  Skin self-exam. / Monthly.  Influenza vaccine. / Every year.  Tetanus, diphtheria, and acellular pertussis (Tdap, Td) vaccine.** / Consult your health care provider. Pregnant women should receive 1 dose of Tdap vaccine during each pregnancy. 1 dose of Td every 10 years.  Varicella vaccine.** / Consult your health care provider. Pregnant females who do not have evidence of immunity should receive the first dose after pregnancy.  HPV vaccine. / 3 doses over 6 months, if 59 and younger. The vaccine is not recommended for use in pregnant females. However, pregnancy testing is not needed before receiving a dose.  Measles, mumps, rubella (MMR) vaccine.** / You need at least 1 dose of MMR if you were born in 1957 or later. You may also need a 2nd dose. For females of childbearing age, rubella immunity should be determined. If there is no evidence of immunity, females who are not pregnant should be vaccinated. If there is no evidence of immunity, females who are pregnant should delay immunization until after pregnancy.  Pneumococcal 13-valent conjugate (PCV13) vaccine.** / Consult your health care provider.  Pneumococcal polysaccharide (PPSV23) vaccine.** / 1 to 2 doses if you smoke cigarettes or if you have certain conditions.  Meningococcal vaccine.** / 1 dose if you are age 2 to 50 years and a Market researcher living in a residence hall, or have one of several medical conditions, you need to get vaccinated  against meningococcal disease. You may also need additional booster doses.  Hepatitis A vaccine.** / Consult your health care provider.  Hepatitis B vaccine.** / Consult your health care provider.  Haemophilus influenzae type b (Hib) vaccine.** / Consult your health care provider. Ages 43 to 69 years  Blood pressure check.** / Every year.  Lipid and cholesterol check.** / Every 5 years beginning at age 52 years.  Lung cancer screening. / Every year if you are aged 60-80 years and have a 30-pack-year history of smoking and currently smoke or have quit within the past 15 years. Yearly screening is stopped once you have quit smoking for at least 15 years or develop a health problem that would prevent you from having lung cancer treatment.  Clinical breast exam.** / Every year after age 28 years.  BRCA-related cancer risk assessment.** / For women who have family members with a BRCA-related cancer (breast, ovarian, tubal, or peritoneal cancers).  Mammogram.** / Every year beginning at age 29 years and continuing for as long as you are in good health. Consult with your health care provider.  Pap test.** / Every 3 years starting at age 87 years through age 17 or 76 years with a history of 3 consecutive normal Pap tests.  HPV screening.** / Every 3 years from ages 58 years through ages 36 to 74 years with a history of 3 consecutive normal Pap tests.  Fecal occult blood test (FOBT) of stool. / Every year beginning at age 100 years and continuing until age 4 years. You may not need to do this test  if you get a colonoscopy every 10 years.  Flexible sigmoidoscopy or colonoscopy.** / Every 5 years for a flexible sigmoidoscopy or every 10 years for a colonoscopy beginning at age 33 years and continuing until age 61 years.  Hepatitis C blood test.** / For all people born from [birth year] through 1964 and any individual with known risks for hepatitis C.  Skin self-exam. / Monthly.  Influenza vaccine. /  Every year.  Tetanus, diphtheria, and acellular pertussis (Tdap/Td) vaccine.** / Consult your health care provider. Pregnant women should receive 1 dose of Tdap vaccine during each pregnancy. 1 dose of Td every 10 years.  Varicella vaccine.** / Consult your health care provider. Pregnant females who do not have evidence of immunity should receive the first dose after pregnancy.  Zoster vaccine.** / 1 dose for adults aged 8 years or older.  Measles, mumps, rubella (MMR) vaccine.** / You need at least 1 dose of MMR if you were born in 1957 or later. You may also need a second dose. For females of childbearing age, rubella immunity should be determined. If there is no evidence of immunity, females who are not pregnant should be vaccinated. If there is no evidence of immunity, females who are pregnant should delay immunization until after pregnancy.  Pneumococcal 13-valent conjugate (PCV13) vaccine.** / Consult your health care provider.  Pneumococcal polysaccharide (PPSV23) vaccine.** / 1 to 2 doses if you smoke cigarettes or if you have certain conditions.  Meningococcal vaccine.** / Consult your health care provider.  Hepatitis A vaccine.** / Consult your health care provider.  Hepatitis B vaccine.** / Consult your health care provider.  Haemophilus influenzae type b (Hib) vaccine.** / Consult your health care provider. Ages 74 years and over  Blood pressure check.** / Every year.  Lipid and cholesterol check.** / Every 5 years beginning at age 57 years.  Lung cancer screening. / Every year if you are aged 110-80 years and have a 30-pack-year history of smoking and currently smoke or have quit within the past 15 years. Yearly screening is stopped once you have quit smoking for at least 15 years or develop a health problem that would prevent you from having lung cancer treatment.  Clinical breast exam.** / Every year after age 63 years.  BRCA-related cancer risk assessment.** / For  women who have family members with a BRCA-related cancer (breast, ovarian, tubal, or peritoneal cancers).  Mammogram.** / Every year beginning at age 43 years and continuing for as long as you are in good health. Consult with your health care provider.  Pap test.** / Every 3 years starting at age 62 years through age 73 or 31 years with 3 consecutive normal Pap tests. Testing can be stopped between 65 and 70 years with 3 consecutive normal Pap tests and no abnormal Pap or HPV tests in the past 10 years.  HPV screening.** / Every 3 years from ages 51 years through ages 4 or 50 years with a history of 3 consecutive normal Pap tests. Testing can be stopped between 65 and 70 years with 3 consecutive normal Pap tests and no abnormal Pap or HPV tests in the past 10 years.  Fecal occult blood test (FOBT) of stool. / Every year beginning at age 20 years and continuing until age 85 years. You may not need to do this test if you get a colonoscopy every 10 years.  Flexible sigmoidoscopy or colonoscopy.** / Every 5 years for a flexible sigmoidoscopy or every 10 years for a colonoscopy beginning  at age 41 years and continuing until age 15 years.  Hepatitis C blood test.** / For all people born from [birth year] through 1964 and any individual with known risks for hepatitis C.  Osteoporosis screening.** / A one-time screening for women ages 45 years and over and women at risk for fractures or osteoporosis.  Skin self-exam. / Monthly.  Influenza vaccine. / Every year.  Tetanus, diphtheria, and acellular pertussis (Tdap/Td) vaccine.** / 1 dose of Td every 10 years.  Varicella vaccine.** / Consult your health care provider.  Zoster vaccine.** / 1 dose for adults aged 60 years or older.  Pneumococcal 13-valent conjugate (PCV13) vaccine.** / Consult your health care provider.  Pneumococcal polysaccharide (PPSV23) vaccine.** / 1 dose for all adults aged 67 years and older.  Meningococcal vaccine.** / Consult your  health care provider.  Hepatitis A vaccine.** / Consult your health care provider.  Hepatitis B vaccine.** / Consult your health care provider.  Haemophilus influenzae type b (Hib) vaccine.** / Consult your health care provider. ** Family history and personal history of risk and conditions may change your health care provider's recommendations.   This information is not intended to replace advice given to you by your health care provider. Make sure you discuss any questions you have with your health care provider.   Document Released: 06/30/2001 Document Revised: 05/25/2014 Document Reviewed: 09/29/2010 Elsevier Interactive Patient Education [employer].

## 2015-03-22 NOTE — Telephone Encounter (Signed)
Labs and dx?  

## 2015-03-23 ENCOUNTER — Encounter: Payer: Self-pay | Admitting: Internal Medicine

## 2015-03-25 ENCOUNTER — Ambulatory Visit: Payer: Medicare Other

## 2015-03-25 ENCOUNTER — Encounter: Payer: Self-pay | Admitting: *Deleted

## 2015-03-26 LAB — CYTOLOGY - PAP

## 2015-03-28 ENCOUNTER — Ambulatory Visit (INDEPENDENT_AMBULATORY_CARE_PROVIDER_SITE_OTHER): Payer: Medicare Other | Admitting: Internal Medicine

## 2015-03-28 ENCOUNTER — Encounter: Payer: Self-pay | Admitting: Internal Medicine

## 2015-03-28 VITALS — BP 118/76 | HR 87 | Temp 98.2°F | Resp 14 | Ht 66.0 in | Wt 182.0 lb

## 2015-03-28 DIAGNOSIS — E119 Type 2 diabetes mellitus without complications: Secondary | ICD-10-CM | POA: Diagnosis not present

## 2015-03-28 DIAGNOSIS — M4806 Spinal stenosis, lumbar region: Secondary | ICD-10-CM

## 2015-03-28 DIAGNOSIS — I10 Essential (primary) hypertension: Secondary | ICD-10-CM

## 2015-03-28 DIAGNOSIS — E785 Hyperlipidemia, unspecified: Secondary | ICD-10-CM

## 2015-03-28 DIAGNOSIS — M48062 Spinal stenosis, lumbar region with neurogenic claudication: Secondary | ICD-10-CM | POA: Insufficient documentation

## 2015-03-28 DIAGNOSIS — Z79899 Other long term (current) drug therapy: Secondary | ICD-10-CM

## 2015-03-28 NOTE — Progress Notes (Signed)
Subjective:  Patient ID: [name], female    DOB: 12/03/1945  Age: 69 y.o. MRN: [id number]  CC: The primary encounter diagnosis was Spinal stenosis, lumbar region, with neurogenic claudication. Diagnoses of Diabetes mellitus with no complication ([place]), Hyperlipidemia, Essential hypertension, and Long-term use of high-risk medication were also pertinent to this visit.  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  She is currently managing her DM with  5 mg glipizide only at lunch and metformin twice daily.  She has taken no insulin in over 4 weeks. .  She has regained control of her DM based on submitted logs,  But has had several symptomatic hypoglycemic events which typically occur at  4 pm   Bilateral posterior thigh pain that is aggravated by sitting.  She has a history of spinal stenosis recurring surgical intervention with  lumbar spine surgery with rods done in  1995   .Marland Kitchen   Outpatient Prescriptions Prior to Visit  Medication Sig Dispense Refill  . aspirin 81 MG tablet Take 1 tablet (81 mg total) by mouth daily. 30 tablet 2  . atorvastatin (LIPITOR) 40 MG tablet Take 1 tablet (40 mg total) by mouth daily at 6 PM. (Patient taking differently: Take 20 mg by mouth daily at 6 PM. ) 90 tablet 1  . blood glucose meter kit and supplies KIT Dispense based on patient and insurance preference. Use up to four times daily as directed. (FOR ICD-9 250.00, 250.01). 1 each 0  . calcium-vitamin D (OSCAL WITH D) 250-125 MG-UNIT per tablet Take 2 tablets by mouth daily.    . famotidine (PEPCID) 40 MG tablet Take 1 tablet (40 mg total) by mouth daily. 90 tablet 1  . fish oil-omega-3 fatty acids 1000 MG capsule Take 2 g by mouth 2 (two) times daily.      . folic acid (FOLVITE) 1 MG tablet Take 1 tablet by mouth daily.    Marland Kitchen glipiZIDE (GLUCOTROL) 10 MG tablet 10 mg in Am and 5 mg in PM before meals 135 tablet 1  . glucose blood  (ACCU-CHEK AVIVA PLUS) test strip test CBG three times daily,  E11.65 300 each 3  . HYDROcodone-acetaminophen (NORCO/VICODIN) 5-325 MG per tablet Take 1 tablet by mouth every 6 (six) hours as needed for moderate pain. 60 tablet 0  . insulin aspart protamine- aspart (NOVOLOG MIX 70/30) (70-30) 100 UNIT/ML injection Inject 15 units at breakfast , None at lunch, and 10 units at dinner. 10 mL 11  . Insulin Syringes, Disposable, U-100 1 ML MISC Use to inject insulin Twice daily 100 each 3  . losartan (COZAAR) 50 MG tablet TAKE 1 TABLET (50 MG TOTAL) BY MOUTH DAILY. 90 tablet 1  . metFORMIN (GLUCOPHAGE) 500 MG tablet TAKE 1 TABLET (500 MG TOTAL) BY MOUTH 2 (TWO) TIMES DAILY WITH A MEAL. 180 tablet 3  . methotrexate (RHEUMATREX) 2.5 MG tablet Take by mouth. Take 1 by mouth every 7 days. 8 pills once a week with a meal.    . Methotrexate, Anti-Rheumatic, (METHOTREXATE, PF, [place]) Inject into the skin.    . Multiple Vitamin (MULTIVITAMIN) tablet Take 1 tablet by mouth daily.    . predniSONE (DELTASONE) 5 MG tablet Take 2.5 mg by mouth daily with breakfast.     . vitamin C (ASCORBIC ACID) 500 MG tablet Take 500 mg by mouth daily.    . vitamin E 100 UNIT capsule  Take 100 Units by mouth daily.     No facility-administered medications prior to visit.    Review of Systems;  Patient denies headache, fevers, malaise, unintentional weight loss, skin rash, eye pain, sinus congestion and sinus pain, sore throat, dysphagia,  hemoptysis , cough, dyspnea, wheezing, chest pain, palpitations, orthopnea, edema, abdominal pain, nausea, melena, diarrhea, constipation, flank pain, dysuria, hematuria, urinary  Frequency, nocturia, numbness, tingling, seizures,  Focal weakness, Loss of consciousness,  Tremor, insomnia, depression, anxiety, and suicidal ideation.      Objective:  BP 118/76 mmHg  Pulse 87  Temp(Src) 98.2 F (36.8 C) (Oral)  Resp 14  Ht 5' 6"  (1.676 m)  Wt 182 lb (82.555 kg)  BMI 29.39 kg/m2  SpO2  97%  BP Readings from Last 3 Encounters:  03/28/15 118/76  03/22/15 119/77  12/24/14 128/72    Wt Readings from Last 3 Encounters:  03/28/15 182 lb (82.555 kg)  03/22/15 179 lb (81.194 kg)  12/24/14 178 lb 8 oz (80.967 kg)    General appearance: alert, cooperative and appears stated age Ears: normal TM's and external ear canals both ears Throat: lips, mucosa, and tongue normal; teeth and gums normal Neck: no adenopathy, no carotid bruit, supple, symmetrical, trachea midline and thyroid not enlarged, symmetric, no tenderness/mass/nodules Back: symmetric, no curvature. ROM normal. No CVA tenderness. Lungs: clear to auscultation bilaterally Heart: regular rate and rhythm, S1, S2 normal, no murmur, click, rub or gallop Abdomen: soft, non-tender; bowel sounds normal; no masses,  no organomegaly Pulses: 2+ and symmetric Skin: Skin color, texture, turgor normal. No rashes or lesions Lymph nodes: Cervical, supraclavicular, and axillary nodes normal.  Lab Results  Component Value Date   HGBA1C 7.2* 03/22/2015   HGBA1C 8.3* 12/21/2014   HGBA1C 8.5* 09/17/2014    Lab Results  Component Value Date   CREATININE 0.71 03/22/2015   CREATININE 0.66 12/21/2014   CREATININE 0.73 09/17/2014    Lab Results  Component Value Date   WBC 6.6 03/22/2015   HGB 12.7 03/22/2015   HCT 39.9 03/22/2015   PLT 326.0 03/22/2015   GLUCOSE 111* 03/22/2015   CHOL 122 03/22/2015   TRIG 70.0 03/22/2015   HDL 53.70 03/22/2015   LDLDIRECT 60.0 03/22/2015   LDLCALC 54 03/22/2015   ALT 15 03/22/2015   AST 21 03/22/2015   NA 141 03/22/2015   K 4.2 03/22/2015   CL 102 03/22/2015   CREATININE 0.71 03/22/2015   BUN 16 03/22/2015   CO2 29 03/22/2015   TSH 2.12 05/14/2014   HGBA1C 7.2* 03/22/2015   MICROALBUR 0.8 12/21/2014    No results found.  Assessment & Plan:   Problem List Items Addressed This Visit    Hyperlipidemia    LDL and triglycerides are at goal on current medications. sHe has no  side effects and liver enzymes are normal. No changes today.  Lab Results  Component Value Date   CHOL 122 03/22/2015   HDL 53.70 03/22/2015   LDLCALC 54 03/22/2015   LDLDIRECT 60.0 03/22/2015   TRIG 70.0 03/22/2015   CHOLHDL 2 03/22/2015   Lab Results  Component Value Date   ALT 15 03/22/2015   AST 21 03/22/2015   ALKPHOS 72 03/22/2015   BILITOT 0.8 03/22/2015            Diabetes mellitus with no complication (H[facility])    Now on oral meds only. Once daily short acting glipizide at lunch is causing recurrent hypoglycemia. May resume glipizide in the morning only and stop completely  if hypoglycemia recurs. Conitnue statin,  ASA,  An ARB  Lab Results  Component Value Date   HGBA1C 7.2* 03/22/2015   Lab Results  Component Value Date   MICROALBUR 0.8 12/21/2014           Hypertension    Well controlled on current regimen. Renal function stable, no changes today.  Lab Results  Component Value Date   CREATININE 0.71 03/22/2015   Lab Results  Component Value Date   NA 141 03/22/2015   K 4.2 03/22/2015   CL 102 03/22/2015   CO2 29 03/22/2015           Long-term use of high-risk medication    Risks of MTX and prednisone reviewed.  Baseline DEXA scan was normal.  LFTS and CBC are monitored every 6 to 8 weeks  Lab Results  Component Value Date   WBC 6.6 03/22/2015   HGB 12.7 03/22/2015   HCT 39.9 03/22/2015   MCV 89.0 03/22/2015   PLT 326.0 03/22/2015   Lab Results  Component Value Date   ALT 15 03/22/2015   AST 21 03/22/2015   ALKPHOS 72 03/22/2015   BILITOT 0.8 03/22/2015           Spinal stenosis, lumbar region, with neurogenic claudication - Primary    Symptoms of bilateral posterior thigh pain suggest recurrence despite prior fusion /decompression surgery.  Ordered lumbar films to check alignment and positioning of  hardware,  And PT referral.       Relevant Orders   DG Lumbar Spine Complete   Ambulatory referral to Physical Therapy      A total of 25 minutes of face to face time was spent with patient more than half of which was spent in counselling and coordination of care  I am having Ms. Defreitas maintain her fish oil-omega-3 fatty acids, vitamin C, vitamin E, multivitamin, aspirin, calcium-vitamin D, Insulin Syringes (Disposable), predniSONE, blood glucose meter kit and supplies, methotrexate, folic acid, HYDROcodone-acetaminophen, atorvastatin, glipiZIDE, losartan, insulin aspart protamine- aspart, (Methotrexate, Anti-Rheumatic, (METHOTREXATE, PF, [place])), glucose blood, famotidine, and metFORMIN.  No orders of the defined types were placed in this encounter.    There are no discontinued medications.  Follow-up: Return in about 3 months (around 06/28/2015) for follow up diabetes.   Crecencio Mc, MD

## 2015-03-28 NOTE — Patient Instructions (Addendum)
Your diabetes remains under excellent control currently. .Please return in 3 months for follow up   Since you have been having low blood sugars before dinner:   Take 5 mg glipizide at breakfast only.    take your metformin at lunch and dinner   Call if you continue to have BS < 80    Watch "A Dog Named Christmas"  One of the sweetest movies for Christmas I have evern seen!

## 2015-03-28 NOTE — Progress Notes (Signed)
Pre visit review using our clinic review tool, if applicable. No additional management support is needed unless otherwise documented below in the visit note. 

## 2015-03-31 ENCOUNTER — Encounter: Payer: Self-pay | Admitting: Internal Medicine

## 2015-03-31 NOTE — Assessment & Plan Note (Signed)
Risks of MTX and prednisone reviewed.  Baseline DEXA scan was normal.  LFTS and CBC are monitored every 6 to 8 weeks  Lab Results  Component Value Date   WBC 6.6 03/22/2015   HGB 12.7 03/22/2015   HCT 39.9 03/22/2015   MCV 89.0 03/22/2015   PLT 326.0 03/22/2015   Lab Results  Component Value Date   ALT 15 03/22/2015   AST 21 03/22/2015   ALKPHOS 72 03/22/2015   BILITOT 0.8 03/22/2015

## 2015-03-31 NOTE — Assessment & Plan Note (Addendum)
Symptoms of bilateral posterior thigh pain suggest recurrence despite prior fusion /decompression surgery.  Ordered lumbar films to check alignment and positioning of  hardware,  And PT referral.

## 2015-03-31 NOTE — Assessment & Plan Note (Signed)
Now on oral meds only. Once daily short acting glipizide at lunch is causing recurrent hypoglycemia. May resume glipizide in the morning only and stop completely if hypoglycemia recurs. Conitnue statin,  ASA,  An ARB  Lab Results  Component Value Date   HGBA1C 7.2* 03/22/2015   Lab Results  Component Value Date   MICROALBUR 0.8 12/21/2014

## 2015-03-31 NOTE — Assessment & Plan Note (Signed)
Well controlled on current regimen. Renal function stable, no changes today.  Lab Results  Component Value Date   CREATININE 0.71 03/22/2015   Lab Results  Component Value Date   NA 141 03/22/2015   K 4.2 03/22/2015   CL 102 03/22/2015   CO2 29 03/22/2015

## 2015-03-31 NOTE — Assessment & Plan Note (Signed)
LDL and triglycerides are at goal on current medications. sHe has no side effects and liver enzymes are normal. No changes today.  Lab Results  Component Value Date   CHOL 122 03/22/2015   HDL 53.70 03/22/2015   LDLCALC 54 03/22/2015   LDLDIRECT 60.0 03/22/2015   TRIG 70.0 03/22/2015   CHOLHDL 2 03/22/2015   Lab Results  Component Value Date   ALT 15 03/22/2015   AST 21 03/22/2015   ALKPHOS 72 03/22/2015   BILITOT 0.8 03/22/2015

## 2015-04-01 ENCOUNTER — Ambulatory Visit
Admission: RE | Admit: 2015-04-01 | Discharge: 2015-04-01 | Disposition: A | Discharge status: Home / Self Care | Payer: Medicare Other | Source: Ambulatory Visit | Attending: Internal Medicine | Admitting: Internal Medicine

## 2015-04-01 ENCOUNTER — Ambulatory Visit: Payer: Medicare Other | Attending: Internal Medicine

## 2015-04-01 DIAGNOSIS — M48062 Spinal stenosis, lumbar region with neurogenic claudication: Secondary | ICD-10-CM

## 2015-04-01 DIAGNOSIS — M4806 Spinal stenosis, lumbar region: Secondary | ICD-10-CM | POA: Diagnosis not present

## 2015-04-01 DIAGNOSIS — R531 Weakness: Secondary | ICD-10-CM | POA: Diagnosis present

## 2015-04-01 DIAGNOSIS — M5431 Sciatica, right side: Secondary | ICD-10-CM | POA: Diagnosis present

## 2015-04-01 DIAGNOSIS — M25559 Pain in unspecified hip: Secondary | ICD-10-CM

## 2015-04-01 DIAGNOSIS — M5432 Sciatica, left side: Secondary | ICD-10-CM | POA: Insufficient documentation

## 2015-04-01 DIAGNOSIS — Z981 Arthrodesis status: Secondary | ICD-10-CM | POA: Insufficient documentation

## 2015-04-01 NOTE — Therapy (Signed)
[place] [facility] [address], [place], [address] Phone: [phone]   Fax:  [phone]  Physical Therapy Evaluation  Patient Details  Name: [name] MRN: [id number] Date of Birth: March 14, 1946 Referring Provider: Deborra Medina, MD  Encounter Date: 04/01/2015      PT End of Session - 04/01/15 1125    Visit Number 1   Number of Visits 9   Date for PT Re-Evaluation 05/02/15   Authorization Type 1   Authorization Time Period of 10   PT Start Time 1025   PT Stop Time 1125   PT Time Calculation (min) 60 min   Activity Tolerance Patient tolerated treatment well   Behavior During Therapy [facility] for tasks assessed/performed      Past Medical History  Diagnosis Date  . Hyperlipidemia     Lipitor trial was a failure  . Hypertension   . Diabetes mellitus   . High cholesterol   . Gastritis     Past Surgical History  Procedure Laterality Date  . Spine surgery  1995  . Tubal ligation    . Spine surgery      bone graft   . Cardiac catheterization  Augus 2007    normal coronaries,  EF 60%    There were no vitals filed for this visit.  Visit Diagnosis:  Spinal stenosis, lumbar region, with neurogenic claudication - Plan: PT plan of care cert/re-cert  Weakness - Plan: PT plan of care cert/re-cert  Hip pain, unspecified laterality - Plan: PT plan of care cert/re-cert      Subjective Assessment - 04/01/15 1026    Subjective Patient states having polymyalgia rheumatica. Taking prednisone (low dose) which is no longer helping. Also takes methotrexate once a week but pt does not know if it helps. Back pain current: 2/10, best 0/10, worst 8/10   Pertinent History Pt states that her pain started at her hips bilaterally. When she gets up and starts walking, her symptoms improve. Symptoms began 6 months ago when pt was sitting. Pt states that her doctors think that her pain is coming from her back. Had back fusion  surgery  L2 to S1 21 years ago.   Standing increases her back pain such as well as  when she cooks. 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 Back pain is much better after her surgery 21 years ago.    Diagnostic tests Has not yet had imaging.    Patient Stated Goals "Just be able to sit without having to get up and walk."   Currently in Pain? Yes   Pain Score 2   L hip pain   Pain Descriptors / Indicators Burning  low back and bilateral hip   Pain Type Chronic pain   Pain Onset More than a month ago   Aggravating Factors  sitting for prolonged periods on a firm chair, prolonged standing (15-20 min)   Pain Relieving Factors Moving around while standing, sitting on a sofa.    Multiple Pain Sites Yes  low back and bilateral hip     Objectives:  There-ex Directed patient with supine L LE single knee to chest 10x 2-3 second holds Seated bilateral ankle DF/PF 10x3  Improved exercise technique, movement at target joints, use of target muscles after mod verbal, visual, tactile cues.         [facility] PT Assessment - 04/01/15 1043  Assessment   Medical Diagnosis Spinal stenosis, lumbar region with neurogenic claudication   Referring Provider Deborra Medina, MD   Onset Date/Surgical Date 03/28/15   Prior Therapy No known prior physical therapy for current condition   Precautions   Precaution Comments No known precautions   Restrictions   Other Position/Activity Restrictions No known restrictions   Balance Screen   Has the patient fallen in the past 6 months No   Has the patient had a decrease in activity level because of a fear of falling?  No   Is the patient reluctant to leave their home because of a fear of falling?  No   Prior Function   Vocation Retired   [employer] PLOF: better able to tolerate sitting and standing   Observation/Other Assessments   Observations Slump test reveals neural tension bilateral  sciatic nerve. (-) Piriformis test bilaterally   Modified [place] 46%    Posture/Postural Control   Posture Comments Bilateral foot pronation, kyphosis, bilaterally protracted shoulders, decreased lordosis, slight R lateral shift    AROM   Lumbar Flexion limited with reproduction of posterior hip pain bilaterally.    Lumbar Extension limited with low back pain   Lumbar - Right Side Bend limited with R thoracolumbar pain   Lumbar - Left Side Bend limited without pain (decreased R thoracolumbar pain)   Lumbar - Right Rotation WFL   Lumbar - Left Rotation [facility]   Strength   Right Hip Flexion 4/5  with R lateral hip pain   Right Hip Extension 4/5   Right Hip External Rotation  4/5  with R lateral hip pain   Right Hip ABduction 4/5   Left Hip Flexion 4/5  with L lateral hip pain   Left Hip Extension 4/5   Left Hip External Rotation 4/5   Left Hip ABduction 4/5   Right Knee Flexion 5/5   Right Knee Extension 5/5   Left Knee Flexion 5/5  with reproduction of L posterior hip pain   Left Knee Extension 5/5   Palpation   Palpation comment Slight mid thoracic stiffness. No TTP posterior hip bilaterally. No TTP lateral hip bilaterally.    Ambulation/Gait   Gait Comments decreased stance L LE with R pelvic drop                                PT Long Term Goals - 04/01/15 2010    PT LONG TERM GOAL #1   Title Patient will have a decrease in bilateral hip pain to 5/10 or less at worst to improve ability to tolerate sitting and standing   Time 4   Period Weeks   Status New   PT LONG TERM GOAL #2   Title Patient will improve bilateral hip strength by 1/2 MMT grade to promote ability to perform standing tasks such as cooking with less hip symptoms   Time 4   Period Weeks   Status New   PT LONG TERM GOAL #3   Title Patient will improve her Modified Oswestry Low Back Pain Disability Questionnaire by at least 6 points as a demonstration of improved function.    Time 4    Period Weeks   Status New               Plan - 04/01/15 2005    Clinical Impression Statement Patient is a 69 year old female who came to physical therapy secondary to bilateral  hip pain and low back pain. She also presents with limited lumbar AROM, bilateral LE neural tension, bilateral hip weakness, altered gait pattern and posture, and difficulty tolerating positions such as sitting and standing. Patient will benefit from skilled physical therapy services to address the aforementioned deficits.    Pt will benefit from skilled therapeutic intervention in order to improve on the following deficits Pain;Decreased strength;Postural dysfunction   Rehab Potential Good   Clinical Impairments Affecting Rehab Potential age, chronicity of condition   PT Frequency 2x / week   PT Duration 4 weeks   PT Treatment/Interventions Therapeutic exercise;Manual techniques;Therapeutic activities;Electrical Stimulation;Patient/family education;Neuromuscular re-education;Ultrasound   PT Next Visit Plan posture, thoracic extension, hip strengthening, neural mobility   Consulted and Agree with Plan of Care Patient          G-Codes - 04/16/2015 09-02-2014    Functional Assessment Tool Used Modified Oswestry Low Back Pain Disability Questionnaire, clinical presentation, patient interview.    Functional Limitation Changing and maintaining body position   Changing and Maintaining Body Position Current Status [phone]) At least 40 percent but less than 60 percent impaired, limited or restricted   Changing and Maintaining Body Position Goal Status [id number]) At least 20 percent but less than 40 percent impaired, limited or restricted       Problem List Patient Active Problem List   Diagnosis Date Noted  . Spinal stenosis, lumbar region, with neurogenic claudication 03/28/2015  . Long-term use of high-risk medication 09/22/2014  . Screening for osteoporosis 07/03/2014  . Rheumatoid arthritis ([place]) 05/28/2014  .  Special screening for malignant neoplasms, colon 01/11/2014  . Normal pap smear, positive high risk HPV in 02/28/14 and 03/08/14 02/28/2013  . Gastritis   . Fatty liver disease, nonalcoholic [id number]  . Chest pain at rest 07/07/2011  . Iron deficiency anemia 02/11/2011  . Diabetes mellitus with no complication ([place]) [id number]  . Hypertension 02/11/2011  . Hyperlipidemia    Thank you for your referral.  Joneen Boers PT, DPT   April 16, 2015, 8:29 PM  [place] [facility] [address], [place], [address] Phone: [phone]   Fax:  [phone]  Name: NIKKOLE GILSTER MRN: [id number] Date of Birth: Aug 06, 1945

## 2015-04-01 NOTE — Patient Instructions (Signed)
   Sitting on chair, point toes up, then down.  Perform 3 sets of 10 daily.

## 2015-04-02 ENCOUNTER — Encounter: Payer: Self-pay | Admitting: Internal Medicine

## 2015-04-03 ENCOUNTER — Ambulatory Visit: Payer: Medicare Other

## 2015-04-03 ENCOUNTER — Telehealth: Payer: Self-pay | Admitting: *Deleted

## 2015-04-03 DIAGNOSIS — R531 Weakness: Secondary | ICD-10-CM

## 2015-04-03 DIAGNOSIS — M4806 Spinal stenosis, lumbar region: Secondary | ICD-10-CM | POA: Diagnosis not present

## 2015-04-03 DIAGNOSIS — M48062 Spinal stenosis, lumbar region with neurogenic claudication: Secondary | ICD-10-CM

## 2015-04-03 DIAGNOSIS — M25559 Pain in unspecified hip: Secondary | ICD-10-CM

## 2015-04-03 NOTE — Telephone Encounter (Signed)
-----   Message from Osborne Oman, MD sent at 04/03/2015  9:09 AM EST ----- Given persistence of high risk HPV in the past three years, even with negative cytology and negative HPV 16, 18/45; will proceed with colposcopy for further evaluation.  Colposcopy needs to be scheduled. Please call to inform patient of results and recommendations.

## 2015-04-03 NOTE — Patient Instructions (Signed)
  Quadriceps Set (Prone)   With toes supporting lower legs, squeeze rear end muscles and tighten thigh muscles to straighten knees. Hold _5___ seconds. Relax. Repeat __10__ times per set. Do ___3_ sets per session. Do ___1_ sessions per day.  http://orth.exer.us/726   Copyright  VHI. All rights reserved.   Bridge   Lie on back, legs bent. Lift hips up. Repeat __10__ times. Do __3__ sessions per day.  Copyright  VHI. All rights reserved.

## 2015-04-03 NOTE — Telephone Encounter (Signed)
Informed pt of results, scheduled Colpo for 04-30-15 @ 1030 with Dr Harolyn Rutherford.

## 2015-04-03 NOTE — Therapy (Signed)
[place] [facility] [address] [place], [address] Phone: [phone]   Fax:  [phone]  Physical Therapy Treatment  Patient Details  Name: [name] MRN: [id number] Date of Birth: 1945-08-13 Referring Provider: Deborra Medina, MD  Encounter Date: 04/03/2015      PT End of Session - 04/03/15 1624    Visit Number 2   Number of Visits 9   Date for PT Re-Evaluation 05/02/15   Authorization Type 2   Authorization Time Period of 10   PT Start Time 1623   PT Stop Time 1712   PT Time Calculation (min) 49 min   Activity Tolerance Patient tolerated treatment well   Behavior During Therapy [facility] for tasks assessed/performed      Past Medical History  Diagnosis Date  . Hyperlipidemia     Lipitor trial was a failure  . Hypertension   . Diabetes mellitus   . High cholesterol   . Gastritis     Past Surgical History  Procedure Laterality Date  . Spine surgery  1995  . Tubal ligation    . Spine surgery      bone graft   . Cardiac catheterization  Augus 2007    normal coronaries,  EF 60%    There were no vitals filed for this visit.  Visit Diagnosis:  Spinal stenosis, lumbar region, with neurogenic claudication  Weakness  Hip pain, unspecified laterality      Subjective Assessment - 04/03/15 1625    Subjective Pt states that the night before, her R hip pain woke her up. Sleeps on her side. MD increased prednisone (3 x 5 mg tablets)  which helped her PMR. Shoulders feel better. R hip pain bothered her during bible study sitting today.    Pertinent History Pt states that her pain started at her hips bilaterally. When she gets up and starts walking, her symptoms improve. Symptoms began 6 months ago when pt was sitting. Pt states that her doctors think that her pain is coming from her back. Had back fusion surgery  L2 to S1 21 years ago.   Standing increases her back pain such as well as  when she cooks. 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 Back pain is much better after her surgery 21 years ago.    Diagnostic tests Has not yet had imaging.    Patient Stated Goals "Just be able to sit without having to get up and walk."   Currently in Pain? No/denies   Pain Onset More than a month ago   Multiple Pain Sites Yes  back and bilateral hips      Objectives  There-ex  Directed patient with supine bridge 5x2, then 10x2  Supine bilateral scap retract 10x2 with 5 second holds  Prone glute max/quad set 10x3 with 5 second holds each LE  Standing bilateral scapular retraction using latex free yellow band 6x5 second holds Then sitting 4x5 second holds, and 2x10 with 5 second holds,  Forward wedding march 32 ft x 4 holding onto 5 lbs dumbbell each hand  Side stepping holding onto 5 lb dumbbell 32 ft each direction 2x  Standing ankle DF/PF with bilateral UE assist x 2 min for neural mobility bilateral LE.     Improved exercise technique, movement at target joints, use of target muscles after mod verbal, visual, tactile cues.  Low back discomfort felt initially with supine bridge exercise but disappeared with increased repetition. Good glute med and max muscle use felt with side stepping and wedding march exercises. Pt states feeling good after session.                        PT Education - 04/03/15 1634    Education provided Yes   Education Details ther-ex, HEP   Person(s) Educated Patient   Methods Explanation;Demonstration;Tactile cues;Verbal cues;Handout   Comprehension Verbalized understanding;Returned demonstration             PT Long Term Goals - 04/01/15 2010    PT LONG TERM GOAL #1   Title Patient will have a decrease in bilateral hip pain to 5/10 or less at worst to improve ability to tolerate sitting and standing   Time 4   Period Weeks   Status New   PT LONG TERM GOAL #2    Title Patient will improve bilateral hip strength by 1/2 MMT grade to promote ability to perform standing tasks such as cooking with less hip symptoms   Time 4   Period Weeks   Status New   PT LONG TERM GOAL #3   Title Patient will improve her Modified Oswestry Low Back Pain Disability Questionnaire by at least 6 points as a demonstration of improved function.    Time 4   Period Weeks   Status New               Plan - 04/03/15 1635    Clinical Impression Statement Low back discomfort felt initially with supine bridge exercise but disappeared with increased repetition. Good glute med and max muscle use felt with side stepping and wedding march exercises. Pt states feeling good after session.    Pt will benefit from skilled therapeutic intervention in order to improve on the following deficits Pain;Decreased strength;Postural dysfunction   Rehab Potential Good   Clinical Impairments Affecting Rehab Potential age, chronicity of condition   PT Frequency 2x / week   PT Duration 4 weeks   PT Treatment/Interventions Therapeutic exercise;Manual techniques;Therapeutic activities;Electrical Stimulation;Patient/family education;Neuromuscular re-education;Ultrasound   PT Next Visit Plan posture, thoracic extension, hip strengthening, neural mobility   Consulted and Agree with Plan of Care Patient        Problem List Patient Active Problem List   Diagnosis Date Noted  . Spinal stenosis, lumbar region, with neurogenic claudication 03/28/2015  . Long-term use of high-risk medication 09/22/2014  . Screening for osteoporosis 07/03/2014  . Rheumatoid arthritis ([place]) 05/28/2014  . Special screening for malignant neoplasms, colon 01/11/2014  . Normal cytology on pap smears, but positive high risk HPV in 02/28/13, 03/08/14 and 03/22/15 02/28/2013  . Gastritis   . Fatty liver disease, nonalcoholic [id number]  . Chest pain at rest 07/07/2011  . Iron deficiency anemia 02/11/2011  . Diabetes  mellitus with no complication ([place]) [id number]  . Hypertension 02/11/2011  . Hyperlipidemia     Joneen Boers PT, DPT   04/03/2015, 5:29 PM  [place] [facility] [address], [place], [address] Phone: [phone]   Fax:  [phone]  Name: RHODIA DEPASS MRN: [id number] Date of Birth: [birth year]

## 2015-04-09 ENCOUNTER — Ambulatory Visit: Payer: Medicare Other

## 2015-04-09 DIAGNOSIS — M4806 Spinal stenosis, lumbar region: Secondary | ICD-10-CM | POA: Diagnosis not present

## 2015-04-09 DIAGNOSIS — M25559 Pain in unspecified hip: Secondary | ICD-10-CM

## 2015-04-09 DIAGNOSIS — R531 Weakness: Secondary | ICD-10-CM

## 2015-04-09 DIAGNOSIS — M48062 Spinal stenosis, lumbar region with neurogenic claudication: Secondary | ICD-10-CM

## 2015-04-09 NOTE — Patient Instructions (Signed)
(  Home) Extension: Thoracic With Lumbar Lock - Sitting    Sit with back against chair, knees bent, hands folded across (not shown). Extend trunk over chair back. Hold position for __5__ seconds. Repeat ___10  times per set. Do _3___ sets per session daily.   Copyright  VHI. All rights reserved.     

## 2015-04-09 NOTE — Therapy (Signed)
[place] [facility] [address], [place], [address] Phone: [phone]   Fax:  [phone]  Physical Therapy Treatment  Patient Details  Name: [name] MRN: [id number] Date of Birth: [birth year] Referring Provider: Deborra Medina, MD  Encounter Date: 04/09/2015      PT End of Session - 04/09/15 1144    Visit Number 3   Number of Visits 9   Date for PT Re-Evaluation 05/02/15   Authorization Type 3   Authorization Time Period of 10   PT Start Time 1144   PT Stop Time 1215   PT Time Calculation (min) 31 min   Activity Tolerance Patient tolerated treatment well   Behavior During Therapy [facility] for tasks assessed/performed      Past Medical History  Diagnosis Date  . Hyperlipidemia     Lipitor trial was a failure  . Hypertension   . Diabetes mellitus   . High cholesterol   . Gastritis     Past Surgical History  Procedure Laterality Date  . Spine surgery  1995  . Tubal ligation    . Spine surgery      bone graft   . Cardiac catheterization  Augus 2007    normal coronaries,  EF 60%    There were no vitals filed for this visit.  Visit Diagnosis:  Spinal stenosis, lumbar region, with neurogenic claudication  Weakness  Hip pain, unspecified laterality      Subjective Assessment - 04/09/15 1144    Subjective Back is doing ok. Had to put ice in the middle of her back because of doing a lot of chores. Had to put her L hand under her hip because sitting was bothering her.  Also took more prednisone for her symptoms.    Pertinent History Pt states that her pain started at her hips bilaterally. When she gets up and starts walking, her symptoms improve. Symptoms began 6 months ago when pt was sitting. Pt states that her doctors think that her pain is coming from her back. Had back fusion surgery  L2 to S1 21 years ago.   Standing increases her back pain such as well as  when she cooks. 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 Back pain is much better after her surgery 21 years ago.    Diagnostic tests Has not yet had imaging.    Patient Stated Goals "Just be able to sit without having to get up and walk."   Currently in Pain? No/denies   Pain Onset More than a month ago   Multiple Pain Sites Yes  back and bilateral hips      There-ex  Directed patient with sitting bilateral scapular retraction resisting latex free yellow band 10x2 with 5 second holds Seated knee extension 10x2 each LE to promote neural flossing Seated thoracic extension 10x 5 second holds for 3 sets,  Standing ankle DF/PF with bilateral UE assist 10x3 each direction Forward wedding march 32 ft x 4 holding onto 5 lbs dumbbell each hand Supine bridge 10x2   Improved exercise technique, movement at target joints, use of target muscles after mod verbal, visual, tactile cues.    Tolerated session without aggravation of symptoms. Thoracic extension and glute muscle use felt with exercises. Tolerated session without aggravation of symptoms.  PT Education - 04/09/15 1151    Education provided Yes   Education Details ther-ex   [employer]) Educated Patient   Methods Explanation;Demonstration;Tactile cues;Verbal cues   Comprehension Verbalized understanding;Returned demonstration             PT Long Term Goals - 04/01/15 2010    PT LONG TERM GOAL #1   Title Patient will have a decrease in bilateral hip pain to 5/10 or less at worst to improve ability to tolerate sitting and standing   Time 4   Period Weeks   Status New   PT LONG TERM GOAL #2   Title Patient will improve bilateral hip strength by 1/2 MMT grade to promote ability to perform standing tasks such as cooking with less hip symptoms   Time 4   Period Weeks   Status New   PT LONG TERM GOAL #3   Title Patient will improve her Modified Oswestry  Low Back Pain Disability Questionnaire by at least 6 points as a demonstration of improved function.    Time 4   Period Weeks   Status New               Plan - 04/09/15 1152    Clinical Impression Statement Tolerated session without aggravation of symptoms. Thoracic extension and glute muscle use felt with exercises. Back discomfort felt initially with supine bridge exercise which eased with repetition. Tolerated session without aggravation of symptoms.    Pt will benefit from skilled therapeutic intervention in order to improve on the following deficits Pain;Decreased strength;Postural dysfunction   Rehab Potential Good   Clinical Impairments Affecting Rehab Potential age, chronicity of condition   PT Frequency 2x / week   PT Duration 4 weeks   PT Treatment/Interventions Therapeutic exercise;Manual techniques;Therapeutic activities;Electrical Stimulation;Patient/family education;Neuromuscular re-education;Ultrasound   PT Next Visit Plan posture, thoracic extension, hip strengthening, neural mobility   Consulted and Agree with Plan of Care Patient        Problem List Patient Active Problem List   Diagnosis Date Noted  . Spinal stenosis, lumbar region, with neurogenic claudication 03/28/2015  . Long-term use of high-risk medication 09/22/2014  . Screening for osteoporosis 07/03/2014  . Rheumatoid arthritis ([place]) 05/28/2014  . Special screening for malignant neoplasms, colon 01/11/2014  . Normal cytology on pap smears, but positive high risk HPV in 02/28/13, 03/08/14 and 03/22/15 02/28/2013  . Gastritis   . Fatty liver disease, nonalcoholic [id number]  . Chest pain at rest 07/07/2011  . Iron deficiency anemia 02/11/2011  . Diabetes mellitus with no complication ([place]) [id number]  . Hypertension 02/11/2011  . Hyperlipidemia    Joneen Boers PT, DPT   04/09/2015, 12:29 PM  [place] [facility] [address], [place], [address] Phone: [phone]   Fax:  [phone]  Name: LESLIEANNE VOLNER MRN: [id number] Date of Birth: June 01, 1945

## 2015-04-15 ENCOUNTER — Ambulatory Visit: Payer: Medicare Other

## 2015-04-15 DIAGNOSIS — M48062 Spinal stenosis, lumbar region with neurogenic claudication: Secondary | ICD-10-CM

## 2015-04-15 DIAGNOSIS — M25559 Pain in unspecified hip: Secondary | ICD-10-CM

## 2015-04-15 DIAGNOSIS — R531 Weakness: Secondary | ICD-10-CM

## 2015-04-15 DIAGNOSIS — M4806 Spinal stenosis, lumbar region: Secondary | ICD-10-CM | POA: Diagnosis not present

## 2015-04-15 NOTE — Therapy (Signed)
[facility] [address] [place], [address] Phone: [phone]   Fax:  [phone]  Physical Therapy Treatment  Patient Details  Name: [name] MRN: [id number] Date of Birth: [birth year] Referring Provider: Deborra Medina, MD  Encounter Date: 04/15/2015      PT End of Session - 04/15/15 0843    Visit Number 4   Number of Visits 9   Date for PT Re-Evaluation 05/02/15   Authorization Type 4   Authorization Time Period of 10   PT Start Time 0843   PT Stop Time 0931   PT Time Calculation (min) 48 min   Activity Tolerance Patient tolerated treatment well   Behavior During Therapy [facility] for tasks assessed/performed      Past Medical History  Diagnosis Date  . Hyperlipidemia     Lipitor trial was a failure  . Hypertension   . Diabetes mellitus   . High cholesterol   . Gastritis     Past Surgical History  Procedure Laterality Date  . Spine surgery  1995  . Tubal ligation    . Spine surgery      bone graft   . Cardiac catheterization  Augus 2007    normal coronaries,  EF 60%    There were no vitals filed for this visit.  Visit Diagnosis:  Spinal stenosis, lumbar region, with neurogenic claudication  Weakness  Hip pain, unspecified laterality      Subjective Assessment - 04/15/15 0845    Subjective Pt states she made it sitting through Sunday school (yesterday) doing her exercises (seated ankle DF/PF for neural glides). No pain currently. Her back bothered her over the weekend. Also states that did a lot during the holiday including standing in long lines.    Pertinent History Pt states that her pain started at her hips bilaterally. When she gets up and starts walking, her symptoms improve. Symptoms began 6 months ago when pt was sitting. Pt states that her doctors think that her pain is coming from her back. Had back fusion surgery  L2 to S1 21 years ago.   Standing increases her back pain  such as well as  when she cooks. 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 Back pain is much better after her surgery 21 years ago.    Diagnostic tests Has not yet had imaging.    Patient Stated Goals "Just be able to sit without having to get up and walk."   Currently in Pain? No/denies   Pain Onset More than a month ago       There-ex  (+) Piriformis test in supine bilaterally L > R  Directed patient with supine R and L piriformis stretch 30 seconds x 3 each LE, Supine bridge 10x then 2x5 with hip adductor pillow squeeze Seated hip adductor pillow squeeze 10x5 seconds for 2 sets (reviewed and given as part of her HEP; pt demonstrated and verbalized understanding) Forward wedding march 32 ft x 4 holding onto 5 lbs dumbbell each hand Side stepping 32 ft x2 each direction holding onto 5 lbs dumbbell each hand Standing bilateral ankle DF/PF on rocker board x 2 min.  Standing bilateral scapular retraction resisting yellow latex free band 10x5 seconds for 2 sets.  Improved exercise technique, movement at target joints, use of target muscles after mod verbal, visual, tactile cues.     (+) Piriformis  test in supine bilateral LE with L > R with reproduction of posterior hip/proximal thigh symptoms. Performed exercise to promote piriformis mobility and glute strength. Tolerated session without complain of hip pain.                           PT Education - 04/15/15 0905    Education provided Yes   Education Details ther-ex, HEP   Person(s) Educated Patient   Methods Explanation;Demonstration;Tactile cues;Verbal cues;Handout   Comprehension Verbalized understanding;Returned demonstration             PT Long Term Goals - 04/01/15 2010    PT LONG TERM GOAL #1   Title Patient will have a decrease in bilateral hip pain to 5/10 or less at worst to improve ability to tolerate sitting and standing   Time 4    Period Weeks   Status New   PT LONG TERM GOAL #2   Title Patient will improve bilateral hip strength by 1/2 MMT grade to promote ability to perform standing tasks such as cooking with less hip symptoms   Time 4   Period Weeks   Status New   PT LONG TERM GOAL #3   Title Patient will improve her Modified Oswestry Low Back Pain Disability Questionnaire by at least 6 points as a demonstration of improved function.    Time 4   Period Weeks   Status New               Plan - 04/15/15 [id number]    Clinical Impression Statement (+) Piriformis test in supine bilateral LE  L > R with reproduction of posterior hip/proximal thigh symptoms. Performed exercise to promote piriformis mobility and glute strength. Tolerated session without complain of hip pain.    Pt will benefit from skilled therapeutic intervention in order to improve on the following deficits Pain;Decreased strength;Postural dysfunction   Rehab Potential Good   Clinical Impairments Affecting Rehab Potential age, chronicity of condition   PT Frequency 2x / week   PT Duration 4 weeks   PT Treatment/Interventions Therapeutic exercise;Manual techniques;Therapeutic activities;Electrical Stimulation;Patient/family education;Neuromuscular re-education;Ultrasound   PT Next Visit Plan posture, thoracic extension, hip strengthening, neural mobility   Consulted and Agree with Plan of Care Patient        Problem List Patient Active Problem List   Diagnosis Date Noted  . Spinal stenosis, lumbar region, with neurogenic claudication 03/28/2015  . Long-term use of high-risk medication 09/22/2014  . Screening for osteoporosis 07/03/2014  . Rheumatoid arthritis ([place]) 05/28/2014  . Special screening for malignant neoplasms, colon 01/11/2014  . Normal cytology on pap smears, but positive high risk HPV in 02/28/13, 03/08/14 and 03/22/15 02/28/2013  . Gastritis   . Fatty liver disease, nonalcoholic [id number]  . Chest pain at rest 07/07/2011   . Iron deficiency anemia 02/11/2011  . Diabetes mellitus with no complication ([place]) [id number]  . Hypertension 02/11/2011  . Hyperlipidemia     Joneen Boers PT, DPT   04/15/2015, 11:54 AM  [place] [facility] [address] [place], [address] Phone: [phone]   Fax:  [phone]  Name: [name] MRN: [id number] Date of Birth: Dec 25, 1945

## 2015-04-15 NOTE — Patient Instructions (Addendum)
Adduction: Hip - Knees Together (Sitting)   Sit with towel roll between knees. Push knees together. Hold for _5__ seconds. Repeat _10__ times. Do __3_ times a day.  Copyright  VHI. All rights reserved.   Scapular Retraction: Rowing (Eccentric) - Arms - 45 Degrees (Resistance Band)   Hold end of band in each hand. Pull back until elbows are even with trunk. Keep elbows out from sides at 45, thumbs up.  Hold for 5 seconds. Slowly release for 3-5 seconds. Use _____yellow___ resistance band. _10__ reps per set, _3__ sets per day. Copyright  VHI. All rights reserved.

## 2015-04-17 ENCOUNTER — Other Ambulatory Visit: Payer: Self-pay | Admitting: Internal Medicine

## 2015-04-17 ENCOUNTER — Other Ambulatory Visit: Payer: Self-pay

## 2015-04-17 ENCOUNTER — Ambulatory Visit: Payer: Medicare Other

## 2015-04-17 DIAGNOSIS — M4806 Spinal stenosis, lumbar region: Secondary | ICD-10-CM | POA: Diagnosis not present

## 2015-04-17 DIAGNOSIS — R531 Weakness: Secondary | ICD-10-CM

## 2015-04-17 DIAGNOSIS — M48062 Spinal stenosis, lumbar region with neurogenic claudication: Secondary | ICD-10-CM

## 2015-04-17 DIAGNOSIS — M25559 Pain in unspecified hip: Secondary | ICD-10-CM

## 2015-04-17 NOTE — Therapy (Signed)
[place] [facility] [address], [place], [address] Phone: [phone]   Fax:  [phone]  Physical Therapy Treatment  Patient Details  Name: [name] MRN: [id number] Date of Birth: 1945/10/30 Referring Provider: Deborra Medina, MD  Encounter Date: 04/17/2015      PT End of Session - 04/17/15 1436    Visit Number 5   Number of Visits 9   Date for PT Re-Evaluation 05/02/15   Authorization Type 5   Authorization Time Period of 10   PT Start Time [id number]   PT Stop Time 1526   PT Time Calculation (min) 49 min   Activity Tolerance Patient tolerated treatment well   Behavior During Therapy [facility] for tasks assessed/performed      Past Medical History  Diagnosis Date  . Hyperlipidemia     Lipitor trial was a failure  . Hypertension   . Diabetes mellitus   . High cholesterol   . Gastritis     Past Surgical History  Procedure Laterality Date  . Spine surgery  1995  . Tubal ligation    . Spine surgery      bone graft   . Cardiac catheterization  Augus 2007    normal coronaries,  EF 60%    There were no vitals filed for this visit.  Visit Diagnosis:  Spinal stenosis, lumbar region, with neurogenic claudication  Weakness  Hip pain, unspecified laterality      Subjective Assessment - 04/17/15 1436    Subjective Made it sitting through bible study doing the seated ankle exercises. Back bothered her earlier today sitting and doing things like Christmas cards. Went to [employer] on Monday, did some driving and L hip hurt her. Back pain  5/10 at most for the past week (standing for a long time).   Pertinent History Pt states that her pain started at her hips bilaterally. When she gets up and starts walking, her symptoms improve. Symptoms began 6 months ago when pt was sitting. Pt states that her doctors think that her pain is coming from her back. Had back fusion surgery  L2 to S1 21 years ago.   Standing  increases her back pain such as well as  when she cooks. 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 Back pain is much better after her surgery 21 years ago.    Diagnostic tests Has not yet had imaging.    Patient Stated Goals "Just be able to sit without having to get up and walk."   Currently in Pain? No/denies   Pain Score 0-No pain  No hip or back pain at the moment.    Pain Onset More than a month ago   Multiple Pain Sites Yes  low back, bilateral hips         Objectives:   There-ex   Directed patient with seated piriformis stretch R and L hip 30 seconds x 3 Seated hip adductor squeeze (hands) 10x2 with 5 second holds Seated bilateral scapular retraction 10x10 second holds for 2 sets Seated LE neural flossing (LAQ with trunk movement from hips) 10x2 each LE Supine bridge 5x4 with hip adductor pillow squeeze (to promote glute strength and decrease piriformis use) Supine posterior pelvic tilts 10x2 with 5 second holds Supine lower trunk rotation 10x2 each side  Improved exercise technique, movement at target joints, use of target muscles after mod verbal,  visual, tactile cues.    Tolerated session without increase in back or LE symptoms. Exercises designed to improve tolerance sitting (improve piriformis flexibility, thoracic extension, trunk mobility) secondary to symptoms reproduced with sitting for bible study, car rides, or when performing sitting tasks.                      PT Education - 04/17/15 1442    Education provided Yes   Education Details ther-ex, HEP   Person(s) Educated Patient   Methods Explanation;Demonstration;Tactile cues;Verbal cues;Handout   Comprehension Verbalized understanding;Returned demonstration             PT Long Term Goals - 04/01/15 2010    PT LONG TERM GOAL #1   Title Patient will have a decrease in bilateral hip pain to 5/10 or less at worst to improve  ability to tolerate sitting and standing   Time 4   Period Weeks   Status New   PT LONG TERM GOAL #2   Title Patient will improve bilateral hip strength by 1/2 MMT grade to promote ability to perform standing tasks such as cooking with less hip symptoms   Time 4   Period Weeks   Status New   PT LONG TERM GOAL #3   Title Patient will improve her Modified Oswestry Low Back Pain Disability Questionnaire by at least 6 points as a demonstration of improved function.    Time 4   Period Weeks   Status New               Plan - 04/17/15 1435    Clinical Impression Statement Tolerated session without increase in back or LE symptoms. Exercises designed to improve tolerance sitting (improve piriformis flexibility, thoracic extension, trunk mobility) secondary to symptoms reproduced with sitting for bible study, car rides, or when performing sitting tasks.    Pt will benefit from skilled therapeutic intervention in order to improve on the following deficits Pain;Decreased strength;Postural dysfunction   Rehab Potential Good   Clinical Impairments Affecting Rehab Potential age, chronicity of condition   PT Frequency 2x / week   PT Duration 4 weeks   PT Treatment/Interventions Therapeutic exercise;Manual techniques;Therapeutic activities;Electrical Stimulation;Patient/family education;Neuromuscular re-education;Ultrasound   PT Next Visit Plan posture, thoracic extension, hip strengthening, neural mobility   Consulted and Agree with Plan of Care Patient        Problem List Patient Active Problem List   Diagnosis Date Noted  . Spinal stenosis, lumbar region, with neurogenic claudication 03/28/2015  . Long-term use of high-risk medication 09/22/2014  . Screening for osteoporosis 07/03/2014  . Rheumatoid arthritis ([place]) 05/28/2014  . Special screening for malignant neoplasms, colon 01/11/2014  . 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 Gastritis   . Fatty liver disease, nonalcoholic [id number]  . Chest pain at rest 07/07/2011  . Iron deficiency anemia 02/11/2011  . Diabetes mellitus with no complication ([place]) [id number]  . Hypertension 02/11/2011  . Hyperlipidemia     Joneen Boers PT, DPT   04/17/2015, 6:51 PM  [place] [facility] [address] [place], [address] Phone: [phone]   Fax:  [phone]  Name: [name] MRN: [id number] Date of Birth: [birth year]

## 2015-04-17 NOTE — Patient Instructions (Signed)
Piriformis Stretch, Sitting    Sit, one ankle on opposite knee, same-side hand on crossed knee. Lean torso forward, with flat back, until tension is felt in hamstrings and gluteals of crossed-leg side. Hold _30__ seconds.  Repeat 3___ times per session. Do _2__ sessions per day.  Copyright  VHI. All rights reserved.

## 2015-04-19 NOTE — Telephone Encounter (Signed)
Mailed unread message to patient.  

## 2015-04-23 ENCOUNTER — Ambulatory Visit: Payer: Medicare Other

## 2015-04-25 ENCOUNTER — Ambulatory Visit: Payer: Medicare Other | Attending: Internal Medicine

## 2015-04-25 DIAGNOSIS — M4806 Spinal stenosis, lumbar region: Secondary | ICD-10-CM | POA: Diagnosis present

## 2015-04-25 DIAGNOSIS — R531 Weakness: Secondary | ICD-10-CM | POA: Diagnosis present

## 2015-04-25 DIAGNOSIS — M48062 Spinal stenosis, lumbar region with neurogenic claudication: Secondary | ICD-10-CM

## 2015-04-25 DIAGNOSIS — M25559 Pain in unspecified hip: Secondary | ICD-10-CM | POA: Diagnosis present

## 2015-04-25 NOTE — Therapy (Signed)
[place] [facility] [address] [place], [address] Phone: [phone]   Fax:  [phone]  Physical Therapy Treatment  Patient Details  Name: [name] MRN: [id number] Date of Birth: Aug 02, 1945 Referring Provider: Deborra Medina, MD  Encounter Date: 04/25/2015      PT End of Session - 04/25/15 1431    Visit Number 6   Number of Visits 9   Date for PT Re-Evaluation 05/02/15   Authorization Type 6   Authorization Time Period of 10   PT Start Time 1430   PT Stop Time 1517   PT Time Calculation (min) 47 min   Activity Tolerance Patient tolerated treatment well   Behavior During Therapy [facility] for tasks assessed/performed      Past Medical History  Diagnosis Date  . Hyperlipidemia     Lipitor trial was a failure  . Hypertension   . Diabetes mellitus   . High cholesterol   . Gastritis     Past Surgical History  Procedure Laterality Date  . Spine surgery  1995  . Tubal ligation    . Spine surgery      bone graft   . Cardiac catheterization  Augus 2007    normal coronaries,  EF 60%    There were no vitals filed for this visit.  Visit Diagnosis:  Spinal stenosis, lumbar region, with neurogenic claudication  Weakness  Hip pain, unspecified laterality      Subjective Assessment - 04/25/15 1432    Subjective Pt states had to drive 45 min to 1 hour a couple of days ago to [facility] and her hips were fine. Hips were ok during bible study (1.5 hours). No back or hip pain currenlty.  Pt states that Dr. Derrel Nip states that everything looks good in her back from her x-rays. Decreased prednisone medcation.    Pertinent History Pt states that her pain started at her hips bilaterally. When she gets up and starts walking, her symptoms improve. Symptoms began 6 months ago when pt was sitting. Pt states that her doctors think that her pain is coming from her back. Had back fusion surgery  L2 to S1 21 years ago.    Standing increases her back pain such as well as  when she cooks. 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 Back pain is much better after her surgery 21 years ago.    Diagnostic tests Has not yet had imaging.    Patient Stated Goals "Just be able to sit without having to get up and walk."   Currently in Pain? No/denies   Pain Onset More than a month ago         [facility] PT Assessment - 04/25/15 1448    Observation/Other Assessments   Modified Oswertry 22% (24 % improvement since initial evaluation)   Strength   Right Hip Flexion 4+/5   Right Hip Extension 4+/5   Right Hip External Rotation  4+/5   Right Hip ABduction 4+/5   Left Hip Flexion 4+/5   Left Hip Extension 4+/5   Left Hip External Rotation 4+/5   Left Hip ABduction 4+/5    1/10 bilateral hip, 3/10 back pain at most for the past 5 days   Objectives:   There-ex   Directed patient with seated piriformis stretch R and L hip 40 seconds x 3 Seated bilateral scapular retraction 10x10 second holds for  2 sets resisting red band Manually resisted seated hip ER, hip flexion, S/L hip abduction, prone glute max extension 1x each way for each LE Reviewed progress with hip strength with pt Seated LE neural flossing (LAQ with trunk movement from hips) 10x2 each LE Seated hip adductor squeeze (hands) 10x2 with 10 second holds Forward wedding march holding 6 lbs dumbbell each hand 32 ft x 4,  Side stepping holding onto 6 lbs dumbbell each hand 32 ft each direction (R and L)   Improved exercise technique, movement at target joints, use of target muscles after mod verbal, visual, tactile cues.    Patient has demonstrated overall decreased back and hip pain, improved bilateral hip strength and Modified Oswestry Low Back Pain Disability Questionnaire score (by 24 % suggesting improved function since initial evaluation) since initial evaluation. Patient making very good progress  towards decreased pain and function with physical therapy.                        PT Education - 04/25/15 1458    Education provided Yes   Education Details ther-ex, progress with LE strength   Person(s) Educated Patient   Methods Explanation;Demonstration;Tactile cues;Verbal cues   Comprehension Verbalized understanding;Returned demonstration             PT Long Term Goals - 04/01/15 2010    PT LONG TERM GOAL #1   Title Patient will have a decrease in bilateral hip pain to 5/10 or less at worst to improve ability to tolerate sitting and standing   Time 4   Period Weeks   Status New   PT LONG TERM GOAL #2   Title Patient will improve bilateral hip strength by 1/2 MMT grade to promote ability to perform standing tasks such as cooking with less hip symptoms   Time 4   Period Weeks   Status New   PT LONG TERM GOAL #3   Title Patient will improve her Modified Oswestry Low Back Pain Disability Questionnaire by at least 6 points as a demonstration of improved function.    Time 4   Period Weeks   Status New               Plan - 04/25/15 1430    Clinical Impression Statement Patient has demonstrated overall decreased back and hip pain, improved bilateral hip strength and Modified Oswestry Low Back Pain Disability Questionnaire score (by 24 % suggesting improved function since initial evaluation) since initial evaluation. Patient making very good progress towards decreased pain and function with physical therapy.    Pt will benefit from skilled therapeutic intervention in order to improve on the following deficits Pain;Decreased strength;Postural dysfunction   Rehab Potential Good   Clinical Impairments Affecting Rehab Potential age, chronicity of condition   PT Frequency 2x / week   PT Duration 4 weeks   PT Treatment/Interventions Therapeutic exercise;Manual techniques;Therapeutic activities;Electrical Stimulation;Patient/family education;Neuromuscular  re-education;Ultrasound   PT Next Visit Plan posture, thoracic extension, hip strengthening, neural mobility   Consulted and Agree with Plan of Care Patient        Problem List Patient Active Problem List   Diagnosis Date Noted  . Spinal stenosis, lumbar region, with neurogenic claudication 03/28/2015  . Long-term use of high-risk medication 09/22/2014  . Screening for osteoporosis 07/03/2014  . Rheumatoid arthritis ([place]) 05/28/2014  . Special screening for malignant neoplasms, colon 01/11/2014  . 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 Gastritis   . Fatty liver disease, nonalcoholic [id number]  . Chest pain at rest 07/07/2011  . Iron deficiency anemia 02/11/2011  . Diabetes mellitus with no complication ([place]) [id number]  . Hypertension 02/11/2011  . Hyperlipidemia    Joneen Boers PT, DPT   04/25/2015, 3:33 PM  [place] [facility] [address], [place], [address] Phone: [phone]   Fax:  [phone]  Name: SHANETHA MENDELL MRN: [id number] Date of Birth: 06-04-1945

## 2015-04-29 ENCOUNTER — Ambulatory Visit: Payer: Medicare Other

## 2015-04-29 DIAGNOSIS — M25559 Pain in unspecified hip: Secondary | ICD-10-CM

## 2015-04-29 DIAGNOSIS — R531 Weakness: Secondary | ICD-10-CM

## 2015-04-29 DIAGNOSIS — M48062 Spinal stenosis, lumbar region with neurogenic claudication: Secondary | ICD-10-CM

## 2015-04-29 DIAGNOSIS — M4806 Spinal stenosis, lumbar region: Secondary | ICD-10-CM | POA: Diagnosis not present

## 2015-04-29 NOTE — Therapy (Signed)
[facility] [address], [place], [address] Phone: [phone]   Fax:  [phone]  Physical Therapy Treatment  Patient Details  Name: [name] MRN: [id number] Date of Birth: 06-24-1945 Referring Provider: Deborra Medina, MD  Encounter Date: 04/29/2015      PT End of Session - 04/29/15 0953    Visit Number 7   Number of Visits 9   Date for PT Re-Evaluation 05/02/15   Authorization Type 7   Authorization Time Period of 10   PT Start Time 0953   PT Stop Time 1034   PT Time Calculation (min) 41 min   Activity Tolerance Patient tolerated treatment well   Behavior During Therapy [facility] for tasks assessed/performed      Past Medical History  Diagnosis Date  . Hyperlipidemia     Lipitor trial was a failure  . Hypertension   . Diabetes mellitus   . High cholesterol   . Gastritis     Past Surgical History  Procedure Laterality Date  . Spine surgery  1995  . Tubal ligation    . Spine surgery      bone graft   . Cardiac catheterization  Augus 2007    normal coronaries,  EF 60%    There were no vitals filed for this visit.  Visit Diagnosis:  Spinal stenosis, lumbar region, with neurogenic claudication  Weakness  Hip pain, unspecified laterality      Subjective Assessment - 04/29/15 0954    Subjective Pt states she just increased her prednisone for her arms today. Legs are pretty good. Did a lot of sitting and driving yesterday. Back pain current 0/10 (6/10 yesterday), hips 0/10 currently and yesterday. The area just above the past surgery is the area that started bothering her this weekend.    Pertinent History Pt states that her pain started at her hips bilaterally. When she gets up and starts walking, her symptoms improve. Symptoms began 6 months ago when pt was sitting. Pt states that her doctors think that her pain is coming from her back. Had back fusion surgery  L2 to S1 21 years ago.    Standing increases her back pain such as well as  when she cooks. 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 Back pain is much better after her surgery 21 years ago.    Diagnostic tests Has not yet had imaging.    Patient Stated Goals "Just be able to sit without having to get up and walk."   Currently in Pain? No/denies   Pain Onset More than a month ago    Driving to [facility] Friday to Tuesday.   Objectives      There-ex   Directed patient with forward wedding march holding 6 lbs dumbbell each hand 32 ft x 6,  Side stepping holding onto 6 lbs dumbbell each hand 32 ft each direction (R and L) Standing bilateral scapular retraction resisting red latex free band 10x3 with 5 second holds SLS with finger touch assist, emphasis on neutral pelvis and glute med use 10x5 seconds each LE for 2 sets, Standing bilateral scapular retraction with chin tucks 10x5 seconds for 2 sets (to promote thoracic extension and decrease stress to areas above and below area of surgery) Standing L shoulder extension resisting red latex free band 10x5 seconds. Discussed plan of care (continue and see how body feels after  driving to [place])   Improved exercise technique, movement at target joints, use of target muscles after mod verbal, visual, tactile cues.       Pt tolerated session well without complain of increased back or hip pain. Upon palpation, R rhomboid muslces more tight therefore had pt perform exercise to activate L rhomboid. Patient has made very good progress towards increasing hip strength and decreasing bilateral hip pain. Still demonstrates low back pain. Pt states driving to [place] Friday and will be back on Tuesday and would like to continue physical therapy when she returns next week to see how she tolerates the prolonged sitting. Pt was recommended to continue performing her seated exercises during the trip to help her with her  back and hip pain. Pt verbalized understanding.                   PT Education - 04/29/15 0957    Education provided Yes   Education Details ther-ex   Person(s) Educated Patient   Methods Explanation;Demonstration;Tactile cues;Verbal cues   Comprehension Verbalized understanding;Returned demonstration             PT Long Term Goals - 04/29/15 1939    PT LONG TERM GOAL #1   Title Patient will have a decrease in bilateral hip pain to 5/10 or less at worst to improve ability to tolerate sitting and standing   Time 4   Period Weeks   Status Achieved   PT LONG TERM GOAL #2   Title Patient will improve bilateral hip strength by 1/2 MMT grade to promote ability to perform standing tasks such as cooking with less hip symptoms   Time 4   Period Weeks   Status Achieved   PT LONG TERM GOAL #3   Title Patient will improve her Modified Oswestry Low Back Pain Disability Questionnaire by at least 6 points as a demonstration of improved function.    Time 4   Period Weeks   Status Achieved               Plan - 04/29/15 1018    Clinical Impression Statement Pt tolerated session well without complain of increased back or hip pain. Upon palpation, R rhomboid muslces more tight therefore had pt perform exercise to activate L rhomboid. Patient has made very good progress towards increasing hip strength and decreasing bilateral hip pain. Still demonstrates low back pain. Pt states driving to [place] Friday and will be back on Tuesday and would like to continue physical therapy when she returns next week to see how she tolerates the prolonged sitting. Pt was recommended to continue performing her seated exercises during the trip to help her with her back and hip pain. Pt verbalized understanding.    Pt will benefit from skilled therapeutic intervention in order to improve on the following deficits Pain;Decreased strength;Postural dysfunction   Rehab Potential Good   Clinical  Impairments Affecting Rehab Potential age, chronicity of condition   PT Frequency 2x / week   PT Duration 4 weeks   PT Treatment/Interventions Therapeutic exercise;Manual techniques;Therapeutic activities;Electrical Stimulation;Patient/family education;Neuromuscular re-education;Ultrasound   PT Next Visit Plan posture, thoracic extension, hip strengthening, neural mobility   Consulted and Agree with Plan of Care Patient        Problem List Patient Active Problem List   Diagnosis Date Noted  . Spinal stenosis, lumbar region, with neurogenic claudication 03/28/2015  . Long-term use of high-risk medication 09/22/2014  . Screening for osteoporosis 07/03/2014  . Rheumatoid arthritis ([place])  05/28/2014  . Special screening for malignant neoplasms, colon 01/11/2014  . Normal cytology on pap smears, but positive high risk HPV in 02/28/13, 03/08/14 and 03/22/15 02/28/2013  . Gastritis   . Fatty liver disease, nonalcoholic [id number]  . Chest pain at rest 07/07/2011  . Iron deficiency anemia 02/11/2011  . Diabetes mellitus with no complication ([place]) [id number]  . Hypertension 02/11/2011  . Hyperlipidemia     Joneen Boers PT, DPT   04/29/2015, 7:58 PM  [facility] [address], [place], [address] Phone: [phone]   Fax:  [phone]  Name: [name] MRN: [id number] Date of Birth: [birth year]

## 2015-04-30 ENCOUNTER — Ambulatory Visit (INDEPENDENT_AMBULATORY_CARE_PROVIDER_SITE_OTHER): Payer: Medicare Other | Admitting: Obstetrics & Gynecology

## 2015-04-30 ENCOUNTER — Encounter: Payer: Self-pay | Admitting: *Deleted

## 2015-04-30 ENCOUNTER — Encounter: Payer: Self-pay | Admitting: Obstetrics & Gynecology

## 2015-04-30 VITALS — BP 118/75 | HR 71 | Resp 18 | Wt 180.0 lb

## 2015-04-30 DIAGNOSIS — R87810 Cervical high risk human papillomavirus (HPV) DNA test positive: Secondary | ICD-10-CM | POA: Diagnosis not present

## 2015-04-30 NOTE — Patient Instructions (Signed)
Return to clinic for any scheduled appointments or for any gynecologic concerns as needed.   

## 2015-04-30 NOTE — Progress Notes (Signed)
    GYNECOLOGY CLINIC COLPOSCOPY PROCEDURE NOTE  69 y.o. 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 Negative for HPV 16 and 18/45 in 2015 and 2016. Given persistence of high risk HPV in the past three years, even with negative cytology and negative 16, 18/45 the last two years; colposcopy is indicated for further evaluation.  Discussed role for HPV in cervical dysplasia, need for surveillance.  Patient given informed consent, signed copy in the chart, time out was performed.  Placed in lithotomy position. Cervix viewed with speculum and colposcope after application of acetic acid.   Colposcopy adequate? Yes No visible ectocervical lesions; no biopsies obtained.  ECC specimen obtained and sent to pathology.  Patient was given post procedure instructions.  Will follow up pathology and manage accordingly.  Routine preventative health maintenance measures emphasized.    Verita Schneiders, MD, [place] Attending Obstetrician & Gynecologist, [place] for [facility]

## 2015-05-06 ENCOUNTER — Telehealth: Payer: Self-pay | Admitting: *Deleted

## 2015-05-06 NOTE — Telephone Encounter (Signed)
Called pt, no answer, left message to call office.

## 2015-05-06 NOTE — Telephone Encounter (Signed)
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  Will repeat pap and HPV test in one year.  If HPV still persistent, will consider cryotherapy or other management. Please call to inform patient of results and recommendations.

## 2015-05-06 NOTE — Telephone Encounter (Signed)
Informed pt of colpo result and recommendation to follow-up with pap and HPV testing in one year.  Pt acknowledged instructions.

## 2015-05-08 ENCOUNTER — Ambulatory Visit: Payer: Medicare Other

## 2015-05-08 DIAGNOSIS — M25559 Pain in unspecified hip: Secondary | ICD-10-CM

## 2015-05-08 DIAGNOSIS — M4806 Spinal stenosis, lumbar region: Secondary | ICD-10-CM | POA: Diagnosis not present

## 2015-05-08 DIAGNOSIS — M48062 Spinal stenosis, lumbar region with neurogenic claudication: Secondary | ICD-10-CM

## 2015-05-08 DIAGNOSIS — R531 Weakness: Secondary | ICD-10-CM

## 2015-05-08 NOTE — Therapy (Signed)
[place] [facility] [address] [place], [address] Phone: [phone]   Fax:  [phone]  Physical Therapy Treatment And Discharge Summary  Patient Details  Name: [name] MRN: [id number] Date of Birth: [birth year] Referring Provider: Deborra Medina, MD  Encounter Date: 05/08/2015      PT End of Session - 05/08/15 0849    Visit Number 8   Number of Visits 9   Date for PT Re-Evaluation 05/08/15   Authorization Type 8   Authorization Time Period of 10   PT Start Time 0849   PT Stop Time 0918   PT Time Calculation (min) 29 min   Activity Tolerance Patient tolerated treatment well   Behavior During Therapy [facility] for tasks assessed/performed      Past Medical History  Diagnosis Date  . Hyperlipidemia     Lipitor trial was a failure  . Hypertension   . Diabetes mellitus   . High cholesterol   . Gastritis     Past Surgical History  Procedure Laterality Date  . Spine surgery  1995  . Tubal ligation    . Spine surgery      bone graft   . Cardiac catheterization  Augus 2007    normal coronaries,  EF 60%    There were no vitals filed for this visit.  Visit Diagnosis:  Spinal stenosis, lumbar region, with neurogenic claudication - Plan: PT plan of care cert/re-cert  Weakness - Plan: PT plan of care cert/re-cert  Hip pain, unspecified laterality - Plan: PT plan of care cert/re-cert      Subjective Assessment - 05/08/15 0850    Subjective L posterior hip and proximal posterior L thigh and back did not really hurt during the prolonged sitting driving to [place] and back. Was not really doing any standing and cooking. 0/10 back and hip pain.    Pertinent History Pt states that her pain started at her hips bilaterally. When she gets up and starts walking, her symptoms improve. Symptoms began 6 months ago when pt was sitting. Pt states that her doctors think that her pain is coming from her back. Had  back fusion surgery  L2 to S1 21 years ago.   Standing increases her back pain such as well as  when she cooks. 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 Back pain is much better after her surgery 21 years ago.    Diagnostic tests Has not yet had imaging.    Patient Stated Goals "Just be able to sit without having to get up and walk."   Currently in Pain? No/denies   Pain Score 0-No pain   Pain Onset More than a month ago   Multiple Pain Sites No        Objectives     There-ex  Directed patient with forward wedding march holding 7 lbs dumbbell each hand 32 ft x 6,  Standing bilateral scapular retraction resisting green latex free band 10x3 with 5 second holds SLS with finger touch assist, emphasis on neutral pelvis and glute med use 10x5 seconds each LE. Reviewed and given as part of her HEP. Pt demonstrated and verbalized understanding.   Reviewed HEP with pt.  Reviewed suggestions on when to perform HEP suggesting times during the day to perform exercises such as when taking a break from standing chores, or when standing in line at the store  or when sitting at bible study or in car.    Improved exercise technique, movement at target joints, use of target muscles after min verbal, visual, tactile cues.    Pt tolerated session without complain of pain. Pt has demonstrated improved bilateral hip strength, function, and decreased pain since initial evaluation. Patient has achieved all goals. Skilled physical therapy services discharged with pt continuing progress with her HEP after today's recert.        [facility] PT Assessment - 05/23/2015 0001    Observation/Other Assessments   Modified Oswertry 22% (24 % improvement since initial evaluation)  Measured on 04/25/15   Strength   Overall Strength Comments strength measured on 04/25/15   Right Hip Flexion 4+/5   Right Hip Extension 4+/5   Right Hip External Rotation  4+/5    Right Hip ABduction 4+/5   Left Hip Flexion 4+/5   Left Hip Extension 4+/5   Left Hip External Rotation 4+/5   Left Hip ABduction 4+/5                             PT Education - May 23, 2015 1228    Education provided Yes   Education Details ther-ex, plan of care   Person(s) Educated Patient   Methods Explanation;Demonstration;Verbal cues   Comprehension Verbalized understanding;Returned demonstration             PT Long Term Goals - 04/29/15 1939    PT LONG TERM GOAL #1   Title Patient will have a decrease in bilateral hip pain to 5/10 or less at worst to improve ability to tolerate sitting and standing   Time 4   Period Weeks   Status Achieved   PT LONG TERM GOAL #2   Title Patient will improve bilateral hip strength by 1/2 MMT grade to promote ability to perform standing tasks such as cooking with less hip symptoms   Time 4   Period Weeks   Status Achieved   PT LONG TERM GOAL #3   Title Patient will improve her Modified Oswestry Low Back Pain Disability Questionnaire by at least 6 points as a demonstration of improved function.    Time 4   Period Weeks   Status Achieved               Plan - 23-May-2015 [id number]    Clinical Impression Statement Pt tolerated session without complain of pain. Pt has demonstrated improved bilateral hip strength, function, and decreased pain since initial evaluation. Patient has achieved all goals. Skilled physical therapy services discharged with pt continuing progress with her HEP after today's recert.    Pt will benefit from skilled therapeutic intervention in order to improve on the following deficits Pain;Decreased strength;Postural dysfunction   Rehab Potential Good   Clinical Impairments Affecting Rehab Potential age, chronicity of condition   PT Frequency 2x / week   PT Duration 4 weeks   PT Treatment/Interventions Therapeutic exercise;Manual techniques;Therapeutic activities;Electrical Stimulation;Patient/family  education;Neuromuscular re-education;Ultrasound   PT Next Visit Plan posture, thoracic extension, hip strengthening, neural mobility   Consulted and Agree with Plan of Care Patient          G-Codes - 2015-05-23 1233    Functional Assessment Tool Used Modified Oswestry Low Back Pain Disability Questionnaire, clinical presentation, patient interview.    Functional Limitation Changing and maintaining body position   Changing and Maintaining Body Position Goal Status [id number]) At least 20 percent but less than 40 percent impaired,  limited or restricted   Changing and Maintaining Body Position Discharge Status [id number]) At least 20 percent but less than 40 percent impaired, limited or restricted      Problem List Patient Active Problem List   Diagnosis Date Noted  . Spinal stenosis, lumbar region, with neurogenic claudication 03/28/2015  . Long-term use of high-risk medication 09/22/2014  . Screening for osteoporosis 07/03/2014  . Rheumatoid arthritis ([place]) 05/28/2014  . Special screening for malignant neoplasms, colon 01/11/2014  . Normal cytology on pap smears, but positive high risk HPV in 02/28/13, 03/08/14 and 03/22/15 02/28/2013  . Gastritis   . Fatty liver disease, nonalcoholic [id number]  . Chest pain at rest 07/07/2011  . Iron deficiency anemia 02/11/2011  . Diabetes mellitus with no complication ([place]) [id number]  . Hypertension 02/11/2011  . Hyperlipidemia    Thank you for your referral.   Joneen Boers PT, DPT   05/08/2015, 12:52 PM  [facility] [address] [place], [address] Phone: [phone]   Fax:  [phone]  Name: KAYALA TEJADA MRN: [id number] Date of Birth: 11-20-1945

## 2015-06-21 ENCOUNTER — Telehealth: Payer: Self-pay | Admitting: *Deleted

## 2015-06-21 ENCOUNTER — Other Ambulatory Visit (INDEPENDENT_AMBULATORY_CARE_PROVIDER_SITE_OTHER): Payer: Medicare Other

## 2015-06-21 DIAGNOSIS — I10 Essential (primary) hypertension: Secondary | ICD-10-CM

## 2015-06-21 DIAGNOSIS — Z79899 Other long term (current) drug therapy: Secondary | ICD-10-CM

## 2015-06-21 DIAGNOSIS — D509 Iron deficiency anemia, unspecified: Secondary | ICD-10-CM | POA: Diagnosis not present

## 2015-06-21 DIAGNOSIS — E119 Type 2 diabetes mellitus without complications: Secondary | ICD-10-CM

## 2015-06-21 LAB — COMPREHENSIVE METABOLIC PANEL
ALT: 16 U/L (ref 0–35)
AST: 25 U/L (ref 0–37)
Albumin: 3.9 g/dL (ref 3.5–5.2)
Alkaline Phosphatase: 81 U/L (ref 39–117)
BUN: 17 mg/dL (ref 6–23)
CO2: 31 mEq/L (ref 19–32)
Calcium: 9.3 mg/dL (ref 8.4–10.5)
Chloride: 103 mEq/L (ref 96–112)
Creatinine, Ser: 0.71 mg/dL (ref 0.40–1.20)
GFR: 86.93 mL/min (ref >60.00)
Glucose, Bld: 118 mg/dL — ABNORMAL HIGH (ref 70–99)
Potassium: 4.4 mEq/L (ref 3.5–5.1)
Sodium: 140 mEq/L (ref 135–145)
Total Bilirubin: 0.7 mg/dL (ref 0.2–1.2)
Total Protein: 6.4 g/dL (ref 6.0–8.3)

## 2015-06-21 LAB — LIPID PANEL
Cholesterol: 124 mg/dL (ref 0–200)
HDL: 46.6 mg/dL
LDL Cholesterol: 59 mg/dL (ref 0–99)
NonHDL: 77.22
Total CHOL/HDL Ratio: 3
Triglycerides: 92 mg/dL (ref 0.0–149.0)
VLDL: 18.4 mg/dL (ref 0.0–40.0)

## 2015-06-21 LAB — CBC WITH DIFFERENTIAL/PLATELET
Basophils Absolute: 0 10*3/uL (ref 0.0–0.1)
Basophils Relative: 0.6 % (ref 0.0–3.0)
Eosinophils Absolute: 0.1 10*3/uL (ref 0.0–0.7)
Eosinophils Relative: 1.4 % (ref 0.0–5.0)
HCT: 39.7 % (ref 36.0–46.0)
Hemoglobin: 12.6 g/dL (ref 12.0–15.0)
Lymphocytes Relative: 25.9 % (ref 12.0–46.0)
Lymphs Abs: 1.6 10*3/uL (ref 0.7–4.0)
MCHC: 31.7 g/dL (ref 30.0–36.0)
MCV: 88.9 fl (ref 78.0–100.0)
Monocytes Absolute: 0.3 10*3/uL (ref 0.1–1.0)
Monocytes Relative: 5.6 % (ref 3.0–12.0)
Neutro Abs: 4.1 10*3/uL (ref 1.4–7.7)
Neutrophils Relative %: 66.5 % (ref 43.0–77.0)
Platelets: 352 10*3/uL (ref 150.0–400.0)
RBC: 4.47 Mil/uL (ref 3.87–5.11)
RDW: 17.6 % — ABNORMAL HIGH (ref 11.5–15.5)
WBC: 6.2 10*3/uL (ref 4.0–10.5)

## 2015-06-21 LAB — HEMOGLOBIN A1C: Hgb A1c MFr Bld: 7.2 % — ABNORMAL HIGH (ref 4.6–6.5)

## 2015-06-21 LAB — MICROALBUMIN / CREATININE URINE RATIO
Creatinine,U: 152.4 mg/dL
Microalb Creat Ratio: 0.5 mg/g (ref 0.0–30.0)
Microalb, Ur: 0.8 mg/dL (ref 0.0–1.9)

## 2015-06-21 LAB — LDL CHOLESTEROL, DIRECT: Direct LDL: 65 mg/dL

## 2015-06-21 NOTE — Telephone Encounter (Signed)
The urine was ordered at last visit and never collected.  Did you  Collect it today?

## 2015-06-21 NOTE — Telephone Encounter (Signed)
Thank you :)

## 2015-06-21 NOTE — Telephone Encounter (Signed)
Yes

## 2015-06-21 NOTE — Telephone Encounter (Signed)
Labs and dx?  

## 2015-06-23 ENCOUNTER — Encounter: Payer: Self-pay | Admitting: Internal Medicine

## 2015-06-28 ENCOUNTER — Encounter: Payer: Self-pay | Admitting: Internal Medicine

## 2015-06-28 ENCOUNTER — Telehealth: Payer: Self-pay | Admitting: Internal Medicine

## 2015-06-28 ENCOUNTER — Ambulatory Visit (INDEPENDENT_AMBULATORY_CARE_PROVIDER_SITE_OTHER): Payer: Medicare Other | Admitting: Internal Medicine

## 2015-06-28 VITALS — BP 110/68 | HR 67 | Temp 97.4°F | Resp 12 | Ht 66.0 in | Wt 185.5 lb

## 2015-06-28 DIAGNOSIS — E119 Type 2 diabetes mellitus without complications: Secondary | ICD-10-CM

## 2015-06-28 DIAGNOSIS — K5900 Constipation, unspecified: Secondary | ICD-10-CM

## 2015-06-28 DIAGNOSIS — E785 Hyperlipidemia, unspecified: Secondary | ICD-10-CM

## 2015-06-28 DIAGNOSIS — K760 Fatty (change of) liver, not elsewhere classified: Secondary | ICD-10-CM

## 2015-06-28 DIAGNOSIS — R5383 Other fatigue: Secondary | ICD-10-CM

## 2015-06-28 DIAGNOSIS — E559 Vitamin D deficiency, unspecified: Secondary | ICD-10-CM

## 2015-06-28 MED ORDER — METFORMIN HCL 1000 MG PO TABS: 1000.0000 mg | ORAL_TABLET | Freq: Two times a day (BID) | ORAL | Status: DC

## 2015-06-28 NOTE — Progress Notes (Signed)
Subjective:  Patient ID: [name], female    DOB: Feb 26, 1946  Age: 70 y.o. MRN: [id number]  CC: The primary encounter diagnosis was Diabetes mellitus with no complication ([place]). Diagnoses of Fatty liver disease, nonalcoholic, Constipation, unspecified constipation type, and Other fatigue were also pertinent to this visit.  HPI [name] presents for 3 month follow up on diabetes.  Patient has no complaints today.  Patient is following a low glycemic index diet and taking all prescribed medications regularly without side effects.  Fasting sugars have been under less than 120  and post prandial lunch sugars have been under 160 except on rare occasions. She has been having frequent hypoglycemic events postprandially in the evening  And is taking glipizide at lunch and metformi at supper    Patient is exercising about 3 times per week and intentionally trying to lose weight .  Patient has had an eye exam in the last 12 months and checks feet regularly for signs of infection. Diet:  Breakfast is a lower carb cereal with almonds. Patient does not walk barefoot outside,  And denies an numbness tingling or burning in feet. Patient is up to date on all recommended vaccinations.   New issues:  1) Worsening Constipation for the past 2 months despite taking fiber pills daily. tried adding stool softener with no results.  Not having a BM unless she takes a stimulant laxative daily.  No use of opioids,  Just Aleve. Last colonscopy  2015 showed diverticulosis  2) Fatigue:  Has been falling  asleep by 7 pm.  Sleeps on her side . Waking  up tired. No headache,  Some Weight gain of 7 lbs since August, BMI now 30.      Outpatient Prescriptions Prior to Visit  Medication Sig Dispense Refill  . aspirin 81 MG tablet Take 1 tablet (81 mg total) by mouth daily. 30 tablet 2  . atorvastatin (LIPITOR) 40 MG tablet Take 1 tablet (40 mg total) by mouth daily at 6 PM. (Patient taking differently: Take 20 mg by  mouth daily at 6 PM. ) 90 tablet 1  . blood glucose meter kit and supplies KIT Dispense based on patient and insurance preference. Use up to four times daily as directed. (FOR ICD-9 250.00, 250.01). 1 each 0  . calcium-vitamin D (OSCAL WITH D) 250-125 MG-UNIT per tablet Take 2 tablets by mouth daily.    . famotidine (PEPCID) 40 MG tablet Take 1 tablet (40 mg total) by mouth daily. 90 tablet 1  . fish oil-omega-3 fatty acids 1000 MG capsule Take 2 g by mouth 2 (two) times daily.      . folic acid (FOLVITE) 1 MG tablet Take 1 tablet by mouth daily.    Marland Kitchen glipiZIDE (GLUCOTROL) 10 MG tablet 10 mg in Am and 5 mg in PM before meals 135 tablet 1  . glucose blood (ACCU-CHEK AVIVA PLUS) test strip test CBG three times daily,  E11.65 300 each 3  . losartan (COZAAR) 50 MG tablet TAKE 1 TABLET (50 MG TOTAL) BY MOUTH DAILY. 90 tablet 1  . methotrexate (RHEUMATREX) 2.5 MG tablet Take by mouth. Take 1 by mouth every 7 days. 8 pills once a week with a meal.    . Methotrexate, Anti-Rheumatic, (METHOTREXATE, PF, [place]) Inject into the skin.    . Multiple Vitamin (MULTIVITAMIN) tablet Take 1 tablet by mouth daily.    . predniSONE (DELTASONE) 5 MG tablet Take 2.5 mg by mouth daily with breakfast.     .  vitamin C (ASCORBIC ACID) 500 MG tablet Take 500 mg by mouth daily.    . vitamin E 100 UNIT capsule Take 100 Units by mouth daily.    . metFORMIN (GLUCOPHAGE) 500 MG tablet TAKE 1 TABLET (500 MG TOTAL) BY MOUTH 2 (TWO) TIMES DAILY WITH A MEAL. 180 tablet 3  . HYDROcodone-acetaminophen (NORCO/VICODIN) 5-325 MG per tablet Take 1 tablet by mouth every 6 (six) hours as needed for moderate pain. (Patient not taking: Reported on 04/01/2015) 60 tablet 0  . insulin aspart protamine- aspart (NOVOLOG MIX 70/30) (70-30) 100 UNIT/ML injection Inject 15 units at breakfast , None at lunch, and 10 units at dinner. (Patient not taking: Reported on 04/01/2015) 10 mL 11  . Insulin Syringes, Disposable, U-100 1 ML MISC Use to inject insulin  Twice daily (Patient not taking: Reported on 04/30/2015) 100 each 3   No facility-administered medications prior to visit.    Review of Systems;  Patient denies headache, fevers, malaise, unintentional weight loss, skin rash, eye pain, sinus congestion and sinus pain, sore throat, dysphagia,  hemoptysis , cough, dyspnea, wheezing, chest pain, palpitations, orthopnea, edema, abdominal pain, nausea, melena, diarrhea, constipation, flank pain, dysuria, hematuria, urinary  Frequency, nocturia, numbness, tingling, seizures,  Focal weakness, Loss of consciousness,  Tremor, insomnia, depression, anxiety, and suicidal ideation.      Objective:  BP 110/68 mmHg  Pulse 67  Temp(Src) 97.4 F (36.3 C) (Oral)  Resp 12  Ht _0  (1.676 m)  Wt 185 lb 8 oz (84.142 kg)  BMI 29.95 kg/m2  SpO2 98%  BP Readings from Last 3 Encounters:  06/28/15 110/68  04/30/15 118/75  03/28/15 118/76    Wt Readings from Last 3 Encounters:  06/28/15 185 lb 8 oz (84.142 kg)  04/30/15 180 lb (81.647 kg)  03/28/15 182 lb (82.555 kg)    General appearance: alert, cooperative and appears stated age Ears: normal TM's and external ear canals both ears Throat: lips, mucosa, and tongue normal; teeth and gums normal Neck: no adenopathy, no carotid bruit, supple, symmetrical, trachea midline and thyroid not enlarged, symmetric, no tenderness/mass/nodules Back: symmetric, no curvature. ROM normal. No CVA tenderness. Lungs: clear to auscultation bilaterally Heart: regular rate and rhythm, S1, S2 normal, no murmur, click, rub or gallop Abdomen: soft, non-tender; bowel sounds normal; no masses,  no organomegaly Pulses: 2+ and symmetric Skin: Skin color, texture, turgor normal. No rashes or lesions Lymph nodes: Cervical, supraclavicular, and axillary nodes normal.  Lab Results  Component Value Date   HGBA1C 7.2* 06/21/2015   HGBA1C 7.2* 03/22/2015   HGBA1C 8.3* 12/21/2014    Lab Results  Component Value Date    CREATININE 0.71 06/21/2015   CREATININE 0.71 03/22/2015   CREATININE 0.66 12/21/2014    Lab Results  Component Value Date   WBC 6.2 06/21/2015   HGB 12.6 06/21/2015   HCT 39.7 06/21/2015   PLT 352.0 06/21/2015   GLUCOSE 118* 06/21/2015   CHOL 124 06/21/2015   TRIG 92.0 06/21/2015   HDL 46.60 06/21/2015   LDLDIRECT 65.0 06/21/2015   LDLCALC 59 06/21/2015   ALT 16 06/21/2015   AST 25 06/21/2015   NA 140 06/21/2015   K 4.4 06/21/2015   CL 103 06/21/2015   CREATININE 0.71 06/21/2015   BUN 17 06/21/2015   CO2 31 06/21/2015   TSH 2.12 05/14/2014   HGBA1C 7.2* 06/21/2015   MICROALBUR 0.8 06/21/2015    Dg Lumbar Spine Complete  04/01/2015  CLINICAL DATA:  Spinal stenosis, lumbar region, with  neurogenic claudication. Patient has had a spinal fusion from L2 through the sacrum. Bilateral sciatica for 6 months. No known injury. EXAM: LUMBAR SPINE - COMPLETE 4+ VIEW COMPARISON:  None. FINDINGS: There are bilateral pedicle screws at L2, L3, L4 and S1. Interconnecting rods connect the pedicle screws. The orthopedic hardware is well-seated and aligned with no evidence of loosening. There is no fracture.  There is no spondylolisthesis. Moderate loss of disc height is noted throughout the visualized spine. There is mature bone graft material along the lateral posterior aspect of the mid to lower lumbar spine. The bones are diffusely demineralized. Soft tissues are unremarkable. IMPRESSION: 1. No fracture or acute finding. 2. Orthopedic hardware is well-seated with no evidence of loosening. 3. Disc degenerative changes and diffuse bony demineralization. Electronically Signed   By: Lajean Manes M.D.   On: 04/01/2015 16:30    Assessment & Plan:   Problem List Items Addressed This Visit    Diabetes mellitus with no complication ([place]) - Primary    Improving control and now  having frequent hypoglycemic events on current regimen .  Stop lunchtime glipizide and ue 5 mg at night for preprandial sugars  > 160 ,  increase metformin to 1000 mg bid .   Conitnue statin,  ASA,  An ARB  Lab Results  Component Value Date   HGBA1C 7.2* 06/21/2015   Lab Results  Component Value Date   MICROALBUR 0.8 06/21/2015             Relevant Medications   metFORMIN (GLUCOPHAGE) 1000 MG tablet   Fatty liver disease, nonalcoholic    Secondary to obesity and diabetes/metabolic syndrome.I have addressed  BMI and recommended a low glycemic index diet utilizing smaller more frequent meals to increase metabolism.  I have also recommended that patient start exercising with a goal of 30 minutes of aerobic exercise a minimum of 5 days per week.   Lab Results  Component Value Date   ALT 16 06/21/2015   AST 25 06/21/2015   ALKPHOS 81 06/21/2015   BILITOT 0.7 06/21/2015           Constipation    High fiber diet recommended , advised to limit stimulant laxative use to twice weekly.  Increase in metformin may aid.       Fatigue    with non restorative sleep.  Prior thyroid screen reviewed,  Anemia ruled  out. ,  suspect OSA.  Sleep study recommended, she will discuss with husband.   Lab Results  Component Value Date   TSH 2.12 05/14/2014   Lab Results  Component Value Date   WBC 6.2 06/21/2015   HGB 12.6 06/21/2015   HCT 39.7 06/21/2015   MCV 88.9 06/21/2015   PLT 352.0 06/21/2015           A total of 25 minutes of face to face time was spent with patient more than half of which was spent in counselling about the above mentioned conditions  and coordination of care   I have discontinued Ms. Lagrand Insulin Syringes (Disposable), HYDROcodone-acetaminophen, and insulin aspart protamine- aspart. I have also changed her metFORMIN. Additionally, I am having her maintain her fish oil-omega-3 fatty acids, vitamin C, vitamin E, multivitamin, aspirin, calcium-vitamin D, predniSONE, blood glucose meter kit and supplies, methotrexate, folic acid, atorvastatin, glipiZIDE, (Methotrexate, Anti-Rheumatic,  (METHOTREXATE, PF, [place])), glucose blood, famotidine, losartan, and bisacodyl.  Meds ordered this encounter  Medications  . bisacodyl (DULCOLAX) 5 MG EC tablet    Sig: Take  5 mg by mouth daily as needed for moderate constipation.  . metFORMIN (GLUCOPHAGE) 1000 MG tablet    Sig: Take 1 tablet (1,000 mg total) by mouth 2 (two) times daily with a meal.    Dispense:  180 tablet    Refill:  1    Medications Discontinued During This Encounter  Medication Reason  . HYDROcodone-acetaminophen (NORCO/VICODIN) 5-325 MG per tablet Patient Preference  . insulin aspart protamine- aspart (NOVOLOG MIX 70/30) (70-30) 100 UNIT/ML injection Patient Preference  . Insulin Syringes, Disposable, U-100 1 ML MISC Error  . insulin aspart protamine- aspart (NOVOLOG MIX 70/30) (70-30) 100 UNIT/ML injection Patient Preference  . HYDROcodone-acetaminophen (NORCO/VICODIN) 5-325 MG per tablet Patient Preference  . Insulin Syringes, Disposable, U-100 1 ML MISC Error  . metFORMIN (GLUCOPHAGE) 500 MG tablet Reorder    Follow-up: Return in about 3 months (around 09/25/2015) for follow up diabetes.   Crecencio Mc, MD

## 2015-06-28 NOTE — Progress Notes (Signed)
Pre-visit discussion using our clinic review tool. No additional management support is needed unless otherwise documented below in the visit note.  

## 2015-06-28 NOTE — Patient Instructions (Addendum)
Your hypoglycemic events are too frequent!   Stop the lunchtime glipizide .  Take 10 mg  IN THE MORNING AND take 5 mg glipizide with dinner ONLY IF PRE DINNER SUGAR IS > 150   INCREASE THE  METFORMIN  TO 1000 mg metformin twice daily   Try the [employer] Flakes cereal available at [facility] on the bottom shelf, with vanilla flavored almond milk. 24 carbs,  5 g of fiber   You need a minimum of 48 ounces of water daily  Try eating  The  [place] whole wheat tortilla  Has 26 g fiber per serving   Ask husband about your sleep pattern:  Are your snoring,  Tossing and turning?   There is a low carb flour called carbalose that is ultra low carb. (almond flour) if you want to have pancakes

## 2015-06-28 NOTE — Telephone Encounter (Signed)
Please advise, thanks.

## 2015-06-28 NOTE — Telephone Encounter (Signed)
Pt needs lab work before next appt. Need orders please and thank you! Call pt @ [phone].

## 2015-06-29 ENCOUNTER — Other Ambulatory Visit: Payer: Self-pay | Admitting: Internal Medicine

## 2015-06-30 DIAGNOSIS — R5383 Other fatigue: Secondary | ICD-10-CM | POA: Insufficient documentation

## 2015-06-30 DIAGNOSIS — K5900 Constipation, unspecified: Secondary | ICD-10-CM | POA: Insufficient documentation

## 2015-06-30 NOTE — Assessment & Plan Note (Signed)
Improving control and now  having frequent hypoglycemic events on current regimen .  Stop lunchtime glipizide and ue 5 mg at night for preprandial sugars > 160 ,  increase metformin to 1000 mg bid .   Conitnue statin,  ASA,  An ARB  Lab Results  Component Value Date   HGBA1C 7.2* 06/21/2015   Lab Results  Component Value Date   MICROALBUR 0.8 06/21/2015

## 2015-06-30 NOTE — Assessment & Plan Note (Signed)
with non restorative sleep.  Prior thyroid screen reviewed,  Anemia ruled  out. ,  suspect OSA.  Sleep study recommended, she will discuss with husband.   Lab Results  Component Value Date   TSH 2.12 05/14/2014   Lab Results  Component Value Date   WBC 6.2 06/21/2015   HGB 12.6 06/21/2015   HCT 39.7 06/21/2015   MCV 88.9 06/21/2015   PLT 352.0 06/21/2015

## 2015-06-30 NOTE — Assessment & Plan Note (Signed)
High fiber diet recommended , advised to limit stimulant laxative use to twice weekly.  Increase in metformin may aid.

## 2015-06-30 NOTE — Assessment & Plan Note (Signed)
Secondary to obesity and diabetes/metabolic syndrome.I have addressed  BMI and recommended a low glycemic index diet utilizing smaller more frequent meals to increase metabolism.  I have also recommended that patient start exercising with a goal of 30 minutes of aerobic exercise a minimum of 5 days per week.   Lab Results  Component Value Date   ALT 16 06/21/2015   AST 25 06/21/2015   ALKPHOS 81 06/21/2015   BILITOT 0.7 06/21/2015

## 2015-07-03 NOTE — Telephone Encounter (Signed)
Pt called back and sch appt

## 2015-07-03 NOTE — Telephone Encounter (Signed)
Left pt a message to call me to sch lab work.

## 2015-07-03 NOTE — Telephone Encounter (Signed)
Labs ordered for after May 3

## 2015-07-03 NOTE — Telephone Encounter (Signed)
Please call and schedule patient now. thanks

## 2015-07-08 NOTE — Telephone Encounter (Signed)
Mailed unread message to patient.  

## 2015-07-10 ENCOUNTER — Other Ambulatory Visit: Payer: Self-pay | Admitting: Internal Medicine

## 2015-09-19 ENCOUNTER — Other Ambulatory Visit: Payer: Medicare Other

## 2015-09-23 ENCOUNTER — Other Ambulatory Visit (INDEPENDENT_AMBULATORY_CARE_PROVIDER_SITE_OTHER): Payer: Medicare Other

## 2015-09-23 DIAGNOSIS — E559 Vitamin D deficiency, unspecified: Secondary | ICD-10-CM | POA: Diagnosis not present

## 2015-09-23 DIAGNOSIS — E119 Type 2 diabetes mellitus without complications: Secondary | ICD-10-CM | POA: Diagnosis not present

## 2015-09-23 DIAGNOSIS — E785 Hyperlipidemia, unspecified: Secondary | ICD-10-CM

## 2015-09-23 LAB — COMPREHENSIVE METABOLIC PANEL
ALT: 14 U/L (ref 0–35)
AST: 22 U/L (ref 0–37)
Albumin: 4 g/dL (ref 3.5–5.2)
Alkaline Phosphatase: 82 U/L (ref 39–117)
BUN: 16 mg/dL (ref 6–23)
CO2: 27 mEq/L (ref 19–32)
Calcium: 9.4 mg/dL (ref 8.4–10.5)
Chloride: 103 mEq/L (ref 96–112)
Creatinine, Ser: 0.63 mg/dL (ref 0.40–1.20)
GFR: 99.71 mL/min (ref >60.00)
Glucose, Bld: 124 mg/dL — ABNORMAL HIGH (ref 70–99)
Potassium: 4.2 mEq/L (ref 3.5–5.1)
Sodium: 138 mEq/L (ref 135–145)
Total Bilirubin: 0.9 mg/dL (ref 0.2–1.2)
Total Protein: 6.5 g/dL (ref 6.0–8.3)

## 2015-09-23 LAB — VITAMIN D 25 HYDROXY (VIT D DEFICIENCY, FRACTURES): VITD: 40.05 ng/mL (ref 30.00–100.00)

## 2015-09-23 LAB — HEMOGLOBIN A1C: Hgb A1c MFr Bld: 6.7 % — ABNORMAL HIGH (ref 4.6–6.5)

## 2015-09-23 LAB — LIPID PANEL
Cholesterol: 123 mg/dL (ref 0–200)
HDL: 52.7 mg/dL (ref >39.00)
LDL Cholesterol: 58 mg/dL (ref 0–99)
NonHDL: 69.82
Total CHOL/HDL Ratio: 2
Triglycerides: 57 mg/dL (ref 0.0–149.0)
VLDL: 11.4 mg/dL (ref 0.0–40.0)

## 2015-09-23 LAB — LDL CHOLESTEROL, DIRECT: Direct LDL: 65 mg/dL

## 2015-09-25 ENCOUNTER — Encounter: Payer: Self-pay | Admitting: Internal Medicine

## 2015-09-25 ENCOUNTER — Ambulatory Visit (INDEPENDENT_AMBULATORY_CARE_PROVIDER_SITE_OTHER): Payer: Medicare Other | Admitting: Internal Medicine

## 2015-09-25 VITALS — BP 128/80 | HR 81 | Temp 97.6°F | Resp 12 | Ht 66.0 in | Wt 180.5 lb

## 2015-09-25 DIAGNOSIS — M2550 Pain in unspecified joint: Secondary | ICD-10-CM

## 2015-09-25 DIAGNOSIS — K760 Fatty (change of) liver, not elsewhere classified: Secondary | ICD-10-CM | POA: Diagnosis not present

## 2015-09-25 DIAGNOSIS — Z79899 Other long term (current) drug therapy: Secondary | ICD-10-CM

## 2015-09-25 DIAGNOSIS — R5383 Other fatigue: Secondary | ICD-10-CM

## 2015-09-25 DIAGNOSIS — E785 Hyperlipidemia, unspecified: Secondary | ICD-10-CM

## 2015-09-25 DIAGNOSIS — E119 Type 2 diabetes mellitus without complications: Secondary | ICD-10-CM

## 2015-09-25 DIAGNOSIS — M25512 Pain in left shoulder: Secondary | ICD-10-CM

## 2015-09-25 LAB — CBC WITH DIFFERENTIAL/PLATELET
Basophils Absolute: 0 10*3/uL (ref 0.0–0.1)
Basophils Relative: 0.5 % (ref 0.0–3.0)
Eosinophils Absolute: 0.1 10*3/uL (ref 0.0–0.7)
Eosinophils Relative: 1.2 % (ref 0.0–5.0)
HCT: 38.7 % (ref 36.0–46.0)
Hemoglobin: 12.5 g/dL (ref 12.0–15.0)
Lymphocytes Relative: 26.6 % (ref 12.0–46.0)
Lymphs Abs: 1.6 10*3/uL (ref 0.7–4.0)
MCHC: 32.3 g/dL (ref 30.0–36.0)
MCV: 88.1 fl (ref 78.0–100.0)
Monocytes Absolute: 0.4 10*3/uL (ref 0.1–1.0)
Monocytes Relative: 6 % (ref 3.0–12.0)
Neutro Abs: 4 10*3/uL (ref 1.4–7.7)
Neutrophils Relative %: 65.7 % (ref 43.0–77.0)
Platelets: 311 10*3/uL (ref 150.0–400.0)
RBC: 4.39 Mil/uL (ref 3.87–5.11)
RDW: 17 % — ABNORMAL HIGH (ref 11.5–15.5)
WBC: 6.1 10*3/uL (ref 4.0–10.5)

## 2015-09-25 LAB — VITAMIN B12: Vitamin B-12: 488 pg/mL (ref 211–911)

## 2015-09-25 LAB — IRON AND TIBC
%SAT: 16 % (ref 11–50)
Iron: 66 ug/dL (ref 45–160)
TIBC: 404 ug/dL (ref 250–450)
UIBC: 338 ug/dL (ref 125–400)

## 2015-09-25 LAB — SEDIMENTATION RATE: Sed Rate: 18 mm/hr (ref 0–22)

## 2015-09-25 LAB — C-REACTIVE PROTEIN: CRP: 0.2 mg/dL — ABNORMAL LOW (ref 0.5–20.0)

## 2015-09-25 MED ORDER — TRAMADOL HCL 50 MG PO TABS
50.0000 mg | ORAL_TABLET | Freq: Two times a day (BID) | ORAL | Status: DC | PRN
Start: 2015-09-25 — End: 2017-03-12

## 2015-09-25 NOTE — Progress Notes (Signed)
Subjective:  Patient ID: [name], female    DOB: Sep 28, 1945  Age: 69 y.o. MRN: [id number]  CC: The primary encounter diagnosis was Long-term use of high-risk medication. Diagnoses of Pain in joint involving multiple sites, Other fatigue, Fatty liver disease, nonalcoholic, Diabetes mellitus with no complication ([place]), Pain in joint of left shoulder, and Hyperlipidemia were also pertinent to this visit.  HPI [name] presents for 3 month follow up on diabetes.    Patient is following a low glycemic index diet and taking all prescribed medications regularly.  She has been checking her sugars 4 times daily , usually  Pre and post lunch and dinner,  and  had averaged an occurrence of low blood sugar about 4 times per week,  At various times no pattern noted on review of log today. .She has been suspending her metformin when pre meals sugars are low and  has continued glipizide out of preference despite advice to the contrary.   Fasting sugars have been under 130 and  post prandials have been under 130 without exception.   Patient is exercising about 5 times per week and intentionally trying to lose weight .  Patient has had an eye exam in the last 12 months and checks feet regularly for signs of infection.  Patient does not walk barefoot outside,  And denies an numbness tingling or burning in feet. Patient is up to date on all recommended vaccinations but is not sure when her last Hep A/B vaccine was given during her employment at [employer]  Joints have been  hurting more for the past month  .  Left shoulder the worst.  Can barely lift her left arm .  Felt better after a deep tissue massage recently,  but only transiently. Has been off of prednisone completely for a month.  Still taking MTX .  Pain is affecting  Her sleep , because turning over causes pain and wakes her up.  Not taking any analgesics,. Has taken a sub therapeutic dose of  aleve which may have helped. Does NOT WANT to resume prednisone bc  of DM,  Sees [facility] in July.     Discussed fatty liver diagnosis.    Outpatient Prescriptions Prior to Visit  Medication Sig Dispense Refill  . aspirin 81 MG tablet Take 1 tablet (81 mg total) by mouth daily. 30 tablet 2  . atorvastatin (LIPITOR) 80 MG tablet TAKE 1 TABLET (80 MG TOTAL) BY MOUTH DAILY. (Patient taking differently: TAKE 0.5 TABLET (40 MG TOTAL) BY MOUTH DAILY.) 90 tablet 1  . bisacodyl (DULCOLAX) 5 MG EC tablet Take 5 mg by mouth daily as needed for moderate constipation.    . blood glucose meter kit and supplies KIT Dispense based on patient and insurance preference. Use up to four times daily as directed. (FOR ICD-9 250.00, 250.01). 1 each 0  . calcium-vitamin D (OSCAL WITH D) 250-125 MG-UNIT per tablet Take 2 tablets by mouth daily.    . famotidine (PEPCID) 40 MG tablet TAKE 1 TABLET (40 MG TOTAL) BY MOUTH DAILY. 90 tablet 1  . fish oil-omega-3 fatty acids 1000 MG capsule Take 2 g by mouth 2 (two) times daily.      Marland Kitchen glipiZIDE (GLUCOTROL) 10 MG tablet 10 mg in Am and 5 mg in PM before meals (Patient taking differently: Take 10 mg by mouth daily before breakfast. 10 mg in Am and 5 mg in PM before meals) 135 tablet 1  . glucose blood (ACCU-CHEK AVIVA  PLUS) test strip test CBG three times daily,  E11.65 300 each 3  . losartan (COZAAR) 50 MG tablet TAKE 1 TABLET (50 MG TOTAL) BY MOUTH DAILY. 90 tablet 1  . metFORMIN (GLUCOPHAGE) 1000 MG tablet Take 1 tablet (1,000 mg total) by mouth 2 (two) times daily with a meal. 180 tablet 1  . methotrexate (RHEUMATREX) 2.5 MG tablet Take by mouth. Take 1 by mouth every 7 days. 8 pills once a week with a meal.    . Methotrexate, Anti-Rheumatic, (METHOTREXATE, PF, [place]) Inject into the skin.    . Multiple Vitamin (MULTIVITAMIN) tablet Take 1 tablet by mouth daily.    . vitamin C (ASCORBIC ACID) 500 MG tablet Take 500 mg by mouth daily.    . vitamin E 100 UNIT capsule Take 100 Units by mouth daily.    . predniSONE (DELTASONE) 5 MG tablet Take  2.5 mg by mouth daily with breakfast. Reported on 09/25/2015    . atorvastatin (LIPITOR) 40 MG tablet Take 1 tablet (40 mg total) by mouth daily at 6 PM. (Patient taking differently: Take 20 mg by mouth daily at 6 PM. ) 90 tablet 1  . glipiZIDE (GLUCOTROL) 10 MG tablet TAKE 1/2 TABLET BY MOUTH IN THE MORNING AND TAKE 1 TABLET BY MOUTH IN THE EVENING BEFORE MEALS 135 tablet 1   No facility-administered medications prior to visit.    Review of Systems;  Patient denies headache, fevers, malaise, unintentional weight loss, skin rash, eye pain, sinus congestion and sinus pain, sore throat, dysphagia,  hemoptysis , cough, dyspnea, wheezing, chest pain, palpitations, orthopnea, edema, abdominal pain, nausea, melena, diarrhea, constipation, flank pain, dysuria, hematuria, urinary  Frequency, nocturia, numbness, tingling, seizures,  Focal weakness, Loss of consciousness,  Tremor, insomnia, depression, anxiety, and suicidal ideation.      Objective:  BP 128/80 mmHg  Pulse 81  Temp(Src) 97.6 F (36.4 C) (Oral)  Resp 12  Ht '5\' 6"'$  (1.676 m)  Wt 180 lb 8 oz (81.874 kg)  BMI 29.15 kg/m2  SpO2 97%  BP Readings from Last 3 Encounters:  09/25/15 128/80  06/28/15 110/68  04/30/15 118/75    Wt Readings from Last 3 Encounters:  09/25/15 180 lb 8 oz (81.874 kg)  06/28/15 185 lb 8 oz (84.142 kg)  04/30/15 180 lb (81.647 kg)    General appearance: alert, cooperative and appears stated age Ears: normal TM's and external ear canals both ears Throat: lips, mucosa, and tongue normal; teeth and gums normal Neck: no adenopathy, no carotid bruit, supple, symmetrical, trachea midline and thyroid not enlarged, symmetric, no tenderness/mass/nodules Back: symmetric, no curvature. ROM normal. No CVA tenderness. Lungs: clear to auscultation bilaterally Heart: regular rate and rhythm, S1, S2 normal, no murmur, click, rub or gallop Abdomen: soft, non-tender; bowel sounds normal; no masses,  no  organomegaly Pulses: 2+ and symmetric Skin: Skin color, texture, turgor normal. No rashes or lesions Lymph nodes: Cervical, supraclavicular, and axillary nodes normal.  Lab Results  Component Value Date   HGBA1C 6.7* 09/23/2015   HGBA1C 7.2* 06/21/2015   HGBA1C 7.2* 03/22/2015    Lab Results  Component Value Date   CREATININE 0.63 09/23/2015   CREATININE 0.71 06/21/2015   CREATININE 0.71 03/22/2015    Lab Results  Component Value Date   WBC 6.1 09/25/2015   HGB 12.5 09/25/2015   HCT 38.7 09/25/2015   PLT 311.0 09/25/2015   GLUCOSE 124* 09/23/2015   CHOL 123 09/23/2015   TRIG 57.0 09/23/2015   HDL 52.70 09/23/2015  LDLDIRECT 65.0 09/23/2015   LDLCALC 58 09/23/2015   ALT 14 09/23/2015   AST 22 09/23/2015   NA 138 09/23/2015   K 4.2 09/23/2015   CL 103 09/23/2015   CREATININE 0.63 09/23/2015   BUN 16 09/23/2015   CO2 27 09/23/2015   TSH 2.12 05/14/2014   HGBA1C 6.7* 09/23/2015   MICROALBUR 0.8 06/21/2015    Dg Lumbar Spine Complete  04/01/2015  CLINICAL DATA:  Spinal stenosis, lumbar region, with neurogenic claudication. Patient has had a spinal fusion from L2 through the sacrum. Bilateral sciatica for 6 months. No known injury. EXAM: LUMBAR SPINE - COMPLETE 4+ VIEW COMPARISON:  None. FINDINGS: There are bilateral pedicle screws at L2, L3, L4 and S1. Interconnecting rods connect the pedicle screws. The orthopedic hardware is well-seated and aligned with no evidence of loosening. There is no fracture.  There is no spondylolisthesis. Moderate loss of disc height is noted throughout the visualized spine. There is mature bone graft material along the lateral posterior aspect of the mid to lower lumbar spine. The bones are diffusely demineralized. Soft tissues are unremarkable. IMPRESSION: 1. No fracture or acute finding. 2. Orthopedic hardware is well-seated with no evidence of loosening. 3. Disc degenerative changes and diffuse bony demineralization. Electronically Signed    By: Lajean Manes M.D.   On: 04/01/2015 16:30    Assessment & Plan:   Problem List Items Addressed This Visit    Hyperlipidemia    LDL and triglycerides are at goal on current medications. sHe has no side effects and liver enzymes are normal. No changes today.  Lab Results  Component Value Date   CHOL 123 09/23/2015   HDL 52.70 09/23/2015   LDLCALC 58 09/23/2015   LDLDIRECT 65.0 09/23/2015   TRIG 57.0 09/23/2015   CHOLHDL 2 09/23/2015   Lab Results  Component Value Date   ALT 14 09/23/2015   AST 22 09/23/2015   ALKPHOS 82 09/23/2015   BILITOT 0.9 09/23/2015              Diabetes mellitus with no complication (HCC)    Excellent control on oral medications alone since stopping prednisone; her recent hypoglycemic events suggest need to stop glipizide but she prefers to stop metformin. Foot exam is normal.  Eye exam advised   Lab Results  Component Value Date   HGBA1C 6.7* 09/23/2015         Fatty liver disease, nonalcoholic    Secondary to obesity and diabetes/metabolic syndrome.I have addressed  BMI and recommended a low glycemic index diet utilizing smaller more frequent meals to increase metabolism.Her participation in regular  Exercise is ongoing  But limited by left shoulder pain .  Lab Results  Component Value Date   ALT 14 09/23/2015   AST 22 09/23/2015   ALKPHOS 82 09/23/2015   BILITOT 0.9 09/23/2015   Antibody Titers to Hep a and B were drawn today and were negative.  Will recommend revaccination          Relevant Orders   Hepatitis A Ab, Total (Completed)   Hepatitis B Surface AntiBODY (Completed)   Long-term use of high-risk medication - Primary    Liver enzymes and B12 level was normal today      Relevant Orders   Vitamin B12 (Completed)   CBC with Differential/Platelet (Completed)   Folate RBC (Completed)   Pain in joint, shoulder region    Recommend aleve q 12 hours and prn tramadol.  Continue PPI  Fatigue   Relevant Orders    Iron and TIBC (Completed)    Other Visit Diagnoses    Pain in joint involving multiple sites        Relevant Orders    Sedimentation rate (Completed)    C-reactive protein (Completed)       I am having Ms. Ryther start on traMADol. I am also having her maintain her fish oil-omega-3 fatty acids, vitamin C, vitamin E, multivitamin, aspirin, calcium-vitamin D, predniSONE, blood glucose meter kit and supplies, methotrexate, glipiZIDE, (Methotrexate, Anti-Rheumatic, (METHOTREXATE, PF, [place])), glucose blood, losartan, bisacodyl, metFORMIN, atorvastatin, and famotidine.  Meds ordered this encounter  Medications  . traMADol (ULTRAM) 50 MG tablet    Sig: Take 1 tablet (50 mg total) by mouth every 12 (twelve) hours as needed.    Dispense:  60 tablet    Refill:  3    Medications Discontinued During This Encounter  Medication Reason  . atorvastatin (LIPITOR) 40 MG tablet Change in therapy  . glipiZIDE (GLUCOTROL) 10 MG tablet Change in therapy    Follow-up: No Follow-up on file.   Crecencio Mc, MD

## 2015-09-25 NOTE — Patient Instructions (Addendum)
You can use 2 aleve every 12 hours and add tramadol  50 mg twice daily as needed (NO TYLENOL BECAUSE OF FATTY LIVER)   We are checking your ESR today   We will check your antibodies to Hep A and B.  If negative, I advise you to get the Hep a and B vaccine series here in the office   We will check iron and b12 levels today   Suspend metformin  for one week..  Check sugars once or twice daily  .  Continue glipizide,   Send me sugars in 1-2 weeks .  We will Resume  Metformin at 1/2 tablet twice daily if your blood sugars are persistently > 130 in the am or > 160 after meals (2 hrs )

## 2015-09-25 NOTE — Progress Notes (Signed)
Pre-visit discussion using our clinic review tool. No additional management support is needed unless otherwise documented below in the visit note.  

## 2015-09-26 LAB — HEPATITIS B SURFACE ANTIBODY,QUALITATIVE: Hep B S Ab: NEGATIVE

## 2015-09-26 LAB — FOLATE RBC: RBC Folate: 989 ng/mL (ref >280)

## 2015-09-26 LAB — HEPATITIS A ANTIBODY, TOTAL: Hep A Total Ab: NONREACTIVE

## 2015-09-28 ENCOUNTER — Encounter: Payer: Self-pay | Admitting: Internal Medicine

## 2015-09-28 DIAGNOSIS — M25519 Pain in unspecified shoulder: Secondary | ICD-10-CM | POA: Insufficient documentation

## 2015-09-28 NOTE — Assessment & Plan Note (Addendum)
Secondary to obesity and diabetes/metabolic syndrome.I have addressed  BMI and recommended a low glycemic index diet utilizing smaller more frequent meals to increase metabolism.Her participation in regular  Exercise is ongoing  But limited by left shoulder pain .  Lab Results  Component Value Date   ALT 14 09/23/2015   AST 22 09/23/2015   ALKPHOS 82 09/23/2015   BILITOT 0.9 09/23/2015   Antibody Titers to Hep a and B were drawn today and were negative.  Will recommend revaccination

## 2015-09-28 NOTE — Assessment & Plan Note (Signed)
LDL and triglycerides are at goal on current medications. sHe has no side effects and liver enzymes are normal. No changes today.  Lab Results  Component Value Date   CHOL 123 09/23/2015   HDL 52.70 09/23/2015   LDLCALC 58 09/23/2015   LDLDIRECT 65.0 09/23/2015   TRIG 57.0 09/23/2015   CHOLHDL 2 09/23/2015   Lab Results  Component Value Date   ALT 14 09/23/2015   AST 22 09/23/2015   ALKPHOS 82 09/23/2015   BILITOT 0.9 09/23/2015

## 2015-09-28 NOTE — Assessment & Plan Note (Signed)
Excellent control on oral medications alone since stopping prednisone; her recent hypoglycemic events suggest need to stop glipizide but she prefers to stop metformin. Foot exam is normal.  Eye exam advised   Lab Results  Component Value Date   HGBA1C 6.7* 09/23/2015

## 2015-09-28 NOTE — Assessment & Plan Note (Signed)
Recurrence of joint pain concerning for RA flare despite use of MTX. However her ESR and CRP are normal, suggesting OA   Will resume Aleve  Lab Results  Component Value Date   ESRSEDRATE 18 09/25/2015   Lab Results  Component Value Date   CRP 0.2* 09/25/2015    

## 2015-09-28 NOTE — Assessment & Plan Note (Signed)
Recommend aleve q 12 hours and prn tramadol.  Continue PPI

## 2015-09-28 NOTE — Assessment & Plan Note (Signed)
Liver enzymes and B12 level was normal today

## 2015-10-10 ENCOUNTER — Telehealth: Payer: Self-pay | Admitting: Internal Medicine

## 2015-10-10 NOTE — Telephone Encounter (Signed)
Pt came in and dropped off her blood sugar readings.. Placed in Dr. Demetrios Isaacs Box.Marland Kitchen

## 2015-10-11 NOTE — Telephone Encounter (Signed)
In yellow folder

## 2015-10-15 ENCOUNTER — Other Ambulatory Visit: Payer: Self-pay | Admitting: Internal Medicine

## 2015-10-15 ENCOUNTER — Telehealth: Payer: Self-pay | Admitting: Internal Medicine

## 2015-10-15 NOTE — Telephone Encounter (Signed)
Left message for patient to call office.  

## 2015-10-15 NOTE — Telephone Encounter (Signed)
Please call patient around 4:30pm

## 2015-10-15 NOTE — Telephone Encounter (Signed)
[name] does not read her e mails and prefers phone correspondence:   1) Thank you for the blood sugar readings AND THE WHISPS!  WHAT A TREAT!!!!  2) Regarding your labs from may 10, you were sent a e mail that you have not read. (Please read her it)   Your BS are still dropping too often on the glipizide .  Please re  confirm your dose so I can reduce it by half,

## 2015-10-16 NOTE — Telephone Encounter (Signed)
Have her reduce dose to 5 mg twice daily before breakfast and dinner .  Break tablet in half

## 2015-10-16 NOTE — Telephone Encounter (Signed)
Patient notified and voiced understanding.

## 2015-10-16 NOTE — Telephone Encounter (Signed)
Patient taking 1 10 mg glipizide per day and no other diabetic medications. Patient voiced understanding to lab results, from My chart.

## 2015-10-19 ENCOUNTER — Telehealth: Payer: Self-pay

## 2015-10-19 NOTE — Telephone Encounter (Signed)
Patient is on the list for [employer] 2017 and may be a good candidate for an AWV in 2017. Please let me know if/when appt is scheduled.   

## 2015-10-21 NOTE — Telephone Encounter (Signed)
Thank you. Will continue to follow as appropriate.

## 2015-10-30 NOTE — Telephone Encounter (Signed)
Mailed unread message to patient, thanks 

## 2015-11-22 NOTE — Telephone Encounter (Signed)
Appointment scheduled after follow up with PCP at 11:00.

## 2015-12-24 ENCOUNTER — Other Ambulatory Visit: Payer: Medicare Other

## 2015-12-26 ENCOUNTER — Ambulatory Visit: Payer: Medicare Other | Admitting: Internal Medicine

## 2015-12-27 ENCOUNTER — Telehealth: Payer: Self-pay

## 2015-12-27 DIAGNOSIS — D509 Iron deficiency anemia, unspecified: Secondary | ICD-10-CM

## 2015-12-27 DIAGNOSIS — I10 Essential (primary) hypertension: Secondary | ICD-10-CM

## 2015-12-27 DIAGNOSIS — E785 Hyperlipidemia, unspecified: Secondary | ICD-10-CM

## 2015-12-27 DIAGNOSIS — E119 Type 2 diabetes mellitus without complications: Secondary | ICD-10-CM

## 2015-12-27 DIAGNOSIS — K760 Fatty (change of) liver, not elsewhere classified: Secondary | ICD-10-CM

## 2015-12-27 NOTE — Telephone Encounter (Signed)
Pt coming for labs 12/30/15. Please place future orders. Thank you.

## 2015-12-30 ENCOUNTER — Other Ambulatory Visit (INDEPENDENT_AMBULATORY_CARE_PROVIDER_SITE_OTHER): Payer: Medicare Other

## 2015-12-30 DIAGNOSIS — I10 Essential (primary) hypertension: Secondary | ICD-10-CM | POA: Diagnosis not present

## 2015-12-30 DIAGNOSIS — E119 Type 2 diabetes mellitus without complications: Secondary | ICD-10-CM | POA: Diagnosis not present

## 2015-12-30 DIAGNOSIS — K760 Fatty (change of) liver, not elsewhere classified: Secondary | ICD-10-CM

## 2015-12-30 LAB — COMPREHENSIVE METABOLIC PANEL
ALT: 16 U/L (ref 0–35)
AST: 25 U/L (ref 0–37)
Albumin: 4.1 g/dL (ref 3.5–5.2)
Alkaline Phosphatase: 108 U/L (ref 39–117)
BUN: 16 mg/dL (ref 6–23)
CO2: 24 mEq/L (ref 19–32)
Calcium: 9.3 mg/dL (ref 8.4–10.5)
Chloride: 106 mEq/L (ref 96–112)
Creatinine, Ser: 0.66 mg/dL (ref 0.40–1.20)
GFR: 94.43 mL/min (ref >60.00)
Glucose, Bld: 171 mg/dL — ABNORMAL HIGH (ref 70–99)
Potassium: 4.3 mEq/L (ref 3.5–5.1)
Sodium: 138 mEq/L (ref 135–145)
Total Bilirubin: 0.8 mg/dL (ref 0.2–1.2)
Total Protein: 6.5 g/dL (ref 6.0–8.3)

## 2015-12-30 LAB — CBC WITH DIFFERENTIAL/PLATELET
Basophils Absolute: 0 10*3/uL (ref 0.0–0.1)
Basophils Relative: 0.6 % (ref 0.0–3.0)
Eosinophils Absolute: 0.1 10*3/uL (ref 0.0–0.7)
Eosinophils Relative: 2.5 % (ref 0.0–5.0)
HCT: 41.4 % (ref 36.0–46.0)
Hemoglobin: 13.5 g/dL (ref 12.0–15.0)
Lymphocytes Relative: 22.2 % (ref 12.0–46.0)
Lymphs Abs: 1.1 10*3/uL (ref 0.7–4.0)
MCHC: 32.7 g/dL (ref 30.0–36.0)
MCV: 88.2 fl (ref 78.0–100.0)
Monocytes Absolute: 0.3 10*3/uL (ref 0.1–1.0)
Monocytes Relative: 5.5 % (ref 3.0–12.0)
Neutro Abs: 3.5 10*3/uL (ref 1.4–7.7)
Neutrophils Relative %: 69.2 % (ref 43.0–77.0)
Platelets: 260 10*3/uL (ref 150.0–400.0)
RBC: 4.69 Mil/uL (ref 3.87–5.11)
RDW: 18.4 % — ABNORMAL HIGH (ref 11.5–15.5)
WBC: 5.1 10*3/uL (ref 4.0–10.5)

## 2015-12-30 LAB — LIPID PANEL
Cholesterol: 136 mg/dL (ref 0–200)
HDL: 49.9 mg/dL (ref >39.00)
LDL Cholesterol: 48 mg/dL (ref 0–99)
NonHDL: 86.43
Total CHOL/HDL Ratio: 3
Triglycerides: 190 mg/dL — ABNORMAL HIGH (ref 0.0–149.0)
VLDL: 38 mg/dL (ref 0.0–40.0)

## 2015-12-30 LAB — MICROALBUMIN / CREATININE URINE RATIO
Creatinine,U: 69.9 mg/dL
Microalb Creat Ratio: 1 mg/g (ref 0.0–30.0)
Microalb, Ur: <0.7 mg/dL (ref 0.0–1.9)

## 2015-12-30 LAB — HEMOGLOBIN A1C: Hgb A1c MFr Bld: 6.9 % — ABNORMAL HIGH (ref 4.6–6.5)

## 2015-12-31 LAB — HM DIABETES EYE EXAM

## 2016-01-01 ENCOUNTER — Encounter: Payer: Self-pay | Admitting: Internal Medicine

## 2016-01-01 ENCOUNTER — Other Ambulatory Visit: Payer: Self-pay | Admitting: Internal Medicine

## 2016-01-01 ENCOUNTER — Ambulatory Visit (INDEPENDENT_AMBULATORY_CARE_PROVIDER_SITE_OTHER): Payer: Medicare Other | Admitting: Internal Medicine

## 2016-01-01 VITALS — BP 138/84 | HR 73 | Temp 97.6°F | Ht 66.0 in | Wt 183.5 lb

## 2016-01-01 DIAGNOSIS — Z0000 Encounter for general adult medical examination without abnormal findings: Secondary | ICD-10-CM | POA: Diagnosis not present

## 2016-01-01 DIAGNOSIS — I10 Essential (primary) hypertension: Secondary | ICD-10-CM | POA: Diagnosis not present

## 2016-01-01 DIAGNOSIS — Z1239 Encounter for other screening for malignant neoplasm of breast: Secondary | ICD-10-CM | POA: Diagnosis not present

## 2016-01-01 DIAGNOSIS — E119 Type 2 diabetes mellitus without complications: Secondary | ICD-10-CM

## 2016-01-01 DIAGNOSIS — Z23 Encounter for immunization: Secondary | ICD-10-CM | POA: Diagnosis not present

## 2016-01-01 DIAGNOSIS — K760 Fatty (change of) liver, not elsewhere classified: Secondary | ICD-10-CM | POA: Diagnosis not present

## 2016-01-01 MED ORDER — GLIPIZIDE 5 MG PO TABS: 5.0000 mg | ORAL_TABLET | Freq: Two times a day (BID) | ORAL | 1 refills | Status: DC

## 2016-01-01 MED ORDER — FAMOTIDINE 40 MG PO TABS: 40.0000 mg | ORAL_TABLET | Freq: Every day | ORAL | 1 refills | Status: DC

## 2016-01-01 NOTE — Progress Notes (Signed)
Pre-visit discussion using our clinic review tool. No additional management support is needed unless otherwise documented below in the visit note.  

## 2016-01-01 NOTE — Progress Notes (Signed)
Subjective:   [name] is a 70 y.o. female who presents for Medicare Annual (Subsequent) preventive examination.  Review of Systems:  No ROS.  Medicare Wellness Visit.  Cardiac Risk Factors include: advanced age (>27mn, >>15women);hypertension;diabetes mellitus     Objective:     Vitals: BP 138/84   Pulse 73   Temp 97.6 F (36.4 C) (Oral)   Ht 5' 6"  (1.676 m)   Wt 183 lb 8 oz (83.2 kg)   SpO2 98%   BMI 29.62 kg/m   Body mass index is 29.62 kg/m.   Tobacco History  Smoking Status  . Never Smoker  Smokeless Tobacco  . Never Used     Counseling given: Not Answered   Past Medical History:  Diagnosis Date  . Diabetes mellitus   . Gastritis   . High cholesterol   . Hyperlipidemia    Lipitor trial was a failure  . Hypertension    Past Surgical History:  Procedure Laterality Date  . CARDIAC CATHETERIZATION  Augus 2007   normal coronaries,  EF 60%  . SArpin . SPINE SURGERY     bone graft   . TUBAL LIGATION     Family History  Problem Relation Age of Onset  . Diabetes Mother   . Heart disease Father   . Stroke Father   . Diabetes Father   . Diabetes Maternal Grandfather   . Diabetes Brother   . Diabetes Brother    History  Sexual Activity  . Sexual activity: Yes  . Partners: Male  . Birth control/ protection: None    Outpatient Encounter Prescriptions as of 01/01/2016  Medication Sig  . aspirin 81 MG tablet Take 1 tablet (81 mg total) by mouth daily.  .Marland Kitchenatorvastatin (LIPITOR) 80 MG tablet TAKE 1 TABLET (80 MG TOTAL) BY MOUTH DAILY. (Patient taking differently: TAKE 0.5 TABLET (40 MG TOTAL) BY MOUTH DAILY.)  . bisacodyl (DULCOLAX) 5 MG EC tablet Take 5 mg by mouth daily as needed for moderate constipation.  . blood glucose meter kit and supplies KIT Dispense based on patient and insurance preference. Use up to four times daily as directed. (FOR ICD-9 250.00, 250.01).  . calcium-vitamin D (OSCAL WITH D) 250-125 MG-UNIT per tablet  Take 2 tablets by mouth daily.  . famotidine (PEPCID) 40 MG tablet Take 1 tablet (40 mg total) by mouth daily.  . fish oil-omega-3 fatty acids 1000 MG capsule Take 2 g by mouth 2 (two) times daily.    .Marland KitchenglipiZIDE (GLUCOTROL) 5 MG tablet Take 1 tablet (5 mg total) by mouth 2 (two) times daily before a meal. 10 mg in Am and 5 mg in PM before meals  . glucose blood (ACCU-CHEK AVIVA PLUS) test strip test CBG three times daily,  E11.65  . losartan (COZAAR) 50 MG tablet TAKE 1 TABLET (50 MG TOTAL) BY MOUTH DAILY.  . methotrexate (RHEUMATREX) 2.5 MG tablet Take by mouth. Take 1 by mouth every 7 days. 8 pills once a week with a meal.  . Multiple Vitamin (MULTIVITAMIN) tablet Take 1 tablet by mouth daily.  . vitamin C (ASCORBIC ACID) 500 MG tablet Take 500 mg by mouth daily.  . vitamin E 100 UNIT capsule Take 100 Units by mouth daily.  . [DISCONTINUED] famotidine (PEPCID) 40 MG tablet TAKE 1 TABLET (40 MG TOTAL) BY MOUTH DAILY.  . [DISCONTINUED] glipiZIDE (GLUCOTROL) 10 MG tablet 10 mg in Am and 5 mg in PM before meals (Patient taking  differently: Take 10 mg by mouth daily before breakfast. 10 mg in Am and 5 mg in PM before meals)  . metFORMIN (GLUCOPHAGE) 1000 MG tablet Take 1 tablet (1,000 mg total) by mouth 2 (two) times daily with a meal. (Patient not taking: Reported on 01/01/2016)  . Methotrexate, Anti-Rheumatic, (METHOTREXATE, PF, [place]) Inject into the skin.  . predniSONE (DELTASONE) 5 MG tablet Take 2.5 mg by mouth daily with breakfast. Reported on 09/25/2015  . traMADol (ULTRAM) 50 MG tablet Take 1 tablet (50 mg total) by mouth every 12 (twelve) hours as needed. (Patient not taking: Reported on 01/01/2016)   No facility-administered encounter medications on file as of 01/01/2016.     Activities of Daily Living In your present state of health, do you have any difficulty performing the following activities: 01/01/2016  Hearing? N  Vision? N  Difficulty concentrating or making decisions? N  Walking or  climbing stairs? N  Dressing or bathing? N  Doing errands, shopping? N  Preparing Food and eating ? N  Using the Toilet? N  In the past six months, have you accidently leaked urine? N  Do you have problems with loss of bowel control? N  Managing your Medications? N  Managing your Finances? N  Housekeeping or managing your Housekeeping? N  Some recent data might be hidden    Patient Care Team: Crecencio Mc, MD as PCP - General (Internal Medicine) Julieanne Manson Leeanne Mannan., MD (Rheumatology) Osborne Oman, MD as Consulting Physician (Obstetrics and Gynecology)    Assessment:    This is a routine wellness examination for [name]. The goal of the wellness visit is to assist the patient how to close the gaps in care and create a preventative care plan for the patient.   Taking calcium VIT D as appropriate/Osteoporosis risk reviewed.  Medications reviewed; taking without issues or barriers.  Safety issues reviewed; smoke and carbon monoxide detectors in the home. No firearms in the home. Wears seatbelts when driving or riding with others. No violence in the home.  No identified risk were noted; The patient was oriented x 3; appropriate in dress and manner and no objective failures at ADL's or IADL's.   Body mass index; discussed the importance of a healthy diet, water intake and exercise. Educational material provided.  Hepatitis C Screening postponed, per patient request.  Follow up with PCP.  Neurosurgeon given.  Health maintenance gap closed.   Patient Concerns: None at this time. Follow up with PCP as needed.  Exercise Activities and Dietary recommendations Current Exercise Habits: Structured exercise class, Type of exercise: stretching;calisthenics;strength training/weights;treadmill, Frequency (Times/Week): 5, Intensity: Moderate  Goals    . Peak Blood Glucose < 180      Fall Risk Fall Risk  01/01/2016 09/12/2014 11/08/2013 06/14/2012  Falls in the past  year? No No No No   Depression Screen PHQ 2/9 Scores 01/01/2016 09/12/2014 11/08/2013 06/14/2012  PHQ - 2 Score 0 1 0 0     Cognitive Testing MMSE - Mini Mental State Exam 01/01/2016 09/12/2014  Orientation to time 5 5  Orientation to Place 5 5  Registration 3 3  Attention/ Calculation 5 5  Recall 3 3  Language- name 2 objects 2 2  Language- repeat 1 1  Language- follow 3 step command 3 3  Language- read & follow direction 1 1  Write a sentence 1 1  Copy design 1 1  Total score 30 30    Immunization History  Administered Date(s) Administered  .  Hep A / Hep B 01/01/2016  . Influenza Split 03/03/2012, 01/26/2013, 02/25/2015  . Influenza,inj,Quad PF,36+ Mos 02/13/2014  . Pneumococcal Conjugate-13 11/08/2013  . Pneumococcal Polysaccharide-23 03/03/2012  . Tdap 08/08/2013  . Zoster 04/20/2008   Screening Tests Health Maintenance  Topic Date Due  . OPHTHALMOLOGY EXAM  12/13/2015  . INFLUENZA VACCINE  12/17/2015  . Hepatitis C Screening  12/31/2016 (Originally [birth year])  . HEMOGLOBIN A1C  07/01/2016  . FOOT EXAM  09/24/2016  . MAMMOGRAM  03/24/2017  . TETANUS/TDAP  08/09/2023  . COLONOSCOPY  02/03/2024  . DEXA SCAN  Completed  . ZOSTAVAX  Completed  . PNA vac Low Risk Adult  Completed      Plan:   End of life planning; Advance aging; Advanced directives discussed. Currently discussing HCPOA/Living Will with family.  Copy requested upon completion.  During the course of the visit the patient was educated and counseled about the following appropriate screening and preventive services:   Vaccines to include Pneumoccal, Influenza, Hepatitis B, Td, Zostavax, HCV  Electrocardiogram  Cardiovascular Disease  Colorectal cancer screening  Bone density screening  Diabetes screening  Glaucoma screening  Mammography/PAP  Nutrition counseling   Patient Instructions (the written plan) was given to the patient.   Varney Biles, LPN  [id number]

## 2016-01-01 NOTE — Progress Notes (Signed)
Subjective:  Patient ID: [name], female    DOB: 1945/12/18  Age: 70 y.o. MRN: [id number]  CC: The primary encounter diagnosis was Medicare annual wellness, subsequent. Diagnoses of Fatty liver disease, nonalcoholic, Diabetes mellitus with no complication ([place]), Essential hypertension, and Breast cancer screening were also pertinent to this visit.  HPI [name] presents for 3 month follow up on Type 2 DM, recent diagnosis,  Hypertension, hyperlipidemia and obesity.    She has been keeping her carbohydrate content well under the recommended  45 carbs per meal by eliminiating sweetened tea and soda and cutting out bagels for breakfast.  She is eating oatmeal prepared from  steel cut oatsfor breakfast instead and  checks her sugars once daily in the morning, usually.   cbgs have been consistently under 130.     Taking metformin and glipizide,  Glipizide  dose 5 mg twice daily .  Sugars frequently drop before lunch . Lab Results  Component Value Date   HGBA1C 6.9 (H) 12/30/2015   Wakes up feeling "terrible" 4 nights per week.  Feels exhausted,  Husband says she snores,  Wants to get a mouth guard  From dentist to help stop snoring   Outpatient Medications Prior to Visit  Medication Sig Dispense Refill  . aspirin 81 MG tablet Take 1 tablet (81 mg total) by mouth daily. 30 tablet 2  . atorvastatin (LIPITOR) 80 MG tablet TAKE 1 TABLET (80 MG TOTAL) BY MOUTH DAILY. (Patient taking differently: TAKE 0.5 TABLET (40 MG TOTAL) BY MOUTH DAILY.) 90 tablet 1  . bisacodyl (DULCOLAX) 5 MG EC tablet Take 5 mg by mouth daily as needed for moderate constipation.    . blood glucose meter kit and supplies KIT Dispense based on patient and insurance preference. Use up to four times daily as directed. (FOR ICD-9 250.00, 250.01). 1 each 0  . calcium-vitamin D (OSCAL WITH D) 250-125 MG-UNIT per tablet Take 2 tablets by mouth daily.    . fish oil-omega-3 fatty acids 1000 MG capsule Take 2 g by mouth 2 (two)  times daily.      Marland Kitchen glucose blood (ACCU-CHEK AVIVA PLUS) test strip test CBG three times daily,  E11.65 300 each 3  . losartan (COZAAR) 50 MG tablet TAKE 1 TABLET (50 MG TOTAL) BY MOUTH DAILY. 90 tablet 1  . methotrexate (RHEUMATREX) 2.5 MG tablet Take by mouth. Take 1 by mouth every 7 days. 8 pills once a week with a meal.    . Multiple Vitamin (MULTIVITAMIN) tablet Take 1 tablet by mouth daily.    . vitamin C (ASCORBIC ACID) 500 MG tablet Take 500 mg by mouth daily.    . vitamin E 100 UNIT capsule Take 100 Units by mouth daily.    . famotidine (PEPCID) 40 MG tablet TAKE 1 TABLET (40 MG TOTAL) BY MOUTH DAILY. 90 tablet 1  . glipiZIDE (GLUCOTROL) 10 MG tablet 10 mg in Am and 5 mg in PM before meals (Patient taking differently: Take 10 mg by mouth daily before breakfast. 10 mg in Am and 5 mg in PM before meals) 135 tablet 1  . metFORMIN (GLUCOPHAGE) 1000 MG tablet Take 1 tablet (1,000 mg total) by mouth 2 (two) times daily with a meal. (Patient not taking: Reported on 01/01/2016) 180 tablet 1  . Methotrexate, Anti-Rheumatic, (METHOTREXATE, PF, [place]) Inject into the skin.    . predniSONE (DELTASONE) 5 MG tablet Take 2.5 mg by mouth daily with breakfast. Reported on 09/25/2015    .  traMADol (ULTRAM) 50 MG tablet Take 1 tablet (50 mg total) by mouth every 12 (twelve) hours as needed. (Patient not taking: Reported on 01/01/2016) 60 tablet 3   No facility-administered medications prior to visit.     Review of Systems;  Patient denies headache, fevers, malaise, unintentional weight loss, skin rash, eye pain, sinus congestion and sinus pain, sore throat, dysphagia,  hemoptysis , cough, dyspnea, wheezing, chest pain, palpitations, orthopnea, edema, abdominal pain, nausea, melena, diarrhea, constipation, flank pain, dysuria, hematuria, urinary  Frequency, nocturia, numbness, tingling, seizures,  Focal weakness, Loss of consciousness,  Tremor, insomnia, depression, anxiety, and suicidal ideation.       Objective:  BP 138/84   Pulse 73   Temp 97.6 F (36.4 C) (Oral)   Ht 5' 6"  (1.676 m)   Wt 183 lb 8 oz (83.2 kg)   SpO2 98%   BMI 29.62 kg/m   BP Readings from Last 3 Encounters:  01/01/16 138/84  09/25/15 128/80  06/28/15 110/68    Wt Readings from Last 3 Encounters:  01/01/16 183 lb 8 oz (83.2 kg)  09/25/15 180 lb 8 oz (81.9 kg)  06/28/15 185 lb 8 oz (84.1 kg)    General appearance: alert, cooperative and appears stated age Ears: normal TM's and external ear canals both ears Throat: lips, mucosa, and tongue normal; teeth and gums normal Neck: no adenopathy, no carotid bruit, supple, symmetrical, trachea midline and thyroid not enlarged, symmetric, no tenderness/mass/nodules Back: symmetric, no curvature. ROM normal. No CVA tenderness. Lungs: clear to auscultation bilaterally Heart: regular rate and rhythm, S1, S2 normal, no murmur, click, rub or gallop Abdomen: soft, non-tender; bowel sounds normal; no masses,  no organomegaly Pulses: 2+ and symmetric Skin: Skin color, texture, turgor normal. No rashes or lesions Lymph nodes: Cervical, supraclavicular, and axillary nodes normal.  Lab Results  Component Value Date   HGBA1C 6.9 (H) 12/30/2015   HGBA1C 6.7 (H) 09/23/2015   HGBA1C 7.2 (H) 06/21/2015    Lab Results  Component Value Date   CREATININE 0.66 12/30/2015   CREATININE 0.63 09/23/2015   CREATININE 0.71 06/21/2015    Lab Results  Component Value Date   WBC 5.1 12/30/2015   HGB 13.5 12/30/2015   HCT 41.4 12/30/2015   PLT 260.0 12/30/2015   GLUCOSE 171 (H) 12/30/2015   CHOL 136 12/30/2015   TRIG 190.0 (H) 12/30/2015   HDL 49.90 12/30/2015   LDLDIRECT 65.0 09/23/2015   LDLCALC 48 12/30/2015   ALT 16 12/30/2015   AST 25 12/30/2015   NA 138 12/30/2015   K 4.3 12/30/2015   CL 106 12/30/2015   CREATININE 0.66 12/30/2015   BUN 16 12/30/2015   CO2 24 12/30/2015   TSH 2.12 05/14/2014   HGBA1C 6.9 (H) 12/30/2015   MICROALBUR <0.7 12/30/2015     Dg Lumbar Spine Complete  Result Date: 04/01/2015 CLINICAL DATA:  Spinal stenosis, lumbar region, with neurogenic claudication. Patient has had a spinal fusion from L2 through the sacrum. Bilateral sciatica for 6 months. No known injury. EXAM: LUMBAR SPINE - COMPLETE 4+ VIEW COMPARISON:  None. FINDINGS: There are bilateral pedicle screws at L2, L3, L4 and S1. Interconnecting rods connect the pedicle screws. The orthopedic hardware is well-seated and aligned with no evidence of loosening. There is no fracture.  There is no spondylolisthesis. Moderate loss of disc height is noted throughout the visualized spine. There is mature bone graft material along the lateral posterior aspect of the mid to lower lumbar spine. The bones are diffusely  demineralized. Soft tissues are unremarkable. IMPRESSION: 1. No fracture or acute finding. 2. Orthopedic hardware is well-seated with no evidence of loosening. 3. Disc degenerative changes and diffuse bony demineralization. Electronically Signed   By: Lajean Manes M.D.   On: 04/01/2015 16:30    Assessment & Plan:   Problem List Items Addressed This Visit    Diabetes mellitus with no complication ([place])    Excellent control on oral medications alone since stopping prednisone; her recent hypoglycemic events in early afternoon were addressed by reducing her morning dose of glipizide to 2.5 mg  .  exam is normal.  Eye exam advised   Lab Results  Component Value Date   HGBA1C 6.9 (H) 12/30/2015         Hypertension    Well controlled on current regimen. Renal function stable, no changes today.  Lab Results  Component Value Date   CREATININE 0.66 12/30/2015   Lab Results  Component Value Date   NA 138 12/30/2015   K 4.3 12/30/2015   CL 106 12/30/2015   CO2 24 12/30/2015         Fatty liver disease, nonalcoholic   Relevant Orders   Hepatitis A hepatitis B combined vaccine IM (Completed)    Other Visit Diagnoses    Medicare annual wellness,  subsequent    -  Primary   Breast cancer screening       Relevant Orders   MM DIGITAL SCREENING BILATERAL      I have discontinued Ms. Mackert glipiZIDE. I am also having her maintain her fish oil-omega-3 fatty acids, vitamin C, vitamin E, multivitamin, aspirin, calcium-vitamin D, predniSONE, blood glucose meter kit and supplies, methotrexate, (Methotrexate, Anti-Rheumatic, (METHOTREXATE, PF, [place])), glucose blood, bisacodyl, metFORMIN, atorvastatin, traMADol, losartan, and famotidine.  Meds ordered this encounter  Medications  . famotidine (PEPCID) 40 MG tablet    Sig: Take 1 tablet (40 mg total) by mouth daily.    Dispense:  90 tablet    Refill:  1  . DISCONTD: glipiZIDE (GLUCOTROL) 5 MG tablet    Sig: Take 1 tablet (5 mg total) by mouth 2 (two) times daily before a meal. 10 mg in Am and 5 mg in PM before meals    Dispense:  180 tablet    Refill:  1    PHARMACY NOTE CHANGE IN DOSE    Medications Discontinued During This Encounter  Medication Reason  . famotidine (PEPCID) 40 MG tablet Reorder  . glipiZIDE (GLUCOTROL) 10 MG tablet Reorder    Follow-up: Return in about 4 weeks (around 01/29/2016) for Hep A/B vaccine  6 months OV [place] .   Crecencio Mc, MD

## 2016-01-01 NOTE — Patient Instructions (Addendum)
  Ms. Birkeland , Thank you for taking time to come for your Medicare Wellness Visit. I appreciate your ongoing commitment to your health goals. Please review the following plan we discussed and let me know if I can assist you in the future.   These are the goals we discussed: Goals    . Peak Blood Glucose < 180       This is a list of the screening recommended for you and due dates:  Health Maintenance  Topic Date Due  .  Hepatitis C: One time screening is recommended by Center for Disease Control  (CDC) for  adults born from [birth year] through 1964.   1946-01-20  . Eye exam for diabetics  12/13/2015  . Flu Shot  12/17/2015  . Hemoglobin A1C  07/01/2016  . Complete foot exam   09/24/2016  . Mammogram  03/24/2017  . Tetanus Vaccine  08/09/2023  . Colon Cancer Screening  02/03/2024  . DEXA scan (bone density measurement)  Completed  . Shingles Vaccine  Completed  . Pneumonia vaccines  Completed       Your diabetes remains under excellent control currently and your cholesterol is excellent  .    Skip your morning dose of glipzide if you are going to the gym that day Test your sugar 2 hours after lunch to see if skipping the glipizide has  Resulted in loss of control   If your sugars after lunch is > 160,  We will add 2.5 mg glipizide at lunchtime (new rx sent for 5 gm tablets)

## 2016-01-02 ENCOUNTER — Telehealth: Payer: Self-pay | Admitting: Internal Medicine

## 2016-01-02 MED ORDER — GLIPIZIDE 5 MG PO TABS: 5.0000 mg | ORAL_TABLET | Freq: Two times a day (BID) | ORAL | 1 refills | Status: DC

## 2016-01-02 NOTE — Telephone Encounter (Signed)
rx revised to read 5 mg twice daily before meals and re sent

## 2016-01-02 NOTE — Telephone Encounter (Signed)
Pharmacy requesting clarification on glipizide sig. Reads two times daily then below reads 10mg  in the am and 5 mg in the PM.

## 2016-01-04 NOTE — Assessment & Plan Note (Addendum)
Excellent control on oral medications alone since stopping prednisone; her recent hypoglycemic events in early afternoon were addressed by reducing her morning dose of glipizide to 2.5 mg  .  exam is normal.  Eye exam advised   Lab Results  Component Value Date   HGBA1C 6.9 (H) 12/30/2015

## 2016-01-04 NOTE — Assessment & Plan Note (Signed)
Well controlled on current regimen. Renal function stable, no changes today.  Lab Results  Component Value Date   CREATININE 0.66 12/30/2015   Lab Results  Component Value Date   NA 138 12/30/2015   K 4.3 12/30/2015   CL 106 12/30/2015   CO2 24 12/30/2015

## 2016-01-13 ENCOUNTER — Encounter: Payer: Self-pay | Admitting: Internal Medicine

## 2016-01-24 ENCOUNTER — Other Ambulatory Visit: Payer: Self-pay | Admitting: Internal Medicine

## 2016-02-04 ENCOUNTER — Ambulatory Visit (INDEPENDENT_AMBULATORY_CARE_PROVIDER_SITE_OTHER): Payer: Medicare Other | Admitting: *Deleted

## 2016-02-04 ENCOUNTER — Encounter (INDEPENDENT_AMBULATORY_CARE_PROVIDER_SITE_OTHER): Payer: Self-pay

## 2016-02-04 DIAGNOSIS — K760 Fatty (change of) liver, not elsewhere classified: Secondary | ICD-10-CM

## 2016-02-04 DIAGNOSIS — Z23 Encounter for immunization: Secondary | ICD-10-CM

## 2016-02-04 NOTE — Progress Notes (Addendum)
  Patient presented today for her second Hepatitis A&B vaccination. Immunization given in her left arm  Patient tolerated well.  Reviewed.   Dr Nicki Reaper

## 2016-02-20 ENCOUNTER — Ambulatory Visit (INDEPENDENT_AMBULATORY_CARE_PROVIDER_SITE_OTHER): Payer: Medicare Other

## 2016-02-20 DIAGNOSIS — Z23 Encounter for immunization: Secondary | ICD-10-CM | POA: Diagnosis not present

## 2016-03-24 ENCOUNTER — Ambulatory Visit (INDEPENDENT_AMBULATORY_CARE_PROVIDER_SITE_OTHER): Payer: Medicare Other | Admitting: Obstetrics & Gynecology

## 2016-03-24 ENCOUNTER — Other Ambulatory Visit (HOSPITAL_COMMUNITY)
Admission: RE | Admit: 2016-03-24 | Discharge: 2016-03-24 | Disposition: A | Discharge status: Home / Self Care | Payer: Medicare Other | Source: Ambulatory Visit | Attending: Obstetrics & Gynecology | Admitting: Obstetrics & Gynecology

## 2016-03-24 ENCOUNTER — Encounter: Payer: Self-pay | Admitting: Obstetrics & Gynecology

## 2016-03-24 ENCOUNTER — Encounter: Payer: Self-pay | Admitting: *Deleted

## 2016-03-24 VITALS — BP 131/81 | HR 73 | Resp 18 | Ht 66.0 in | Wt 181.0 lb

## 2016-03-24 DIAGNOSIS — Z1151 Encounter for screening for human papillomavirus (HPV): Secondary | ICD-10-CM | POA: Diagnosis present

## 2016-03-24 DIAGNOSIS — Z124 Encounter for screening for malignant neoplasm of cervix: Secondary | ICD-10-CM

## 2016-03-24 DIAGNOSIS — Z01419 Encounter for gynecological examination (general) (routine) without abnormal findings: Secondary | ICD-10-CM | POA: Insufficient documentation

## 2016-03-24 DIAGNOSIS — R87810 Cervical high risk human papillomavirus (HPV) DNA test positive: Secondary | ICD-10-CM | POA: Diagnosis present

## 2016-03-24 DIAGNOSIS — Z1239 Encounter for other screening for malignant neoplasm of breast: Secondary | ICD-10-CM

## 2016-03-24 NOTE — Progress Notes (Signed)
GYNECOLOGY ANNUAL PREVENTATIVE CARE ENCOUNTER NOTE  Subjective:   [name] is a 70 y.o. [phone] female here for a routine annual gynecologic exam.  Current complaints: none.   Denies abnormal vaginal bleeding, discharge, pelvic pain, problems with intercourse or other gynecologic concerns.    Gynecologic History No LMP recorded. Patient is postmenopausal. Contraception: post menopausal status Last Pap:  Normal cytology on pap smears, but positive high risk HPV in 02/28/13, 03/08/14 and 03/22/15. Negative for HPV 16 and 18/45 in 2015 and 2016.  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   Last mammogram: 03/25/2015. Results were: normal  Obstetric History OB History  Gravida Para Term Preterm AB Living  2 2 2     2   SAB TAB Ectopic Multiple Live Births               # Outcome Date GA Lbr Len/2nd Weight Sex Delivery Anes PTL Lv  2 Term     M Vag-Spont     1 Term     M Vag-Spont         Past Medical History:  Diagnosis Date  . Diabetes mellitus   . Gastritis   . High cholesterol   . Hyperlipidemia    Lipitor trial was a failure  . Hypertension     Past Surgical History:  Procedure Laterality Date  . CARDIAC CATHETERIZATION  Augus 2007   normal coronaries,  EF 60%  . [place]  . SPINE SURGERY     bone graft   . TUBAL LIGATION      Current Outpatient Prescriptions on File Prior to Visit  Medication Sig Dispense Refill  . ACCU-CHEK AVIVA PLUS test strip TEST CBG THREE TIMES DAILY, E11.65 300 each 12  . aspirin 81 MG tablet Take 1 tablet (81 mg total) by mouth daily. 30 tablet 2  . atorvastatin (LIPITOR) 80 MG tablet TAKE 1 TABLET (80 MG TOTAL) BY MOUTH DAILY. (Patient taking differently: TAKE 0.5 TABLET (40 MG TOTAL) BY MOUTH DAILY.) 90 tablet 1  . bisacodyl (DULCOLAX) 5 MG EC tablet Take 5 mg by mouth daily as needed for moderate constipation.    . blood glucose meter kit and supplies KIT Dispense based on patient and insurance  preference. Use up to four times daily as directed. (FOR ICD-9 250.00, 250.01). 1 each 0  . calcium-vitamin D (OSCAL WITH D) 250-125 MG-UNIT per tablet Take 2 tablets by mouth daily.    . famotidine (PEPCID) 40 MG tablet Take 1 tablet (40 mg total) by mouth daily. 90 tablet 1  . fish oil-omega-3 fatty acids 1000 MG capsule Take 2 g by mouth 2 (two) times daily.      Marland Kitchen glipiZIDE (GLUCOTROL) 5 MG tablet Take 1 tablet (5 mg total) by mouth 2 (two) times daily before a meal. 180 tablet 1  . losartan (COZAAR) 50 MG tablet TAKE 1 TABLET (50 MG TOTAL) BY MOUTH DAILY. 90 tablet 1  . methotrexate (RHEUMATREX) 2.5 MG tablet Take by mouth. Take 1 by mouth every 7 days. 8 pills once a week with a meal.    . Methotrexate, Anti-Rheumatic, (METHOTREXATE, PF, [place]) Inject into the skin.    . Multiple Vitamin (MULTIVITAMIN) tablet Take 1 tablet by mouth daily.    . vitamin C (ASCORBIC ACID) 500 MG tablet Take 500 mg by mouth daily.    . vitamin E 100 UNIT capsule Take 100 Units by mouth daily.    Marland Kitchen  traMADol (ULTRAM) 50 MG tablet Take 1 tablet (50 mg total) by mouth every 12 (twelve) hours as needed. (Patient not taking: Reported on 03/24/2016) 60 tablet 3  . [DISCONTINUED] ferrous sulfate 325 (65 FE) MG tablet Take 325 mg by mouth daily with breakfast.      . [DISCONTINUED] omeprazole (PRILOSEC) 40 MG capsule Take 1 capsule (40 mg total) by mouth daily. 30 capsule 1   No current facility-administered medications on file prior to visit.     Allergies  Allergen Reactions  . Amoxicillin Hives  . Latex Rash    Social History   Social History  . Marital status: Married    Spouse name: N/A  . Number of children: N/A  . Years of education: N/A   Occupational History  . 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 Smoking status: Never Smoker  . Smokeless tobacco: Never Used  . Alcohol use No  . Drug use: No  . Sexual activity: Yes    Partners: Male    Birth control/ protection: None     Other Topics Concern  . Not on file   Social History Narrative  . No narrative on file    Family History  Problem Relation Age of Onset  . Diabetes Mother   . Heart disease Father   . Stroke Father   . Diabetes Father   . Diabetes Maternal Grandfather   . Diabetes Brother   . Diabetes Brother     The following portions of the patient's history were reviewed and updated as appropriate: allergies, current medications, past family history, past medical history, past social history, past surgical history and problem list.  Review of Systems Pertinent items noted in HPI and remainder of comprehensive ROS otherwise negative.   Objective:  BP 131/81 (BP Location: Left Arm, Patient Position: Sitting, Cuff Size: Normal)   Pulse 73   Resp 18   Ht 5' 6"  (1.676 m)   Wt 181 lb (82.1 kg)   BMI 29.21 kg/m  CONSTITUTIONAL: Well-developed, well-nourished female in no acute distress.  HENT:  Normocephalic, atraumatic, External right and left ear normal. Oropharynx is clear and moist EYES: Conjunctivae and EOM are normal. Pupils are equal, round, and reactive to light. No scleral icterus.  NECK: Normal range of motion, supple, no masses.  Normal thyroid.  SKIN: Skin is warm and dry. No rash noted. Not diaphoretic. No erythema. No pallor. NEUROLOGIC: Alert and oriented to person, place, and time. Normal reflexes, muscle tone coordination. No cranial nerve deficit noted. PSYCHIATRIC: Normal mood and affect. Normal behavior. Normal judgment and thought content. CARDIOVASCULAR: Normal heart rate noted, regular rhythm RESPIRATORY: Clear to auscultation bilaterally. Effort and breath sounds normal, no problems with respiration noted. BREASTS: Symmetric in size. No masses, skin changes, nipple drainage, or lymphadenopathy. ABDOMEN: Soft, normal bowel sounds, no distention noted.  No tenderness, rebound or guarding.  PELVIC: Normal appearing external genitalia; normal appearing vaginal mucosa with  moderate atrophy and cervix.  No abnormal discharge noted.  Pap smear obtained.  Normal uterine size, no other palpable masses, no uterine or adnexal tenderness. MUSCULOSKELETAL: Normal range of motion. No tenderness.  No cyanosis, clubbing, or edema.  2+ distal pulses.   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 If HPV still persistent, will consider cryotherapy or other management after repeating colposcopy.  Mammogram  scheduled Routine preventative health maintenance measures emphasized. Please refer to After Visit Summary for other counseling recommendations.    Verita Schneiders, MD, [place] Attending Obstetrician & Gynecologist, [place] for [facility]

## 2016-03-24 NOTE — Patient Instructions (Signed)
Return to clinic for any scheduled appointments or for any gynecologic concerns as needed.   Human Papillomavirus Human papillomavirus (HPV) is the most common sexually transmitted infection (STI) and is highly contagious. HPV infections cause genital warts and cancers to the outlet of the womb (cervix), birth canal (vagina), opening of the birth canal (vulva), and anus. There are over 100 types of HPV. Unless wartlike lesions are present in the throat or there are genital warts that you can see or feel, HPV usually does not cause symptoms. It is possible to be infected for long periods and pass it on to others without knowing it. CAUSES  HPV is spread from person to person through sexual contact. This includes oral, vaginal, or anal sex. RISK FACTORS  Having unprotected sex. HPV can be spread by oral, vaginal, or anal sex.  Having several sex partners.  Having a sex partner who has other sex partners.  Having or having had another sexually transmitted infection. SIGNS AND SYMPTOMS  Most people carrying HPV do not have any symptoms. If symptoms are present, symptoms may include:  Wartlike lesions in the throat (from having oral sex).  Warts in the infected skin or mucous membranes.  Genital warts that may itch, burn, or bleed.  Genital warts that may be painful or bleed during sexual intercourse. DIAGNOSIS  If wartlike lesions are present in the throat or genital warts are present, your health care provider can usually diagnose HPV by physical examination.   Genital warts are easily seen with the naked eye.  Currently, there is no FDA-approved test to detect HPV in males.  In females, a Pap test can show cells that are infected with HPV.  In females, a scope can be used to view the cervix (colposcopy). A colposcopy can be performed if the pelvic exam or Pap test is abnormal. A sample of tissue may be removed (biopsy) during the colposcopy. TREATMENT  There is no treatment for the  virus itself. However, there are treatments for the health problems and symptoms HPV can cause. Your health care provider will follow you closely after you are treated. This is because the HPV can come back and may need treatment again. Treatment of HPV may include:   Medicines, which may be injected or applied in a cream, lotion, or gel form.  Use of a probe to apply extreme cold (cryotherapy).  Application of an intense beam of light (laser treatment).  Use of a probe to apply extreme heat (electrocautery).  Surgery. HOME CARE INSTRUCTIONS   Take medicines only as directed by your health care provider.  Use over-the-counter creams for itching or irritation as directed by your health care provider.  Keep all follow-up visits as directed by your health care provider. This is important.  Do not touch or scratch the warts.  Do not treat genital warts with medicines used for treating hand warts.  Do not have sex while you are being treated.  Do not douche or use tampons during treatment of HPV.  Tell your sex partner about your infection because he or she may also need treatment.  If you become pregnant, tell your health care provider that you have had HPV. Your health care provider will monitor you closely during pregnancy to be sure your baby is safe.  After treatment, use condoms during sex to prevent future infections.  Have only one sex partner.  Have a sex partner who does not have other sex partners. PREVENTION   Talk to your  health care provider about getting the HPV vaccines. These vaccines prevent some HPV infections and cancers. It is recommended that the vaccine be given to males and females between the ages of 55 and 13 years old. It will not work if you already have HPV, and it is not recommended for pregnant women.  A Pap test is done to screen for cervical cancer in women.  The first Pap test should be done at age 57 years.  Between ages 42 and 70 years, Pap  tests are repeated every 2 years.  Beginning at age 58, you are advised to have a Pap test every 3 years as long as your past 3 Pap tests have been normal.  Some women have medical problems that increase the chance of getting cervical cancer. Talk to your health care provider about these problems. It is especially important to talk to your health care provider if a new problem develops soon after your last Pap test. In these cases, your health care provider may recommend more frequent screening and Pap tests.  The above recommendations are the same for women who have or have not gotten the vaccine for HPV.  If you had a hysterectomy for a problem that was not a cancer or a condition that could lead to cancer, then you no longer need Pap tests. However, even if you no longer need a Pap test, a regular exam is a good idea to make sure no other problems are starting.   If you are between the ages of 20 and 38 years and you have had normal Pap tests going back 10 years, you no longer need Pap tests. However, even if you no longer need a Pap test, a regular exam is a good idea to make sure no other problems are starting.  If you have had past treatment for cervical cancer or a condition that could lead to cancer, you need Pap tests and screening for cancer for at least 20 years after your treatment.  If Pap tests have been discontinued, risk factors (such as a new sexual partner)need to be reassessed to determine if screening should be resumed.  Some women may need screenings more often if they are at high risk for cervical cancer. SEEK MEDICAL CARE IF:   The treated skin becomes red, swollen, or painful.  You have a fever.  You feel generally ill.  You feel lumps or pimple-like projections in and around your genital area.  You develop bleeding of the vagina or the treatment area.  You have painful sexual intercourse. MAKE SURE YOU:   Understand these instructions.  Will watch your  condition.  Will get help if you are not doing well or get worse.   This information is not intended to replace advice given to you by your health care provider. Make sure you discuss any questions you have with your health care provider.   Document Released: 07/25/2003 Document Revised: 05/25/2014 Document Reviewed: 08/09/2013 Elsevier Interactive Patient Education [employer].

## 2016-03-27 LAB — CYTOLOGY - PAP
Diagnosis: NEGATIVE
HPV 16/18/45 genotyping: NEGATIVE
HPV: DETECTED — AB

## 2016-03-30 ENCOUNTER — Encounter: Payer: Self-pay | Admitting: Obstetrics & Gynecology

## 2016-03-31 NOTE — Telephone Encounter (Signed)
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 Please schedule cryotherapy with me, and inform patient of results and appointment.

## 2016-03-31 NOTE — Telephone Encounter (Signed)
Called pt, no answer, left VM to call the office.

## 2016-04-01 ENCOUNTER — Telehealth: Payer: Self-pay | Admitting: *Deleted

## 2016-04-01 ENCOUNTER — Encounter: Payer: Self-pay | Admitting: *Deleted

## 2016-04-01 NOTE — Telephone Encounter (Signed)
Informed pt of pap result and persistent HPV, scheduled Cryotherapy for 04-20-16 at 3:00pm.

## 2016-04-01 NOTE — Telephone Encounter (Signed)
Called pt, no answer, left message to call the office.  

## 2016-04-01 NOTE — Telephone Encounter (Signed)
-----   Message from Bennye Alm sent at 04/01/2016  9:48 AM EST ----- Regarding: Return Call Contact: [phone] Patient returned your call, so if you could call her back when you get a chance.  Thank you!

## 2016-04-01 NOTE — Telephone Encounter (Signed)
-----   Message from Bennye Alm sent at 04/01/2016  9:48 AM EST ----- Regarding: Return Call Contact: [phone] Patient returned your call, so if you could call her back when you get a chance.  Thank you!

## 2016-04-14 ENCOUNTER — Other Ambulatory Visit: Payer: Self-pay | Admitting: Internal Medicine

## 2016-04-19 ENCOUNTER — Other Ambulatory Visit: Payer: Self-pay | Admitting: Internal Medicine

## 2016-04-20 ENCOUNTER — Encounter: Payer: Self-pay | Admitting: Obstetrics & Gynecology

## 2016-04-20 ENCOUNTER — Ambulatory Visit (INDEPENDENT_AMBULATORY_CARE_PROVIDER_SITE_OTHER): Payer: Medicare Other | Admitting: Obstetrics & Gynecology

## 2016-04-20 VITALS — BP 137/81 | HR 79 | Resp 18 | Ht 66.0 in | Wt 183.0 lb

## 2016-04-20 DIAGNOSIS — R87810 Cervical high risk human papillomavirus (HPV) DNA test positive: Secondary | ICD-10-CM | POA: Diagnosis not present

## 2016-04-20 NOTE — Progress Notes (Signed)
    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 Persistent HPV since 2014.   The indications for cryotherapy were reviewed with the patient in detail. She was counseled about that efficacy of this procedure, and possible need for excisional procedure in the future if her cervical dysplasia persists.  The risks of the procedure where explained in detail and patient was told to expect a copious amount of discharge in the next few weeks. All her questions were answered, and written informed consent was obtained.  The patient was placed in the dorsal lithotomy position and a vaginal speculum was placed. Her cervix was visualized and the appropriate cryotherapy probe was picked and affixed to cryotherapy apparatus. Then nitrogen gas was then activated, the probe was coated with lubricating jelly and applied to the transformation zone of the cervix. This was kept in place for 3 minutes. The cryotherapy was then stopped and all instruments were removed from the patient's pelvis; a thawing period of 3 minutes was observed.  A second cycle of cryotherapy was then administered to the cervix for 3 minutes.  The patient tolerated the procedure well without any complications. Routine post procedure instructions were given to the patient.  Will repeat pap smear in 12 and 24 months and manage accordingly.   Verita Schneiders, MD, [place] Attending [place], [facility] for [employer], [place]

## 2016-04-20 NOTE — Patient Instructions (Signed)
Cervical cryotherapy is a procedure which involves freezing an area of abnormal tissue on the cervix. This tissue gradually disappears and the cervix heals. One cervical cryotherapy is usually sufficient to destroy the abnormal tissue.  Purpose  Cervical cryotherapy is a standard method used to treat cervical dysplasia, meaning the removal of abnormal cell tissue on the cervix.  Description  Cervical cryotherapy, or freezing, usually lasts about five minutes and causes a slight amount of discomfort. The procedure is usually performed in an outpatient setting.  Cervical cryotherapy is done by placing a small freeze-probe (cryoprobe) against the cervix that cools the cervix to sub-zero temperatures. The cells destroyed by freezing are shed afterwards in a heavy watery discharge. The main advantage of cryotherapy is that it is a simple procedure that requires inexpensive equipment.  The cryogenic device consists of a gas tank containing a refrigerant and non-explosive, non-toxic gas (usually nitrous oxide). The gas is delivered using flexible tubing through a gun-type attachment to the cryoprobe.  Diagnosis/Preparation  Women who undergo cervical cryotherapy typically have had an abnormal Pap smear which has led to a diagnosis of cervical squamous dysplasia and usually confirmed by biopsy after an adequate colposcopic exam.  Preparation for cervical cryotherapy involves scheduling the procedure when the patient is not experiencing heavy menstrual flow. Ibuprofen or naproxen sodium may be given before cryotherapy to decrease cramping. If there is any doubt about the pregnancy status, a pregnancy test is performed.  Aftercare  Cervical cryotherapy is often followed by a heavy and often odorous discharge during the first month after the procedure. The discharge is due to the dead tissue cells leaving the treatment site. The patient should abstain from sexual intercourse and not use tampons for a period of two  weeks after the procedure. Excessive exercise should also be avoided to lessen the occurrence of post-therapy bleeding.  Risks  The following risks have been associated with cervical cryotherapy:  Uterine cramping. Often occurs during the cryotherapy but rapidly subsides after treatment.  Bleeding and infection. Rare, but incidences have been reported.  More difficult Pap smears. Future Pap smears and colposcopy may be more difficult after cryotherapy.  Normal results  A normal result is no recurrence of the abnormal cervix cells. The first follow-up Pap smear is done at 12 months then 24 months after the procedure. If normal, patients resume usual pap smear screening.  If any, recurrences usually occur within two years of treatment.  If a follow-up Pap smear is abnormal, a colposcopy with biopsy is usually performed. Other treatment methods, usually the loop electrocautery excision procedure (LEEP) are then used if persistent disease is discovered.  Following the procedure, it is considered normal to experience the following:  slight cramping for two to three days  watery discharge requiring several pad changes daily  Bloody or brown discharge, especially 12-16 days after the procedure  Alternatives  Loop electrocautery excision procedure (LEEP). This procedure uses a fine wire loop with an electric current flowing through it to remove the desired area of the cervix. Loop excision is usually done under local anesthesia and causes very little discomfort.     

## 2016-04-22 ENCOUNTER — Encounter: Payer: Self-pay | Admitting: *Deleted

## 2016-06-18 ENCOUNTER — Telehealth: Payer: Self-pay

## 2016-06-18 NOTE — Telephone Encounter (Signed)
LMOM for patient to call back about her Lumbar Orthosis Brace. Need measurement from patients naval around the back and back to the [employer].

## 2016-06-24 ENCOUNTER — Telehealth: Payer: Self-pay | Admitting: Internal Medicine

## 2016-06-24 DIAGNOSIS — K760 Fatty (change of) liver, not elsewhere classified: Secondary | ICD-10-CM

## 2016-06-24 DIAGNOSIS — E119 Type 2 diabetes mellitus without complications: Secondary | ICD-10-CM

## 2016-06-24 DIAGNOSIS — E785 Hyperlipidemia, unspecified: Secondary | ICD-10-CM

## 2016-06-24 DIAGNOSIS — I10 Essential (primary) hypertension: Secondary | ICD-10-CM

## 2016-06-24 NOTE — Telephone Encounter (Deleted)
Patient has upcoming office visit on 07/03/16  Wondering if A1C needs to be checked before she gets Hep A/B injection.  Please advise.

## 2016-06-24 NOTE — Telephone Encounter (Signed)
Pt called wanting to know if pt needs to get her A1c checked before she gets the Hep a/b? Please advise?  Call pt @ [phone]. Thank you!

## 2016-06-25 NOTE — Telephone Encounter (Signed)
Pt has requested a call [phone]

## 2016-06-25 NOTE — Telephone Encounter (Signed)
That was perfect,!  Signed and thank you!

## 2016-06-25 NOTE — Telephone Encounter (Signed)
LMTC

## 2016-06-25 NOTE — Telephone Encounter (Signed)
Patient coming in for labs 06/26/16 at 8:30 I have pended labs do you need anything else lab wise for upcoming appointment?

## 2016-06-25 NOTE — Telephone Encounter (Signed)
LM for patient to call 

## 2016-06-26 ENCOUNTER — Other Ambulatory Visit (INDEPENDENT_AMBULATORY_CARE_PROVIDER_SITE_OTHER): Payer: Medicare Other

## 2016-06-26 DIAGNOSIS — I10 Essential (primary) hypertension: Secondary | ICD-10-CM | POA: Diagnosis not present

## 2016-06-26 DIAGNOSIS — K760 Fatty (change of) liver, not elsewhere classified: Secondary | ICD-10-CM | POA: Diagnosis not present

## 2016-06-26 DIAGNOSIS — E785 Hyperlipidemia, unspecified: Secondary | ICD-10-CM | POA: Diagnosis not present

## 2016-06-26 DIAGNOSIS — E119 Type 2 diabetes mellitus without complications: Secondary | ICD-10-CM | POA: Diagnosis not present

## 2016-06-26 LAB — CBC WITH DIFFERENTIAL/PLATELET
Basophils Absolute: 0.1 10*3/uL (ref 0.0–0.1)
Basophils Relative: 1.2 % (ref 0.0–3.0)
Eosinophils Absolute: 0.1 10*3/uL (ref 0.0–0.7)
Eosinophils Relative: 2.5 % (ref 0.0–5.0)
HCT: 42.5 % (ref 36.0–46.0)
Hemoglobin: 14 g/dL (ref 12.0–15.0)
Lymphocytes Relative: 29.4 % (ref 12.0–46.0)
Lymphs Abs: 1.3 10*3/uL (ref 0.7–4.0)
MCHC: 33 g/dL (ref 30.0–36.0)
MCV: 93.5 fl (ref 78.0–100.0)
Monocytes Absolute: 0.3 10*3/uL (ref 0.1–1.0)
Monocytes Relative: 7.6 % (ref 3.0–12.0)
Neutro Abs: 2.6 10*3/uL (ref 1.4–7.7)
Neutrophils Relative %: 59.3 % (ref 43.0–77.0)
Platelets: 239 10*3/uL (ref 150.0–400.0)
RBC: 4.55 Mil/uL (ref 3.87–5.11)
RDW: 15.9 % — ABNORMAL HIGH (ref 11.5–15.5)
WBC: 4.4 10*3/uL (ref 4.0–10.5)

## 2016-06-26 LAB — LIPID PANEL
Cholesterol: 137 mg/dL (ref 0–200)
HDL: 52.1 mg/dL (ref >39.00)
LDL Cholesterol: 68 mg/dL (ref 0–99)
NonHDL: 84.49
Total CHOL/HDL Ratio: 3
Triglycerides: 80 mg/dL (ref 0.0–149.0)
VLDL: 16 mg/dL (ref 0.0–40.0)

## 2016-06-26 LAB — COMPREHENSIVE METABOLIC PANEL
ALT: 21 U/L (ref 0–35)
AST: 27 U/L (ref 0–37)
Albumin: 4.1 g/dL (ref 3.5–5.2)
Alkaline Phosphatase: 98 U/L (ref 39–117)
BUN: 14 mg/dL (ref 6–23)
CO2: 29 mEq/L (ref 19–32)
Calcium: 9 mg/dL (ref 8.4–10.5)
Chloride: 104 mEq/L (ref 96–112)
Creatinine, Ser: 0.7 mg/dL (ref 0.40–1.20)
GFR: 88.1 mL/min (ref >60.00)
Glucose, Bld: 138 mg/dL — ABNORMAL HIGH (ref 70–99)
Potassium: 3.9 mEq/L (ref 3.5–5.1)
Sodium: 138 mEq/L (ref 135–145)
Total Bilirubin: 0.9 mg/dL (ref 0.2–1.2)
Total Protein: 6.7 g/dL (ref 6.0–8.3)

## 2016-06-26 LAB — HEMOGLOBIN A1C: Hgb A1c MFr Bld: 7 % — ABNORMAL HIGH (ref 4.6–6.5)

## 2016-06-26 NOTE — Addendum Note (Signed)
Addended by: Leeanne Rio on: 06/26/2016 08:11 AM   Modules accepted: Orders

## 2016-06-28 ENCOUNTER — Encounter: Payer: Self-pay | Admitting: Internal Medicine

## 2016-07-03 ENCOUNTER — Encounter: Payer: Self-pay | Admitting: Internal Medicine

## 2016-07-03 ENCOUNTER — Ambulatory Visit (INDEPENDENT_AMBULATORY_CARE_PROVIDER_SITE_OTHER): Payer: Medicare Other | Admitting: Internal Medicine

## 2016-07-03 ENCOUNTER — Other Ambulatory Visit: Payer: Self-pay | Admitting: Internal Medicine

## 2016-07-03 DIAGNOSIS — I10 Essential (primary) hypertension: Secondary | ICD-10-CM

## 2016-07-03 DIAGNOSIS — Z23 Encounter for immunization: Secondary | ICD-10-CM | POA: Diagnosis not present

## 2016-07-03 DIAGNOSIS — E119 Type 2 diabetes mellitus without complications: Secondary | ICD-10-CM

## 2016-07-03 DIAGNOSIS — K760 Fatty (change of) liver, not elsewhere classified: Secondary | ICD-10-CM | POA: Diagnosis not present

## 2016-07-03 DIAGNOSIS — M05719 Rheumatoid arthritis with rheumatoid factor of unspecified shoulder without organ or systems involvement: Secondary | ICD-10-CM

## 2016-07-03 NOTE — Progress Notes (Signed)
Pre visit review using our clinic review tool, if applicable. No additional management support is needed unless otherwise documented below in the visit note. 

## 2016-07-03 NOTE — Progress Notes (Signed)
Subjective:  Patient ID: [name], female    DOB: Apr 07, 1946  Age: 71 y.o. 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  HPI [name] presents for  6 month  follow up on diabetes.  Patient has no complaints today.  Patient is following a low glycemic index diet and taking all prescribed medications regularly without side effects.  Fasting sugars have been under less than 140 most of the time and post prandials have been under 160 except on rare occasions. Patient is exercising about 3 times per week and intentionally trying to lose weight .  Patient has had an eye exam in the last 12 months and checks feet regularly for signs of infection.  Patient does not walk barefoot outside,  And denies an numbness tingling or burning in feet. Patient is up to date on all recommended vaccinations.  Scratchy throat   Outpatient Medications Prior to Visit  Medication Sig Dispense Refill  . ACCU-CHEK AVIVA PLUS test strip TEST CBG THREE TIMES DAILY, E11.65 300 each 12  . aspirin 81 MG tablet Take 1 tablet (81 mg total) by mouth daily. 30 tablet 2  . atorvastatin (LIPITOR) 80 MG tablet TAKE 1 TABLET (80 MG TOTAL) BY MOUTH DAILY. (Patient taking differently: TAKE 1/2 TABLET (40 MG TOTAL) BY MOUTH DAILY.) 90 tablet 1  . bisacodyl (DULCOLAX) 5 MG EC tablet Take 5 mg by mouth daily as needed for moderate constipation.    . blood glucose meter kit and supplies KIT Dispense based on patient and insurance preference. Use up to four times daily as directed. (FOR ICD-9 250.00, 250.01). 1 each 0  . calcium-vitamin D (OSCAL WITH D) 250-125 MG-UNIT per tablet Take 2 tablets by mouth daily.    . famotidine (PEPCID) 40 MG tablet Take 1 tablet (40 mg  total) by mouth daily. 90 tablet 1  . fish oil-omega-3 fatty acids 1000 MG capsule Take 2 g by mouth 2 (two) times daily.      Marland Kitchen glipiZIDE (GLUCOTROL) 5 MG tablet Take 1 tablet (5 mg total) by mouth 2 (two) times daily before a meal. 180 tablet 1  . losartan (COZAAR) 50 MG tablet TAKE 1 TABLET (50 MG TOTAL) BY MOUTH DAILY. 90 tablet 1  . Methotrexate, Anti-Rheumatic, (METHOTREXATE, PF, [place]) Inject 0.7 mLs into the skin once a week.     . Multiple Vitamin (MULTIVITAMIN) tablet Take 1 tablet by mouth daily.    . vitamin C (ASCORBIC ACID) 500 MG tablet Take 500 mg by mouth daily.    . vitamin E 100 UNIT capsule Take 100 Units by mouth daily.    . methotrexate (RHEUMATREX) 2.5 MG tablet Take by mouth. Take 1 by mouth every 7 days. 8 pills once a week with a meal.    . traMADol (ULTRAM) 50 MG tablet Take 1 tablet (50 mg total) by mouth every 12 (twelve) hours as needed. (Patient not taking: Reported on 07/03/2016) 60 tablet 3   No facility-administered medications prior to visit.     Review of Systems;  Patient denies headache, fevers, malaise, unintentional weight loss, skin rash, eye pain, sinus congestion and sinus pain, sore throat, dysphagia,  hemoptysis , cough, dyspnea, wheezing, chest pain, palpitations, orthopnea, edema, abdominal pain, nausea, melena, diarrhea, constipation, flank pain, dysuria, hematuria,  urinary  Frequency, nocturia, numbness, tingling, seizures,  Focal weakness, Loss of consciousness,  Tremor, insomnia, depression, anxiety, and suicidal ideation.      Objective:  BP 130/90   Pulse 80   Resp 16   Wt 188 lb (85.3 kg)   SpO2 95%   BMI 30.34 kg/m   BP Readings from Last 3 Encounters:  07/03/16 130/90  04/20/16 137/81  03/24/16 131/81    Wt Readings from Last 3 Encounters:  07/03/16 188 lb (85.3 kg)  04/20/16 183 lb (83 kg)  03/24/16 181 lb (82.1 kg)    General appearance: alert, cooperative and appears stated age Ears: normal TM's and external ear canals  both ears Throat: lips, mucosa, and tongue normal; teeth and gums normal Neck: no adenopathy, no carotid bruit, supple, symmetrical, trachea midline and thyroid not enlarged, symmetric, no tenderness/mass/nodules Back: symmetric, no curvature. ROM normal. No CVA tenderness. Lungs: clear to auscultation bilaterally Heart: regular rate and rhythm, S1, S2 normal, no murmur, click, rub or gallop Abdomen: soft, non-tender; bowel sounds normal; no masses,  no organomegaly Pulses: 2+ and symmetric Skin: Skin color, texture, turgor normal. No rashes or lesions Lymph nodes: Cervical, supraclavicular, and axillary nodes normal.  Lab Results  Component Value Date   HGBA1C 7.0 (H) 06/26/2016   HGBA1C 6.9 (H) 12/30/2015   HGBA1C 6.7 (H) 09/23/2015    Lab Results  Component Value Date   CREATININE 0.70 06/26/2016   CREATININE 0.66 12/30/2015   CREATININE 0.63 09/23/2015    Lab Results  Component Value Date   WBC 4.4 06/26/2016   HGB 14.0 06/26/2016   HCT 42.5 06/26/2016   PLT 239.0 06/26/2016   GLUCOSE 138 (H) 06/26/2016   CHOL 137 06/26/2016   TRIG 80.0 06/26/2016   HDL 52.10 06/26/2016   LDLDIRECT 65.0 09/23/2015   LDLCALC 68 06/26/2016   ALT 21 06/26/2016   AST 27 06/26/2016   NA 138 06/26/2016   K 3.9 06/26/2016   CL 104 06/26/2016   CREATININE 0.70 06/26/2016   BUN 14 06/26/2016   CO2 29 06/26/2016   TSH 2.12 05/14/2014   HGBA1C 7.0 (H) 06/26/2016   MICROALBUR <0.7 12/30/2015    No results found.  Assessment & Plan:   Problem List Items Addressed This Visit    Diabetes mellitus with no complication ([place])    Excellent control on oral medications alone since stopping prednisone; her recent hypoglycemic events in early afternoon were addressed by eliminating her morning dose of glipizide to 2.5 mg on the days she exercises. Marland Kitchen  exam is normal.  Eye exam advised   Lab Results  Component Value Date   HGBA1C 7.0 (H) 06/26/2016   Lab Results  Component Value Date    MICROALBUR <0.7 12/30/2015          Fatty liver disease, nonalcoholic    Secondary to obesity and diabetes/metabolic syndrome. I have addressed  BMI and recommended a low glycemic index diet utilizing smaller more frequent meals to increase metabolism.Her participation in regular  Exercise is ongoing  But limited by left shoulder pain .  Lab Results  Component Value Date   ALT 21 06/26/2016   AST 27 06/26/2016   ALKPHOS 98 06/26/2016   BILITOT 0.9 06/26/2016   Repeat  revaccination for Hep a and B as finished today          Hypertension    Well controlled on current regimen. Renal function stable, no changes today.  Lab Results  Component Value Date  CREATININE 0.70 06/26/2016   Lab Results  Component Value Date   NA 138 06/26/2016   K 3.9 06/26/2016   CL 104 06/26/2016   CO2 29 06/26/2016         Rheumatoid arthritis ([facility])    Mostly affecting left shoulder, left hip, managed currently with MTX and OTC NSAIDs,  Dose reduced in January by Dr. Jefm Bryant,  cbc normal in January.  Lab Results  Component Value Date   ESRSEDRATE 18 09/25/2015   Lab Results  Component Value Date   CRP 0.2 (L) 09/25/2015          Other Visit Diagnoses    Need for hepatitis A and B vaccination       Relevant Orders   Hepatitis A hepatitis B combined vaccine IM (Completed)      I have discontinued Ms. Faughnan methotrexate. I am also having her maintain her fish oil-omega-3 fatty acids, vitamin C, vitamin E, multivitamin, aspirin, calcium-vitamin D, blood glucose meter kit and supplies, (Methotrexate, Anti-Rheumatic, (METHOTREXATE, PF, [place])), bisacodyl, traMADol, famotidine, glipiZIDE, ACCU-CHEK AVIVA PLUS, losartan, and atorvastatin.  No orders of the defined types were placed in this encounter.   Medications Discontinued During This Encounter  Medication Reason  . methotrexate (RHEUMATREX) 2.5 MG tablet Patient has not taken in last 30 days    Follow-up: Return in about 6  months (around 12/31/2016).   Crecencio Mc, MD

## 2016-07-03 NOTE — Patient Instructions (Addendum)
You either have a early viral infection,  or your allergies are causing post nasal drip that is irritating your throat   Start a daily antihistamine or start one of your "cold and flu" meds   Benadryl at bedtime to dry the PND  Flush sinuses with netti Pott  gargle with salt water for store throat   Deslym for cough   Let me know before your [place] trip if you want to take some antibiotics /meds with you   KEEP DOING WHAT YOU ARE DONG WITH YOUR DIABETES;  BETTER TO HAVE A FEW HIGH SUGARS TO AVOID A SUPER LOW ONE

## 2016-07-05 NOTE — Assessment & Plan Note (Signed)
Well controlled on current regimen. Renal function stable, no changes today.  Lab Results  Component Value Date   CREATININE 0.70 06/26/2016   Lab Results  Component Value Date   NA 138 06/26/2016   K 3.9 06/26/2016   CL 104 06/26/2016   CO2 29 06/26/2016

## 2016-07-05 NOTE — Assessment & Plan Note (Signed)
Excellent control on oral medications alone since stopping prednisone; her recent hypoglycemic events in early afternoon were addressed by eliminating her morning dose of glipizide to 2.5 mg on the days she exercises. Marland Kitchen  exam is normal.  Eye exam advised   Lab Results  Component Value Date   HGBA1C 7.0 (H) 06/26/2016   Lab Results  Component Value Date   MICROALBUR <0.7 12/30/2015

## 2016-07-05 NOTE — Assessment & Plan Note (Addendum)
Mostly affecting left shoulder, left hip, managed currently with MTX and OTC NSAIDs,  Dose reduced in January by Dr. Jefm Bryant,  cbc normal in January.  Lab Results  Component Value Date   ESRSEDRATE 18 09/25/2015   Lab Results  Component Value Date   CRP 0.2 (L) 09/25/2015

## 2016-07-05 NOTE — Assessment & Plan Note (Addendum)
Secondary to obesity and diabetes/metabolic syndrome. I have addressed  BMI and recommended a low glycemic index diet utilizing smaller more frequent meals to increase metabolism.Her participation in regular  Exercise is ongoing  But limited by left shoulder pain .  Lab Results  Component Value Date   ALT 21 06/26/2016   AST 27 06/26/2016   ALKPHOS 98 06/26/2016   BILITOT 0.9 06/26/2016   Repeat  revaccination for Hep a and B as finished today

## 2016-10-08 ENCOUNTER — Encounter: Payer: Self-pay | Admitting: Podiatry

## 2016-10-08 ENCOUNTER — Ambulatory Visit (INDEPENDENT_AMBULATORY_CARE_PROVIDER_SITE_OTHER): Payer: Medicare Other | Admitting: Podiatry

## 2016-10-08 ENCOUNTER — Telehealth: Payer: Self-pay | Admitting: Internal Medicine

## 2016-10-08 DIAGNOSIS — B351 Tinea unguium: Secondary | ICD-10-CM

## 2016-10-08 DIAGNOSIS — M79674 Pain in right toe(s): Secondary | ICD-10-CM | POA: Diagnosis not present

## 2016-10-08 DIAGNOSIS — E785 Hyperlipidemia, unspecified: Secondary | ICD-10-CM

## 2016-10-08 DIAGNOSIS — E119 Type 2 diabetes mellitus without complications: Secondary | ICD-10-CM

## 2016-10-08 DIAGNOSIS — I10 Essential (primary) hypertension: Secondary | ICD-10-CM

## 2016-10-08 DIAGNOSIS — R5383 Other fatigue: Secondary | ICD-10-CM

## 2016-10-08 NOTE — Telephone Encounter (Signed)
Pt called and would like to have lab work done before her visit on 8/16. Please advise, thank you!  Call pt@ [phone]

## 2016-10-08 NOTE — Progress Notes (Signed)
This patient presents the office today saying that her big toenail on her right foot is lifting and moving. She says that it is painful at times and it was painful 2-3 days ago. She says that she is leaving the country and desires it to be evaluated and treated. She was seen by Dr. Milinda Pointer 2 years ago who recommended nail surgery for the permanent removal of this right great toenail. She presents the office today for an evaluation and treatment of this problem.  Patient is diabetic  GENERAL APPEARANCE: Alert, conversant. Appropriately groomed. No acute distress.  VASCULAR: Pedal pulses are  palpable at  [facility] and PT bilateral.  Capillary refill time is immediate to all digits,  Normal temperature gradient.  Digital hair growth is present bilateral  NEUROLOGIC: sensation is normal to 5.07 monofilament at 5/5 sites bilateral.  Light touch is intact bilateral, Muscle strength normal.  MUSCULOSKELETAL: acceptable muscle strength, tone and stability bilateral.  Intrinsic muscluature intact bilateral.  Rectus appearance of foot and digits noted bilateral.   DERMATOLOGIC: skin color, texture, and turgor are within normal limits.  No preulcerative lesions or ulcers  are seen, no interdigital maceration noted.  No open lesions present.  . No drainage noted. NAILS  right nail plate is unattached to the nailbed except at the proximal nail fold. No evidence of any redness, swelling, infection or drainage  Hematoma  Nail Injury  Debridement of long thick nail plate right foot.  Gardiner Barefoot DPM

## 2016-10-09 NOTE — Telephone Encounter (Signed)
LMTCB. Labs have been ordered. Need to schedule a fasting lab appt before her appt in August.

## 2016-10-09 NOTE — Telephone Encounter (Signed)
I pended labs, last Labs were in 06/26/16, appt in August.  thanks

## 2016-10-09 NOTE — Telephone Encounter (Signed)
Patient scheduled.

## 2016-10-15 ENCOUNTER — Other Ambulatory Visit: Payer: Self-pay | Admitting: Internal Medicine

## 2016-12-03 ENCOUNTER — Other Ambulatory Visit: Payer: Self-pay | Admitting: Internal Medicine

## 2016-12-17 ENCOUNTER — Other Ambulatory Visit: Payer: Self-pay | Admitting: Internal Medicine

## 2016-12-24 ENCOUNTER — Other Ambulatory Visit (INDEPENDENT_AMBULATORY_CARE_PROVIDER_SITE_OTHER): Payer: Medicare Other

## 2016-12-24 DIAGNOSIS — E785 Hyperlipidemia, unspecified: Secondary | ICD-10-CM | POA: Diagnosis not present

## 2016-12-24 DIAGNOSIS — E119 Type 2 diabetes mellitus without complications: Secondary | ICD-10-CM | POA: Diagnosis not present

## 2016-12-24 DIAGNOSIS — R5383 Other fatigue: Secondary | ICD-10-CM | POA: Diagnosis not present

## 2016-12-24 DIAGNOSIS — I10 Essential (primary) hypertension: Secondary | ICD-10-CM | POA: Diagnosis not present

## 2016-12-24 LAB — LIPID PANEL
Cholesterol: 129 mg/dL (ref 0–200)
HDL: 52.1 mg/dL (ref >39.00)
LDL Cholesterol: 61 mg/dL (ref 0–99)
NonHDL: 76.58
Total CHOL/HDL Ratio: 2
Triglycerides: 80 mg/dL (ref 0.0–149.0)
VLDL: 16 mg/dL (ref 0.0–40.0)

## 2016-12-24 LAB — HEMOGLOBIN A1C: Hgb A1c MFr Bld: 7.2 % — ABNORMAL HIGH (ref 4.6–6.5)

## 2016-12-24 LAB — CBC WITH DIFFERENTIAL/PLATELET
Basophils Absolute: 0 10*3/uL (ref 0.0–0.1)
Basophils Relative: 1.1 % (ref 0.0–3.0)
Eosinophils Absolute: 0.1 10*3/uL (ref 0.0–0.7)
Eosinophils Relative: 1.8 % (ref 0.0–5.0)
HCT: 42.2 % (ref 36.0–46.0)
Hemoglobin: 14 g/dL (ref 12.0–15.0)
Lymphocytes Relative: 24.9 % (ref 12.0–46.0)
Lymphs Abs: 1 10*3/uL (ref 0.7–4.0)
MCHC: 33.1 g/dL (ref 30.0–36.0)
MCV: 94.7 fl (ref 78.0–100.0)
Monocytes Absolute: 0.2 10*3/uL (ref 0.1–1.0)
Monocytes Relative: 5.7 % (ref 3.0–12.0)
Neutro Abs: 2.7 10*3/uL (ref 1.4–7.7)
Neutrophils Relative %: 66.5 % (ref 43.0–77.0)
Platelets: 225 10*3/uL (ref 150.0–400.0)
RBC: 4.45 Mil/uL (ref 3.87–5.11)
RDW: 15.2 % (ref 11.5–15.5)
WBC: 4 10*3/uL (ref 4.0–10.5)

## 2016-12-24 LAB — COMPREHENSIVE METABOLIC PANEL
ALT: 21 U/L (ref 0–35)
AST: 29 U/L (ref 0–37)
Albumin: 4.2 g/dL (ref 3.5–5.2)
Alkaline Phosphatase: 98 U/L (ref 39–117)
BUN: 14 mg/dL (ref 6–23)
CO2: 29 mEq/L (ref 19–32)
Calcium: 9.2 mg/dL (ref 8.4–10.5)
Chloride: 103 mEq/L (ref 96–112)
Creatinine, Ser: 0.79 mg/dL (ref 0.40–1.20)
GFR: 76.51 mL/min (ref >60.00)
Glucose, Bld: 147 mg/dL — ABNORMAL HIGH (ref 70–99)
Potassium: 4.2 mEq/L (ref 3.5–5.1)
Sodium: 138 mEq/L (ref 135–145)
Total Bilirubin: 1.1 mg/dL (ref 0.2–1.2)
Total Protein: 6.7 g/dL (ref 6.0–8.3)

## 2016-12-24 LAB — LDL CHOLESTEROL, DIRECT: Direct LDL: 61 mg/dL

## 2016-12-24 LAB — TSH: TSH: 2.34 u[IU]/mL (ref 0.35–4.50)

## 2016-12-24 LAB — VITAMIN D 25 HYDROXY (VIT D DEFICIENCY, FRACTURES): VITD: 41 ng/mL (ref 30.00–100.00)

## 2016-12-27 ENCOUNTER — Encounter: Payer: Self-pay | Admitting: Internal Medicine

## 2016-12-31 ENCOUNTER — Encounter: Payer: Self-pay | Admitting: Internal Medicine

## 2016-12-31 ENCOUNTER — Ambulatory Visit (INDEPENDENT_AMBULATORY_CARE_PROVIDER_SITE_OTHER): Payer: Medicare Other | Admitting: Internal Medicine

## 2016-12-31 VITALS — BP 126/78 | HR 70 | Temp 97.5°F | Resp 16 | Ht 66.0 in | Wt 183.6 lb

## 2016-12-31 DIAGNOSIS — M62830 Muscle spasm of back: Secondary | ICD-10-CM

## 2016-12-31 DIAGNOSIS — I10 Essential (primary) hypertension: Secondary | ICD-10-CM | POA: Diagnosis not present

## 2016-12-31 DIAGNOSIS — E119 Type 2 diabetes mellitus without complications: Secondary | ICD-10-CM

## 2016-12-31 DIAGNOSIS — K760 Fatty (change of) liver, not elsewhere classified: Secondary | ICD-10-CM | POA: Diagnosis not present

## 2016-12-31 LAB — HM DIABETES EYE EXAM

## 2016-12-31 NOTE — Progress Notes (Signed)
Subjective:  Patient ID: [name], female    DOB: August 09, 1945  Age: 71 y.o. MRN: [id number]  CC: The primary encounter diagnosis was Diabetes mellitus with no complication ([place]). Diagnoses of Fatty liver disease, nonalcoholic, Spasm of thoracic back muscle, and Essential hypertension were also pertinent to this visit.  HPI [name] presents for 3 month follow up on diabetes.  Patient has no complaints today.  Patient is following a low glycemic index diet and taking all prescribed medications regularly without side effects.  Fasting sugars have been under less than 120 most of the time and post prandials have not been checked. Patient has had recurrent hypoglycemic events when she delays eating. Patient is exercising about 3 times per week and intentionally trying to lose weight .  Patient has had an eye exam in the last 12 months and checks feet regularly for signs of infection.  Patient does not walk barefoot outside,  And denies an numbness tingling or burning in feet. Patient is up to date on all recommended vaccinations  Cereal for breakfast.  Lunch:  Lt n fit yogurt  Watches carbs Walking for exercise   Cc has been experiencing left sided  thoracic pain  FOR THE PAST WEEK that is made worse  with movement.  Better  During exercise on an elliptiglider  Aggravated by picking up her 67 month old granddaughter   Outpatient Medications Prior to Visit  Medication Sig Dispense Refill  . ACCU-CHEK AVIVA PLUS test strip TEST CBG THREE TIMES DAILY, E11.65 300 each 12  . aspirin 81 MG tablet Take 1 tablet (81 mg total) by mouth daily. 30 tablet 2  . atorvastatin (LIPITOR) 80 MG tablet TAKE 1 TABLET (80 MG TOTAL) BY MOUTH DAILY. 90 tablet 1  . B-D INS SYR ULTRAFINE 1CC/31G 31G X 5/16" 1 ML MISC USE TO INJECT INSULIN TWICE DAILY 100 each 3  . bisacodyl (DULCOLAX) 5 MG EC tablet Take 5 mg by mouth daily as needed for moderate constipation.    . blood glucose meter kit and supplies KIT Dispense  based on patient and insurance preference. Use up to four times daily as directed. (FOR ICD-9 250.00, 250.01). 1 each 0  . calcium-vitamin D (OSCAL WITH D) 250-125 MG-UNIT per tablet Take 2 tablets by mouth daily.    . famotidine (PEPCID) 40 MG tablet Take 1 tablet (40 mg total) by mouth daily. 90 tablet 1  . fish oil-omega-3 fatty acids 1000 MG capsule Take 2 g by mouth 2 (two) times daily.      Marland Kitchen glipiZIDE (GLUCOTROL) 5 MG tablet TAKE 1 TABLET (5 MG TOTAL) BY MOUTH 2 (TWO) TIMES DAILY BEFORE A MEAL. 180 tablet 1  . losartan (COZAAR) 50 MG tablet TAKE 1 TABLET (50 MG TOTAL) BY MOUTH DAILY. 90 tablet 1  . Methotrexate, Anti-Rheumatic, (METHOTREXATE, PF, [place] ) Inject 0.7 mLs into the skin once a week.     . Multiple Vitamin (MULTIVITAMIN) tablet Take 1 tablet by mouth daily.    . vitamin C (ASCORBIC ACID) 500 MG tablet Take 500 mg by mouth daily.    . vitamin E 100 UNIT capsule Take 100 Units by mouth daily.    . traMADol (ULTRAM) 50 MG tablet Take 1 tablet (50 mg total) by mouth every 12 (twelve) hours as needed. (Patient not taking: Reported on 12/31/2016) 60 tablet 3   No facility-administered medications prior to visit.     Review of Systems;  Patient denies headache, fevers, malaise, unintentional  weight loss, skin rash, eye pain, sinus congestion and sinus pain, sore throat, dysphagia,  hemoptysis , cough, dyspnea, wheezing, chest pain, palpitations, orthopnea, edema, abdominal pain, nausea, melena, diarrhea, constipation, flank pain, dysuria, hematuria, urinary  Frequency, nocturia, numbness, tingling, seizures,  Focal weakness, Loss of consciousness,  Tremor, insomnia, depression, anxiety, and suicidal ideation.      Objective:  BP 126/78 (BP Location: Left Arm, Patient Position: Sitting, Cuff Size: Normal)   Pulse 70   Temp (!) 97.5 F (36.4 C) (Oral)   Resp 16   Ht 5' 6"  (1.676 m)   Wt 183 lb 9.6 oz (83.3 kg)   SpO2 97%   BMI 29.63 kg/m   BP Readings from Last 3 Encounters:    12/31/16 126/78  07/03/16 130/90  04/20/16 137/81    Wt Readings from Last 3 Encounters:  12/31/16 183 lb 9.6 oz (83.3 kg)  07/03/16 188 lb (85.3 kg)  04/20/16 183 lb (83 kg)    General appearance: alert, cooperative and appears stated age Ears: normal TM's and external ear canals both ears Throat: lips, mucosa, and tongue normal; teeth and gums normal Neck: no adenopathy, no carotid bruit, supple, symmetrical, trachea midline and thyroid not enlarged, symmetric, no tenderness/mass/nodules Back: symmetric, no curvature. ROM normal. No CVA tenderness. Lungs: clear to auscultation bilaterally Heart: regular rate and rhythm, S1, S2 normal, no murmur, click, rub or gallop Abdomen: soft, non-tender; bowel sounds normal; no masses,  no organomegaly Pulses: 2+ and symmetric Skin: Skin color, texture, turgor normal. No rashes or lesions Lymph nodes: Cervical, supraclavicular, and axillary nodes normal.  Lab Results  Component Value Date   HGBA1C 7.2 (H) 12/24/2016   HGBA1C 7.0 (H) 06/26/2016   HGBA1C 6.9 (H) 12/30/2015    Lab Results  Component Value Date   CREATININE 0.79 12/24/2016   CREATININE 0.70 06/26/2016   CREATININE 0.66 12/30/2015    Lab Results  Component Value Date   WBC 4.0 12/24/2016   HGB 14.0 12/24/2016   HCT 42.2 12/24/2016   PLT 225.0 12/24/2016   GLUCOSE 147 (H) 12/24/2016   CHOL 129 12/24/2016   TRIG 80.0 12/24/2016   HDL 52.10 12/24/2016   LDLDIRECT 61.0 12/24/2016   LDLCALC 61 12/24/2016   ALT 21 12/24/2016   AST 29 12/24/2016   NA 138 12/24/2016   K 4.2 12/24/2016   CL 103 12/24/2016   CREATININE 0.79 12/24/2016   BUN 14 12/24/2016   CO2 29 12/24/2016   TSH 2.34 12/24/2016   HGBA1C 7.2 (H) 12/24/2016   MICROALBUR <0.7 12/30/2015    No results found.  Assessment & Plan:   Problem List Items Addressed This Visit    Diabetes mellitus with no complication ([place]) - Primary    Excellent control,  With recurrent hypoglycemia noted.   Reducing dose of glipizide to 2.5 mg bid and advised to check post prandials for the next 4 weeks to determine if a change in medication is needed .   Lab Results  Component Value Date   HGBA1C 7.2 (H) 12/24/2016         Relevant Orders   Hemoglobin A1c   Fatty liver disease, nonalcoholic   Relevant Orders   Comprehensive metabolic panel   Hypertension    Well controlled on current regimen. Renal function stable, no changes today.  Lab Results  Component Value Date   CREATININE 0.79 12/24/2016   Lab Results  Component Value Date   NA 138 12/24/2016   K 4.2 12/24/2016  CL 103 12/24/2016   CO2 29 12/24/2016          Spasm of thoracic back muscle    Left sided,  Aggravated by lifting her granddaughter.  Advised to refrain from stopping and bending over.  Use NSAIDs and ice.          I am having Ms. Goffredo maintain her fish oil-omega-3 fatty acids, vitamin C, vitamin E, multivitamin, aspirin, calcium-vitamin D, blood glucose meter kit and supplies, (Methotrexate, Anti-Rheumatic, (METHOTREXATE, PF, [place])), bisacodyl, traMADol, famotidine, ACCU-CHEK AVIVA PLUS, B-D INS SYR ULTRAFINE 1CC/31G, losartan, glipiZIDE, and atorvastatin.  No orders of the defined types were placed in this encounter.   There are no discontinued medications.  Follow-up: Return in about 3 months (around 03/31/2017) for follow up diabetes,  LABS AFTER NOV 9 .   Katina Remick, Aris Everts, MD

## 2016-12-31 NOTE — Patient Instructions (Addendum)
USE YOUR PILL CUTTER AND REDUCE GLIPIZIDE TO 2.5  MG TWICE DAILY   CHECK FASTINGS AND  2 hours after your ice cream treat   Check WHENEVER YOUR FEEL BAD  Check to see if your yogurt is  < 15 carbs   Your back pain is from left sided muscle spasm   Use the ibuprofen 600 mg every 8 hours and add tylenol 500 mg every eight hours  Avoid lifting anything over 5 lbs.    Continue ellipticglider  If back is no better in 2 weeks,  Let me know

## 2017-01-03 ENCOUNTER — Encounter: Payer: Self-pay | Admitting: Internal Medicine

## 2017-01-03 DIAGNOSIS — M62830 Muscle spasm of back: Secondary | ICD-10-CM | POA: Insufficient documentation

## 2017-01-03 NOTE — Assessment & Plan Note (Signed)
Well controlled on current regimen. Renal function stable, no changes today.  Lab Results  Component Value Date   CREATININE 0.79 12/24/2016   Lab Results  Component Value Date   NA 138 12/24/2016   K 4.2 12/24/2016   CL 103 12/24/2016   CO2 29 12/24/2016

## 2017-01-03 NOTE — Assessment & Plan Note (Signed)
Left sided,  Aggravated by lifting her granddaughter.  Advised to refrain from stopping and bending over.  Use NSAIDs and ice.

## 2017-01-03 NOTE — Assessment & Plan Note (Addendum)
Excellent control,  With recurrent hypoglycemia noted.  Reducing dose of glipizide to 2.5 mg bid and advised to check post prandials for the next 4 weeks to determine if a change in medication is needed .   Lab Results  Component Value Date   HGBA1C 7.2 (H) 12/24/2016

## 2017-01-04 ENCOUNTER — Ambulatory Visit: Payer: Medicare Other | Admitting: Internal Medicine

## 2017-01-18 ENCOUNTER — Other Ambulatory Visit: Payer: Self-pay | Admitting: Internal Medicine

## 2017-03-12 ENCOUNTER — Ambulatory Visit (INDEPENDENT_AMBULATORY_CARE_PROVIDER_SITE_OTHER): Payer: Medicare Other | Admitting: Internal Medicine

## 2017-03-12 ENCOUNTER — Encounter: Payer: Self-pay | Admitting: Internal Medicine

## 2017-03-12 VITALS — BP 126/82 | HR 68 | Temp 97.4°F | Resp 15 | Ht 66.0 in | Wt 186.6 lb

## 2017-03-12 DIAGNOSIS — R079 Chest pain, unspecified: Secondary | ICD-10-CM

## 2017-03-12 DIAGNOSIS — K2900 Acute gastritis without bleeding: Secondary | ICD-10-CM

## 2017-03-12 DIAGNOSIS — E119 Type 2 diabetes mellitus without complications: Secondary | ICD-10-CM

## 2017-03-12 DIAGNOSIS — R0789 Other chest pain: Secondary | ICD-10-CM | POA: Diagnosis not present

## 2017-03-12 MED ORDER — TRAMADOL HCL 50 MG PO TABS: 50.0000 mg | ORAL_TABLET | Freq: Three times a day (TID) | ORAL | 0 refills | Status: DC | PRN

## 2017-03-12 MED ORDER — PANTOPRAZOLE SODIUM 40 MG PO TBEC: 40.0000 mg | DELAYED_RELEASE_TABLET | Freq: Every day | ORAL | 3 refills | Status: DC

## 2017-03-12 NOTE — Progress Notes (Signed)
Subjective:  Patient ID: [name], female    DOB: 04-Aug-1945  Age: 71 y.o. MRN: [id number]  CC: The primary encounter diagnosis was Other chest pain. Diagnoses of Chest pain at rest, Diabetes mellitus with no complication ([name]), and Other acute gastritis without hemorrhage were also pertinent to this visit.  HPI [name] presents for evaluation of  persistent nausea for 4 weeks.  .  Lasts all day. Sometimes present upon awakening. Takes pepcid daily and prn  tums   Had stopped taking Nexium  Due to insurance .  Feels bloated and gassy after eating.  Burps a lot.  consipation managed with stool softener and metamucil   Trouble sleeping throught the night  to IT band pain . Stretching and rolling/ Has been taking Aleve daily  fo rthe last several weeks Only 4  Times last week,     Also having  Left sided jaw pain and chest pain for the past week, occurring randomly, at rest , occly acc by diaphoresis. Sometimes the burping relieves the chest pain .  Tired more than usual lately   Remote history of cardiac cath,  Doesn't remember who did ,it or when but was done at [facility].   Cardiac risk factors include diabetes,  Age  , hypertension , hyperlipidemia   Outpatient Medications Prior to Visit  Medication Sig Dispense Refill  . ACCU-CHEK AVIVA PLUS test strip TEST CBG THREE TIMES DAILY, E11.65 300 each 12  . aspirin 81 MG tablet Take 1 tablet (81 mg total) by mouth daily. 30 tablet 2  . atorvastatin (LIPITOR) 80 MG tablet TAKE 1 TABLET (80 MG TOTAL) BY MOUTH DAILY. 90 tablet 1  . B-D INS SYR ULTRAFINE 1CC/31G 31G X 5/16" 1 ML MISC USE TO INJECT INSULIN TWICE DAILY 100 each 3  . bisacodyl (DULCOLAX) 5 MG EC tablet Take 5 mg by mouth daily as needed for moderate constipation.    . blood glucose meter kit and supplies KIT Dispense based on patient and insurance preference. Use up to four times daily as directed. (FOR ICD-9 250.00, 250.01). 1 each 0  . calcium-vitamin D (OSCAL WITH D) 250-125  MG-UNIT per tablet Take 2 tablets by mouth daily.    . famotidine (PEPCID) 40 MG tablet TAKE 1 TABLET (40 MG TOTAL) BY MOUTH DAILY. 90 tablet 1  . fish oil-omega-3 fatty acids 1000 MG capsule Take 2 g by mouth 2 (two) times daily.      Marland Kitchen glipiZIDE (GLUCOTROL) 5 MG tablet TAKE 1 TABLET (5 MG TOTAL) BY MOUTH 2 (TWO) TIMES DAILY BEFORE A MEAL. 180 tablet 1  . losartan (COZAAR) 50 MG tablet TAKE 1 TABLET (50 MG TOTAL) BY MOUTH DAILY. 90 tablet 1  . Methotrexate, Anti-Rheumatic, (METHOTREXATE, PF, [place]) Inject 0.7 mLs into the skin once a week.     . Multiple Vitamin (MULTIVITAMIN) tablet Take 1 tablet by mouth daily.    . vitamin C (ASCORBIC ACID) 500 MG tablet Take 500 mg by mouth daily.    . vitamin E 100 UNIT capsule Take 100 Units by mouth daily.    . traMADol (ULTRAM) 50 MG tablet Take 1 tablet (50 mg total) by mouth every 12 (twelve) hours as needed. (Patient not taking: Reported on 03/12/2017) 60 tablet 3   No facility-administered medications prior to visit.     Review of Systems;  Patient denies headache, fevers, malaise, unintentional weight loss, skin rash, eye pain, sinus congestion and sinus pain, sore throat, dysphagia,  hemoptysis , cough, dyspnea, wheezing, chest pain, palpitations, orthopnea, edema, abdominal pain, nausea, melena, diarrhea, constipation, flank pain, dysuria, hematuria, urinary  Frequency, nocturia, numbness, tingling, seizures,  Focal weakness, Loss of consciousness,  Tremor, insomnia, depression, anxiety, and suicidal ideation.      Objective:  BP 126/82 (BP Location: Left Arm, Patient Position: Sitting, Cuff Size: Normal)   Pulse 68   Temp (!) 97.4 F (36.3 C) (Oral)   Resp 15   Ht 5' 6"  (1.676 m)   Wt 186 lb 9.6 oz (84.6 kg)   SpO2 98%   BMI 30.12 kg/m   BP Readings from Last 3 Encounters:  03/12/17 126/82  12/31/16 126/78  07/03/16 130/90    Wt Readings from Last 3 Encounters:  03/12/17 186 lb 9.6 oz (84.6 kg)  12/31/16 183 lb 9.6 oz (83.3 kg)    07/03/16 188 lb (85.3 kg)    General appearance: alert, cooperative and appears stated age Ears: normal TM's and external ear canals both ears Throat: lips, mucosa, and tongue normal; teeth and gums normal Neck: no adenopathy, no carotid bruit, supple, symmetrical, trachea midline and thyroid not enlarged, symmetric, no tenderness/mass/nodules Back: symmetric, no curvature. ROM normal. No CVA tenderness. Lungs: clear to auscultation bilaterally Heart: regular rate and rhythm, S1, S2 normal, no murmur, click, rub or gallop Abdomen: soft, non-tender; bowel sounds normal; no masses,  no organomegaly Pulses: 2+ and symmetric Skin: Skin color, texture, turgor normal. No rashes or lesions Lymph nodes: Cervical, supraclavicular, and axillary nodes normal.  Lab Results  Component Value Date   HGBA1C 7.2 (H) 12/24/2016   HGBA1C 7.0 (H) 06/26/2016   HGBA1C 6.9 (H) 12/30/2015    Lab Results  Component Value Date   CREATININE 0.79 12/24/2016   CREATININE 0.70 06/26/2016   CREATININE 0.66 12/30/2015    Lab Results  Component Value Date   WBC 4.0 12/24/2016   HGB 14.0 12/24/2016   HCT 42.2 12/24/2016   PLT 225.0 12/24/2016   GLUCOSE 147 (H) 12/24/2016   CHOL 129 12/24/2016   TRIG 80.0 12/24/2016   HDL 52.10 12/24/2016   LDLDIRECT 61.0 12/24/2016   LDLCALC 61 12/24/2016   ALT 21 12/24/2016   AST 29 12/24/2016   NA 138 12/24/2016   K 4.2 12/24/2016   CL 103 12/24/2016   CREATININE 0.79 12/24/2016   BUN 14 12/24/2016   CO2 29 12/24/2016   TSH 2.34 12/24/2016   HGBA1C 7.2 (H) 12/24/2016   MICROALBUR <0.7 12/30/2015    No results found.  Assessment & Plan:   Problem List Items Addressed This Visit    Chest pain at rest    She has been havig jar pain with diaphoresis.  Refer to cardiology for risk strratification as she has had none since 2007      Diabetes mellitus with no complication ([name])    Excellent control,  With recurrent hypoglycemia noted.  Reduc dose of  glipizide to 2.5 mg bid and advised to check post prandials for the next 4 weeks to determine if a change in medication is needed .   Lab Results  Component Value Date   HGBA1C 7.2 (H) 12/24/2016         Gastritis    Resuming PPI. May be NSAID induced,  Advised to stop Aleve.        Other Visit Diagnoses    Other chest pain    -  Primary   Relevant Orders   EKG 12-Lead (Completed)   Ambulatory referral to  Cardiology      I have discontinued Ms. Stahlman traMADol. I am also having her start on pantoprazole and traMADol. Additionally, I am having her maintain her fish oil-omega-3 fatty acids, vitamin C, vitamin E, multivitamin, aspirin, calcium-vitamin D, blood glucose meter kit and supplies, (Methotrexate, Anti-Rheumatic, (METHOTREXATE, PF, [place])), bisacodyl, ACCU-CHEK AVIVA PLUS, B-D INS SYR ULTRAFINE 1CC/31G, losartan, glipiZIDE, atorvastatin, and famotidine.  Meds ordered this encounter  Medications  . pantoprazole (PROTONIX) 40 MG tablet    Sig: Take 1 tablet (40 mg total) by mouth daily.    Dispense:  30 tablet    Refill:  3  . traMADol (ULTRAM) 50 MG tablet    Sig: Take 1 tablet (50 mg total) by mouth every 8 (eight) hours as needed.    Dispense:  30 tablet    Refill:  0    Medications Discontinued During This Encounter  Medication Reason  . traMADol (ULTRAM) 50 MG tablet Patient has not taken in last 30 days    Follow-up: No Follow-up on file.   Crecencio Mc, MD

## 2017-03-12 NOTE — Patient Instructions (Addendum)
Your nausea may be due to "gastritis"   This can be caused by Aleve.  Stop taking Aleve.  Stop the pepcid in the morning .  Start the pantoprazole (Protonix) once daily in the morning,  And take the pepcid before dinner , use tylenol (maximal daily dose 1000 mg) and tramadol (prescription ) for your leg pain   Your jaw and chest pain may be angina  Your last cardiac evaluation was BEFORE your developed diabetes and rheumatoid arthritis,  Both can increase your risk for coronary artery disease. . I am making a referral to cardiology to have your heart evaluated   Gastritis, Adult Gastritis is inflammation of the stomach. There are two kinds of gastritis:  Acute gastritis. This kind develops suddenly.  Chronic gastritis. This kind lasts for a long time.  Gastritis happens when the lining of the stomach becomes weak or gets damaged. Without treatment, gastritis can lead to stomach bleeding and ulcers. What are the causes? This condition may be caused by:  An infection.  Drinking too much alcohol.  Certain medicines.  Having too much acid in the stomach.  A disease of the intestines or stomach.  Stress.  What are the signs or symptoms? Symptoms of this condition include:  Pain or a burning in the upper abdomen.  Nausea.  Vomiting.  An uncomfortable feeling of fullness after eating.  In some cases, there are no symptoms. How is this diagnosed? This condition may be diagnosed with:  A description of your symptoms.  A physical exam.  Tests. These can include: ? Blood tests. ? Stool tests. ? A test in which a thin, flexible instrument with a light and camera on the end is passed down the esophagus and into the stomach (upper endoscopy). ? A test in which a sample of tissue is taken for testing (biopsy).  How is this treated? This condition may be treated with medicines. If the condition is caused by a bacterial infection, you may be given antibiotic medicines. If it is  caused by too much acid in the stomach, you may get medicines called H2 blockers, proton pump inhibitors, or antacids. Treatment may also involve stopping the use of certain medicines, such as aspirin, ibuprofen, or other nonsteroidal anti-inflammatory drugs (NSAIDs). Follow these instructions at home:  Take over-the-counter and prescription medicines only as told by your health care provider.  If you were prescribed an antibiotic, take it as told by your health care provider. Do not stop taking the antibiotic even if you start to feel better.  Drink enough fluid to keep your urine clear or pale yellow.  Eat small, frequent meals instead of large meals. Contact a health care provider if:  Your symptoms get worse.  Your symptoms return after treatment. Get help right away if:  You vomit blood or material that looks like coffee grounds.  You have black or dark red stools.  You are unable to keep fluids down.  Your abdominal pain gets worse.  You have a fever.  You do not feel better after 1 week. This information is not intended to replace advice given to you by your health care provider. Make sure you discuss any questions you have with your health care provider. Document Released: 04/28/2001 Document Revised: 01/01/2016 Document Reviewed: 01/26/2015 Elsevier Interactive Patient Education  [employer].

## 2017-03-13 NOTE — Assessment & Plan Note (Addendum)
Resuming PPI. May be NSAID induced,  Advised to stop Aleve.

## 2017-03-13 NOTE — Assessment & Plan Note (Signed)
Excellent control,  With recurrent hypoglycemia noted.  Reduc dose of glipizide to 2.5 mg bid and advised to check post prandials for the next 4 weeks to determine if a change in medication is needed .   Lab Results  Component Value Date   HGBA1C 7.2 (H) 12/24/2016

## 2017-03-13 NOTE — Assessment & Plan Note (Signed)
She has been havig jar pain with diaphoresis.  Refer to cardiology for risk strratification as she has had none since 2007

## 2017-03-18 ENCOUNTER — Encounter: Payer: Self-pay | Admitting: Internal Medicine

## 2017-03-25 ENCOUNTER — Telehealth: Payer: Self-pay

## 2017-03-25 NOTE — Telephone Encounter (Signed)
Copied from [place] [id number]. Topic: Inquiry >> Mar 25, 2017 11:08 AM Patrice Paradise wrote: Reason for CRM: Baker Janus from [facility] called requesting a copy of Patient's EKG Tracing (fax not coming thru clear). Patient will be new to their office on tomorrow. Please call Baker Janus @ [phone]

## 2017-03-25 NOTE — Telephone Encounter (Signed)
EKG sent to [facility] office.  Copied from [place]. Topic: Inquiry >> Mar 25, 2017  9:22 AM Pricilla Handler wrote: Reason for CRM: A lady by the name of Baker Janus called from a doctor's office requesting a copy of Patient's EKG. Patient will be new to their office on tomorrow. I contacted Larena Glassman in the [employer]. Larena Glassman stated that she would fax a copy of the EKG to the doctor's office.

## 2017-04-05 ENCOUNTER — Other Ambulatory Visit (INDEPENDENT_AMBULATORY_CARE_PROVIDER_SITE_OTHER): Payer: Medicare Other

## 2017-04-05 ENCOUNTER — Encounter: Payer: Self-pay | Admitting: Internal Medicine

## 2017-04-05 DIAGNOSIS — E119 Type 2 diabetes mellitus without complications: Secondary | ICD-10-CM | POA: Diagnosis not present

## 2017-04-05 DIAGNOSIS — K760 Fatty (change of) liver, not elsewhere classified: Secondary | ICD-10-CM

## 2017-04-05 LAB — COMPREHENSIVE METABOLIC PANEL
ALT: 18 U/L (ref 0–35)
AST: 27 U/L (ref 0–37)
Albumin: 3.8 g/dL (ref 3.5–5.2)
Alkaline Phosphatase: 99 U/L (ref 39–117)
BUN: 13 mg/dL (ref 6–23)
CO2: 28 mEq/L (ref 19–32)
Calcium: 9.2 mg/dL (ref 8.4–10.5)
Chloride: 102 mEq/L (ref 96–112)
Creatinine, Ser: 0.75 mg/dL (ref 0.40–1.20)
GFR: 81.17 mL/min (ref >60.00)
Glucose, Bld: 175 mg/dL — ABNORMAL HIGH (ref 70–99)
Potassium: 4.2 mEq/L (ref 3.5–5.1)
Sodium: 137 mEq/L (ref 135–145)
Total Bilirubin: 0.9 mg/dL (ref 0.2–1.2)
Total Protein: 6.5 g/dL (ref 6.0–8.3)

## 2017-04-05 LAB — HEMOGLOBIN A1C: Hgb A1c MFr Bld: 7.2 % — ABNORMAL HIGH (ref 4.6–6.5)

## 2017-04-06 ENCOUNTER — Ambulatory Visit: Payer: Medicare Other | Admitting: Obstetrics & Gynecology

## 2017-04-07 ENCOUNTER — Ambulatory Visit: Payer: Medicare Other

## 2017-04-07 ENCOUNTER — Ambulatory Visit: Payer: Medicare Other | Admitting: Internal Medicine

## 2017-04-13 ENCOUNTER — Ambulatory Visit: Payer: Medicare Other | Admitting: Internal Medicine

## 2017-04-13 ENCOUNTER — Ambulatory Visit (INDEPENDENT_AMBULATORY_CARE_PROVIDER_SITE_OTHER): Payer: Medicare Other

## 2017-04-13 ENCOUNTER — Telehealth: Payer: Self-pay | Admitting: Internal Medicine

## 2017-04-13 ENCOUNTER — Encounter: Payer: Self-pay | Admitting: Internal Medicine

## 2017-04-13 ENCOUNTER — Other Ambulatory Visit: Payer: Self-pay | Admitting: Internal Medicine

## 2017-04-13 VITALS — BP 130/70 | HR 76 | Temp 97.8°F | Resp 14 | Ht 66.0 in | Wt 187.8 lb

## 2017-04-13 DIAGNOSIS — M25552 Pain in left hip: Secondary | ICD-10-CM | POA: Diagnosis not present

## 2017-04-13 DIAGNOSIS — K760 Fatty (change of) liver, not elsewhere classified: Secondary | ICD-10-CM | POA: Diagnosis not present

## 2017-04-13 DIAGNOSIS — Z1159 Encounter for screening for other viral diseases: Secondary | ICD-10-CM | POA: Diagnosis not present

## 2017-04-13 DIAGNOSIS — Z0000 Encounter for general adult medical examination without abnormal findings: Secondary | ICD-10-CM | POA: Diagnosis not present

## 2017-04-13 DIAGNOSIS — I10 Essential (primary) hypertension: Secondary | ICD-10-CM | POA: Diagnosis not present

## 2017-04-13 DIAGNOSIS — G8929 Other chronic pain: Secondary | ICD-10-CM | POA: Diagnosis not present

## 2017-04-13 DIAGNOSIS — E119 Type 2 diabetes mellitus without complications: Secondary | ICD-10-CM

## 2017-04-13 DIAGNOSIS — E785 Hyperlipidemia, unspecified: Secondary | ICD-10-CM

## 2017-04-13 DIAGNOSIS — Z1331 Encounter for screening for depression: Secondary | ICD-10-CM | POA: Diagnosis not present

## 2017-04-13 DIAGNOSIS — E7800 Pure hypercholesterolemia, unspecified: Secondary | ICD-10-CM

## 2017-04-13 MED ORDER — ZOSTER VAC RECOMB ADJUVANTED 50 MCG/0.5ML IM SUSR: 0.5000 mL | Freq: Once | INTRAMUSCULAR | 1 refills | Status: AC

## 2017-04-13 NOTE — Progress Notes (Signed)
Subjective:  Patient ID: [name], female    DOB: 23-Jun-1945  Age: 71 y.o. MRN: [id number]  CC: The primary encounter diagnosis was Pain of left hip joint. Diagnoses of Diabetes mellitus with no complication ([place]), Pure hypercholesterolemia, Essential hypertension, Chronic hip pain, left, and Fatty liver disease, nonalcoholic were also pertinent to this visit.  HPI [name] presents for EVALUATION OF WORSENING LEFT HIP PAIN  Has a 15 day trip to the [place] with church group in February  and is worried about pain incurred with walking on the trip.  Hurts with prolonged sitting ,  improves with movement,  But aggravated by going up stairs.  Feels ok in the morning when she wakes, .  hjas pain every day.  Has tried taking . tramadol does not help.  aleve bid did not help either.   3 month follow up on diabetes.  Patient has no complaints today.  Patient is following a low glycemic index diet and taking all prescribed medications regularly without side effects.  Fasting sugars have been under less than 140 most of the time and post prandials have been under 160 except on rare occasions. Patient is exercising about 3 times per week and intentionally trying to lose weight .  Patient has had an eye exam in the last 12 months and checks feet regularly for signs of infection.  Patient does not walk barefoot outside,  And denies an numbness tingling or burning in feet. Patient is up to date on all recommended vaccinations    Lab Results  Component Value Date   HGBA1C 7.2 (H) 04/05/2017    Outpatient Medications Prior to Visit  Medication Sig Dispense Refill  . aspirin 81 MG tablet Take 1 tablet (81 mg total) by mouth daily. 30 tablet 2  . atorvastatin (LIPITOR) 80 MG tablet TAKE 1 TABLET (80 MG TOTAL) BY MOUTH DAILY. 90 tablet 1  . B-D INS SYR ULTRAFINE 1CC/31G 31G X 5/16" 1 ML MISC USE TO INJECT INSULIN TWICE DAILY 100 each 3  . bisacodyl (DULCOLAX) 5 MG EC tablet Take 5 mg by mouth daily  as needed for moderate constipation.    . blood glucose meter kit and supplies KIT Dispense based on patient and insurance preference. Use up to four times daily as directed. (FOR ICD-9 250.00, 250.01). 1 each 0  . calcium-vitamin D (OSCAL WITH D) 250-125 MG-UNIT per tablet Take 2 tablets by mouth daily.    . famotidine (PEPCID) 40 MG tablet TAKE 1 TABLET (40 MG TOTAL) BY MOUTH DAILY. 90 tablet 1  . fish oil-omega-3 fatty acids 1000 MG capsule Take 2 g by mouth 2 (two) times daily.      Marland Kitchen glipiZIDE (GLUCOTROL) 5 MG tablet TAKE 1 TABLET (5 MG TOTAL) BY MOUTH 2 (TWO) TIMES DAILY BEFORE A MEAL. 180 tablet 1  . Methotrexate, Anti-Rheumatic, (METHOTREXATE, PF, [place]) Inject 0.7 mLs into the skin once a week.     . Multiple Vitamin (MULTIVITAMIN) tablet Take 1 tablet by mouth daily.    . pantoprazole (PROTONIX) 40 MG tablet Take 1 tablet (40 mg total) by mouth daily. 30 tablet 3  . traMADol (ULTRAM) 50 MG tablet Take 1 tablet (50 mg total) by mouth every 8 (eight) hours as needed. 30 tablet 0  . vitamin C (ASCORBIC ACID) 500 MG tablet Take 500 mg by mouth daily.    . vitamin E 100 UNIT capsule Take 100 Units by mouth daily.    Marland Kitchen ACCU-CHEK  AVIVA PLUS test strip TEST CBG THREE TIMES DAILY, E11.65 300 each 12  . losartan (COZAAR) 50 MG tablet TAKE 1 TABLET (50 MG TOTAL) BY MOUTH DAILY. 90 tablet 1   No facility-administered medications prior to visit.     Review of Systems;  Patient denies headache, fevers, malaise, unintentional weight loss, skin rash, eye pain, sinus congestion and sinus pain, sore throat, dysphagia,  hemoptysis , cough, dyspnea, wheezing, chest pain, palpitations, orthopnea, edema, abdominal pain, nausea, melena, diarrhea, constipation, flank pain, dysuria, hematuria, urinary  Frequency, nocturia, numbness, tingling, seizures,  Focal weakness, Loss of consciousness,  Tremor, insomnia, depression, anxiety, and suicidal ideation.      Objective:  BP 130/70 (BP Location: Left Arm,  Patient Position: Sitting, Cuff Size: Normal)   Pulse 76   Temp 97.8 F (36.6 C) (Oral)   Resp 14   Ht 5' 6"  (1.676 m)   Wt 187 lb 12.8 oz (85.2 kg)   SpO2 97%   BMI 30.31 kg/m   BP Readings from Last 3 Encounters:  04/13/17 130/70  04/13/17 130/70  03/12/17 126/82    Wt Readings from Last 3 Encounters:  04/13/17 187 lb 12.8 oz (85.2 kg)  04/13/17 187 lb 12.8 oz (85.2 kg)  03/12/17 186 lb 9.6 oz (84.6 kg)    General appearance: alert, cooperative and appears stated age Ears: normal TM's and external ear canals both ears Throat: lips, mucosa, and tongue normal; teeth and gums normal Neck: no adenopathy, no carotid bruit, supple, symmetrical, trachea midline and thyroid not enlarged, symmetric, no tenderness/mass/nodules Back: symmetric, no curvature. ROM normal. No CVA tenderness. Lungs: clear to auscultation bilaterally Heart: regular rate and rhythm, S1, S2 normal, no murmur, click, rub or gallop Abdomen: soft, non-tender; bowel sounds normal; no masses,  no organomegaly Pulses: 2+ and symmetric Skin: Skin color, texture, turgor normal. No rashes or lesions Lymph nodes: Cervical, supraclavicular, and axillary nodes normal.  Lab Results  Component Value Date   HGBA1C 7.2 (H) 04/05/2017   HGBA1C 7.2 (H) 12/24/2016   HGBA1C 7.0 (H) 06/26/2016    Lab Results  Component Value Date   CREATININE 0.75 04/05/2017   CREATININE 0.79 12/24/2016   CREATININE 0.70 06/26/2016    Lab Results  Component Value Date   WBC 4.0 12/24/2016   HGB 14.0 12/24/2016   HCT 42.2 12/24/2016   PLT 225.0 12/24/2016   GLUCOSE 175 (H) 04/05/2017   CHOL 129 12/24/2016   TRIG 80.0 12/24/2016   HDL 52.10 12/24/2016   LDLDIRECT 61.0 12/24/2016   LDLCALC 61 12/24/2016   ALT 18 04/05/2017   AST 27 04/05/2017   NA 137 04/05/2017   K 4.2 04/05/2017   CL 102 04/05/2017   CREATININE 0.75 04/05/2017   BUN 13 04/05/2017   CO2 28 04/05/2017   TSH 2.34 12/24/2016   HGBA1C 7.2 (H) 04/05/2017    MICROALBUR <0.7 12/30/2015    No results found.  Assessment & Plan:   Problem List Items Addressed This Visit    Chronic hip pain, left    Plain films are normal  ROM is normal,  Suspect IT band,  Referral to osports medicine vs Orthopedics advised      Diabetes mellitus with no complication ([place])    Very good  control,  With recurrent hypoglycemia noted during attempts at greater reduction .  Continue  glipizide 5 mg bid and advised to check post prandials for the next 4 weeks to determine if a change in medication is needed .  Lab Results  Component Value Date   HGBA1C 7.2 (H) 04/05/2017   Lab Results  Component Value Date   MICROALBUR <0.7 12/30/2015         Fatty liver disease, nonalcoholic    Secondary to obesity and diabetes/metabolic syndrome. I have addressed  BMI and recommended a low glycemic index diet utilizing smaller more frequent meals to increase metabolism.Her participation in regular  Exercise is ongoing  But limited by left shoulder pain .  Lab Results  Component Value Date   ALT 18 04/05/2017   AST 27 04/05/2017   ALKPHOS 99 04/05/2017   BILITOT 0.9 04/05/2017   Repeat  revaccination for Hep a and B complete       Hyperlipidemia    LDL and triglycerides are at goal on current medications. sHe has no side effects and liver enzymes are normal. No changes today.  Lab Results  Component Value Date   CHOL 129 12/24/2016   HDL 52.10 12/24/2016   LDLCALC 61 12/24/2016   LDLDIRECT 61.0 12/24/2016   TRIG 80.0 12/24/2016   CHOLHDL 2 12/24/2016   Lab Results  Component Value Date   ALT 18 04/05/2017   AST 27 04/05/2017   ALKPHOS 99 04/05/2017   BILITOT 0.9 04/05/2017              Hypertension    Well controlled on current regimen. Renal function stable, no changes today.  Lab Results  Component Value Date   CREATININE 0.75 04/05/2017   Lab Results  Component Value Date   NA 137 04/05/2017   K 4.2 04/05/2017   CL 102 04/05/2017    CO2 28 04/05/2017          Other Visit Diagnoses    Pain of left hip joint    -  Primary   Relevant Orders   DG HIP UNILAT WITH PELVIS 2-3 VIEWS LEFT (Completed)      I am having [name] start on Zoster Vaccine Adjuvanted. I am also having her maintain her fish oil-omega-3 fatty acids, vitamin C, vitamin E, multivitamin, aspirin, calcium-vitamin D, blood glucose meter kit and supplies, (Methotrexate, Anti-Rheumatic, (METHOTREXATE, PF, [place])), bisacodyl, B-D INS SYR ULTRAFINE 1CC/31G, glipiZIDE, atorvastatin, famotidine, pantoprazole, and traMADol.  Meds ordered this encounter  Medications  . Zoster Vaccine Adjuvanted [place]) injection    Sig: Inject 0.5 mLs into the muscle once for 1 dose.    Dispense:  1 each    Refill:  1    There are no discontinued medications.  Follow-up: Return in about 3 months (around 07/14/2017) for follow up diabetes.   Crecencio Mc, MD

## 2017-04-13 NOTE — Patient Instructions (Signed)
I am ordering an x ray of your right hip to assess for degenerative changes  Depending on the x rAY,  EITHER sports medicine or orthopedics referral is advised  Please continue to exercise for 30 minutes 5 days per week

## 2017-04-13 NOTE — Telephone Encounter (Signed)
Pt needs lab work before appt. Order is needed please and thank you!

## 2017-04-13 NOTE — Telephone Encounter (Signed)
Labs have been ordered. Pt needs to be scheduled for a 3 month office visit with Dr. Derrel Nip and a fasting lab appt a few days prior.

## 2017-04-13 NOTE — Patient Instructions (Addendum)
  Ms. Spiewak , Thank you for taking time to come for your Medicare Wellness Visit. I appreciate your ongoing commitment to your health goals. Please review the following plan we discussed and let me know if I can assist you in the future.   These are the goals we discussed: Goals    . Peak Blood Glucose < 180       This is a list of the screening recommended for you and due dates:  Health Maintenance  Topic Date Due  .  Hepatitis C: One time screening is recommended by Center for Disease Control  (CDC) for  adults born from [birth year] through 1964.   [birth year]  . Complete foot exam   07/03/2017  . Hemoglobin A1C  10/03/2017  . Eye exam for diabetics  12/31/2017  . Mammogram  04/01/2018  . Tetanus Vaccine  08/09/2023  . Colon Cancer Screening  02/03/2024  . Flu Shot  Completed  . DEXA scan (bone density measurement)  Completed  . Pneumonia vaccines  Completed

## 2017-04-13 NOTE — Progress Notes (Signed)
Subjective:   [name] is a 71 y.o. female who presents for Medicare Annual (Subsequent) preventive examination.  Review of Systems:  No ROS.  Medicare Wellness Visit. Additional risk factors are reflected in the social history.  Cardiac Risk Factors include: advanced age (>35mn, >>74women);hypertension;diabetes mellitus     Objective:     Vitals: BP 130/70 (BP Location: Left Arm, Patient Position: Sitting, Cuff Size: Normal)   Pulse 76   Temp 97.8 F (36.6 C) (Oral)   Resp 14   Ht _0  (1.676 m)   Wt 187 lb 12.8 oz (85.2 kg)   SpO2 97%   BMI 30.31 kg/m   Body mass index is 30.31 kg/m.   Tobacco Social History   Tobacco Use  Smoking Status Never Smoker  Smokeless Tobacco Never Used     Counseling given: Not Answered   Past Medical History:  Diagnosis Date  . Diabetes mellitus   . Gastritis   . High cholesterol   . Hyperlipidemia    Lipitor trial was a failure  . Hypertension    Past Surgical History:  Procedure Laterality Date  . CARDIAC CATHETERIZATION  Augus 2007   normal coronaries,  EF 60%  . [place] . SPINE SURGERY     bone graft   . TUBAL LIGATION     Family History  Problem Relation Age of Onset  . Diabetes Mother   . Heart disease Father   . Stroke Father   . Diabetes Father   . Diabetes Maternal Grandfather   . Diabetes Brother   . Diabetes Brother    Social History   Substance and Sexual Activity  Sexual Activity Yes  . Partners: Male  . Birth control/protection: None    Outpatient Encounter Medications as of 04/13/2017  Medication Sig  . aspirin 81 MG tablet Take 1 tablet (81 mg total) by mouth daily.  .Marland Kitchenatorvastatin (LIPITOR) 80 MG tablet TAKE 1 TABLET (80 MG TOTAL) BY MOUTH DAILY.  .Marland KitchenB-D INS SYR ULTRAFINE 1CC/31G 31G X 5/16" 1 ML MISC USE TO INJECT INSULIN TWICE DAILY  . bisacodyl (DULCOLAX) 5 MG EC tablet Take 5 mg by mouth daily as needed for moderate constipation.  . blood glucose meter kit and  supplies KIT Dispense based on patient and insurance preference. Use up to four times daily as directed. (FOR ICD-9 250.00, 250.01).  . calcium-vitamin D (OSCAL WITH D) 250-125 MG-UNIT per tablet Take 2 tablets by mouth daily.  . famotidine (PEPCID) 40 MG tablet TAKE 1 TABLET (40 MG TOTAL) BY MOUTH DAILY.  . fish oil-omega-3 fatty acids 1000 MG capsule Take 2 g by mouth 2 (two) times daily.    .Marland KitchenglipiZIDE (GLUCOTROL) 5 MG tablet TAKE 1 TABLET (5 MG TOTAL) BY MOUTH 2 (TWO) TIMES DAILY BEFORE A MEAL.  .Marland Kitchenlosartan (COZAAR) 50 MG tablet TAKE 1 TABLET (50 MG TOTAL) BY MOUTH DAILY.  .Marland KitchenMethotrexate, Anti-Rheumatic, (METHOTREXATE, PF, [place]) Inject 0.7 mLs into the skin once a week.   . Multiple Vitamin (MULTIVITAMIN) tablet Take 1 tablet by mouth daily.  . pantoprazole (PROTONIX) 40 MG tablet Take 1 tablet (40 mg total) by mouth daily.  . traMADol (ULTRAM) 50 MG tablet Take 1 tablet (50 mg total) by mouth every 8 (eight) hours as needed.  . vitamin C (ASCORBIC ACID) 500 MG tablet Take 500 mg by mouth daily.  . vitamin E 100 UNIT capsule Take 100 Units by mouth daily.  . [DISCONTINUED]  ACCU-CHEK AVIVA PLUS test strip TEST CBG THREE TIMES DAILY, E11.65   No facility-administered encounter medications on file as of 04/13/2017.     Activities of Daily Living In your present state of health, do you have any difficulty performing the following activities: 04/13/2017  Hearing? N  Vision? N  Difficulty concentrating or making decisions? N  Walking or climbing stairs? Y  Dressing or bathing? N  Doing errands, shopping? N  Preparing Food and eating ? N  Using the Toilet? N  In the past six months, have you accidently leaked urine? N  Do you have problems with loss of bowel control? N  Managing your Medications? N  Managing your Finances? N  Housekeeping or managing your Housekeeping? N  Some recent data might be hidden    Patient Care Team: Crecencio Mc, MD as PCP - General (Internal  Medicine) Emmaline Kluver., MD (Rheumatology) Osborne Oman, MD as Consulting Physician (Obstetrics and Gynecology)    Assessment:    This is a routine wellness examination for [name].  The goal of the wellness visit is to assist the patient how to close the gaps in care and create a preventative care plan for the patient.   The roster of all physicians providing medical care to patient is listed in the Snapshot section of the chart.  Taking calcium VIT D as appropriate/Osteoporosis risk reviewed.    Safety issues reviewed; Smoke and carbon monoxide detectors in the home. No firearms in the home.  Wears seatbelts when driving or riding with others. Patient does wear sunscreen or protective clothing when in direct sunlight. No violence in the home.  Depression- PHQ 2 &9 complete.  No signs/symptoms or verbal communication regarding little pleasure in doing things, feeling down, depressed or hopeless. No changes in sleeping, energy, eating, concentrating.  No thoughts of self harm or harm towards others.  Time spent on this topic is 8 minutes.   Patient is alert, normal appearance, oriented to person/place/and time. Correctly identified the president of the [place], recall of 2/3 words, and performing simple calculations. Displays appropriate judgement and can read correct time from watch face.   No new identified risk were noted.  No failures at ADL's or IADL's.    BMI- discussed the importance of a healthy diet, water intake and the benefits of aerobic exercise. Educational material provided.   24 hour diet recall: Breakfast: Cereal, fruit Lunch: Protein shake, rice cake, peanut butter, honey Dinner: Fried green tomato sandwich, soup Snack: ice cream  Daily fluid intake: 0 cups of caffeine, 5 cups of water  Dental- every 6 months. Upper dental implant.  [facility])  Eye- Visual acuity not assessed per patient preference since they have regular follow up with the ophthalmologist.   Wears corrective lenses.  Sleep patterns- Sleeps 6-7 hours at night.  Wakes feeling rested.  Hepatitis C Screening discussed; follow as directed.  Patient Concerns: None at this time. Follow up with PCP as needed.  Exercise Activities and Dietary recommendations Current Exercise Habits: Structured exercise class, Type of exercise: walking;calisthenics, Time (Minutes): 45, Frequency (Times/Week): 5, Weekly Exercise (Minutes/Week): 225, Intensity: Mild  Goals    . Peak Blood Glucose < 180      Fall Risk Fall Risk  04/13/2017 03/12/2017 01/01/2016 09/12/2014 11/08/2013  Falls in the past year? _0    Depression Screen PHQ 2/9 Scores 04/13/2017 03/12/2017 01/01/2016 09/12/2014  PHQ - 2 Score 0 3 0 1  PHQ- 9 Score  0 10 - -     Cognitive Function MMSE - Mini Mental State Exam 04/13/2017 01/01/2016 09/12/2014  Orientation to time _0 Orientation to Place _1 Registration _2 Attention/ Calculation _3 Recall _4 Language- name 2 objects _5 Language- repeat _6 Language- follow 3 step command _7 Language- read & follow direction _8 Write a sentence _9 Copy design _10 Total score _11 Immunization History  Administered Date(s) Administered  . Hep A / Hep B 01/01/2016, 02/04/2016, 07/03/2016  . Influenza Split 03/03/2012, 01/26/2013, 02/25/2015  . Influenza, High Dose Seasonal PF 02/20/2016  . Influenza,inj,Quad PF,6+ Mos 02/13/2014  . Influenza-Unspecified 02/19/2017  . Pneumococcal Conjugate-13 11/08/2013  . Pneumococcal Polysaccharide-23 03/03/2012  . Tdap 08/08/2013  . Zoster 04/20/2008   Screening Tests Health Maintenance  Topic Date Due  . Hepatitis C Screening  1946/03/25  . FOOT EXAM  07/03/2017  . HEMOGLOBIN A1C  10/03/2017  . OPHTHALMOLOGY EXAM  12/31/2017  . MAMMOGRAM  04/01/2018  . TETANUS/TDAP  08/09/2023  . COLONOSCOPY  02/03/2024  . INFLUENZA VACCINE  Completed  . DEXA SCAN  Completed  . PNA  vac Low Risk Adult  Completed      Plan:   End of life planning; Advanced aging; Advanced directives discussed.  No HCPOA/Living Will.  Additional information declined at this time.  I have personally reviewed and noted the following in the patient's chart:   . Medical and social history . Use of alcohol, tobacco or illicit drugs  . Current medications and supplements . Functional ability and status . Nutritional status . Physical activity . Advanced directives . List of other physicians . Hospitalizations, surgeries, and ER visits in previous 12 months . Vitals . Screenings to include cognitive, depression, and falls . Referrals and appointments  In addition, I have reviewed and discussed with patient certain preventive protocols, quality metrics, and best practice recommendations. A written personalized care plan for preventive services as well as general preventive health recommendations were provided to patient.     OBrien-Blaney, Theordore Cisnero L, LPN  [id number]   I have reviewed the above information and agree with above.   Deborra Medina, MD

## 2017-04-14 ENCOUNTER — Other Ambulatory Visit: Payer: Self-pay | Admitting: Internal Medicine

## 2017-04-14 ENCOUNTER — Encounter: Payer: Self-pay | Admitting: Internal Medicine

## 2017-04-15 DIAGNOSIS — M25552 Pain in left hip: Secondary | ICD-10-CM

## 2017-04-15 DIAGNOSIS — G8929 Other chronic pain: Secondary | ICD-10-CM | POA: Insufficient documentation

## 2017-04-15 NOTE — Assessment & Plan Note (Signed)
Secondary to obesity and diabetes/metabolic syndrome. I have addressed  BMI and recommended a low glycemic index diet utilizing smaller more frequent meals to increase metabolism.Her participation in regular  Exercise is ongoing  But limited by left shoulder pain .  Lab Results  Component Value Date   ALT 18 04/05/2017   AST 27 04/05/2017   ALKPHOS 99 04/05/2017   BILITOT 0.9 04/05/2017   Repeat  revaccination for Hep a and B complete

## 2017-04-15 NOTE — Assessment & Plan Note (Signed)
Well controlled on current regimen. Renal function stable, no changes today.  Lab Results  Component Value Date   CREATININE 0.75 04/05/2017   Lab Results  Component Value Date   NA 137 04/05/2017   K 4.2 04/05/2017   CL 102 04/05/2017   CO2 28 04/05/2017

## 2017-04-15 NOTE — Assessment & Plan Note (Signed)
LDL and triglycerides are at goal on current medications. sHe has no side effects and liver enzymes are normal. No changes today.  Lab Results  Component Value Date   CHOL 129 12/24/2016   HDL 52.10 12/24/2016   LDLCALC 61 12/24/2016   LDLDIRECT 61.0 12/24/2016   TRIG 80.0 12/24/2016   CHOLHDL 2 12/24/2016   Lab Results  Component Value Date   ALT 18 04/05/2017   AST 27 04/05/2017   ALKPHOS 99 04/05/2017   BILITOT 0.9 04/05/2017

## 2017-04-15 NOTE — Assessment & Plan Note (Signed)
Plain films are normal  ROM is normal,  Suspect IT band,  Referral to osports medicine vs Orthopedics advised

## 2017-04-15 NOTE — Assessment & Plan Note (Signed)
Very good  control,  With recurrent hypoglycemia noted during attempts at greater reduction .  Continue  glipizide 5 mg bid and advised to check post prandials for the next 4 weeks to determine if a change in medication is needed .   Lab Results  Component Value Date   HGBA1C 7.2 (H) 04/05/2017   Lab Results  Component Value Date   MICROALBUR <0.7 12/30/2015

## 2017-04-19 NOTE — Progress Notes (Signed)
GYNECOLOGY ANNUAL PREVENTATIVE CARE ENCOUNTER NOTE  Subjective:   [name] is a 71 y.o. [phone] female here for a routine annual gynecologic exam.  Current complaints: none.   Denies abnormal vaginal bleeding, discharge, pelvic pain, or other gynecologic concerns.    Gynecologic History No LMP recorded. Patient is postmenopausal. Normal cytology on pap smears, but positive high risk HPV in 02/28/13, 03/08/14, 03/22/15 and 03/24/16. 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  Had cryotherapy on 04/2016.  Last mammogram: 2017 Results were: normal  Obstetric History OB History  Gravida Para Term Preterm AB Living  _0 0 0 2  SAB TAB Ectopic Multiple Live Births  0 0 0 0 2    # Outcome Date GA Lbr Len/2nd Weight Sex Delivery Anes PTL Lv  2 Term     M Vag-Spont   LIV  1 Term     M Vag-Spont   LIV      Past Medical History:  Diagnosis Date  . Diabetes mellitus   . Gastritis   . High cholesterol   . Hyperlipidemia    Lipitor trial was a failure  . Hypertension     Past Surgical History:  Procedure Laterality Date  . CARDIAC CATHETERIZATION  Augus 2007   normal coronaries,  EF 60%  . [place]  . SPINE SURGERY     bone graft   . TUBAL LIGATION      Current Outpatient Medications on File Prior to Visit  Medication Sig Dispense Refill  . ACCU-CHEK AVIVA PLUS test strip TEST CBG THREE TIMES DAILY, E11.65 100 each 2  . aspirin 81 MG tablet Take 1 tablet (81 mg total) by mouth daily. 30 tablet 2  . atorvastatin (LIPITOR) 80 MG tablet TAKE 1 TABLET (80 MG TOTAL) BY MOUTH DAILY. 90 tablet 1  . B-D INS SYR ULTRAFINE 1CC/31G 31G X 5/16" 1 ML MISC USE TO INJECT INSULIN TWICE DAILY 100 each 3  . bisacodyl (DULCOLAX) 5 MG EC tablet Take 5 mg by mouth daily as needed for moderate constipation.    . blood glucose meter kit and supplies KIT Dispense based on patient and insurance preference. Use up to four times daily  as directed. (FOR ICD-9 250.00, 250.01). 1 each 0  . calcium-vitamin D (OSCAL WITH D) 250-125 MG-UNIT per tablet Take 2 tablets by mouth daily.    . famotidine (PEPCID) 40 MG tablet TAKE 1 TABLET (40 MG TOTAL) BY MOUTH DAILY. 90 tablet 1  . fish oil-omega-3 fatty acids 1000 MG capsule Take 2 g by mouth 2 (two) times daily.      Marland Kitchen glipiZIDE (GLUCOTROL) 5 MG tablet TAKE 1 TABLET (5 MG TOTAL) BY MOUTH 2 (TWO) TIMES DAILY BEFORE A MEAL. 180 tablet 1  . losartan (COZAAR) 50 MG tablet TAKE 1 TABLET (50 MG TOTAL) BY MOUTH DAILY. 90 tablet 1  . Methotrexate, Anti-Rheumatic, (METHOTREXATE, PF, [place]) Inject 0.7 mLs into the skin once a week.     . Multiple Vitamin (MULTIVITAMIN) tablet Take 1 tablet by mouth daily.    . pantoprazole (PROTONIX) 40 MG tablet Take 1 tablet (40 mg total) by mouth daily. 30 tablet 3  . traMADol (ULTRAM) 50 MG tablet Take 1 tablet (50 mg total) by mouth every 8 (eight) hours as needed. 30 tablet 0  . vitamin C (ASCORBIC ACID) 500 MG tablet Take 500 mg by mouth daily.    . vitamin  E 100 UNIT capsule Take 100 Units by mouth daily.    . [DISCONTINUED] ferrous sulfate 325 (65 FE) MG tablet Take 325 mg by mouth daily with breakfast.      . [DISCONTINUED] omeprazole (PRILOSEC) 40 MG capsule Take 1 capsule (40 mg total) by mouth daily. 30 capsule 1   No current facility-administered medications on file prior to visit.     Allergies  Allergen Reactions  . Amoxicillin Hives  . Latex Rash    Social History   Socioeconomic History  . Marital status: Married    Spouse name: Not on file  . Number of children: Not on file  . Years of education: Not on file  . Highest education level: Not on file  Social Needs  . Financial resource strain: Not hard at all  . Food insecurity - worry: Never true  . Food insecurity - inability: Never true  . Transportation needs - medical: No  . Transportation needs - non-medical: No  Occupational History  . Occupation: Programmer, multimedia: [employer]    Comment: Retired 2014  Tobacco Use  . Smoking status: Never Smoker  . Smokeless tobacco: Never Used  Substance and Sexual Activity  . Alcohol use: No  . Drug use: No  . Sexual activity: Yes    Partners: Male    Birth control/protection: None  Other Topics Concern  . Not on file  Social History Narrative  . Not on file    Family History  Problem Relation Age of Onset  . Diabetes Mother   . Heart disease Father   . Stroke Father   . Diabetes Father   . Diabetes Maternal Grandfather   . Diabetes Brother   . Diabetes Brother     The following portions of the patient's history were reviewed and updated as appropriate: allergies, current medications, past family history, past medical history, past social history, past surgical history and problem list.  Review of Systems Pertinent items noted in HPI and remainder of comprehensive ROS otherwise negative.   Objective:  BP (!) 153/93   Pulse 77   Wt 187 lb 9.6 oz (85.1 kg)   BMI 30.28 kg/m  CONSTITUTIONAL: Well-developed, well-nourished female in no acute distress.  HENT:  Normocephalic, atraumatic, External right and left ear normal. Oropharynx is clear and moist EYES: Conjunctivae and EOM are normal. Pupils are equal, round, and reactive to light. No scleral icterus.  NECK: Normal range of motion, supple, no masses.  Normal thyroid.  SKIN: Skin is warm and dry. No rash noted. Not diaphoretic. No erythema. No pallor. NEUROLOGIC: Alert and oriented to person, place, and time. Normal reflexes, muscle tone coordination. No cranial nerve deficit noted. PSYCHIATRIC: Normal mood and affect. Normal behavior. Normal judgment and thought content. CARDIOVASCULAR: Normal heart rate noted, regular rhythm RESPIRATORY: Clear to auscultation bilaterally. Effort and breath sounds normal, no problems with respiration noted. BREASTS: Symmetric in size. No masses, skin changes, nipple drainage, or lymphadenopathy. ABDOMEN: Soft, normal  bowel sounds, no distention noted.  No tenderness, rebound or guarding.  PELVIC: Normal appearing external genitalia; normal appearing vaginal mucosa with moderate atrophy and cervix. No abnormal discharge noted. Pap smear obtained. Normal uterine size, no other palpable masses, no uterine or adnexal tenderness. MUSCULOSKELETAL: Normal range of motion. No tenderness.  No cyanosis, clubbing, or edema.  2+ distal pulses.   Assessment and Plan:  1. Well woman exam with routine gynecological exam - Cytology - PAP - MM SCREENING BREAST TOMO  BILATERAL; Future - DG Bone Density; Future Will follow up results of pap smear and manage accordingly. Mammogram and bone mineral density scanjj scheduled Routine preventative health maintenance measures emphasized. Please refer to After Visit Summary for other counseling recommendations.    Verita Schneiders, MD, [place] Attending Obstetrician & Gynecologist, [place] for [facility]

## 2017-04-20 ENCOUNTER — Other Ambulatory Visit (HOSPITAL_COMMUNITY)
Admission: RE | Admit: 2017-04-20 | Discharge: 2017-04-20 | Disposition: A | Discharge status: Home / Self Care | Payer: Medicare Other | Source: Ambulatory Visit | Attending: Obstetrics & Gynecology | Admitting: Obstetrics & Gynecology

## 2017-04-20 ENCOUNTER — Encounter: Payer: Self-pay | Admitting: Obstetrics & Gynecology

## 2017-04-20 ENCOUNTER — Ambulatory Visit (INDEPENDENT_AMBULATORY_CARE_PROVIDER_SITE_OTHER): Payer: Medicare Other | Admitting: Obstetrics & Gynecology

## 2017-04-20 VITALS — BP 153/93 | HR 77 | Wt 187.6 lb

## 2017-04-20 DIAGNOSIS — Z01419 Encounter for gynecological examination (general) (routine) without abnormal findings: Secondary | ICD-10-CM | POA: Insufficient documentation

## 2017-04-20 NOTE — Patient Instructions (Signed)
Preventive Care 65 Years and Older, Female Preventive care refers to lifestyle choices and visits with your health care provider that can promote health and wellness. What does preventive care include?  A yearly physical exam. This is also called an annual well check.  Dental exams once or twice a year.  Routine eye exams. Ask your health care provider how often you should have your eyes checked.  Personal lifestyle choices, including: ? Daily care of your teeth and gums. ? Regular physical activity. ? Eating a healthy diet. ? Avoiding tobacco and drug use. ? Limiting alcohol use. ? Practicing safe sex. ? Taking low-dose aspirin every day. ? Taking vitamin and mineral supplements as recommended by your health care provider. What happens during an annual well check? The services and screenings done by your health care provider during your annual well check will depend on your age, overall health, lifestyle risk factors, and family history of disease. Counseling Your health care provider may ask you questions about your:  Alcohol use.  Tobacco use.  Drug use.  Emotional well-being.  Home and relationship well-being.  Sexual activity.  Eating habits.  History of falls.  Memory and ability to understand (cognition).  Work and work environment.  Reproductive health.  Screening You may have the following tests or measurements:  Height, weight, and BMI.  Blood pressure.  Lipid and cholesterol levels. These may be checked every 5 years, or more frequently if you are over 50 years old.  Skin check.  Lung cancer screening. You may have this screening every year starting at age 55 if you have a 30-pack-year history of smoking and currently smoke or have quit within the past 15 years.  Fecal occult blood test (FOBT) of the stool. You may have this test every year starting at age 50.  Flexible sigmoidoscopy or colonoscopy. You may have a sigmoidoscopy every 5 years or  a colonoscopy every 10 years starting at age 50.  Hepatitis C blood test.  Hepatitis B blood test.  Sexually transmitted disease (STD) testing.  Diabetes screening. This is done by checking your blood sugar (glucose) after you have not eaten for a while (fasting). You may have this done every 1-3 years.  Bone density scan. This is done to screen for osteoporosis. You may have this done starting at age 65.  Mammogram. This may be done every 1-2 years. Talk to your health care provider about how often you should have regular mammograms.  Talk with your health care provider about your test results, treatment options, and if necessary, the need for more tests. Vaccines Your health care provider may recommend certain vaccines, such as:  Influenza vaccine. This is recommended every year.  Tetanus, diphtheria, and acellular pertussis (Tdap, Td) vaccine. You may need a Td booster every 10 years.  Varicella vaccine. You may need this if you have not been vaccinated.  Zoster vaccine. You may need this after age 60.  Measles, mumps, and rubella (MMR) vaccine. You may need at least one dose of MMR if you were born in 1957 or later. You may also need a second dose.  Pneumococcal 13-valent conjugate (PCV13) vaccine. One dose is recommended after age 65.  Pneumococcal polysaccharide (PPSV23) vaccine. One dose is recommended after age 65.  Meningococcal vaccine. You may need this if you have certain conditions.  Hepatitis A vaccine. You may need this if you have certain conditions or if you travel or work in places where you may be exposed to hepatitis   A.  Hepatitis B vaccine. You may need this if you have certain conditions or if you travel or work in places where you may be exposed to hepatitis B.  Haemophilus influenzae type b (Hib) vaccine. You may need this if you have certain conditions.  Talk to your health care provider about which screenings and vaccines you need and how often you  need them. This information is not intended to replace advice given to you by your health care provider. Make sure you discuss any questions you have with your health care provider. Document Released: 05/31/2015 Document Revised: 01/22/2016 Document Reviewed: 03/05/2015 Elsevier Interactive Patient Education  2017 [employer].

## 2017-04-23 LAB — CYTOLOGY - PAP
Diagnosis: NEGATIVE
HPV 16/18/45 genotyping: NEGATIVE
HPV: DETECTED — AB

## 2017-05-31 ENCOUNTER — Encounter: Payer: Self-pay | Admitting: Internal Medicine

## 2017-05-31 DIAGNOSIS — M25552 Pain in left hip: Principal | ICD-10-CM

## 2017-05-31 DIAGNOSIS — G8929 Other chronic pain: Secondary | ICD-10-CM

## 2017-06-02 ENCOUNTER — Ambulatory Visit
Admission: RE | Admit: 2017-06-02 | Discharge: 2017-06-02 | Disposition: A | Discharge status: Home / Self Care | Payer: Medicare Other | Source: Ambulatory Visit | Attending: Obstetrics & Gynecology | Admitting: Obstetrics & Gynecology

## 2017-06-02 DIAGNOSIS — Z13820 Encounter for screening for osteoporosis: Secondary | ICD-10-CM | POA: Insufficient documentation

## 2017-06-02 DIAGNOSIS — M8580 Other specified disorders of bone density and structure, unspecified site: Secondary | ICD-10-CM | POA: Insufficient documentation

## 2017-06-02 DIAGNOSIS — Z1231 Encounter for screening mammogram for malignant neoplasm of breast: Secondary | ICD-10-CM | POA: Insufficient documentation

## 2017-06-02 DIAGNOSIS — Z01419 Encounter for gynecological examination (general) (routine) without abnormal findings: Secondary | ICD-10-CM

## 2017-06-04 ENCOUNTER — Inpatient Hospital Stay
Admission: RE | Admit: 2017-06-04 | Discharge: 2017-06-04 | Disposition: A | Discharge status: Home / Self Care | Payer: Self-pay | Source: Ambulatory Visit | Attending: *Deleted | Admitting: *Deleted

## 2017-06-04 ENCOUNTER — Other Ambulatory Visit: Payer: Self-pay | Admitting: *Deleted

## 2017-06-04 DIAGNOSIS — Z9289 Personal history of other medical treatment: Secondary | ICD-10-CM

## 2017-06-07 ENCOUNTER — Ambulatory Visit: Payer: Medicare Other | Admitting: Podiatry

## 2017-06-07 ENCOUNTER — Encounter: Payer: Self-pay | Admitting: Podiatry

## 2017-06-07 DIAGNOSIS — B351 Tinea unguium: Secondary | ICD-10-CM | POA: Diagnosis not present

## 2017-06-07 DIAGNOSIS — M79676 Pain in unspecified toe(s): Secondary | ICD-10-CM | POA: Diagnosis not present

## 2017-06-07 DIAGNOSIS — M79674 Pain in right toe(s): Principal | ICD-10-CM

## 2017-06-07 NOTE — Progress Notes (Signed)
She presents today she is about to leave on a trip to [place] and is concerned that her hallux nail plate of her right foot is exquisitely painful already.  Objective: Vital signs are stable she is alert and oriented x3.  Pulses are palpable.  Her toenails are thick yellow dystrophic clinically mycotic and painful palpation as well as debridement.  Assessment: Pain in limb secondary to onychomycosis 1 through 5 bilateral onychodystrophy hallux right.  Plan: Discussed etiology pathology conservative versus surgical therapies.  Debrided toenails 1 through 5 bilateral cover service secondary to pain.

## 2017-06-30 ENCOUNTER — Other Ambulatory Visit: Payer: Medicare Other

## 2017-07-04 ENCOUNTER — Other Ambulatory Visit: Payer: Self-pay | Admitting: Internal Medicine

## 2017-07-19 ENCOUNTER — Ambulatory Visit: Payer: Medicare Other | Admitting: Internal Medicine

## 2017-07-29 ENCOUNTER — Other Ambulatory Visit (INDEPENDENT_AMBULATORY_CARE_PROVIDER_SITE_OTHER): Payer: Medicare Other

## 2017-07-29 DIAGNOSIS — Z1159 Encounter for screening for other viral diseases: Secondary | ICD-10-CM

## 2017-07-29 DIAGNOSIS — E119 Type 2 diabetes mellitus without complications: Secondary | ICD-10-CM | POA: Diagnosis not present

## 2017-07-29 DIAGNOSIS — E785 Hyperlipidemia, unspecified: Secondary | ICD-10-CM

## 2017-07-29 LAB — HEMOGLOBIN A1C: Hgb A1c MFr Bld: 8.1 % — ABNORMAL HIGH (ref 4.6–6.5)

## 2017-07-29 NOTE — Addendum Note (Signed)
Addended by: Leeanne [place] on: 07/29/2017 09:21 AM   Modules accepted: Orders

## 2017-07-30 LAB — COMPREHENSIVE METABOLIC PANEL
AG Ratio: 1.7 (calc) (ref 1.0–2.5)
ALT: 25 U/L (ref 6–29)
AST: 34 U/L (ref 10–35)
Albumin: 4.3 g/dL (ref 3.6–5.1)
Alkaline phosphatase (APISO): 120 U/L (ref 33–130)
BUN: 11 mg/dL (ref 7–25)
CO2: 25 mmol/L (ref 20–32)
Calcium: 9.2 mg/dL (ref 8.6–10.4)
Chloride: 101 mmol/L (ref 98–110)
Creat: 0.74 mg/dL (ref 0.60–0.93)
Globulin: 2.5 g/dL (calc) (ref 1.9–3.7)
Glucose, Bld: 156 mg/dL — ABNORMAL HIGH (ref 65–99)
Potassium: 4.3 mmol/L (ref 3.5–5.3)
Sodium: 137 mmol/L (ref 135–146)
Total Bilirubin: 0.6 mg/dL (ref 0.2–1.2)
Total Protein: 6.8 g/dL (ref 6.1–8.1)

## 2017-07-30 LAB — LIPID PANEL
Cholesterol: 146 mg/dL (ref <200)
HDL: 52 mg/dL (ref >50)
LDL Cholesterol (Calc): 77 mg/dL (calc)
Non-HDL Cholesterol (Calc): 94 mg/dL (calc) (ref <130)
Total CHOL/HDL Ratio: 2.8 (calc) (ref <5.0)
Triglycerides: 89 mg/dL (ref <150)

## 2017-07-30 LAB — LDL CHOLESTEROL, DIRECT: Direct LDL: 81 mg/dL (ref <100)

## 2017-07-30 LAB — HEPATITIS C ANTIBODY
Hepatitis C Ab: NONREACTIVE
SIGNAL TO CUT-OFF: 0.01 (ref <1.00)

## 2017-08-02 ENCOUNTER — Ambulatory Visit: Payer: Medicare Other | Admitting: Internal Medicine

## 2017-08-02 ENCOUNTER — Encounter: Payer: Self-pay | Admitting: Internal Medicine

## 2017-08-02 DIAGNOSIS — E119 Type 2 diabetes mellitus without complications: Secondary | ICD-10-CM

## 2017-08-02 DIAGNOSIS — E7800 Pure hypercholesterolemia, unspecified: Secondary | ICD-10-CM | POA: Diagnosis not present

## 2017-08-02 DIAGNOSIS — I10 Essential (primary) hypertension: Secondary | ICD-10-CM | POA: Diagnosis not present

## 2017-08-02 NOTE — Progress Notes (Signed)
Subjective:  Patient ID: [name], female    DOB: Jan 11, 1946  Age: 72 y.o. MRN: [id number]  CC: Diagnoses of Diabetes mellitus with no complication ([place]), Pure hypercholesterolemia, and Essential hypertension were pertinent to this visit.  HPI [name] presents for 3 month follow up on diabetes.  Patient has no complaints today.  Patient is following a  Mediterranean diet and taking all prescribed medications regularly without side effects.  Fasting sugars have been under less than 140 most of the time and post prandials have been under 160 except on rare occasions. Patient is exercising about 3 times per week and intentionally trying to lose weight .  Patient has had an eye exam in the last 12 months and checks feet regularly for signs of infection.  Patient does not walk barefoot outside,  And denies any numbness tingling or burning in feet. Patient is up to date on all recommended vaccinations  Taking glipizide 5 mg bid  Uses  less if morning aerobics class is strenuous .    Sugars elevated due to recent trip to the [facility] with the  [employer]. Developed a skin infection on buttocks from [place]  Treated it with topical antibiotic with vaseline  Now resolved   Hyperplastic polyps  By 2015 colonocopy  Lab Results  Component Value Date   HGBA1C 8.1 (H) 07/29/2017     Outpatient Medications Prior to Visit  Medication Sig Dispense Refill  . ACCU-CHEK AVIVA PLUS test strip TEST CBG THREE TIMES DAILY, E11.65 100 each 2  . aspirin 81 MG tablet Take 1 tablet (81 mg total) by mouth daily. 30 tablet 2  . atorvastatin (LIPITOR) 80 MG tablet TAKE 1 TABLET (80 MG TOTAL) BY MOUTH DAILY. 90 tablet 1  . B-D INS SYR ULTRAFINE 1CC/31G 31G X 5/16" 1 ML MISC USE TO INJECT INSULIN TWICE DAILY 100 each 3  . blood glucose meter kit and supplies KIT Dispense based on patient and insurance preference. Use up to four times daily as directed. (FOR ICD-9 250.00,  250.01). 1 each 0  . calcium-vitamin D (OSCAL WITH D) 250-125 MG-UNIT per tablet Take 2 tablets by mouth daily.    . fish oil-omega-3 fatty acids 1000 MG capsule Take 2 g by mouth 2 (two) times daily.      Marland Kitchen glipiZIDE (GLUCOTROL) 5 MG tablet TAKE 1 TABLET (5 MG TOTAL) BY MOUTH 2 (TWO) TIMES DAILY BEFORE A MEAL. 180 tablet 1  . losartan (COZAAR) 50 MG tablet TAKE 1 TABLET (50 MG TOTAL) BY MOUTH DAILY. 90 tablet 1  . Methotrexate, Anti-Rheumatic, (METHOTREXATE, PF, [place]) Inject 0.7 mLs into the skin once a week.     . Multiple Vitamin (MULTIVITAMIN) tablet Take 1 tablet by mouth daily.    . pantoprazole (PROTONIX) 40 MG tablet TAKE 1 TABLET BY MOUTH EVERY DAY 90 tablet 1  . Psyllium (METAMUCIL FIBER PO) Take by mouth.    Marland Kitchen tiZANidine (ZANAFLEX) 4 MG tablet TAKE 1 TABLET (4 MG TOTAL) BY MOUTH 2 (TWO) TIMES DAILY AS NEEDED  2  . traMADol (ULTRAM) 50 MG tablet Take 1 tablet (50 mg total) by mouth every 8 (eight) hours as needed. 30 tablet 0  . vitamin C (ASCORBIC ACID) 500 MG tablet Take 500 mg by mouth daily.    . vitamin E 100 UNIT capsule Take 100 Units by mouth daily.    . bisacodyl (DULCOLAX) 5 MG EC tablet Take 5 mg by  mouth daily as needed for moderate constipation.    . famotidine (PEPCID) 40 MG tablet TAKE 1 TABLET (40 MG TOTAL) BY MOUTH DAILY. (Patient not taking: Reported on 08/02/2017) 90 tablet 1   No facility-administered medications prior to visit.     Review of Systems;  Patient denies headache, fevers, malaise, unintentional weight loss, skin rash, eye pain, sinus congestion and sinus pain, sore throat, dysphagia,  hemoptysis , cough, dyspnea, wheezing, chest pain, palpitations, orthopnea, edema, abdominal pain, nausea, melena, diarrhea, constipation, flank pain, dysuria, hematuria, urinary  Frequency, nocturia, numbness, tingling, seizures,  Focal weakness, Loss of consciousness,  Tremor, insomnia, depression, anxiety, and suicidal ideation.      Objective:  BP 130/74 (BP  Location: Left Arm, Patient Position: Sitting, Cuff Size: Normal)   Pulse 83   Temp (!) 97.5 F (36.4 C) (Oral)   Resp 14   Ht 5' 6"  (1.676 m)   Wt 178 lb 9.6 oz (81 kg)   SpO2 97%   BMI 28.83 kg/m   BP Readings from Last 3 Encounters:  08/02/17 130/74  04/20/17 (!) 153/93  04/13/17 130/70    Wt Readings from Last 3 Encounters:  08/02/17 178 lb 9.6 oz (81 kg)  04/20/17 187 lb 9.6 oz (85.1 kg)  04/13/17 187 lb 12.8 oz (85.2 kg)    General appearance: alert, cooperative and appears stated age Ears: normal TM's and external ear canals both ears Throat: lips, mucosa, and tongue normal; teeth and gums normal Neck: no adenopathy, no carotid bruit, supple, symmetrical, trachea midline and thyroid not enlarged, symmetric, no tenderness/mass/nodules Back: symmetric, no curvature. ROM normal. No CVA tenderness. Lungs: clear to auscultation bilaterally Heart: regular rate and rhythm, S1, S2 normal, no murmur, click, rub or gallop Abdomen: soft, non-tender; bowel sounds normal; no masses,  no organomegaly Pulses: 2+ and symmetric Skin: Skin color, texture, turgor normal. No rashes or lesions Lymph nodes: Cervical, supraclavicular, and axillary nodes normal.  Lab Results  Component Value Date   HGBA1C 8.1 (H) 07/29/2017   HGBA1C 7.2 (H) 04/05/2017   HGBA1C 7.2 (H) 12/24/2016    Lab Results  Component Value Date   CREATININE 0.74 07/29/2017   CREATININE 0.75 04/05/2017   CREATININE 0.79 12/24/2016    Lab Results  Component Value Date   WBC 4.0 12/24/2016   HGB 14.0 12/24/2016   HCT 42.2 12/24/2016   PLT 225.0 12/24/2016   GLUCOSE 156 (H) 07/29/2017   CHOL 146 07/29/2017   TRIG 89 07/29/2017   HDL 52 07/29/2017   LDLDIRECT 81 07/29/2017   LDLCALC 77 07/29/2017   ALT 25 07/29/2017   AST 34 07/29/2017   NA 137 07/29/2017   K 4.3 07/29/2017   CL 101 07/29/2017   CREATININE 0.74 07/29/2017   BUN 11 07/29/2017   CO2 25 07/29/2017   TSH 2.34 12/24/2016   HGBA1C 8.1  (H) 07/29/2017   MICROALBUR <0.7 12/30/2015    Mm Outside Films Mammo  Result Date: 06/04/2017 This examination belongs to an outside facility and is stored here for comparison purposes only.  Contact the originating outside institution for any associated report or interpretation.   Assessment & Plan:   Problem List Items Addressed This Visit    Hypertension    Well controlled on current regimen. Renal function stable, no changes today.  Lab Results  Component Value Date   CREATININE 0.74 07/29/2017   Lab Results  Component Value Date   NA 137 07/29/2017   K 4.3 07/29/2017  CL 101 07/29/2017   CO2 25 07/29/2017         Hyperlipidemia    LDL and triglycerides are at goal on current medications. sHe has no side effects and liver enzymes are normal. No changes today.  Lab Results  Component Value Date   CHOL 146 07/29/2017   HDL 52 07/29/2017   LDLCALC 77 07/29/2017   LDLDIRECT 81 07/29/2017   TRIG 89 07/29/2017   CHOLHDL 2.8 07/29/2017   Lab Results  Component Value Date   ALT 25 07/29/2017   AST 34 07/29/2017   ALKPHOS 99 04/05/2017   BILITOT 0.6 07/29/2017              Diabetes mellitus with no complication (HCC)    Recent elevation due to dietary changes while touring [place].  Since she has returned and resumed her diet her blood sugars have improved.  No changes today until the cbgs for the next 2 weeks can be reviewed          A total of 25 minutes of face to face time was spent with patient more than half of which was spent in counselling about the above mentioned conditions  and coordination of care   I have discontinued Edit Ricciardelli. [place]'s bisacodyl and famotidine. I am also having her maintain her fish oil-omega-3 fatty acids, vitamin C, vitamin E, multivitamin, aspirin, calcium-vitamin D, blood glucose meter kit and supplies, (Methotrexate, Anti-Rheumatic, (METHOTREXATE, PF, [place])), B-D INS SYR ULTRAFINE 1CC/31G, glipiZIDE, atorvastatin, traMADol,  ACCU-CHEK AVIVA PLUS, losartan, pantoprazole, tiZANidine, and Psyllium (METAMUCIL FIBER PO).  No orders of the defined types were placed in this encounter.   Medications Discontinued During This Encounter  Medication Reason  . bisacodyl (DULCOLAX) 5 MG EC tablet Patient has not taken in last 30 days  . famotidine (PEPCID) 40 MG tablet Change in therapy    Follow-up: Return in about 3 months (around 11/02/2017) for follow up diabetes fasting labs prior .   Crecencio Mc, MD

## 2017-08-02 NOTE — Patient Instructions (Addendum)
Your a1c reflects your naughtiness!!  I'm glad you had a great trip!  We are making no medication changes until we see how the next 2 weeks of blood sugars look

## 2017-08-03 NOTE — Assessment & Plan Note (Signed)
Recent elevation due to dietary changes while touring [place].  Since she has returned and resumed her diet her blood sugars have improved.  No changes today until the cbgs for the next 2 weeks can be reviewed

## 2017-08-03 NOTE — Assessment & Plan Note (Signed)
LDL and triglycerides are at goal on current medications. sHe has no side effects and liver enzymes are normal. No changes today.  Lab Results  Component Value Date   CHOL 146 07/29/2017   HDL 52 07/29/2017   LDLCALC 77 07/29/2017   LDLDIRECT 81 07/29/2017   TRIG 89 07/29/2017   CHOLHDL 2.8 07/29/2017   Lab Results  Component Value Date   ALT 25 07/29/2017   AST 34 07/29/2017   ALKPHOS 99 04/05/2017   BILITOT 0.6 07/29/2017

## 2017-08-03 NOTE — Assessment & Plan Note (Signed)
Well controlled on current regimen. Renal function stable, no changes today.  Lab Results  Component Value Date   CREATININE 0.74 07/29/2017   Lab Results  Component Value Date   NA 137 07/29/2017   K 4.3 07/29/2017   CL 101 07/29/2017   CO2 25 07/29/2017

## 2017-08-16 ENCOUNTER — Other Ambulatory Visit: Payer: Self-pay | Admitting: Internal Medicine

## 2017-09-07 ENCOUNTER — Telehealth: Payer: Self-pay | Admitting: Internal Medicine

## 2017-09-07 DIAGNOSIS — E119 Type 2 diabetes mellitus without complications: Secondary | ICD-10-CM

## 2017-09-07 NOTE — Telephone Encounter (Signed)
Placed in yellow folder.  

## 2017-09-07 NOTE — Telephone Encounter (Signed)
Pt dropped off blood sugar readings. Readings are up front in Dr. Lupita Dawn colored folder.

## 2017-09-08 IMAGING — CR DG LUMBAR SPINE COMPLETE 4+V
5 series · 5 of 5 positions shown · non-contrast
Comparison: None.

CLINICAL DATA: Spinal stenosis, lumbar region, with neurogenic
claudication. Patient has had a spinal fusion from L2 through the
sacrum. Bilateral sciatica for 6 months. No known injury.

EXAM:
LUMBAR SPINE - COMPLETE 4+ VIEW

[l-spine ap]
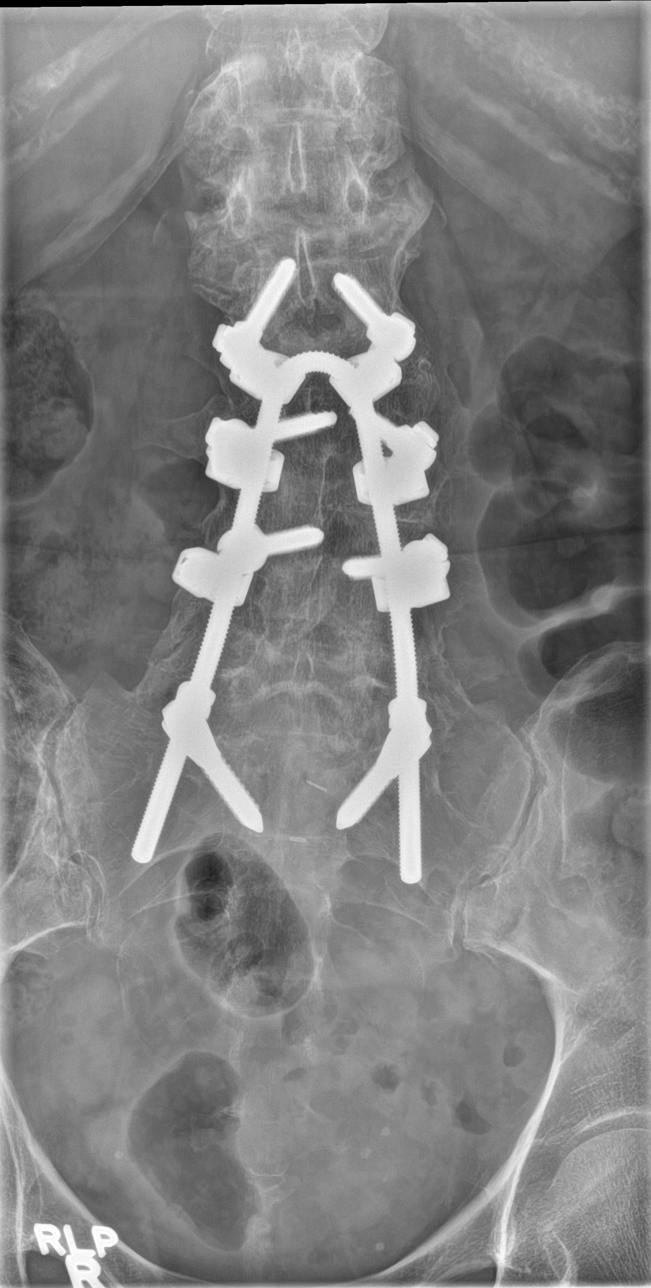

[l-spine obl (1 of 2)]
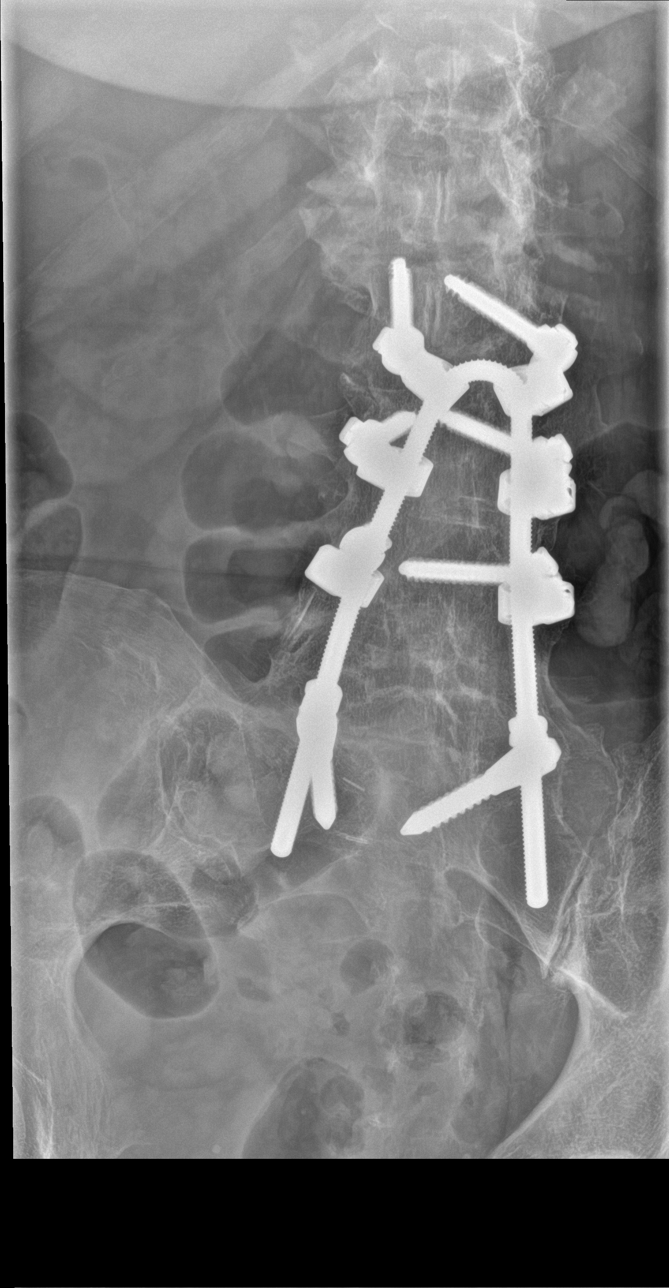

[l-spine obl (2 of 2)]
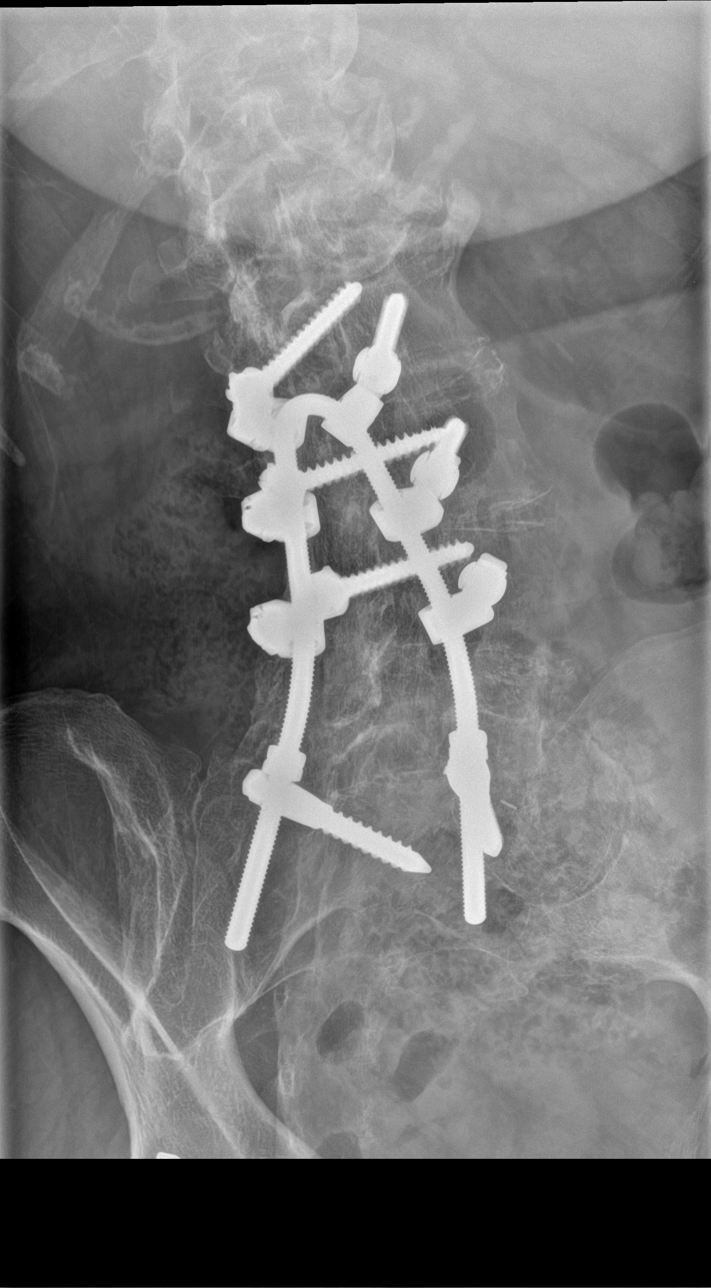

[l-spine lat]
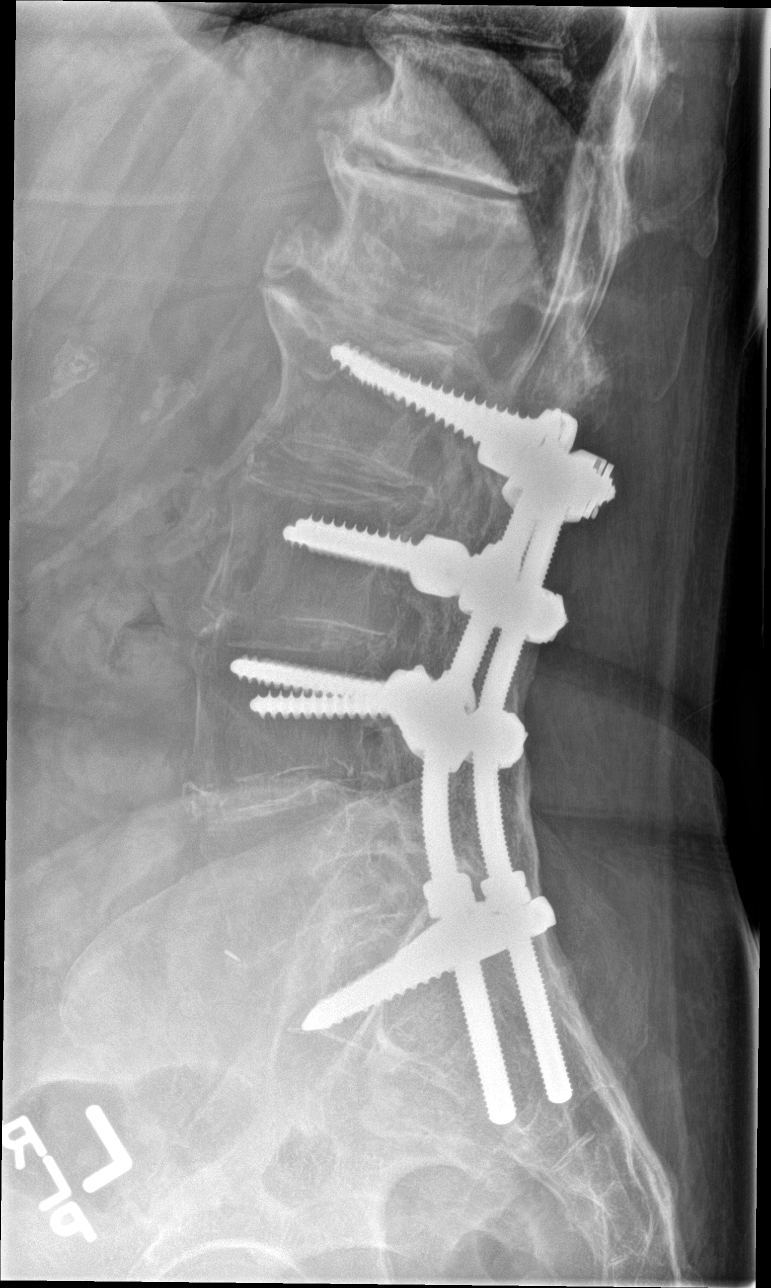

[l-spine spot]
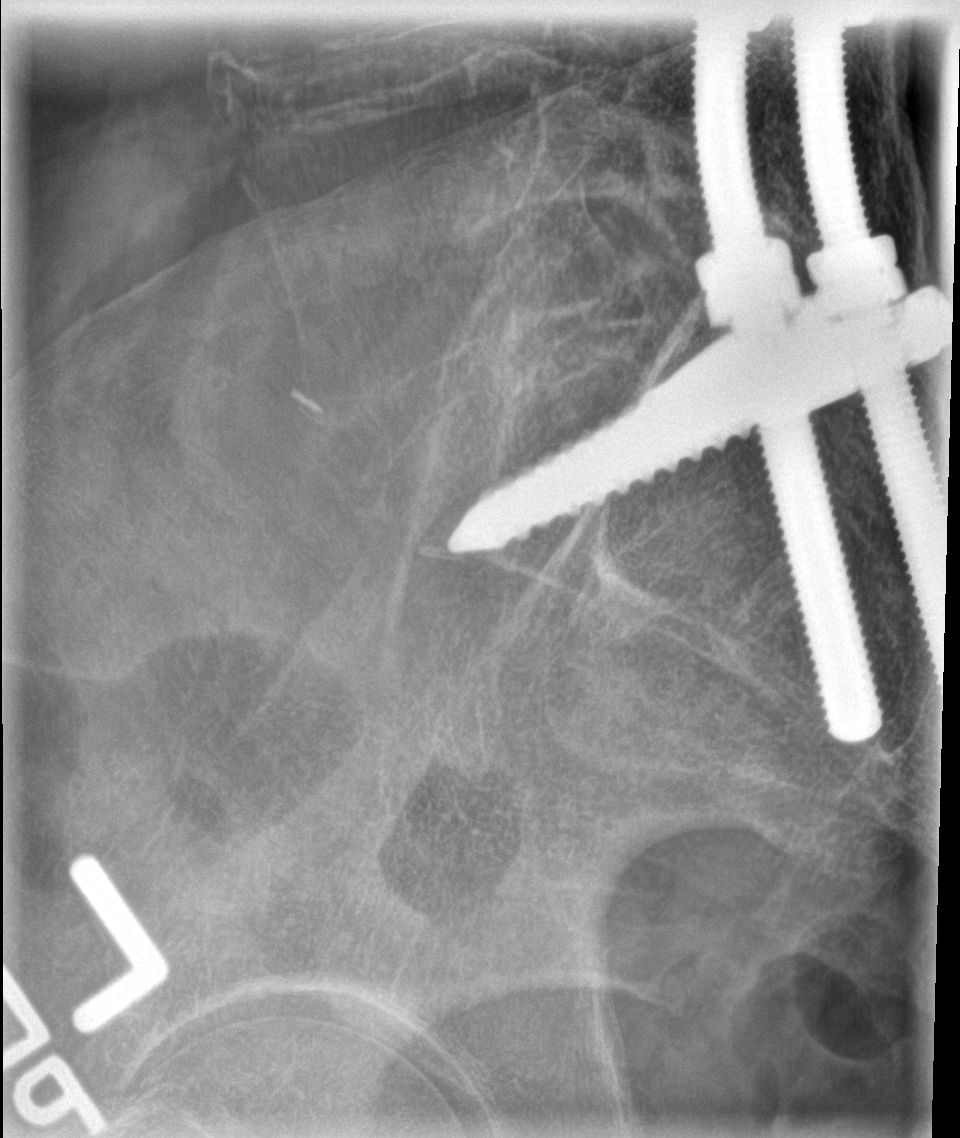

[5 of 5 positions shown; findings below may reference images not displayed]

FINDINGS: There are bilateral pedicle screws at L2, L3, L4 and S1.
Interconnecting rods connect the pedicle screws. The orthopedic
hardware is well-seated and aligned with no evidence of loosening.

There is no fracture.  There is no spondylolisthesis.

Moderate loss of disc height is noted throughout the visualized
spine.

There is mature bone graft material along the lateral posterior
aspect of the mid to lower lumbar spine.

The bones are diffusely demineralized.

Soft tissues are unremarkable.
IMPRESSION: 1. No fracture or acute finding.
2. Orthopedic hardware is well-seated with no evidence of loosening.
3. Disc degenerative changes and diffuse bony demineralization.

## 2017-09-13 MED ORDER — SITAGLIPTIN PHOSPHATE 50 MG PO TABS: 50.0000 mg | ORAL_TABLET | Freq: Every day | ORAL | 5 refills | Status: DC

## 2017-09-13 NOTE — Telephone Encounter (Signed)
Sugars reviewed  evening sugars still elevated, but not always , so continue 5 mg glipizide  Twice daily and add  New medication called januvia  50 mg Once daily   Januvia is a DPP4  inhibitor which causes prolonged activation of a hormone called incretin.  This hormone stimulates the pancreas to produce more insulin and prevents the liver from creating  more glucose,  so the end result is lower blood sugars .  If she would like to wait until her next office visit,  I would recommend repeating her A1c before her next visit (has to be after  June 14th for insurance to Pay)  Lab Results  Component Value Date   HGBA1C 8.1 (H) 07/29/2017

## 2017-09-16 ENCOUNTER — Telehealth: Payer: Self-pay | Admitting: Internal Medicine

## 2017-09-16 NOTE — Telephone Encounter (Signed)
fyi

## 2017-09-16 NOTE — Telephone Encounter (Signed)
Pt dropped off Blood Glucose  Readings for Dr. Derrel Nip. Readings are up front in color folder.

## 2017-09-16 NOTE — Telephone Encounter (Signed)
Placed in results folder.  

## 2017-09-17 ENCOUNTER — Telehealth: Payer: Self-pay | Admitting: Internal Medicine

## 2017-09-17 NOTE — Telephone Encounter (Signed)
Pt dropped off [place] [employer] form to be filled out  Placed in [place] color folder upfront  Please advise pt when ready

## 2017-09-17 NOTE — Telephone Encounter (Signed)
Spoke with pt and informed her that according to her blood sugar readings that she brought in and she does not need to take the [place]. The pt gave a verbal understanding.

## 2017-09-17 NOTE — Telephone Encounter (Signed)
Placed in red folder  

## 2017-09-17 NOTE — Telephone Encounter (Signed)
Your recent sugars are much better.  You do not need to start the Januvia.

## 2017-10-17 ENCOUNTER — Other Ambulatory Visit: Payer: Self-pay | Admitting: Internal Medicine

## 2017-11-02 ENCOUNTER — Telehealth: Payer: Self-pay | Admitting: Radiology

## 2017-11-02 DIAGNOSIS — E119 Type 2 diabetes mellitus without complications: Secondary | ICD-10-CM

## 2017-11-02 NOTE — Telephone Encounter (Signed)
PLEASE LET HER KNOW WE NEED A URINE SPECIMEN TOO  Lab Results  Component Value Date   MICROALBUR <0.7 12/30/2015

## 2017-11-02 NOTE — Telephone Encounter (Signed)
Pt coming in for labs tomorrow, please place future orders. Thank you.  

## 2017-11-03 ENCOUNTER — Other Ambulatory Visit (INDEPENDENT_AMBULATORY_CARE_PROVIDER_SITE_OTHER): Payer: Medicare Other

## 2017-11-03 DIAGNOSIS — E119 Type 2 diabetes mellitus without complications: Secondary | ICD-10-CM | POA: Diagnosis not present

## 2017-11-03 LAB — COMPREHENSIVE METABOLIC PANEL
ALT: 19 U/L (ref 0–35)
AST: 29 U/L (ref 0–37)
Albumin: 4.1 g/dL (ref 3.5–5.2)
Alkaline Phosphatase: 86 U/L (ref 39–117)
BUN: 13 mg/dL (ref 6–23)
CO2: 29 mEq/L (ref 19–32)
Calcium: 9.3 mg/dL (ref 8.4–10.5)
Chloride: 103 mEq/L (ref 96–112)
Creatinine, Ser: 0.73 mg/dL (ref 0.40–1.20)
GFR: 83.61 mL/min (ref >60.00)
Glucose, Bld: 134 mg/dL — ABNORMAL HIGH (ref 70–99)
Potassium: 4.3 mEq/L (ref 3.5–5.1)
Sodium: 138 mEq/L (ref 135–145)
Total Bilirubin: 0.8 mg/dL (ref 0.2–1.2)
Total Protein: 6.9 g/dL (ref 6.0–8.3)

## 2017-11-03 LAB — LIPID PANEL
Cholesterol: 131 mg/dL (ref 0–200)
HDL: 56.9 mg/dL (ref >39.00)
LDL Cholesterol: 57 mg/dL (ref 0–99)
NonHDL: 74.45
Total CHOL/HDL Ratio: 2
Triglycerides: 87 mg/dL (ref 0.0–149.0)
VLDL: 17.4 mg/dL (ref 0.0–40.0)

## 2017-11-03 LAB — HEMOGLOBIN A1C: Hgb A1c MFr Bld: 7 % — ABNORMAL HIGH (ref 4.6–6.5)

## 2017-11-03 LAB — MICROALBUMIN / CREATININE URINE RATIO
Creatinine,U: 51.2 mg/dL
Microalb Creat Ratio: 1.4 mg/g (ref 0.0–30.0)
Microalb, Ur: <0.7 mg/dL (ref 0.0–1.9)

## 2017-11-05 ENCOUNTER — Ambulatory Visit: Payer: Medicare Other | Admitting: Internal Medicine

## 2017-11-05 ENCOUNTER — Encounter: Payer: Self-pay | Admitting: Internal Medicine

## 2017-11-05 VITALS — BP 124/74 | HR 67 | Temp 97.6°F | Resp 15 | Ht 66.0 in | Wt 176.8 lb

## 2017-11-05 DIAGNOSIS — E119 Type 2 diabetes mellitus without complications: Secondary | ICD-10-CM | POA: Diagnosis not present

## 2017-11-05 DIAGNOSIS — E782 Mixed hyperlipidemia: Secondary | ICD-10-CM

## 2017-11-05 DIAGNOSIS — Z79899 Other long term (current) drug therapy: Secondary | ICD-10-CM | POA: Diagnosis not present

## 2017-11-05 DIAGNOSIS — K760 Fatty (change of) liver, not elsewhere classified: Secondary | ICD-10-CM

## 2017-11-05 NOTE — Progress Notes (Signed)
Subjective:  Patient ID: [name], female    DOB: December 16, 1945  Age: 72 y.o. MRN: [id number]  CC: The primary encounter diagnosis was Long-term use of high-risk medication. Diagnoses of Mixed hyperlipidemia, Diabetes mellitus with no complication ([place]), and Fatty liver disease, nonalcoholic were also pertinent to this visit.  HPI [name] presents for 3 month follow up on diabetes.  Patient has no complaints today.  Patient is following a low glycemic index diet and taking all prescribed medications regularly without side effects.  Fasting sugars have been under less than 140 most of the time and post prandials have been under 160 except on rare occasions. Patient is exercising about 3 times per week and intentionally trying to lose weight . Has lost 11 lb since Nov  Patient has had an eye exam in the last 12 months and checks feet regularly for signs of infection.  Patient does not walk barefoot outside,  And denies an numbness tingling or burning in feet. Patient is up to date on all recommended vaccinations  Marked improvement  in A1c despite eating ice cream nightly for a week in May  abd bloating,  Constipation  intermittent . Using stool softener and metamucil in the morning  Taking 3 mg folic acid  Daily except on MTX days   Has annual PAPS by GYN Cc:  pain over 5th MT head with callous noted.  Wears orthotics prescribed by [facility] Results  Component Value Date   HGBA1C 7.0 (H) 11/03/2017     Outpatient Medications Prior to Visit  Medication Sig Dispense Refill  . ACCU-CHEK AVIVA PLUS test strip TEST CBG THREE TIMES DAILY, E11.65 100 each 2  . aspirin 81 MG tablet Take 1 tablet (81 mg total) by mouth daily. 30 tablet 2  . atorvastatin (LIPITOR) 80 MG tablet TAKE 1 TABLET (80 MG TOTAL) BY MOUTH DAILY. 90 tablet 1  . B-D INS SYR ULTRAFINE 1CC/31G 31G X 5/16" 1 ML MISC USE TO INJECT INSULIN TWICE DAILY 100 each 3  . blood glucose meter kit and supplies KIT Dispense  based on patient and insurance preference. Use up to four times daily as directed. (FOR ICD-9 250.00, 250.01). 1 each 0  . calcium-vitamin D (OSCAL WITH D) 250-125 MG-UNIT per tablet Take 2 tablets by mouth daily.    . fish oil-omega-3 fatty acids 1000 MG capsule Take 2 g by mouth 2 (two) times daily.      . folic acid (FOLVITE) 0.5 MG tablet Take 0.5 mg by mouth daily.    Marland Kitchen glipiZIDE (GLUCOTROL) 5 MG tablet TAKE 1 TABLET (5 MG TOTAL) BY MOUTH 2 (TWO) TIMES DAILY BEFORE A MEAL. 180 tablet 1  . losartan (COZAAR) 50 MG tablet TAKE 1 TABLET (50 MG TOTAL) BY MOUTH DAILY. 90 tablet 1  . Methotrexate, Anti-Rheumatic, (METHOTREXATE, PF, [place]) Inject 0.7 mLs into the skin once a week.     . Multiple Vitamin (MULTIVITAMIN) tablet Take 1 tablet by mouth daily.    . pantoprazole (PROTONIX) 40 MG tablet TAKE 1 TABLET BY MOUTH EVERY DAY 90 tablet 1  . Psyllium (METAMUCIL FIBER PO) Take by mouth.    Marland Kitchen tiZANidine (ZANAFLEX) 4 MG tablet TAKE 1 TABLET (4 MG TOTAL) BY MOUTH 2 (TWO) TIMES DAILY AS NEEDED  2  . traMADol (ULTRAM) 50 MG tablet Take 1 tablet (50 mg total) by mouth every 8 (eight) hours as needed. 30 tablet 0  . vitamin C (ASCORBIC ACID)  500 MG tablet Take 500 mg by mouth daily.    . vitamin E 100 UNIT capsule Take 100 Units by mouth daily.    . sitaGLIPtin (JANUVIA) 50 MG tablet Take 1 tablet (50 mg total) by mouth daily. (Patient not taking: Reported on 11/05/2017) 30 tablet 5   No facility-administered medications prior to visit.     Review of Systems;  Patient denies headache, fevers, malaise, unintentional weight loss, skin rash, eye pain, sinus congestion and sinus pain, sore throat, dysphagia,  hemoptysis , cough, dyspnea, wheezing, chest pain, palpitations, orthopnea, edema, abdominal pain, nausea, melena, diarrhea, constipation, flank pain, dysuria, hematuria, urinary  Frequency, nocturia, numbness, tingling, seizures,  Focal weakness, Loss of consciousness,  Tremor, insomnia, depression,  anxiety, and suicidal ideation.      Objective:  BP 124/74 (BP Location: Left Arm, Patient Position: Sitting, Cuff Size: Normal)   Pulse 67   Temp 97.6 F (36.4 C) (Oral)   Resp 15   Ht 5' 6"  (1.676 m)   Wt 176 lb 12.8 oz (80.2 kg)   SpO2 98%   BMI 28.54 kg/m   BP Readings from Last 3 Encounters:  11/05/17 124/74  08/02/17 130/74  04/20/17 (!) 153/93    Wt Readings from Last 3 Encounters:  11/05/17 176 lb 12.8 oz (80.2 kg)  08/02/17 178 lb 9.6 oz (81 kg)  04/20/17 187 lb 9.6 oz (85.1 kg)    General appearance: alert, cooperative and appears stated age Ears: normal TM's and external ear canals both ears Throat: lips, mucosa, and tongue normal; teeth and gums normal Neck: no adenopathy, no carotid bruit, supple, symmetrical, trachea midline and thyroid not enlarged, symmetric, no tenderness/mass/nodules Back: symmetric, no curvature. ROM normal. No CVA tenderness. Lungs: clear to auscultation bilaterally Heart: regular rate and rhythm, S1, S2 normal, no murmur, click, rub or gallop Abdomen: soft, non-tender; bowel sounds normal; no masses,  no organomegaly Pulses: 2+ and symmetric Skin: Skin color, texture, turgor normal. No rashes or lesions Lymph nodes: Cervical, supraclavicular, and axillary nodes normal.  Lab Results  Component Value Date   HGBA1C 7.0 (H) 11/03/2017   HGBA1C 8.1 (H) 07/29/2017   HGBA1C 7.2 (H) 04/05/2017    Lab Results  Component Value Date   CREATININE 0.73 11/03/2017   CREATININE 0.74 07/29/2017   CREATININE 0.75 04/05/2017    Lab Results  Component Value Date   WBC 4.0 12/24/2016   HGB 14.0 12/24/2016   HCT 42.2 12/24/2016   PLT 225.0 12/24/2016   GLUCOSE 134 (H) 11/03/2017   CHOL 131 11/03/2017   TRIG 87.0 11/03/2017   HDL 56.90 11/03/2017   LDLDIRECT 81 07/29/2017   LDLCALC 57 11/03/2017   ALT 19 11/03/2017   AST 29 11/03/2017   NA 138 11/03/2017   K 4.3 11/03/2017   CL 103 11/03/2017   CREATININE 0.73 11/03/2017   BUN 13  11/03/2017   CO2 29 11/03/2017   TSH 2.34 12/24/2016   HGBA1C 7.0 (H) 11/03/2017   MICROALBUR <0.7 11/03/2017    Mm Outside Films Mammo  Result Date: 06/04/2017 This examination belongs to an outside facility and is stored here for comparison purposes only.  Contact the originating outside institution for any associated report or interpretation.   Assessment & Plan:   Problem List Items Addressed This Visit    Long-term use of high-risk medication - Primary   Relevant Orders   RBC Folate   Hyperlipidemia    LDL and triglycerides are at goal on current medications. sHe  has no side effects and liver enzymes are normal. No changes today.  Lab Results  Component Value Date   CHOL 131 11/03/2017   HDL 56.90 11/03/2017   LDLCALC 57 11/03/2017   LDLDIRECT 81 07/29/2017   TRIG 87.0 11/03/2017   CHOLHDL 2 11/03/2017   Lab Results  Component Value Date   ALT 19 11/03/2017   AST 29 11/03/2017   ALKPHOS 86 11/03/2017   BILITOT 0.8 11/03/2017              Relevant Orders   Lipid panel   Fatty liver disease, nonalcoholic    Secondary to obesity and diabetes/metabolic syndrome. I have addressed  BMI and recommended a low glycemic index diet utilizing smaller more frequent meals to increase metabolism.Her participation in regular  Exercise is ongoing  But limited by left shoulder pain .  Lab Results  Component Value Date   ALT 19 11/03/2017   AST 29 11/03/2017   ALKPHOS 86 11/03/2017   BILITOT 0.8 11/03/2017   Repeat  revaccination for Hep a and B complete       Diabetes mellitus with no complication (HCC)    Improving control with low GI diet and exercise .  hemoglobin A1c is at goal.. Patient is rup to date on  eye exam and foot exam was done today.  There is  no proteinuria on prior micro urinalysis .  Continue asa and atorvastatin.   Lab Results  Component Value Date   HGBA1C 7.0 (H) 11/03/2017  ' Lab Results  Component Value Date   MICROALBUR <0.7 11/03/2017           Relevant Orders   Hemoglobin A1c   Comprehensive metabolic panel      I have discontinued Niesha Bame. Tashiro's sitaGLIPtin. I am also having her maintain her fish oil-omega-3 fatty acids, vitamin C, vitamin E, multivitamin, aspirin, calcium-vitamin D, blood glucose meter kit and supplies, (Methotrexate, Anti-Rheumatic, (METHOTREXATE, PF, [place])), B-D INS SYR ULTRAFINE 1CC/31G, atorvastatin, traMADol, ACCU-CHEK AVIVA PLUS, pantoprazole, tiZANidine, Psyllium (METAMUCIL FIBER PO), glipiZIDE, losartan, and folic acid.  No orders of the defined types were placed in this encounter.   Medications Discontinued During This Encounter  Medication Reason  . sitaGLIPtin (JANUVIA) 50 MG tablet Patient has not taken in last 30 days    Follow-up: Return in about 6 months (around 05/07/2018) for follow up diabetes.   Crecencio Mc, MD

## 2017-11-05 NOTE — Patient Instructions (Addendum)
Your throat clearing may be due to post nasal drip baSED ON YOUR EXAM .  I recommend trying one the following daily :     Allegra is available generically as fexofenadine and it comes in 60 mg and 180 mg once daily strengths.  The claritin is also available generically as loratidine .   Take one daily in the mornings or at dinner    Suspend the folic acid on the Methotrexate day.  Take 3 daily  The other 6 days per week    Try taking your Colace and metamucil at bedtime it a full glass of water   Change your Lipitor to  after dinner so it gets absorbed better  Your diabetes is now  under excellent control currently. .Please return in 6 months for follow up on diabetes and make sure you are seeing your eye doctor at least once a year.

## 2017-11-07 NOTE — Assessment & Plan Note (Signed)
Secondary to obesity and diabetes/metabolic syndrome. I have addressed  BMI and recommended a low glycemic index diet utilizing smaller more frequent meals to increase metabolism.Her participation in regular  Exercise is ongoing  But limited by left shoulder pain .  Lab Results  Component Value Date   ALT 19 11/03/2017   AST 29 11/03/2017   ALKPHOS 86 11/03/2017   BILITOT 0.8 11/03/2017   Repeat  revaccination for Hep a and B complete

## 2017-11-07 NOTE — Assessment & Plan Note (Signed)
LDL and triglycerides are at goal on current medications. sHe has no side effects and liver enzymes are normal. No changes today.  Lab Results  Component Value Date   CHOL 131 11/03/2017   HDL 56.90 11/03/2017   LDLCALC 57 11/03/2017   LDLDIRECT 81 07/29/2017   TRIG 87.0 11/03/2017   CHOLHDL 2 11/03/2017   Lab Results  Component Value Date   ALT 19 11/03/2017   AST 29 11/03/2017   ALKPHOS 86 11/03/2017   BILITOT 0.8 11/03/2017

## 2017-11-07 NOTE — Assessment & Plan Note (Signed)
Improving control with low GI diet and exercise .  hemoglobin A1c is at goal.. Patient is rup to date on  eye exam and foot exam was done today.  There is  no proteinuria on prior micro urinalysis .  Continue asa and atorvastatin.   Lab Results  Component Value Date   HGBA1C 7.0 (H) 11/03/2017  ' Lab Results  Component Value Date   MICROALBUR <0.7 11/03/2017

## 2017-11-10 ENCOUNTER — Encounter: Payer: Self-pay | Admitting: Podiatry

## 2017-11-10 ENCOUNTER — Ambulatory Visit: Payer: Medicare Other | Admitting: Podiatry

## 2017-11-10 DIAGNOSIS — E119 Type 2 diabetes mellitus without complications: Secondary | ICD-10-CM | POA: Diagnosis not present

## 2017-11-10 DIAGNOSIS — B351 Tinea unguium: Secondary | ICD-10-CM | POA: Diagnosis not present

## 2017-11-10 DIAGNOSIS — Q828 Other specified congenital malformations of skin: Secondary | ICD-10-CM

## 2017-11-10 DIAGNOSIS — M79674 Pain in right toe(s): Secondary | ICD-10-CM

## 2017-11-10 NOTE — Progress Notes (Signed)
She presents today chief complaint of painful calluses and corns as well as painful toenails.  Objective: Vital signs are stable she is alert and oriented x3.  Pulses are palpable.  Toenails are long thick yellow dystrophic with mycotic reactive hyper keratomas bilaterally.  Assessment: Pain in limb secondary to onychomycosis and porokeratosis.  Plan: Debridement of toenails 1 through 5 bilateral and also debrided reactive hyper keratomas.  She is going to have her old orthotics recovered because the new ones are too expensive.

## 2017-11-30 DIAGNOSIS — E119 Type 2 diabetes mellitus without complications: Secondary | ICD-10-CM

## 2017-12-23 ENCOUNTER — Other Ambulatory Visit: Payer: Self-pay | Admitting: Internal Medicine

## 2017-12-24 ENCOUNTER — Other Ambulatory Visit: Payer: Self-pay

## 2017-12-24 MED ORDER — GLUCOSE BLOOD VI STRP: ORAL_STRIP | 2 refills | Status: DC

## 2018-01-05 ENCOUNTER — Other Ambulatory Visit: Payer: Self-pay | Admitting: Internal Medicine

## 2018-01-10 LAB — HM DIABETES EYE EXAM

## 2018-01-11 ENCOUNTER — Other Ambulatory Visit: Payer: Self-pay | Admitting: Internal Medicine

## 2018-02-07 ENCOUNTER — Encounter: Payer: Self-pay | Admitting: Internal Medicine

## 2018-02-09 ENCOUNTER — Encounter: Payer: Self-pay | Admitting: Internal Medicine

## 2018-02-09 ENCOUNTER — Ambulatory Visit: Payer: Medicare Other | Admitting: Internal Medicine

## 2018-02-09 DIAGNOSIS — B9789 Other viral agents as the cause of diseases classified elsewhere: Secondary | ICD-10-CM | POA: Diagnosis not present

## 2018-02-09 DIAGNOSIS — Z79899 Other long term (current) drug therapy: Secondary | ICD-10-CM | POA: Diagnosis not present

## 2018-02-09 DIAGNOSIS — J069 Acute upper respiratory infection, unspecified: Secondary | ICD-10-CM

## 2018-02-09 MED ORDER — PREDNISONE 10 MG PO TABS: ORAL_TABLET | ORAL | 0 refills | Status: DC

## 2018-02-09 MED ORDER — GLIPIZIDE 5 MG PO TABS: 5.0000 mg | ORAL_TABLET | Freq: Two times a day (BID) | ORAL | 1 refills | Status: DC

## 2018-02-09 MED ORDER — HYDROCOD POLST-CPM POLST ER 10-8 MG/5ML PO SUER: 5.0000 mL | Freq: Every evening | ORAL | 0 refills | Status: DC | PRN

## 2018-02-09 MED ORDER — LEVOFLOXACIN 500 MG PO TABS: 500.0000 mg | ORAL_TABLET | Freq: Every day | ORAL | 0 refills | Status: DC

## 2018-02-09 NOTE — Patient Instructions (Addendum)
You have a viral URI   For your allergies ,  you should  consider adding one of these newer second generation antihistamines that are longer acting, non sedating and  available OTC:  Generic  Zyrtec, which is cetirizine.    generic Allegra , available generically as fexofenadine ; comes in 60 mg and 180 mg once daily strengths.    Generic Claritin :  also available as loratidine .    Adding  6 day prednisone  taper  tussionex at bedtime for harsh cough,  Delsym for daytime cough   I f you fevelop facial pain ,  Worsening ear pain ,  Or green snot,  Start the antibiotic (levaquin)   Taking an antibiotic can create an imbalance in the normal population of bacteria that live in the small intestine.  This imbalance can persist for 3 months.   Taking a probiotic ( Align, [place] or [place]), the generic version of one of these over the counter medications, or an alternative form (kombucha,  Yogurt, or another dietary source) for a minimum of 3 weeks may help prevent a serious antibiotic associated diarrhea  Called clostridium dificile colitis that occurs when the bacteria population is altered .  Taking a probiotic may also prevent vaginitis due to yeast infections and can be continued indefinitely if you feel that it improves your digestion or your elimination (bowels).     If your blood sugars go up over 300 ,  You can increase your glipizide dose to 10 mg twice daily until they start to drop below 200

## 2018-02-09 NOTE — Progress Notes (Signed)
Subjective:  Patient ID: [name], female    DOB: February 15, 1946  Age: 72 y.o. MRN: [id number]  CC: Diagnoses of Viral URI with cough and Immunosuppression due to drug therapy were pertinent to this visit.  HPI [name] presents for eval and treatment of  A "head cold "   Of 3 days duration  Sneezing,  PND,  Fatigue/lethargy.  Not sleeping due to recurrent nocturnal cough.  Denies fevers,  Body aches,  Ear pain sinus pain and sore throat . Using dayquil and nyquil aldong with delsyn not helping   Outpatient Medications Prior to Visit  Medication Sig Dispense Refill  . aspirin 81 MG tablet Take 1 tablet (81 mg total) by mouth daily. 30 tablet 2  . atorvastatin (LIPITOR) 80 MG tablet TAKE 1 TABLET (80 MG TOTAL) BY MOUTH DAILY. 90 tablet 1  . B-D INS SYR ULTRAFINE 1CC/31G 31G X 5/16" 1 ML MISC USE TO INJECT INSULIN TWICE DAILY 100 each 3  . blood glucose meter kit and supplies KIT Dispense based on patient and insurance preference. Use up to four times daily as directed. (FOR ICD-9 250.00, 250.01). 1 each 0  . calcium-vitamin D (OSCAL WITH D) 250-125 MG-UNIT per tablet Take 2 tablets by mouth daily.    . fish oil-omega-3 fatty acids 1000 MG capsule Take 2 g by mouth 2 (two) times daily.      . folic acid (FOLVITE) 0.5 MG tablet Take 0.5 mg by mouth daily.    Marland Kitchen glucose blood (ACCU-CHEK AVIVA PLUS) test strip Use to check blood sugar once daily Dx: E11.65 100 each 2  . losartan (COZAAR) 50 MG tablet TAKE 1 TABLET (50 MG TOTAL) BY MOUTH DAILY. 90 tablet 1  . methotrexate 50 MG/2ML injection INJECT 0.8 MLS SUBCUTANEOUSLY ONCE A WEEK  1  . Methotrexate, Anti-Rheumatic, (METHOTREXATE, PF, [place]) Inject 0.7 mLs into the skin once a week.     . Multiple Vitamin (MULTIVITAMIN) tablet Take 1 tablet by mouth daily.    . pantoprazole (PROTONIX) 40 MG tablet TAKE 1 TABLET BY MOUTH EVERY DAY 90 tablet 1  . Psyllium (METAMUCIL FIBER PO) Take by mouth.    Marland Kitchen tiZANidine (ZANAFLEX) 4 MG tablet TAKE 1 TABLET  (4 MG TOTAL) BY MOUTH 2 (TWO) TIMES DAILY AS NEEDED  2  . traMADol (ULTRAM) 50 MG tablet Take 1 tablet (50 mg total) by mouth every 8 (eight) hours as needed. 30 tablet 0  . vitamin C (ASCORBIC ACID) 500 MG tablet Take 500 mg by mouth daily.    . vitamin E 100 UNIT capsule Take 100 Units by mouth daily.    Marland Kitchen glipiZIDE (GLUCOTROL) 5 MG tablet TAKE 1 TABLET (5 MG TOTAL) BY MOUTH 2 (TWO) TIMES DAILY BEFORE A MEAL. 180 tablet 1   No facility-administered medications prior to visit.     Review of Systems;  Patient denies headache, fevers, malaise, unintentional weight loss, skin rash, eye pain, sinus congestion and sinus pain, sore throat, dysphagia,  hemoptysis , cough, dyspnea, wheezing, chest pain, palpitations, orthopnea, edema, abdominal pain, nausea, melena, diarrhea, constipation, flank pain, dysuria, hematuria, urinary  Frequency, nocturia, numbness, tingling, seizures,  Focal weakness, Loss of consciousness,  Tremor, insomnia, depression, anxiety, and suicidal ideation.      Objective:  BP (!) 136/92 (BP Location: Left Arm, Patient Position: Sitting, Cuff Size: Normal)   Pulse 63   Temp 98 F (36.7 C) (Oral)   Resp 16   Ht 5' 6" (1.676 m)  Wt 180 lb 12.8 oz (82 kg)   SpO2 98%   BMI 29.18 kg/m   BP Readings from Last 3 Encounters:  02/09/18 (!) 136/92  11/05/17 124/74  08/02/17 130/74    Wt Readings from Last 3 Encounters:  02/09/18 180 lb 12.8 oz (82 kg)  11/05/17 176 lb 12.8 oz (80.2 kg)  08/02/17 178 lb 9.6 oz (81 kg)    General appearance: alert, cooperative and appears stated age Ears: normal TM's and external ear canals both ears Throat: lips, mucosa, and tongue normal; teeth and gums normal Neck: no adenopathy, no carotid bruit, supple, symmetrical, trachea midline and thyroid not enlarged, symmetric, no tenderness/mass/nodules Back: symmetric, no curvature. ROM normal. No CVA tenderness. Lungs: clear to auscultation bilaterally Heart: regular rate and rhythm,  S1, S2 normal, no murmur, click, rub or gallop Abdomen: soft, non-tender; bowel sounds normal; no masses,  no organomegaly Pulses: 2+ and symmetric Skin: Skin color, texture, turgor normal. No rashes or lesions Lymph nodes: Cervical, supraclavicular, and axillary nodes normal.  Lab Results  Component Value Date   HGBA1C 7.0 (H) 11/03/2017   HGBA1C 8.1 (H) 07/29/2017   HGBA1C 7.2 (H) 04/05/2017    Lab Results  Component Value Date   CREATININE 0.73 11/03/2017   CREATININE 0.74 07/29/2017   CREATININE 0.75 04/05/2017    Lab Results  Component Value Date   WBC 4.0 12/24/2016   HGB 14.0 12/24/2016   HCT 42.2 12/24/2016   PLT 225.0 12/24/2016   GLUCOSE 134 (H) 11/03/2017   CHOL 131 11/03/2017   TRIG 87.0 11/03/2017   HDL 56.90 11/03/2017   LDLDIRECT 81 07/29/2017   LDLCALC 57 11/03/2017   ALT 19 11/03/2017   AST 29 11/03/2017   NA 138 11/03/2017   K 4.3 11/03/2017   CL 103 11/03/2017   CREATININE 0.73 11/03/2017   BUN 13 11/03/2017   CO2 29 11/03/2017   TSH 2.34 12/24/2016   HGBA1C 7.0 (H) 11/03/2017   MICROALBUR <0.7 11/03/2017    Mm Outside Films Mammo  Result Date: 06/04/2017 This examination belongs to an outside facility and is stored here for comparison purposes only.  Contact the originating outside institution for any associated report or interpretation.   Assessment & Plan:   Problem List Items Addressed This Visit    Immunosuppression due to drug therapy    Taking MTX for RA.      Viral URI with cough    Current exam is consistent with viral URI .   Recommended treatment with oral and topcal decongestants,and a 6 day prednisone  taper.  Advised to add antibiotic for development of ear pain, sinus  pain or purulent sinus drainage, or failure to improve in 48 hours. Probiotic advised if antibiotics are started.          A total of 15 minutes of face to face time was spent with patient more than half of which was spent in counselling about the above  mentioned conditions  and coordination of care   I am having Marcell Anger start on predniSONE, chlorpheniramine-HYDROcodone, and levofloxacin. I am also having her maintain her fish oil-omega-3 fatty acids, vitamin C, vitamin E, multivitamin, aspirin, calcium-vitamin D, blood glucose meter kit and supplies, (Methotrexate, Anti-Rheumatic, (METHOTREXATE, PF, )), B-D INS SYR ULTRAFINE 1CC/31G, traMADol, tiZANidine, Psyllium (METAMUCIL FIBER PO), losartan, folic acid, glucose blood, atorvastatin, pantoprazole, methotrexate, and glipiZIDE.  Meds ordered this encounter  Medications  . predniSONE (DELTASONE) 10 MG tablet    Sig: 6 tablets  on Day 1 , then reduce by 1 tablet daily until gone    Dispense:  21 tablet    Refill:  0  . chlorpheniramine-HYDROcodone (TUSSIONEX PENNKINETIC ER) 10-8 MG/5ML SUER    Sig: Take 5 mLs by mouth at bedtime as needed for cough.    Dispense:  140 mL    Refill:  0  . levofloxacin (LEVAQUIN) 500 MG tablet    Sig: Take 1 tablet (500 mg total) by mouth daily.    Dispense:  7 tablet    Refill:  0  . glipiZIDE (GLUCOTROL) 5 MG tablet    Sig: Take 1 tablet (5 mg total) by mouth 2 (two) times daily before a meal.    Dispense:  180 tablet    Refill:  1    Medications Discontinued During This Encounter  Medication Reason  . glipiZIDE (GLUCOTROL) 5 MG tablet Reorder    Follow-up: No follow-ups on file.   Crecencio Mc, MD

## 2018-02-12 DIAGNOSIS — J069 Acute upper respiratory infection, unspecified: Secondary | ICD-10-CM | POA: Insufficient documentation

## 2018-02-12 DIAGNOSIS — B9789 Other viral agents as the cause of diseases classified elsewhere: Principal | ICD-10-CM

## 2018-02-12 DIAGNOSIS — D84821 Immunodeficiency due to drugs: Secondary | ICD-10-CM | POA: Insufficient documentation

## 2018-02-12 DIAGNOSIS — Z79899 Other long term (current) drug therapy: Secondary | ICD-10-CM | POA: Insufficient documentation

## 2018-02-12 NOTE — Assessment & Plan Note (Signed)
Taking MTX for RA.

## 2018-02-12 NOTE — Assessment & Plan Note (Signed)
Current exam is consistent with viral URI .   Recommended treatment with oral and topcal decongestants,and a 6 day prednisone  taper.  Advised to add antibiotic for development of ear pain, sinus  pain or purulent sinus drainage, or failure to improve in 48 hours. Probiotic advised if antibiotics are started.

## 2018-03-29 ENCOUNTER — Encounter: Payer: Self-pay | Admitting: Radiology

## 2018-04-09 ENCOUNTER — Other Ambulatory Visit: Payer: Self-pay | Admitting: Internal Medicine

## 2018-04-15 ENCOUNTER — Ambulatory Visit: Payer: Medicare Other

## 2018-04-28 ENCOUNTER — Ambulatory Visit (INDEPENDENT_AMBULATORY_CARE_PROVIDER_SITE_OTHER): Payer: Medicare Other

## 2018-04-28 ENCOUNTER — Other Ambulatory Visit: Payer: Self-pay

## 2018-04-28 VITALS — BP 118/68 | HR 77 | Temp 97.5°F | Resp 16 | Ht 65.0 in | Wt 180.1 lb

## 2018-04-28 DIAGNOSIS — Z0000 Encounter for general adult medical examination without abnormal findings: Secondary | ICD-10-CM

## 2018-04-28 NOTE — Patient Instructions (Addendum)
  [name] , Thank you for taking time to come for your Medicare Wellness Visit. I appreciate your ongoing commitment to your health goals. Please review the following plan we discussed and let me know if I can assist you in the future.   These are the goals we discussed: Goals    . Peak Blood Glucose < 180       This is a list of the screening recommended for you and due dates:  Health Maintenance  Topic Date Due  . Hemoglobin A1C  05/05/2018  . Mammogram  06/02/2018  . Complete foot exam   11/06/2018  . Eye exam for diabetics  01/11/2019  . Tetanus Vaccine  08/09/2023  . Colon Cancer Screening  02/03/2024  . Flu Shot  Completed  . DEXA scan (bone density measurement)  Completed  .  Hepatitis C: One time screening is recommended by Center for Disease Control  (CDC) for  adults born from [birth year] through 1964.   Completed  . Pneumonia vaccines  Completed

## 2018-04-28 NOTE — Progress Notes (Addendum)
Subjective:   [name] is a 72 y.o. female who presents for Medicare Annual (Subsequent) preventive examination.  Review of Systems:  No ROS.  Medicare Wellness Visit. Additional risk factors are reflected in the social history. Cardiac Risk Factors include: advanced age (>26mn, >>12women)     Objective:     Vitals: BP 118/68 (BP Location: Left Arm, Patient Position: Sitting, Cuff Size: Normal)   Pulse 77   Temp (!) 97.5 F (36.4 C) (Oral)   Resp 16   Ht _0  (1.651 m)   Wt 180 lb 1.9 oz (81.7 kg)   SpO2 96%   BMI 29.97 kg/m   Body mass index is 29.97 kg/m.  Advanced Directives 04/28/2018 04/13/2017 01/01/2016 04/01/2015 11/05/2014 09/12/2014  Does Patient Have a Medical Advance Directive? _1  Yes  Type of Advance Directive - - - - - Living will  Does patient want to make changes to medical advance directive? - - - - - Yes - information given  Would patient like information on creating a medical advance directive? No - Patient declined No - Patient declined No - patient declined information Yes - EScientist, clinical (histocompatibility and immunogenetics)given Yes - EScientist, clinical (histocompatibility and immunogenetics)given Yes - EScientist, clinical (histocompatibility and immunogenetics)given    Tobacco Social History   Tobacco Use  Smoking Status Never Smoker  Smokeless Tobacco Never Used     Counseling given: Not Answered   Clinical Intake:  Pre-visit preparation completed: Yes  Pain : No/denies pain     Diabetes: Yes(Followed by pcp)  How often do you need to have someone help you when you read instructions, pamphlets, or other written materials from your doctor or pharmacy?: 1 - Never  Interpreter Needed?: No     Past Medical History:  Diagnosis Date  . Diabetes mellitus   . Gastritis   . High cholesterol   . Hyperlipidemia    Lipitor trial was a failure  . Hypertension    Past Surgical History:  Procedure Laterality Date  . CARDIAC CATHETERIZATION  Augus 2007   normal coronaries,  [place] 60[place]     bone graft   . TUBAL LIGATION     Family History  Problem Relation Age of Onset  . Diabetes Mother   . Heart disease Father   . Stroke Father   . Diabetes Father   . Diabetes Maternal Grandfather   . Diabetes Brother   . Diabetes Brother   . Breast cancer Neg Hx    Social History   Socioeconomic History  . Marital status: Married    Spouse name: Not on file  . Number of children: Not on file  . Years of education: Not on file  . Highest education level: Not on file  Occupational History  . Occupation: RProgrammer, [employer]    Comment: Retired 2014  Social Needs  . Financial resource strain: Not hard at all  . Food insecurity:    Worry: Never true    Inability: Never true  . Transportation needs:    Medical: No    Non-medical: No  Tobacco Use  . Smoking status: Never Smoker  . Smokeless tobacco: Never Used  Substance and Sexual Activity  . Alcohol use: No  . Drug use: No  . Sexual activity: Yes    Partners: Male    Birth control/protection: None  Lifestyle  . Physical activity:    Days per  week: Not on file    Minutes per session: Not on file  . Stress: Not at all  Relationships  . Social connections:    Talks on phone: Not on file    Gets together: Not on file    Attends religious service: Not on file    Active member of club or organization: Not on file    Attends meetings of clubs or organizations: Not on file    Relationship status: Not on file  Other Topics Concern  . Not on file  Social History Narrative  . Not on file    Outpatient Encounter Medications as of 04/28/2018  Medication Sig  . aspirin 81 MG tablet Take 1 tablet (81 mg total) by mouth daily.  Marland Kitchen atorvastatin (LIPITOR) 80 MG tablet TAKE 1 TABLET (80 MG TOTAL) BY MOUTH DAILY.  Marland Kitchen B-D INS SYR ULTRAFINE 1CC/31G 31G X 5/16" 1 ML MISC USE TO INJECT INSULIN TWICE DAILY  . blood glucose meter kit and supplies KIT Dispense based on patient and insurance preference. Use up to  four times daily as directed. (FOR ICD-9 250.00, 250.01).  . calcium-vitamin D (OSCAL WITH D) 250-125 MG-UNIT per tablet Take 2 tablets by mouth daily.  . fish oil-omega-3 fatty acids 1000 MG capsule Take 2 g by mouth 2 (two) times daily.    . folic acid (FOLVITE) 0.5 MG tablet Take 0.5 mg by mouth daily.  Marland Kitchen glipiZIDE (GLUCOTROL) 5 MG tablet Take 1 tablet (5 mg total) by mouth 2 (two) times daily before a meal.  . glucose blood (ACCU-CHEK AVIVA PLUS) test strip Use to check blood sugar once daily Dx: E11.65  . losartan (COZAAR) 50 MG tablet TAKE 1 TABLET (50 MG TOTAL) BY MOUTH DAILY.  . methotrexate 50 MG/2ML injection INJECT 0.8 MLS SUBCUTANEOUSLY ONCE A WEEK  . Methotrexate, Anti-Rheumatic, (METHOTREXATE, PF, [place]) Inject 0.7 mLs into the skin once a week.   . Multiple Vitamin (MULTIVITAMIN) tablet Take 1 tablet by mouth daily.  . pantoprazole (PROTONIX) 40 MG tablet TAKE 1 TABLET BY MOUTH EVERY DAY  . Psyllium (METAMUCIL FIBER PO) Take by mouth.  Marland Kitchen tiZANidine (ZANAFLEX) 4 MG tablet TAKE 1 TABLET (4 MG TOTAL) BY MOUTH 2 (TWO) TIMES DAILY AS NEEDED  . traMADol (ULTRAM) 50 MG tablet Take 1 tablet (50 mg total) by mouth every 8 (eight) hours as needed.  . vitamin C (ASCORBIC ACID) 500 MG tablet Take 500 mg by mouth daily.  . vitamin E 100 UNIT capsule Take 100 Units by mouth daily.  . [DISCONTINUED] chlorpheniramine-HYDROcodone (TUSSIONEX PENNKINETIC ER) 10-8 MG/5ML SUER Take 5 mLs by mouth at bedtime as needed for cough.  . [DISCONTINUED] ferrous sulfate 325 (65 FE) MG tablet Take 325 mg by mouth daily with breakfast.    . [DISCONTINUED] levofloxacin (LEVAQUIN) 500 MG tablet Take 1 tablet (500 mg total) by mouth daily.  . [DISCONTINUED] omeprazole (PRILOSEC) 40 MG capsule Take 1 capsule (40 mg total) by mouth daily.  . [DISCONTINUED] predniSONE (DELTASONE) 10 MG tablet 6 tablets on Day 1 , then reduce by 1 tablet daily until gone   No facility-administered encounter medications on file as of  04/28/2018.     Activities of Daily Living In your present state of health, do you have any difficulty performing the following activities: 04/28/2018  Hearing? N  Vision? N  Difficulty concentrating or making decisions? N  Walking or climbing stairs? N  Dressing or bathing? N  Doing errands, shopping? N  Conservation officer, nature and  eating ? N  Using the Toilet? N  In the past six months, have you accidently leaked urine? N  Do you have problems with loss of bowel control? N  Managing your Medications? N  Managing your Finances? N  Housekeeping or managing your Housekeeping? N  Some recent data might be hidden    Patient Care Team: Crecencio Mc, MD as PCP - General (Internal Medicine) Emmaline Kluver., MD (Rheumatology) Osborne Oman, MD as Consulting Physician (Obstetrics and Gynecology)    Assessment:   This is a routine wellness examination for Icesis.  She plans to stop taking aspirin 2m after completing this bottle.  States she spoke with her Cardiologist and was told it was not necessary for her to continue.  Encouraged to follow up with primary care provider.  Next scheduled appointment 05/06/18.   Fasting labs to be completed 05/04/18.  Health Screenings  Mammogram-06/04/17 Colonoscopy-02/02/14 Bone Density-06/02/17 Glaucoma-none reported Hearing-passes the whisper test Vision-due 12/2018 Dental-every 6 months  Social  Alcohol intake-none  Smoking history- none Smokers in home?none Illicit drug use?no Exercise-walking, yoga, silver sneaker aerobic class Diet-regular Sexually Active-yes Multiple Partners-no  Safety  Patient feesl safe at home.-yes Patient does have smoke detectors at hNucor CorporationPatient does wear sunscreen or protective clothing when in direct sunlight-yes Patient does wear seat belt when driving or riding with others-yes  Activities of Daily Living Patient can do their own household chores. Denies needing assistance with: driving,  feeding themselves, getting from bed to chair, getting to the toilet, bathing/showering, dressing, managing money, climbing flight of stairs, or preparing meals.   Depression Screen Patient denies losing interest in daily life, feeling hopeless, or crying easily over simple problems.  Fall Screen Patient denies being afraid of falling or falling in the last year.   Memory Screen Patient denies problems with memory, misplacing items, and is able to balance checkbook/bank accounts.  Patient is alert, normal appearance, oriented to person/place/and time. Correctly identified the president of the [employer] recall of 2 objects, and performing simple calculations.  Patient displays appropriate judgement and can read correct time from watch face.   Immunizations The following Immunizations are up to date: Influenza, shingles, pneumonia, and tetanus.   Other Providers Patient Care Team: TCrecencio Mc MD as PCP - General (Internal Medicine) KEmmaline Kluver, MD (Rheumatology) AOsborne Oman MD as Consulting Physician (Obstetrics and Gynecology)  Exercise Activities and Dietary recommendations Current Exercise Habits: Home exercise routine, Intensity: Mild  Goals    . Peak Blood Glucose < 180       Fall Risk Fall Risk  04/28/2018 04/13/2017 03/12/2017 01/01/2016 09/12/2014  Falls in the past year? 0 No No No No   Depression Screen PHQ 2/9 Scores 04/28/2018 04/13/2017 03/12/2017 01/01/2016  PHQ - 2 Score 0 0 3 0  PHQ- 9 Score - 0 10 -     Cognitive Function MMSE - Mini Mental State Exam 04/13/2017 01/01/2016 09/12/2014  Orientation to time _0 Orientation to Place _1 Registration _2 Attention/ Calculation _3 Recall _4 Language- name 2 objects _5 Language- repeat _6 Language- follow 3 step command _7 Language- read & follow direction _8 Write a sentence _9 Copy design _10 Total score _11 6CIT Screen 04/28/2018  What  Year?  0 points  What month? 0 points  What time? 0 points  Count back from 20 0 points  Months in reverse 0 points    Immunization History  Administered Date(s) Administered  . Hep A / Hep B 01/01/2016, 02/04/2016, 07/03/2016  . Influenza Split 03/03/2012, 01/26/2013, 02/25/2015  . Influenza, High Dose Seasonal PF 02/20/2016  . Influenza,inj,Quad PF,6+ Mos 02/13/2014  . Influenza-Unspecified 02/19/2017  . Pneumococcal Conjugate-13 11/08/2013  . Pneumococcal Polysaccharide-23 03/03/2012  . Tdap 08/08/2013  . Zoster 04/20/2008  . Zoster Recombinat (Shingrix) 03/02/2018    Screening Tests Health Maintenance  Topic Date Due  . HEMOGLOBIN A1C  05/05/2018  . MAMMOGRAM  06/02/2018  . FOOT EXAM  11/06/2018  . OPHTHALMOLOGY EXAM  01/11/2019  . TETANUS/TDAP  08/09/2023  . COLONOSCOPY  02/03/2024  . INFLUENZA VACCINE  Completed  . DEXA SCAN  Completed  . Hepatitis C Screening  Completed  . PNA vac Low Risk Adult  Completed      Plan:    End of life planning; Advance aging; Advanced directives discussed. Copy of current HCPOA/Living Will requested upon completion.    I have personally reviewed and noted the following in the patient's chart:   . Medical and social history . Use of alcohol, tobacco or illicit drugs  . Current medications and supplements . Functional ability and status . Nutritional status . Physical activity . Advanced directives . List of other physicians . Hospitalizations, surgeries, and ER visits in previous 12 months . Vitals . Screenings to include cognitive, depression, and falls . Referrals and appointments  In addition, I have reviewed and discussed with patient certain preventive protocols, quality metrics, and best practice recommendations. A written personalized care plan for preventive services as well as general preventive health recommendations were provided to patient.     OBrien-Blaney, Denisa L, LPN  [id number]    I have reviewed the  above information and agree with above.   Deborra Medina, MD

## 2018-05-04 ENCOUNTER — Other Ambulatory Visit (INDEPENDENT_AMBULATORY_CARE_PROVIDER_SITE_OTHER): Payer: Medicare Other

## 2018-05-04 DIAGNOSIS — E119 Type 2 diabetes mellitus without complications: Secondary | ICD-10-CM | POA: Diagnosis not present

## 2018-05-04 DIAGNOSIS — Z79899 Other long term (current) drug therapy: Secondary | ICD-10-CM

## 2018-05-04 DIAGNOSIS — E782 Mixed hyperlipidemia: Secondary | ICD-10-CM | POA: Diagnosis not present

## 2018-05-04 LAB — LIPID PANEL
Cholesterol: 115 mg/dL (ref 0–200)
HDL: 48.4 mg/dL (ref >39.00)
LDL Cholesterol: 52 mg/dL (ref 0–99)
NonHDL: 66.32
Total CHOL/HDL Ratio: 2
Triglycerides: 71 mg/dL (ref 0.0–149.0)
VLDL: 14.2 mg/dL (ref 0.0–40.0)

## 2018-05-04 LAB — HEMOGLOBIN A1C: Hgb A1c MFr Bld: 7.2 % — ABNORMAL HIGH (ref 4.6–6.5)

## 2018-05-04 LAB — COMPREHENSIVE METABOLIC PANEL
ALT: 16 U/L (ref 0–35)
AST: 24 U/L (ref 0–37)
Albumin: 4.1 g/dL (ref 3.5–5.2)
Alkaline Phosphatase: 94 U/L (ref 39–117)
BUN: 12 mg/dL (ref 6–23)
CO2: 28 mEq/L (ref 19–32)
Calcium: 9.1 mg/dL (ref 8.4–10.5)
Chloride: 104 mEq/L (ref 96–112)
Creatinine, Ser: 0.76 mg/dL (ref 0.40–1.20)
GFR: 79.7 mL/min (ref >60.00)
Glucose, Bld: 127 mg/dL — ABNORMAL HIGH (ref 70–99)
Potassium: 4.3 mEq/L (ref 3.5–5.1)
Sodium: 139 mEq/L (ref 135–145)
Total Bilirubin: 0.8 mg/dL (ref 0.2–1.2)
Total Protein: 6.4 g/dL (ref 6.0–8.3)

## 2018-05-05 ENCOUNTER — Other Ambulatory Visit (HOSPITAL_COMMUNITY)
Admission: RE | Admit: 2018-05-05 | Discharge: 2018-05-05 | Disposition: A | Discharge status: Home / Self Care | Payer: Medicare Other | Source: Ambulatory Visit | Attending: Obstetrics & Gynecology | Admitting: Obstetrics & Gynecology

## 2018-05-05 ENCOUNTER — Encounter: Payer: Self-pay | Admitting: Obstetrics & Gynecology

## 2018-05-05 ENCOUNTER — Ambulatory Visit (INDEPENDENT_AMBULATORY_CARE_PROVIDER_SITE_OTHER): Payer: Medicare Other | Admitting: Obstetrics & Gynecology

## 2018-05-05 VITALS — BP 158/93 | HR 71 | Ht 66.0 in | Wt 179.0 lb

## 2018-05-05 DIAGNOSIS — Z1231 Encounter for screening mammogram for malignant neoplasm of breast: Secondary | ICD-10-CM

## 2018-05-05 DIAGNOSIS — R87810 Cervical high risk human papillomavirus (HPV) DNA test positive: Secondary | ICD-10-CM

## 2018-05-05 DIAGNOSIS — Z01419 Encounter for gynecological examination (general) (routine) without abnormal findings: Secondary | ICD-10-CM

## 2018-05-05 LAB — FOLATE RBC: RBC Folate: 718 ng/mL RBC (ref >280)

## 2018-05-05 NOTE — Progress Notes (Signed)
GYNECOLOGY ANNUAL PREVENTATIVE CARE ENCOUNTER NOTE  Subjective:   [name] is a 72 y.o. [phone] female here for a routine annual gynecologic exam.  Current complaints: none.   Denies abnormal vaginal bleeding, discharge, pelvic pain, problems with intercourse or other gynecologic concerns.    Gynecologic History No LMP recorded. Patient is postmenopausal. Contraception: none Normal cytology on pap smears, but positive high risk HPV in 02/28/13, 03/08/14, 03/22/15, 03/24/16 ans 04/20/2017. Persistent HPV since 2014 but negative 16, 18 and 45. 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 Had cryotherapy on 04/2016.  Last mammogram: 06/02/17. Results were: normal  Obstetric History OB History  Gravida Para Term Preterm AB Living  _0 0 0 2  SAB TAB Ectopic Multiple Live Births  0 0 0 0 2    # Outcome Date GA Lbr Len/2nd Weight Sex Delivery Anes PTL Lv  2 Term     M Vag-Spont   LIV  1 Term     M Vag-Spont   LIV    Past Medical History:  Diagnosis Date  . Diabetes mellitus   . Gastritis   . High cholesterol   . Hyperlipidemia    Lipitor trial was a failure  . Hypertension     Past Surgical History:  Procedure Laterality Date  . CARDIAC CATHETERIZATION  Augus 2007   normal coronaries,  EF 60%  . [place]  . SPINE SURGERY     bone graft   . TUBAL LIGATION      Current Outpatient Medications on File Prior to Visit  Medication Sig Dispense Refill  . aspirin 81 MG tablet Take 1 tablet (81 mg total) by mouth daily. 30 tablet 2  . atorvastatin (LIPITOR) 80 MG tablet TAKE 1 TABLET (80 MG TOTAL) BY MOUTH DAILY. 90 tablet 1  . B-D INS SYR ULTRAFINE 1CC/31G 31G X 5/16" 1 ML MISC USE TO INJECT INSULIN TWICE DAILY 100 each 3  . blood glucose meter kit and supplies KIT Dispense based on patient and insurance preference. Use up to four times daily as directed. (FOR ICD-9 250.00, 250.01). 1 each 0  . calcium-vitamin D (OSCAL WITH D) 250-125  MG-UNIT per tablet Take 2 tablets by mouth daily.    . fish oil-omega-3 fatty acids 1000 MG capsule Take 2 g by mouth 2 (two) times daily.      . folic acid (FOLVITE) 0.5 MG tablet Take 0.5 mg by mouth daily.    Marland Kitchen glipiZIDE (GLUCOTROL) 5 MG tablet Take 1 tablet (5 mg total) by mouth 2 (two) times daily before a meal. 180 tablet 1  . glucose blood (ACCU-CHEK AVIVA PLUS) test strip Use to check blood sugar once daily Dx: E11.65 100 each 2  . losartan (COZAAR) 50 MG tablet TAKE 1 TABLET (50 MG TOTAL) BY MOUTH DAILY. 90 tablet 1  . methotrexate 50 MG/2ML injection INJECT 0.8 MLS SUBCUTANEOUSLY ONCE A WEEK  1  . Methotrexate, Anti-Rheumatic, (METHOTREXATE, PF, ) Inject 0.7 mLs into the skin once a week.     . Multiple Vitamin (MULTIVITAMIN) tablet Take 1 tablet by mouth daily.    . pantoprazole (PROTONIX) 40 MG tablet TAKE 1 TABLET BY MOUTH EVERY DAY 90 tablet 1  . Psyllium (METAMUCIL FIBER PO) Take by mouth.    Marland Kitchen tiZANidine (ZANAFLEX) 4 MG tablet TAKE 1 TABLET (4 MG TOTAL) BY MOUTH 2 (TWO) TIMES DAILY AS NEEDED  2  . traMADol (ULTRAM) 50 MG tablet Take  1 tablet (50 mg total) by mouth every 8 (eight) hours as needed. 30 tablet 0  . vitamin C (ASCORBIC ACID) 500 MG tablet Take 500 mg by mouth daily.    . vitamin E 100 UNIT capsule Take 100 Units by mouth daily.    . [DISCONTINUED] ferrous sulfate 325 (65 FE) MG tablet Take 325 mg by mouth daily with breakfast.      . [DISCONTINUED] omeprazole (PRILOSEC) 40 MG capsule Take 1 capsule (40 mg total) by mouth daily. 30 capsule 1   No current facility-administered medications on file prior to visit.     Allergies  Allergen Reactions  . Amoxicillin Hives  . Latex Rash    Social History:  reports that she has never smoked. She has never used smokeless tobacco. She reports that she does not drink alcohol or use drugs.  Family History  Problem Relation Age of Onset  . Diabetes Mother   . Heart disease Father   . Stroke Father   . Diabetes Father    . Diabetes Maternal Grandfather   . Diabetes Brother   . Diabetes Brother   . Breast cancer Neg Hx     The following portions of the patient's history were reviewed and updated as appropriate: allergies, current medications, past family history, past medical history, past social history, past surgical history and problem list.  Review of Systems Pertinent items noted in HPI and remainder of comprehensive ROS otherwise negative.   Objective:  BP (!) 158/93   Pulse 71   Ht _0  (1.676 m)   Wt 179 lb (81.2 kg)   BMI 28.89 kg/m  CONSTITUTIONAL: Well-developed, well-nourished female in no acute distress.  HENT:  Normocephalic, atraumatic, External right and left ear normal. Oropharynx is clear and moist EYES: Conjunctivae and EOM are normal. Pupils are equal, round, and reactive to light. No scleral icterus.  NECK: Normal range of motion, supple, no masses.  Normal thyroid.  SKIN: Skin is warm and dry. No rash noted. Not diaphoretic. No erythema. No pallor. NEUROLOGIC: Alert and oriented to person, place, and time. Normal reflexes, muscle tone coordination. No cranial nerve deficit noted. PSYCHIATRIC: Normal mood and affect. Normal behavior. Normal judgment and thought content. CARDIOVASCULAR: Normal heart rate noted, regular rhythm RESPIRATORY: Clear to auscultation bilaterally. Effort and breath sounds normal, no problems with respiration noted. BREASTS: Symmetric in size. No masses, skin changes, nipple drainage, or lymphadenopathy. ABDOMEN: Soft, normal bowel sounds, no distention noted.  No tenderness, rebound or guarding.  PELVIC: Normal appearing external genitalia; normal appearing vaginal mucosa with moderate atrophy and cervix. No abnormal discharge noted. Pap smear obtained. Normal uterine size, no other palpable masses, no uterine or adnexal tenderness. MUSCULOSKELETAL: Normal range of motion. No tenderness.  No cyanosis, clubbing, or edema.  2+ distal  pulses.   Assessment and Plan:  1. Encounter for screening mammogram for breast cancer - MM 3D SCREEN BREAST BILATERAL; Future Mammogram scheduled  2. Normal cytology on pap smears, but positive high risk HPV in 02/28/13, 03/08/14, 03/22/15, 03/24/16, 04/20/17. Negative for HPV 16 and 18/45. 3. Well woman exam with routine gynecological exam - Cytology - PAP( [facility]) Will follow up results of pap smear and manage accordingly. Will just continue yearly screening. No further intervention needed unless there is a cytology anomaly or positive for 16, 18/45.  Routine preventative health maintenance measures emphasized. Please refer to After Visit Summary for other counseling recommendations.    Verita Schneiders, MD, [place],  Product/process development scientist for [employer], [place]

## 2018-05-05 NOTE — Patient Instructions (Signed)
Preventive Care 42 Years and Older, Female Preventive care refers to lifestyle choices and visits with your health care provider that can promote health and wellness. What does preventive care include?  A yearly physical exam. This is also called an annual well check.  Dental exams once or twice a year.  Routine eye exams. Ask your health care provider how often you should have your eyes checked.  Personal lifestyle choices, including: ? Daily care of your teeth and gums. ? Regular physical activity. ? Eating a healthy diet. ? Avoiding tobacco and drug use. ? Limiting alcohol use. ? Practicing safe sex. ? Taking low-dose aspirin every day. ? Taking vitamin and mineral supplements as recommended by your health care provider. What happens during an annual well check? The services and screenings done by your health care provider during your annual well check will depend on your age, overall health, lifestyle risk factors, and family history of disease. Counseling Your health care provider may ask you questions about your:  Alcohol use.  Tobacco use.  Drug use.  Emotional well-being.  Home and relationship well-being.  Sexual activity.  Eating habits.  History of falls.  Memory and ability to understand (cognition).  Work and work Statistician.  Reproductive health.  Screening You may have the following tests or measurements:  Height, weight, and BMI.  Blood pressure.  Lipid and cholesterol levels. These may be checked every 5 years, or more frequently if you are over 30 years old.  Skin check.  Lung cancer screening. You may have this screening every year starting at age 27 if you have a 30-pack-year history of smoking and currently smoke or have quit within the past 15 years.  Colorectal cancer screening. All adults should have this screening starting at age 33 and continuing until age 46. You will have tests every 1-10 years, depending on your results and the  type of screening test. People at increased risk should start screening at an earlier age. Screening tests may include: ? Guaiac-based fecal occult blood testing. ? Fecal immunochemical test (FIT). ? Stool DNA test. ? Virtual colonoscopy. ? Sigmoidoscopy. During this test, a flexible tube with a tiny camera (sigmoidoscope) is used to examine your rectum and lower colon. The sigmoidoscope is inserted through your anus into your rectum and lower colon. ? Colonoscopy. During this test, a long, thin, flexible tube with a tiny camera (colonoscope) is used to examine your entire colon and rectum.  Hepatitis C blood test.  Hepatitis B blood test.  Sexually transmitted disease (STD) testing.  Diabetes screening. This is done by checking your blood sugar (glucose) after you have not eaten for a while (fasting). You may have this done every 1-3 years.  Bone density scan. This is done to screen for osteoporosis. You may have this done starting at age 37.  Mammogram. This may be done every 1-2 years. Talk to your health care provider about how often you should have regular mammograms. Talk with your health care provider about your test results, treatment options, and if necessary, the need for more tests. Vaccines Your health care provider may recommend certain vaccines, such as:  Influenza vaccine. This is recommended every year.  Tetanus, diphtheria, and acellular pertussis (Tdap, Td) vaccine. You may need a Td booster every 10 years.  Varicella vaccine. You may need this if you have not been vaccinated.  Zoster vaccine. You may need this after age 38.  Measles, mumps, and rubella (MMR) vaccine. You may need at least  one dose of MMR if you were born in 1957 or later. You may also need a second dose.  Pneumococcal 13-valent conjugate (PCV13) vaccine. One dose is recommended after age 35.  Pneumococcal polysaccharide (PPSV23) vaccine. One dose is recommended after age 68.  Meningococcal  vaccine. You may need this if you have certain conditions.  Hepatitis A vaccine. You may need this if you have certain conditions or if you travel or work in places where you may be exposed to hepatitis A.  Hepatitis B vaccine. You may need this if you have certain conditions or if you travel or work in places where you may be exposed to hepatitis B.  Haemophilus influenzae type b (Hib) vaccine. You may need this if you have certain conditions. Talk to your health care provider about which screenings and vaccines you need and how often you need them. This information is not intended to replace advice given to you by your health care provider. Make sure you discuss any questions you have with your health care provider. Document Released: 05/31/2015 Document Revised: 06/24/2017 Document Reviewed: 03/05/2015 Elsevier Interactive Patient Education  2019 [employer].

## 2018-05-06 ENCOUNTER — Ambulatory Visit: Payer: Medicare Other | Admitting: Internal Medicine

## 2018-05-06 ENCOUNTER — Encounter: Payer: Self-pay | Admitting: Internal Medicine

## 2018-05-06 VITALS — BP 128/80 | HR 74 | Temp 97.4°F | Resp 15 | Ht 66.0 in | Wt 180.4 lb

## 2018-05-06 DIAGNOSIS — I208 Other forms of angina pectoris: Secondary | ICD-10-CM | POA: Diagnosis not present

## 2018-05-06 DIAGNOSIS — Q211 Atrial septal defect: Secondary | ICD-10-CM

## 2018-05-06 DIAGNOSIS — K2950 Unspecified chronic gastritis without bleeding: Secondary | ICD-10-CM

## 2018-05-06 DIAGNOSIS — E119 Type 2 diabetes mellitus without complications: Secondary | ICD-10-CM | POA: Diagnosis not present

## 2018-05-06 DIAGNOSIS — Q2112 Patent foramen ovale: Secondary | ICD-10-CM

## 2018-05-06 DIAGNOSIS — E782 Mixed hyperlipidemia: Secondary | ICD-10-CM

## 2018-05-06 MED ORDER — GLIPIZIDE 5 MG PO TABS: 5.0000 mg | ORAL_TABLET | Freq: Two times a day (BID) | ORAL | 1 refills | Status: DC

## 2018-05-06 MED ORDER — FAMOTIDINE 20 MG PO TABS: 20.0000 mg | ORAL_TABLET | Freq: Every day | ORAL | 2 refills | Status: DC

## 2018-05-06 NOTE — Patient Instructions (Addendum)
Your diabetes remains under excellent control  And your cholesterol and liver enzymes/kidney function  are also normal.  I recommend getting vaccinated for Hepatitis A and B  In the near future  because of your diagnosis  of fatty liver    ADDING FAMOTIDINE 20 MG IN THE AFTERNOON FOR REFLUX .  CONTINUE PROTONIX IN THE MORNING   IF SYMPTOMS PERSIST,  LET ME KNOW;   YOU WILL NEED CONSULT FOR ENDOSCOPY

## 2018-05-06 NOTE — Progress Notes (Signed)
Subjective:  Patient ID: [name], female    DOB: January 28, 1946  Age: 72 y.o. MRN: [id number]  CC: The primary encounter diagnosis was Diabetes mellitus with no complication ([place]). Diagnoses of Stable angina pectoris ([place]), PFO (patent foramen ovale), Other chronic gastritis without hemorrhage, and Mixed hyperlipidemia were also pertinent to this visit.  HPI HUDSON LEHMKUHL presents for 3 month follow up on diabetes.  Patient has no new complaints today.  Patient is following a low glycemic index diet and taking all prescribed medications regularly without side effects.  Reducing the glipziie dose to 1/2 tablet on the days she exerciseS to avoid recurrent hypoglycemia. .    Fasting sugars have been under  140 most of the time and post prandials have been under 160 except on rare occasions. Patient is exercising about 3 times per week and intentionally trying to lose weight .  Patient has had an eye exam in the last 12 months and checks feet regularly for signs of infection.  Patient does not walk barefoot outside,  And denies an numbness tingling or burning in feet. Patient is up to date on all recommended vaccinations  Treated with steroids in late september ' cbgs were HIGH  for 2 weeks   She continues to have episodes of  CHEST PAIN that occur randomly , both at rest and wth activity  That  radiates to her jaw .  She has been seen by Cardiology for symptoms and cardiac workup was normal.  reviewed workup with patient today. The pain does not occur with exercise  She has a PATENT FORAMEN OVALE and MILD LVH  history of GERD:  HM:  She continues screening for cervical CA due to recurrent  positive  HPV since 2014.  last one yesterday    Outpatient Medications Prior to Visit  Medication Sig Dispense Refill  . atorvastatin (LIPITOR) 80 MG tablet TAKE 1 TABLET (80 MG TOTAL) BY MOUTH DAILY. 90 tablet 1  . B-D INS SYR ULTRAFINE 1CC/31G 31G X 5/16" 1 ML MISC USE TO INJECT INSULIN TWICE DAILY 100 each  3  . blood glucose meter kit and supplies KIT Dispense based on patient and insurance preference. Use up to four times daily as directed. (FOR ICD-9 250.00, 250.01). 1 each 0  . calcium-vitamin D (OSCAL WITH D) 250-125 MG-UNIT per tablet Take 2 tablets by mouth daily.    . folic acid (FOLVITE) 0.5 MG tablet Take 0.5 mg by mouth daily.    Marland Kitchen glucose blood (ACCU-CHEK AVIVA PLUS) test strip Use to check blood sugar once daily Dx: E11.65 100 each 2  . losartan (COZAAR) 50 MG tablet TAKE 1 TABLET (50 MG TOTAL) BY MOUTH DAILY. 90 tablet 1  . methotrexate 50 MG/2ML injection INJECT 0.8 MLS SUBCUTANEOUSLY ONCE A WEEK  1  . Methotrexate, Anti-Rheumatic, (METHOTREXATE, PF, [place]) Inject 0.7 mLs into the skin once a week.     . Multiple Vitamin (MULTIVITAMIN) tablet Take 1 tablet by mouth daily.    . pantoprazole (PROTONIX) 40 MG tablet TAKE 1 TABLET BY MOUTH EVERY DAY 90 tablet 1  . Psyllium (METAMUCIL FIBER PO) Take by mouth.    . vitamin C (ASCORBIC ACID) 500 MG tablet Take 500 mg by mouth daily.    . vitamin E 100 UNIT capsule Take 100 Units by mouth daily.    Marland Kitchen glipiZIDE (GLUCOTROL) 5 MG tablet Take 1 tablet (5 mg total) by mouth 2 (two) times daily before a meal. 180 tablet 1  .  aspirin 81 MG tablet Take 1 tablet (81 mg total) by mouth daily. (Patient not taking: Reported on 05/06/2018) 30 tablet 2  . fish oil-omega-3 fatty acids 1000 MG capsule Take 2 g by mouth 2 (two) times daily.      Marland Kitchen tiZANidine (ZANAFLEX) 4 MG tablet TAKE 1 TABLET (4 MG TOTAL) BY MOUTH 2 (TWO) TIMES DAILY AS NEEDED  2  . traMADol (ULTRAM) 50 MG tablet Take 1 tablet (50 mg total) by mouth every 8 (eight) hours as needed. (Patient not taking: Reported on 05/06/2018) 30 tablet 0   No facility-administered medications prior to visit.     Review of Systems;  Patient denies headache, fevers, malaise, unintentional weight loss, skin rash, eye pain, sinus congestion and sinus pain, sore throat, dysphagia,  hemoptysis , cough, dyspnea,  wheezing, chest pain, palpitations, orthopnea, edema, abdominal pain, nausea, melena, diarrhea, constipation, flank pain, dysuria, hematuria, urinary  Frequency, nocturia, numbness, tingling, seizures,  Focal weakness, Loss of consciousness,  Tremor, insomnia, depression, anxiety, and suicidal ideation.      Objective:  BP 128/80 (BP Location: Left Arm, Patient Position: Sitting, Cuff Size: Normal)   Pulse 74   Temp (!) 97.4 F (36.3 C) (Oral)   Resp 15   Ht 5' 6"  (1.676 m)   Wt 180 lb 6.4 oz (81.8 kg)   SpO2 97%   BMI 29.12 kg/m   BP Readings from Last 3 Encounters:  05/06/18 128/80  05/05/18 (!) 158/93  04/28/18 118/68    Wt Readings from Last 3 Encounters:  05/06/18 180 lb 6.4 oz (81.8 kg)  05/05/18 179 lb (81.2 kg)  04/28/18 180 lb 1.9 oz (81.7 kg)    General appearance: alert, cooperative and appears stated age Ears: normal TM's and external ear canals both ears Throat: lips, mucosa, and tongue normal; teeth and gums normal Neck: no adenopathy, no carotid bruit, supple, symmetrical, trachea midline and thyroid not enlarged, symmetric, no tenderness/mass/nodules Back: symmetric, no curvature. ROM normal. No CVA tenderness. Lungs: clear to auscultation bilaterally Heart: regular rate and rhythm, S1, S2 normal, no murmur, click, rub or gallop Abdomen: soft, non-tender; bowel sounds normal; no masses,  no organomegaly Pulses: 2+ and symmetric Skin: Skin color, texture, turgor normal. No rashes or lesions Lymph nodes: Cervical, supraclavicular, and axillary nodes normal.  Lab Results  Component Value Date   HGBA1C 7.2 (H) 05/04/2018   HGBA1C 7.0 (H) 11/03/2017   HGBA1C 8.1 (H) 07/29/2017    Lab Results  Component Value Date   CREATININE 0.76 05/04/2018   CREATININE 0.73 11/03/2017   CREATININE 0.74 07/29/2017    Lab Results  Component Value Date   WBC 4.0 12/24/2016   HGB 14.0 12/24/2016   HCT 42.2 12/24/2016   PLT 225.0 12/24/2016   GLUCOSE 127 (H)  05/04/2018   CHOL 115 05/04/2018   TRIG 71.0 05/04/2018   HDL 48.40 05/04/2018   LDLDIRECT 81 07/29/2017   LDLCALC 52 05/04/2018   ALT 16 05/04/2018   AST 24 05/04/2018   NA 139 05/04/2018   K 4.3 05/04/2018   CL 104 05/04/2018   CREATININE 0.76 05/04/2018   BUN 12 05/04/2018   CO2 28 05/04/2018   TSH 2.34 12/24/2016   HGBA1C 7.2 (H) 05/04/2018   MICROALBUR <0.7 11/03/2017    Mm Outside Films Mammo  Result Date: 06/04/2017 This examination belongs to an outside facility and is stored here for comparison purposes only.  Contact the originating outside institution for any associated report or interpretation.  Assessment & Plan:   Problem List Items Addressed This Visit    Diabetes mellitus with no complication ([place]) - Primary    Improving control with low GI diet and exercise .  hemoglobin A1c is close to goal.. Patient is up to date on  eye exam and foot exam was done today.  There is  no proteinuria on prior micro urinalysis .  Advised by Nehemiah Massed to disconinue  asa and continue atorvastatin.   Lab Results  Component Value Date   HGBA1C 7.2 (H) 05/04/2018  ' Lab Results  Component Value Date   MICROALBUR <0.7 11/03/2017         Relevant Medications   glipiZIDE (GLUCOTROL) 5 MG tablet   Other Relevant Orders   Comprehensive metabolic panel   Hemoglobin A1c   Gastritis    Managed with protonix, in the am.  Adding evening famotidine 20 mg for chest pain occurring at rest .      Hyperlipidemia    LDL and triglycerides are at goal on atorvastatin  80 mg daily .  She  has no side effects and liver enzymes are normal. No changes today.  Lab Results  Component Value Date   CHOL 115 05/04/2018   HDL 48.40 05/04/2018   LDLCALC 52 05/04/2018   LDLDIRECT 81 07/29/2017   TRIG 71.0 05/04/2018   CHOLHDL 2 05/04/2018   Lab Results  Component Value Date   ALT 16 05/04/2018   AST 24 05/04/2018   ALKPHOS 94 05/04/2018   BILITOT 0.8 05/04/2018               PFO (patent foramen ovale)   Stable angina pectoris ([facility])    Normal  recent  non invasive cardiac evaluation .  Will treat GERD more aggressively.  Advised in May by Dr Nehemiah Massed to discontinue aspirin :  "We have evaluated the patient for the current recommended use of aspirin. The patient has >10% ten year cardiovascular risk score, diabetes, hypertension and hyperlipidemia and therefore it is not recommended that they should take aspirin each day based on recent guidelines and studies. The ASCEND study shows that patients with risk factors for cardiovascular disease have a reduced risk of major events of 5%, but this was negated by a 4.1% risk of GI bleed and or intracranial bleeding. Patients with known cardiovascular disease have a 12-18% reduction in events with aspirin use with the same risk of bleeding"          A total of 25 minutes of face to face time was spent with patient more than half of which was spent in counselling about the above mentioned conditions  and coordination of care   I have discontinued Brodie Scovell. [place]'s traMADol and tiZANidine. I am also having her start on famotidine. Additionally, I am having her maintain her fish oil-omega-3 fatty acids, vitamin C, vitamin E, multivitamin, aspirin, calcium-vitamin D, blood glucose meter kit and supplies, (Methotrexate, Anti-Rheumatic, (METHOTREXATE, PF, [place])), B-D INS SYR ULTRAFINE 1CC/31G, Psyllium (METAMUCIL FIBER PO), folic acid, glucose blood, atorvastatin, pantoprazole, methotrexate, losartan, and glipiZIDE.  Meds ordered this encounter  Medications  . glipiZIDE (GLUCOTROL) 5 MG tablet    Sig: Take 1 tablet (5 mg total) by mouth 2 (two) times daily before a meal.    Dispense:  180 tablet    Refill:  1  . famotidine (PEPCID) 20 MG tablet    Sig: Take 1 tablet (20 mg total) by mouth daily. [place]  Dispense:  30 tablet    Refill:  2    Medications Discontinued During This Encounter  Medication Reason  .  tiZANidine (ZANAFLEX) 4 MG tablet Patient has not taken in last 30 days  . traMADol (ULTRAM) 50 MG tablet Patient has not taken in last 30 days  . glipiZIDE (GLUCOTROL) 5 MG tablet Reorder    Follow-up: Return in about 3 months (around 08/05/2018) for follow up diabetes.   Crecencio Mc, MD

## 2018-05-09 ENCOUNTER — Ambulatory Visit: Payer: Medicare Other | Admitting: Internal Medicine

## 2018-05-09 ENCOUNTER — Encounter: Payer: Self-pay | Admitting: Internal Medicine

## 2018-05-09 DIAGNOSIS — Q211 Atrial septal defect: Secondary | ICD-10-CM | POA: Insufficient documentation

## 2018-05-09 DIAGNOSIS — Q2112 Patent foramen ovale: Secondary | ICD-10-CM | POA: Insufficient documentation

## 2018-05-09 NOTE — Assessment & Plan Note (Addendum)
Improving control with low GI diet and exercise .  hemoglobin A1c is close to goal.. Patient is up to date on  eye exam and foot exam was done today.  There is  no proteinuria on prior micro urinalysis .  Advised by Nehemiah Massed to disconinue  asa and continue atorvastatin.   Lab Results  Component Value Date   HGBA1C 7.2 (H) 05/04/2018  ' Lab Results  Component Value Date   MICROALBUR <0.7 11/03/2017

## 2018-05-09 NOTE — Assessment & Plan Note (Signed)
Managed with protonix, in the am.  Adding evening famotidine 20 mg for chest pain occurring at rest .

## 2018-05-09 NOTE — Assessment & Plan Note (Addendum)
Normal  recent  non invasive cardiac evaluation .  Will treat GERD more aggressively.  Advised in May by Dr Nehemiah Massed to discontinue aspirin :  "We have evaluated the patient for the current recommended use of aspirin. The patient has >10% ten year cardiovascular risk score, diabetes, hypertension and hyperlipidemia and therefore it is not recommended that they should take aspirin each day based on recent guidelines and studies. The ASCEND study shows that patients with risk factors for cardiovascular disease have a reduced risk of major events of 5%, but this was negated by a 4.1% risk of GI bleed and or intracranial bleeding. Patients with known cardiovascular disease have a 12-18% reduction in events with aspirin use with the same risk of bleeding"

## 2018-05-09 NOTE — Assessment & Plan Note (Signed)
LDL and triglycerides are at goal on atorvastatin  80 mg daily .  She  has no side effects and liver enzymes are normal. No changes today.  Lab Results  Component Value Date   CHOL 115 05/04/2018   HDL 48.40 05/04/2018   LDLCALC 52 05/04/2018   LDLDIRECT 81 07/29/2017   TRIG 71.0 05/04/2018   CHOLHDL 2 05/04/2018   Lab Results  Component Value Date   ALT 16 05/04/2018   AST 24 05/04/2018   ALKPHOS 94 05/04/2018   BILITOT 0.8 05/04/2018

## 2018-05-10 LAB — CYTOLOGY - PAP
Diagnosis: NEGATIVE
HPV 16/18/45 genotyping: NEGATIVE
HPV: DETECTED — AB

## 2018-06-02 ENCOUNTER — Encounter: Payer: Self-pay | Admitting: Radiology

## 2018-06-14 MED ORDER — "INSULIN SYRINGE 31G X 5/16"" 1 ML MISC": 1 refills | Status: DC

## 2018-07-16 ENCOUNTER — Other Ambulatory Visit: Payer: Self-pay | Admitting: Internal Medicine

## 2018-07-20 MED ORDER — LOSARTAN POTASSIUM 50 MG PO TABS: ORAL_TABLET | ORAL | 1 refills | Status: DC

## 2018-07-31 ENCOUNTER — Other Ambulatory Visit: Payer: Self-pay | Admitting: Internal Medicine

## 2018-08-05 ENCOUNTER — Other Ambulatory Visit: Payer: Medicare Other

## 2018-08-08 ENCOUNTER — Ambulatory Visit: Payer: Medicare Other | Admitting: Internal Medicine

## 2018-08-09 ENCOUNTER — Other Ambulatory Visit: Payer: Self-pay

## 2018-08-09 ENCOUNTER — Other Ambulatory Visit: Payer: Medicare Other

## 2018-08-09 ENCOUNTER — Other Ambulatory Visit (INDEPENDENT_AMBULATORY_CARE_PROVIDER_SITE_OTHER): Payer: Medicare Other

## 2018-08-09 DIAGNOSIS — E119 Type 2 diabetes mellitus without complications: Secondary | ICD-10-CM | POA: Diagnosis not present

## 2018-08-09 LAB — COMPREHENSIVE METABOLIC PANEL
ALT: 20 U/L (ref 0–35)
AST: 32 U/L (ref 0–37)
Albumin: 4.5 g/dL (ref 3.5–5.2)
Alkaline Phosphatase: 112 U/L (ref 39–117)
BUN: 15 mg/dL (ref 6–23)
CO2: 28 mEq/L (ref 19–32)
Calcium: 9.7 mg/dL (ref 8.4–10.5)
Chloride: 101 mEq/L (ref 96–112)
Creatinine, Ser: 0.77 mg/dL (ref 0.40–1.20)
GFR: 73.8 mL/min (ref >60.00)
Glucose, Bld: 102 mg/dL — ABNORMAL HIGH (ref 70–99)
Potassium: 4.1 mEq/L (ref 3.5–5.1)
Sodium: 137 mEq/L (ref 135–145)
Total Bilirubin: 1 mg/dL (ref 0.2–1.2)
Total Protein: 7.3 g/dL (ref 6.0–8.3)

## 2018-08-09 LAB — HEMOGLOBIN A1C: Hgb A1c MFr Bld: 7.3 % — ABNORMAL HIGH (ref 4.6–6.5)

## 2018-08-12 ENCOUNTER — Other Ambulatory Visit: Payer: Self-pay

## 2018-08-12 ENCOUNTER — Encounter: Payer: Self-pay | Admitting: Internal Medicine

## 2018-08-12 ENCOUNTER — Ambulatory Visit: Payer: Medicare Other | Admitting: Internal Medicine

## 2018-08-12 DIAGNOSIS — E1169 Type 2 diabetes mellitus with other specified complication: Secondary | ICD-10-CM

## 2018-08-12 DIAGNOSIS — K760 Fatty (change of) liver, not elsewhere classified: Secondary | ICD-10-CM | POA: Diagnosis not present

## 2018-08-12 DIAGNOSIS — I10 Essential (primary) hypertension: Secondary | ICD-10-CM | POA: Diagnosis not present

## 2018-08-12 NOTE — Progress Notes (Signed)
Subjective:  Patient ID: [name], female    DOB: [birth year]  Age: 73 y.o. MRN: [id number]  CC: Diagnoses of Essential hypertension, Fatty liver disease, nonalcoholic, and Type 2 diabetes mellitus with other specified complication, without long-term current use of insulin ([place]) were pertinent to this visit.  HPI [name] presents for 3 month follow up on diabetes.  Patient has no complaints today.  Patient is following a low glycemic index diet and taking all prescribed medications regularly without side effects.  Fasting sugars have been under less than 130 most of the time and post prandials have been under 160 except on rare occasions. Patient is walking 2 to 4 miles daily and intentionally trying to lose weight .  Patient has had an eye exam in the last 12 months and checks feet regularly for signs of infection.  Patient does not walk barefoot outside,  And denies any numbness tingling or burning in feet. Patient is up to date on all recommended vaccinations  Hypertension: patient checks blood pressure twice weekly at home.  Readings have been for the most part < 140/80 at rest . Patient is following a reduced salt diet most days and is taking medications as prescribed  Lab Results  Component Value Date   HGBA1C 7.3 (H) 08/09/2018     Outpatient Medications Prior to Visit  Medication Sig Dispense Refill  . atorvastatin (LIPITOR) 80 MG tablet TAKE 1 TABLET (80 MG TOTAL) BY MOUTH DAILY. 90 tablet 1  . blood glucose meter kit and supplies KIT Dispense based on patient and insurance preference. Use up to four times daily as directed. (FOR ICD-9 250.00, 250.01). 1 each 0  . calcium-vitamin D (OSCAL WITH D) 250-125 MG-UNIT per tablet Take 2 tablets by mouth daily.    . famotidine (PEPCID) 20 MG tablet TAKE 1 TABLET (20 MG TOTAL) BY MOUTH DAILY. 30 MINUTES BEFORE DINNER 90 tablet 1  . folic acid (FOLVITE) 0.5 MG tablet Take 0.5 mg by mouth daily.    Marland Kitchen glipiZIDE (GLUCOTROL) 5 MG tablet  Take 1 tablet (5 mg total) by mouth 2 (two) times daily before a meal. 180 tablet 1  . glucose blood (ACCU-CHEK AVIVA PLUS) test strip Use to check blood sugar once daily Dx: E11.65 100 each 2  . Insulin Syringe-Needle U-100 (INSULIN SYRINGE 1CC/31GX5/16") 31G X 5/16" 1 ML MISC Use to inject insulin twice daily. 100 each 1  . losartan (COZAAR) 50 MG tablet TAKE 1 TABLET (50 MG TOTAL) BY MOUTH DAILY. 90 tablet 1  . methotrexate 50 MG/2ML injection INJECT 0.8 MLS SUBCUTANEOUSLY ONCE A WEEK  1  . Methotrexate, Anti-Rheumatic, (METHOTREXATE, PF, [place]) Inject 0.7 mLs into the skin once a week.     . Multiple Vitamin (MULTIVITAMIN) tablet Take 1 tablet by mouth daily.    . pantoprazole (PROTONIX) 40 MG tablet TAKE 1 TABLET BY MOUTH EVERY DAY 90 tablet 1  . Psyllium (METAMUCIL FIBER PO) Take by mouth.    . vitamin C (ASCORBIC ACID) 500 MG tablet Take 500 mg by mouth daily.    . vitamin E 100 UNIT capsule Take 100 Units by mouth daily.    Marland Kitchen aspirin 81 MG tablet Take 1 tablet (81 mg total) by mouth daily. (Patient not taking: Reported on 05/06/2018) 30 tablet 2  . fish oil-omega-3 fatty acids 1000 MG capsule Take 2 g by mouth 2 (two) times daily.       No facility-administered medications prior to visit.  Review of Systems;  Patient denies headache, fevers, malaise, unintentional weight loss, skin rash, eye pain, sinus congestion and sinus pain, sore throat, dysphagia,  hemoptysis , cough, dyspnea, wheezing, chest pain, palpitations, orthopnea, edema, abdominal pain, nausea, melena, diarrhea, constipation, flank pain, dysuria, hematuria, urinary  Frequency, nocturia, numbness, tingling, seizures,  Focal weakness, Loss of consciousness,  Tremor, insomnia, depression, anxiety, and suicidal ideation.      Objective:  BP 124/70 (BP Location: Left Arm, Patient Position: Sitting, Cuff Size: Normal)   Pulse 89   Temp (!) 97.4 F (36.3 C) (Oral)   Resp 15   Ht _0  (1.676 m)   Wt 179 lb 9.6 oz (81.5  kg)   SpO2 99%   BMI 28.99 kg/m   BP Readings from Last 3 Encounters:  08/12/18 124/70  05/06/18 128/80  05/05/18 (!) 158/93    Wt Readings from Last 3 Encounters:  08/12/18 179 lb 9.6 oz (81.5 kg)  05/06/18 180 lb 6.4 oz (81.8 kg)  05/05/18 179 lb (81.2 kg)    General appearance: alert, cooperative and appears stated age Ears: normal TM's and external ear canals both ears Throat: lips, mucosa, and tongue normal; teeth and gums normal Neck: no adenopathy, no carotid bruit, supple, symmetrical, trachea midline and thyroid not enlarged, symmetric, no tenderness/mass/nodules Back: symmetric, no curvature. ROM normal. No CVA tenderness. Lungs: clear to auscultation bilaterally Heart: regular rate and rhythm, S1, S2 normal, no murmur, click, rub or gallop Abdomen: soft, non-tender; bowel sounds normal; no masses,  no organomegaly Pulses: 2+ and symmetric Skin: Skin color, texture, turgor normal. No rashes or lesions Lymph nodes: Cervical, supraclavicular, and axillary nodes normal.  Lab Results  Component Value Date   HGBA1C 7.3 (H) 08/09/2018   HGBA1C 7.2 (H) 05/04/2018   HGBA1C 7.0 (H) 11/03/2017    Lab Results  Component Value Date   CREATININE 0.77 08/09/2018   CREATININE 0.76 05/04/2018   CREATININE 0.73 11/03/2017    Lab Results  Component Value Date   WBC 4.0 12/24/2016   HGB 14.0 12/24/2016   HCT 42.2 12/24/2016   PLT 225.0 12/24/2016   GLUCOSE 102 (H) 08/09/2018   CHOL 115 05/04/2018   TRIG 71.0 05/04/2018   HDL 48.40 05/04/2018   LDLDIRECT 81 07/29/2017   LDLCALC 52 05/04/2018   ALT 20 08/09/2018   AST 32 08/09/2018   NA 137 08/09/2018   K 4.1 08/09/2018   CL 101 08/09/2018   CREATININE 0.77 08/09/2018   BUN 15 08/09/2018   CO2 28 08/09/2018   TSH 2.34 12/24/2016   HGBA1C 7.3 (H) 08/09/2018   MICROALBUR <0.7 11/03/2017    No results found.  Assessment & Plan:   Problem List Items Addressed This Visit    Type 2 diabetes mellitus with other  specified complication ([place])    With hypertension , hyerlipidemia and fatty liver.  Stable  control with low GI diet and exercise .  hemoglobin A1c remains  close to goal.. Patient is up to date on  eye exam and foot exam was done today.  There is  no proteinuria on prior micro urinalysis .  Advised by Nehemiah Massed to disconinue  asa and continue atorvastatin.   Lab Results  Component Value Date   HGBA1C 7.3 (H) 08/09/2018  ' Lab Results  Component Value Date   MICROALBUR <0.7 11/03/2017         Hypertension    Well controlled on current regimen of losartan. I advised patient to take it at  night instead of morning,  as recent studies have shown a favorable effect on CAD and CVA rates in those who do . Renal function is stable, no changes today.  Lab Results  Component Value Date   CREATININE 0.77 08/09/2018   Lab Results  Component Value Date   NA 137 08/09/2018   K 4.1 08/09/2018   CL 101 08/09/2018   CO2 28 08/09/2018         Fatty liver disease, nonalcoholic    Secondary to obesity and diabetes/metabolic syndrome. I have addressed  BMI and recommended a low glycemic index diet utilizing smaller more frequent meals to increase metabolism.Her participation in regular  Exercise is ongoing  But limited by left shoulder pain .  Lab Results  Component Value Date   ALT 20 08/09/2018   AST 32 08/09/2018   ALKPHOS 112 08/09/2018   BILITOT 1.0 08/09/2018   Repeat  revaccination for Hep a and B complete         A total of 25 minutes of face to face time was spent with patient more than half of which was spent in counselling about the above mentioned conditions  and coordination of care  I have discontinued Loreen Freud. [place]'s fish oil-omega-3 fatty acids and aspirin. I am also having her maintain her vitamin C, vitamin E, multivitamin, calcium-vitamin D, blood glucose meter kit and supplies, (Methotrexate, Anti-Rheumatic, (METHOTREXATE, PF, )), Psyllium (METAMUCIL FIBER PO), folic  acid, glucose blood, atorvastatin, methotrexate, glipiZIDE, INSULIN SYRINGE 1CC/31GX5/16", pantoprazole, losartan, and famotidine.  No orders of the defined types were placed in this encounter.   Medications Discontinued During This Encounter  Medication Reason  . aspirin 81 MG tablet Patient has not taken in last 30 days  . fish oil-omega-3 fatty acids 1000 MG capsule Patient has not taken in last 30 days    Follow-up: Return in about 3 months (around 11/12/2018).   Crecencio Mc, MD

## 2018-08-12 NOTE — Patient Instructions (Addendum)
Your a1c is slightly up at 7.3  Check your blood sugars 2 hours after eating,  Pick a different meal and follow it for a week  Diabetes Mellitus and Exercise Exercising regularly is important for your overall health, especially when you have diabetes (diabetes mellitus). Exercising is not only about losing weight. It has many other health benefits, such as increasing muscle strength and bone density and reducing body fat and stress. This leads to improved fitness, flexibility, and endurance, all of which result in better overall health. Exercise has additional benefits for people with diabetes, including:  Reducing appetite.  Helping to lower and control blood glucose.  Lowering blood pressure.  Helping to control amounts of fatty substances (lipids) in the blood, such as cholesterol and triglycerides.  Helping the body to respond better to insulin (improving insulin sensitivity).  Reducing how much insulin the body needs.  Decreasing the risk for heart disease by: ? Lowering cholesterol and triglyceride levels. ? Increasing the levels of good cholesterol. ? Lowering blood glucose levels. What is my activity plan? Your health care provider or certified diabetes educator can help you make a plan for the type and frequency of exercise (activity plan) that works for you. Make sure that you:  Do at least 150 minutes of moderate-intensity or vigorous-intensity exercise each week. This could be brisk walking, biking, or water aerobics. ? Do stretching and strength exercises, such as yoga or weightlifting, at least 2 times a week. ? Spread out your activity over at least 3 days of the week.  Get some form of physical activity every day. ? Do not go more than 2 days in a row without some kind of physical activity. ? Avoid being inactive for more than 30 minutes at a time. Take frequent breaks to walk or stretch.  Choose a type of exercise or activity that you enjoy, and set realistic  goals.  Start slowly, and gradually increase the intensity of your exercise over time. What do I need to know about managing my diabetes?   Check your blood glucose before and after exercising. ? If your blood glucose is 240 mg/dL (13.3 mmol/L) or higher before you exercise, check your urine for ketones. If you have ketones in your urine, do not exercise until your blood glucose returns to normal. ? If your blood glucose is 100 mg/dL (5.6 mmol/L) or lower, eat a snack containing 15-20 grams of carbohydrate. Check your blood glucose 15 minutes after the snack to make sure that your level is above 100 mg/dL (5.6 mmol/L) before you start your exercise.  Know the symptoms of low blood glucose (hypoglycemia) and how to treat it. Your risk for hypoglycemia increases during and after exercise. Common symptoms of hypoglycemia can include: ? Hunger. ? Anxiety. ? Sweating and feeling clammy. ? Confusion. ? Dizziness or feeling light-headed. ? Increased heart rate or palpitations. ? Blurry vision. ? Tingling or numbness around the mouth, lips, or tongue. ? Tremors or shakes. ? Irritability.  Keep a rapid-acting carbohydrate snack available before, during, and after exercise to help prevent or treat hypoglycemia.  Avoid injecting insulin into areas of the body that are going to be exercised. For example, avoid injecting insulin into: ? The arms, when playing tennis. ? The legs, when jogging.  Keep records of your exercise habits. Doing this can help you and your health care provider adjust your diabetes management plan as needed. Write down: ? Food that you eat before and after you exercise. ? Blood  glucose levels before and after you exercise. ? The type and amount of exercise you have done. ? When your insulin is expected to peak, if you use insulin. Avoid exercising at times when your insulin is peaking.  When you start a new exercise or activity, work with your health care provider to make  sure the activity is safe for you, and to adjust your insulin, medicines, or food intake as needed.  Drink plenty of water while you exercise to prevent dehydration or heat stroke. Drink enough fluid to keep your urine clear or pale yellow. Summary  Exercising regularly is important for your overall health, especially when you have diabetes (diabetes mellitus).  Exercising has many health benefits, such as increasing muscle strength and bone density and reducing body fat and stress.  Your health care provider or certified diabetes educator can help you make a plan for the type and frequency of exercise (activity plan) that works for you.  When you start a new exercise or activity, work with your health care provider to make sure the activity is safe for you, and to adjust your insulin, medicines, or food intake as needed. This information is not intended to replace advice given to you by your health care provider. Make sure you discuss any questions you have with your health care provider. Document Released: 07/25/2003 Document Revised: 11/12/2016 Document Reviewed: 10/14/2015 Elsevier Interactive Patient Education  2019 [employer].

## 2018-08-14 NOTE — Assessment & Plan Note (Signed)
Secondary to obesity and diabetes/metabolic syndrome. I have addressed  BMI and recommended a low glycemic index diet utilizing smaller more frequent meals to increase metabolism.Her participation in regular  Exercise is ongoing  But limited by left shoulder pain .  Lab Results  Component Value Date   ALT 20 08/09/2018   AST 32 08/09/2018   ALKPHOS 112 08/09/2018   BILITOT 1.0 08/09/2018   Repeat  revaccination for Hep a and B complete

## 2018-08-14 NOTE — Assessment & Plan Note (Addendum)
Well controlled on current regimen of losartan. I advised patient to take it at night instead of morning,  as recent studies have shown a favorable effect on CAD and CVA rates in those who do . Renal function is stable, no changes today.  Lab Results  Component Value Date   CREATININE 0.77 08/09/2018   Lab Results  Component Value Date   NA 137 08/09/2018   K 4.1 08/09/2018   CL 101 08/09/2018   CO2 28 08/09/2018

## 2018-08-14 NOTE — Assessment & Plan Note (Addendum)
With hypertension , hyerlipidemia and fatty liver.  Stable  control .  Reviewed the merits of  low GI diet and regular daily exercise .  hemoglobin A1c remains  close to goal.. Patient is up to date on  eye exam and foot exam was done today.  There is  no proteinuria on prior micro urinalysis . Asa discontinued,   continue atorvastatin.   Lab Results  Component Value Date   HGBA1C 7.3 (H) 08/09/2018  ' Lab Results  Component Value Date   MICROALBUR <0.7 11/03/2017

## 2018-08-15 ENCOUNTER — Other Ambulatory Visit: Payer: Medicare Other

## 2018-09-08 ENCOUNTER — Other Ambulatory Visit: Payer: Self-pay | Admitting: Internal Medicine

## 2018-10-25 ENCOUNTER — Other Ambulatory Visit: Payer: Self-pay | Admitting: Internal Medicine

## 2018-10-25 DIAGNOSIS — E782 Mixed hyperlipidemia: Secondary | ICD-10-CM

## 2018-10-25 DIAGNOSIS — E1169 Type 2 diabetes mellitus with other specified complication: Secondary | ICD-10-CM

## 2018-10-25 DIAGNOSIS — Z79899 Other long term (current) drug therapy: Secondary | ICD-10-CM

## 2018-10-25 NOTE — Progress Notes (Signed)
.  comp

## 2018-11-11 ENCOUNTER — Other Ambulatory Visit (INDEPENDENT_AMBULATORY_CARE_PROVIDER_SITE_OTHER): Payer: Medicare Other

## 2018-11-11 ENCOUNTER — Other Ambulatory Visit: Payer: Self-pay

## 2018-11-11 DIAGNOSIS — E782 Mixed hyperlipidemia: Secondary | ICD-10-CM | POA: Diagnosis not present

## 2018-11-11 DIAGNOSIS — E1169 Type 2 diabetes mellitus with other specified complication: Secondary | ICD-10-CM | POA: Diagnosis not present

## 2018-11-11 DIAGNOSIS — Z79899 Other long term (current) drug therapy: Secondary | ICD-10-CM | POA: Diagnosis not present

## 2018-11-11 LAB — CBC WITH DIFFERENTIAL/PLATELET
Basophils Absolute: 0 10*3/uL (ref 0.0–0.1)
Basophils Relative: 0.8 % (ref 0.0–3.0)
Eosinophils Absolute: 0 10*3/uL (ref 0.0–0.7)
Eosinophils Relative: 1 % (ref 0.0–5.0)
HCT: 41.7 % (ref 36.0–46.0)
Hemoglobin: 13.6 g/dL (ref 12.0–15.0)
Lymphocytes Relative: 26.1 % (ref 12.0–46.0)
Lymphs Abs: 1.2 10*3/uL (ref 0.7–4.0)
MCHC: 32.6 g/dL (ref 30.0–36.0)
MCV: 95.3 fl (ref 78.0–100.0)
Monocytes Absolute: 0.3 10*3/uL (ref 0.1–1.0)
Monocytes Relative: 7 % (ref 3.0–12.0)
Neutro Abs: 3 10*3/uL (ref 1.4–7.7)
Neutrophils Relative %: 65.1 % (ref 43.0–77.0)
Platelets: 220 10*3/uL (ref 150.0–400.0)
RBC: 4.37 Mil/uL (ref 3.87–5.11)
RDW: 14.1 % (ref 11.5–15.5)
WBC: 4.6 10*3/uL (ref 4.0–10.5)

## 2018-11-11 LAB — COMPREHENSIVE METABOLIC PANEL
ALT: 14 U/L (ref 0–35)
AST: 23 U/L (ref 0–37)
Albumin: 4 g/dL (ref 3.5–5.2)
Alkaline Phosphatase: 104 U/L (ref 39–117)
BUN: 15 mg/dL (ref 6–23)
CO2: 29 mEq/L (ref 19–32)
Calcium: 8.8 mg/dL (ref 8.4–10.5)
Chloride: 103 mEq/L (ref 96–112)
Creatinine, Ser: 0.72 mg/dL (ref 0.40–1.20)
GFR: 79.69 mL/min (ref >60.00)
Glucose, Bld: 137 mg/dL — ABNORMAL HIGH (ref 70–99)
Potassium: 3.9 mEq/L (ref 3.5–5.1)
Sodium: 139 mEq/L (ref 135–145)
Total Bilirubin: 1.3 mg/dL — ABNORMAL HIGH (ref 0.2–1.2)
Total Protein: 6.3 g/dL (ref 6.0–8.3)

## 2018-11-11 LAB — LIPID PANEL
Cholesterol: 118 mg/dL (ref 0–200)
HDL: 48.8 mg/dL (ref >39.00)
LDL Cholesterol: 50 mg/dL (ref 0–99)
NonHDL: 69.06
Total CHOL/HDL Ratio: 2
Triglycerides: 95 mg/dL (ref 0.0–149.0)
VLDL: 19 mg/dL (ref 0.0–40.0)

## 2018-11-11 LAB — MICROALBUMIN / CREATININE URINE RATIO
Creatinine,U: 26 mg/dL
Microalb Creat Ratio: 2.7 mg/g (ref 0.0–30.0)
Microalb, Ur: <0.7 mg/dL (ref 0.0–1.9)

## 2018-11-11 LAB — HEMOGLOBIN A1C: Hgb A1c MFr Bld: 7.2 % — ABNORMAL HIGH (ref 4.6–6.5)

## 2018-11-17 ENCOUNTER — Other Ambulatory Visit: Payer: Self-pay

## 2018-11-17 ENCOUNTER — Ambulatory Visit (INDEPENDENT_AMBULATORY_CARE_PROVIDER_SITE_OTHER): Payer: Medicare Other | Admitting: Internal Medicine

## 2018-11-17 ENCOUNTER — Encounter: Payer: Self-pay | Admitting: Internal Medicine

## 2018-11-17 VITALS — Wt 171.7 lb

## 2018-11-17 DIAGNOSIS — K760 Fatty (change of) liver, not elsewhere classified: Secondary | ICD-10-CM

## 2018-11-17 DIAGNOSIS — Z8709 Personal history of other diseases of the respiratory system: Secondary | ICD-10-CM

## 2018-11-17 DIAGNOSIS — Z7189 Other specified counseling: Secondary | ICD-10-CM

## 2018-11-17 DIAGNOSIS — M05719 Rheumatoid arthritis with rheumatoid factor of unspecified shoulder without organ or systems involvement: Secondary | ICD-10-CM

## 2018-11-17 DIAGNOSIS — Z8619 Personal history of other infectious and parasitic diseases: Secondary | ICD-10-CM

## 2018-11-17 DIAGNOSIS — E1169 Type 2 diabetes mellitus with other specified complication: Secondary | ICD-10-CM | POA: Diagnosis not present

## 2018-11-17 NOTE — Patient Instructions (Signed)
Change losartan to evening   Check bp one daily in the  Morning   Suspend glipizide morning dose for 4-5 days  Check blood sugars after your walk

## 2018-11-17 NOTE — Progress Notes (Signed)
Telephone  Note  This visit type was conducted due to national recommendations for restrictions regarding the COVID-19 pandemic (e.g. social distancing).  This format is felt to be most appropriate for this patient at this time.  All issues noted in this document were discussed and addressed.  No physical exam was performed (except for noted visual exam findings with Video Visits).   I connected with@ on 11/17/18 at 10:30 AM EDT by  telephone and verified that I am speaking with the correct person using two identifiers. Location patient: home Location provider: work or home office Persons participating in the virtual visit: patient, provider  I discussed the limitations, risks, security and privacy concerns of performing an evaluation and management service by telephone and the availability of in person appointments. I also discussed with the patient that there may be a patient responsible charge related to this service. The patient expressed understanding and agreed to proceed.  Reason for visit   Type 2 DM  follow up  HPI:  The patient has no signs or symptoms of COVID 19 infection (fever, cough, sore throat  or shortness of breath beyond what is typical for patient).  Patient denies contact with other persons with the above mentioned symptoms or with anyone confirmed to have COVID 19  She and husband had a prolonged  Viral illness in February with profound fatigue  And she feels certain that she and he both had the CORONAVIRUS.  She is requesting testing.  3 month follow up on diabetes.  Patient has no complaints today.  Patient is following a low glycemic index diet and taking all prescribed medications regularly without side effects.  Fasting sugars have been under less than 140 most of the time and post prandials have been under 160 except on rare occasions. Patient is exercising about 3 times per week and intentionally trying to lose weight .  Patient has had an eye exam in the last 12  months and checks feet regularly for signs of infection.  Patient does not walk barefoot outside,  And denies an numbness tingling or burning in feet. Patient is up to date on all recommended vaccinations   Mammogram normal [facility] jan 2020  Colonoscopy normal 2015  ROS: Patient denies headache, fevers, malaise, unintentional weight loss, skin rash, eye pain, sinus congestion and sinus pain, sore throat, dysphagia,  hemoptysis , cough, dyspnea, wheezing, chest pain, palpitations, orthopnea, edema, abdominal pain, nausea, melena, diarrhea, constipation, flank pain, dysuria, hematuria, urinary  Frequency, nocturia, numbness, tingling, seizures,  Focal weakness, Loss of consciousness,  Tremor, insomnia, depression, anxiety, and suicidal ideation.      Past Medical History:  Diagnosis Date  . Diabetes mellitus   . Gastritis   . High cholesterol   . Hyperlipidemia    Lipitor trial was a failure  . Hypertension     Past Surgical History:  Procedure Laterality Date  . CARDIAC CATHETERIZATION  Augus 2007   normal coronaries,  EF 60%  . [place]  . SPINE SURGERY     bone graft   . TUBAL LIGATION      Family History  Problem Relation Age of Onset  . Diabetes Mother   . Heart disease Father   . Stroke Father   . Diabetes Father   . Diabetes Maternal Grandfather   . Diabetes Brother   . Diabetes Brother   . Breast cancer Neg Hx     SOCIAL HX:  reports that she has never smoked. She  has never used smokeless tobacco. She reports that she does not drink alcohol or use drugs.   Current Outpatient Medications:  .  ACCU-CHEK AVIVA PLUS test strip, USE TO CHECK BLOOD SUGAR ONCE DAILY DX: E11.65, Disp: 100 each, Rfl: 2 .  atorvastatin (LIPITOR) 80 MG tablet, TAKE 1 TABLET (80 MG TOTAL) BY MOUTH DAILY., Disp: 90 tablet, Rfl: 1 .  blood glucose meter kit and supplies KIT, Dispense based on patient and insurance preference. Use up to four times daily as directed. (FOR ICD-9 250.00,  250.01)., Disp: 1 each, Rfl: 0 .  calcium-vitamin D (OSCAL WITH D) 250-125 MG-UNIT per tablet, Take 2 tablets by mouth daily., Disp: , Rfl:  .  famotidine (PEPCID) 20 MG tablet, TAKE 1 TABLET (20 MG TOTAL) BY MOUTH DAILY. [place], Disp: 90 tablet, Rfl: 1 .  folic acid (FOLVITE) 0.5 MG tablet, Take 0.5 mg by mouth daily., Disp: , Rfl:  .  glipiZIDE (GLUCOTROL) 5 MG tablet, Take 1 tablet (5 mg total) by mouth 2 (two) times daily before a meal., Disp: 180 tablet, Rfl: 1 .  Insulin Syringe-Needle U-100 (INSULIN SYRINGE 1CC/31GX5/16") 31G X 5/16" 1 ML MISC, Use to inject insulin twice daily., Disp: 100 each, Rfl: 1 .  losartan (COZAAR) 50 MG tablet, TAKE 1 TABLET (50 MG TOTAL) BY MOUTH DAILY., Disp: 90 tablet, Rfl: 1 .  methotrexate 50 MG/2ML injection, INJECT 0.8 MLS SUBCUTANEOUSLY ONCE A WEEK, Disp: , Rfl: 1 .  Methotrexate, Anti-Rheumatic, (METHOTREXATE, PF, [place]), Inject 0.7 mLs into the skin once a week. , Disp: , Rfl:  .  Multiple Vitamin (MULTIVITAMIN) tablet, Take 1 tablet by mouth daily., Disp: , Rfl:  .  pantoprazole (PROTONIX) 40 MG tablet, TAKE 1 TABLET BY MOUTH EVERY DAY, Disp: 90 tablet, Rfl: 1 .  Psyllium (METAMUCIL FIBER PO), Take by mouth., Disp: , Rfl:  .  vitamin C (ASCORBIC ACID) 500 MG tablet, Take 500 mg by mouth daily., Disp: , Rfl:   EXAM:   General impression: alert, cooperative and articulate.  No signs of being in distress  Lungs: speech is fluent sentence length suggests that patient is not short of breath and not punctuated by cough, sneezing or sniffing. Marland Kitchen   Psych: affect normal.  speech is articulate and non pressured .  Denies suicidal thoughts   ASSESSMENT AND PLAN:   General impression: alert, cooperative and articulate.  No signs of being in distress  Lungs: speech is fluent sentence length suggests that patient is not short of breath and not punctuated by cough, sneezing or sniffing. Marland Kitchen   Psych: affect normal.  speech is articulate and non  pressured .  Denies suicidal thoughts   Discussed the following assessment and plan: History of respiratory tract infection Testing for COVID 19 antibody requested and ordered given history of prolonged respiratory illness in February .the limitations of the test were explained,  Namely that she  cannot necessarily draw the conclusion that  if the COVID Antibody it is positive,  that she is immune to subsequent infection   Educated About Covid-19 Virus Infection Educated patient on the newly broadened list of signs and symptoms of COVID-19 infection and ways to avoid the viral infection including washing hands frequently with soap and water,  using hand sanitizer if unable to wash, avoiding touching face,  staying at home and limiting visitors,  and avoiding contact with people coming in and out of home.  Discussed the potential ineffectiveness of hand sanitizer if left in  environments > 110 degrees (ie , the car).  Reminded patient to call office with questions/concerns.  The importance of continued social distancing was discussed today . Patient was screened for the development of any unsafe behaviors or habits that may have developed as a result of the social impact of the virus , including alcohol abuse,  Domestic violence, tobacco abuse and overeating.     Fatty liver disease, nonalcoholic Secondary to obesity and diabetes/metabolic syndrome. I have encouraged lowering of   BMI and adherence  To a low glycemic index diet utilizing smaller more frequent meals to increase metabolism.  She is walking 3 miles daily  Lab Results  Component Value Date   ALT 14 11/11/2018   AST 23 11/11/2018   ALKPHOS 104 11/11/2018   BILITOT 1.3 (H) 11/11/2018   Repeat  revaccination for Hep a and B complete   Type 2 diabetes mellitus with other specified complication ([place]) With hypertension , hyerlipidemia and fatty liver.  REcent labs noted Stable  control .  Reviewed the merits of  low GI diet and regular  daily exercise .  hemoglobin A1c remains  close to goal.. Patient is up to date on  eye exam and foot exam was done today.  There is  no proteinuria on r micro urinalysis . Asa discontinued,   continue atorvastatin.   Lab Results  Component Value Date   HGBA1C 7.2 (H) 11/11/2018  ' Lab Results  Component Value Date   MICROALBUR <0.7 11/11/2018       I discussed the assessment and treatment plan with the patient. The patient was provided an opportunity to ask questions and all were answered. The patient agreed with the plan and demonstrated an understanding of the instructions.   The patient was advised to call back or seek an in-person evaluation if the symptoms worsen or if the condition fails to improve as anticipated.  I provided 25 minutes of non-face-to-face time during this encounter.   Crecencio Mc, MD

## 2018-11-19 DIAGNOSIS — Z7189 Other specified counseling: Secondary | ICD-10-CM | POA: Insufficient documentation

## 2018-11-19 DIAGNOSIS — Z8709 Personal history of other diseases of the respiratory system: Secondary | ICD-10-CM | POA: Insufficient documentation

## 2018-11-19 NOTE — Assessment & Plan Note (Signed)
Testing for COVID 19 antibody requested and ordered given history of prolonged respiratory illness in February .the limitations of the test were explained,  Namely that she  cannot necessarily draw the conclusion that  if the COVID Antibody it is positive,  that she is immune to subsequent infection

## 2018-11-19 NOTE — Assessment & Plan Note (Signed)
Educated patient on the newly broadened list of signs and symptoms of COVID-19 infection and ways to avoid the viral infection including washing hands frequently with soap and water,  using hand sanitizer if unable to wash, avoiding touching face,  staying at home and limiting visitors,  and avoiding contact with people coming in and out of home.  Discussed the potential ineffectiveness of hand sanitizer if left in environments > 110 degrees (ie , the car).  Reminded patient to call office with questions/concerns.  The importance of continued social distancing was discussed today . Patient was screened for the development of any unsafe behaviors or habits that may have developed as a result of the social impact of the virus , including alcohol abuse,  Domestic violence, tobacco abuse and overeating.

## 2018-11-19 NOTE — Assessment & Plan Note (Addendum)
With hypertension , hyerlipidemia and fatty liver.  REcent labs noted Stable  control .  Reviewed the merits of  low GI diet and regular daily exercise .  hemoglobin A1c remains  close to goal.. Patient is up to date on  eye exam and foot exam was done today.  There is  no proteinuria on r micro urinalysis . Asa discontinued,   continue atorvastatin.   Lab Results  Component Value Date   HGBA1C 7.2 (H) 11/11/2018  ' Lab Results  Component Value Date   MICROALBUR <0.7 11/11/2018

## 2018-11-19 NOTE — Assessment & Plan Note (Signed)
Secondary to obesity and diabetes/metabolic syndrome. I have encouraged lowering of   BMI and adherence  To a low glycemic index diet utilizing smaller more frequent meals to increase metabolism.  She is walking 3 miles daily  Lab Results  Component Value Date   ALT 14 11/11/2018   AST 23 11/11/2018   ALKPHOS 104 11/11/2018   BILITOT 1.3 (H) 11/11/2018   Repeat  revaccination for Hep a and B complete

## 2018-11-27 ENCOUNTER — Other Ambulatory Visit: Payer: Self-pay | Admitting: Internal Medicine

## 2018-11-27 NOTE — Progress Notes (Signed)
MyChart message sent .  Advised to reduce use of glipizide in the morning

## 2018-12-05 ENCOUNTER — Encounter: Payer: Self-pay | Admitting: Podiatry

## 2018-12-05 ENCOUNTER — Ambulatory Visit: Payer: Medicare Other | Admitting: Podiatry

## 2018-12-05 ENCOUNTER — Other Ambulatory Visit: Payer: Self-pay

## 2018-12-05 DIAGNOSIS — B351 Tinea unguium: Secondary | ICD-10-CM

## 2018-12-05 DIAGNOSIS — M79674 Pain in right toe(s): Secondary | ICD-10-CM

## 2018-12-05 DIAGNOSIS — Q828 Other specified congenital malformations of skin: Secondary | ICD-10-CM | POA: Diagnosis not present

## 2018-12-05 DIAGNOSIS — E119 Type 2 diabetes mellitus without complications: Secondary | ICD-10-CM | POA: Diagnosis not present

## 2018-12-05 NOTE — Progress Notes (Signed)
She presents today chief complaint of painfully elongated toenails bilaterally.  Objective: Toenails are thick yellow dystrophic onychomycotic.  Pulses are palpable no open lesions or wounds.  Assessment: Pain in limb secondary to onychomycosis.  Plan: Debridement of toenails 1 through 5 bilateral.

## 2018-12-08 ENCOUNTER — Other Ambulatory Visit: Payer: Self-pay | Admitting: Internal Medicine

## 2019-01-05 ENCOUNTER — Other Ambulatory Visit: Payer: Self-pay

## 2019-01-05 ENCOUNTER — Emergency Department: Payer: No Typology Code available for payment source

## 2019-01-05 ENCOUNTER — Emergency Department
Admission: EM | Admit: 2019-01-05 | Discharge: 2019-01-05 | Disposition: A | Discharge status: Home / Self Care | Payer: No Typology Code available for payment source | Attending: Emergency Medicine | Admitting: Emergency Medicine

## 2019-01-05 ENCOUNTER — Encounter: Payer: Self-pay | Admitting: Emergency Medicine

## 2019-01-05 ENCOUNTER — Telehealth: Payer: Self-pay | Admitting: Internal Medicine

## 2019-01-05 DIAGNOSIS — Z041 Encounter for examination and observation following transport accident: Secondary | ICD-10-CM | POA: Insufficient documentation

## 2019-01-05 DIAGNOSIS — I10 Essential (primary) hypertension: Secondary | ICD-10-CM | POA: Diagnosis not present

## 2019-01-05 DIAGNOSIS — E119 Type 2 diabetes mellitus without complications: Secondary | ICD-10-CM | POA: Insufficient documentation

## 2019-01-05 DIAGNOSIS — R413 Other amnesia: Secondary | ICD-10-CM | POA: Diagnosis present

## 2019-01-05 DIAGNOSIS — M069 Rheumatoid arthritis, unspecified: Secondary | ICD-10-CM | POA: Insufficient documentation

## 2019-01-05 DIAGNOSIS — Z91040 Latex allergy status: Secondary | ICD-10-CM | POA: Insufficient documentation

## 2019-01-05 DIAGNOSIS — Z880 Allergy status to penicillin: Secondary | ICD-10-CM | POA: Insufficient documentation

## 2019-01-05 DIAGNOSIS — M25511 Pain in right shoulder: Secondary | ICD-10-CM | POA: Diagnosis not present

## 2019-01-05 DIAGNOSIS — E785 Hyperlipidemia, unspecified: Secondary | ICD-10-CM | POA: Diagnosis not present

## 2019-01-05 LAB — BASIC METABOLIC PANEL
Anion gap: 8 (ref 5–15)
BUN: 16 mg/dL (ref 8–23)
CO2: 25 mmol/L (ref 22–32)
Calcium: 9.2 mg/dL (ref 8.9–10.3)
Chloride: 101 mmol/L (ref 98–111)
Creatinine, Ser: 0.63 mg/dL (ref 0.44–1.00)
GFR calc Af Amer: >60 mL/min (ref >60)
GFR calc non Af Amer: >60 mL/min (ref >60)
Glucose, Bld: 275 mg/dL — ABNORMAL HIGH (ref 70–99)
Potassium: 3.9 mmol/L (ref 3.5–5.1)
Sodium: 134 mmol/L — ABNORMAL LOW (ref 135–145)

## 2019-01-05 LAB — CBC
HCT: 43.1 % (ref 36.0–46.0)
Hemoglobin: 14.4 g/dL (ref 12.0–15.0)
MCH: 30.9 pg (ref 26.0–34.0)
MCHC: 33.4 g/dL (ref 30.0–36.0)
MCV: 92.5 fL (ref 80.0–100.0)
Platelets: 213 10*3/uL (ref 150–400)
RBC: 4.66 MIL/uL (ref 3.87–5.11)
RDW: 13.8 % (ref 11.5–15.5)
WBC: 6.5 10*3/uL (ref 4.0–10.5)
nRBC: 0 % (ref 0.0–0.2)

## 2019-01-05 LAB — URINALYSIS, COMPLETE (UACMP) WITH MICROSCOPIC
Bacteria, UA: NONE SEEN
Bilirubin Urine: NEGATIVE
Glucose, UA: >=500 mg/dL — AB
Hgb urine dipstick: NEGATIVE
Ketones, ur: NEGATIVE mg/dL
Leukocytes,Ua: NEGATIVE
Nitrite: NEGATIVE
Protein, ur: NEGATIVE mg/dL
Specific Gravity, Urine: 1.002 — ABNORMAL LOW (ref 1.005–1.030)
pH: 6 (ref 5.0–8.0)
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
## 2019-01-05 NOTE — ED Notes (Signed)
Returned from CT awaiting results and plan of care.
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
## 2019-01-06 ENCOUNTER — Other Ambulatory Visit: Payer: Self-pay | Admitting: Internal Medicine

## 2019-01-11 ENCOUNTER — Other Ambulatory Visit: Payer: Self-pay | Admitting: Internal Medicine

## 2019-01-13 ENCOUNTER — Ambulatory Visit (INDEPENDENT_AMBULATORY_CARE_PROVIDER_SITE_OTHER): Payer: Medicare Other | Admitting: Internal Medicine

## 2019-01-13 ENCOUNTER — Other Ambulatory Visit: Payer: Self-pay

## 2019-01-13 ENCOUNTER — Encounter: Payer: Self-pay | Admitting: Internal Medicine

## 2019-01-13 DIAGNOSIS — R55 Syncope and collapse: Secondary | ICD-10-CM | POA: Diagnosis not present

## 2019-01-13 MED ORDER — LOSARTAN POTASSIUM 25 MG PO TABS: 50.0000 mg | ORAL_TABLET | Freq: Every day | ORAL | 1 refills | Status: DC
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
## 2019-01-15 DIAGNOSIS — R55 Syncope and collapse: Secondary | ICD-10-CM | POA: Insufficient documentation

## 2019-01-15 HISTORY — DX: Syncope and collapse: R55
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
## 2019-01-20 ENCOUNTER — Telehealth: Payer: Self-pay | Admitting: *Deleted
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
## 2019-05-01 ENCOUNTER — Other Ambulatory Visit: Payer: Self-pay

## 2019-05-01 ENCOUNTER — Ambulatory Visit (INDEPENDENT_AMBULATORY_CARE_PROVIDER_SITE_OTHER): Payer: Medicare Other

## 2019-05-01 DIAGNOSIS — Z0000 Encounter for general adult medical examination without abnormal findings: Secondary | ICD-10-CM

## 2019-05-01 NOTE — Progress Notes (Signed)
Subjective:   [name] is a 73 y.o. female who presents for Medicare Annual (Subsequent) preventive examination.  Review of Systems:  No ROS.  Medicare Wellness Virtual Visit.  Visual/audio telehealth visit, UTA vital signs.   See social history for additional risk factors.   Cardiac Risk Factors include: advanced age (>65mn, >>65women);hypertension;diabetes mellitus     Objective:     Vitals: There were no vitals taken for this visit.  There is no height or weight on file to calculate BMI.  Advanced Directives 05/01/2019 04/28/2018 04/13/2017 01/01/2016 04/01/2015 11/05/2014 09/12/2014  Does Patient Have a Medical Advance Directive? No No No No No No Yes  Type of Advance Directive - - - - - - Living will  Does patient want to make changes to medical advance directive? - - - - - - Yes - information given  Would patient like information on creating a medical advance directive? No - Guardian declined No - Patient declined No - Patient declined No - patient declined information Yes - EScientist, clinical (histocompatibility and immunogenetics)given Yes - EScientist, clinical (histocompatibility and immunogenetics)given Yes - EScientist, clinical (histocompatibility and immunogenetics)given    Tobacco Social History   Tobacco Use  Smoking Status Never Smoker  Smokeless Tobacco Never Used     Counseling given: Not Answered   Clinical Intake:  Pre-visit preparation completed: Yes        Diabetes: Yes(Followed by pcp) CBG done?: No Did pt. bring in CBG monitor from home?: No  How often do you need to have someone help you when you read instructions, pamphlets, or other written materials from your doctor or pharmacy?: 1 - Never  Interpreter Needed?: No     Past Medical History:  Diagnosis Date  . Diabetes mellitus   . Gastritis   . High cholesterol   . Hyperlipidemia    Lipitor trial was a failure  . Hypertension    Past Surgical History:  Procedure Laterality Date  . CARDIAC CATHETERIZATION  Augus 2007   normal coronaries,  EF 60%  [place]     bone graft   . TUBAL LIGATION     Family History  Problem Relation Age of Onset  . Diabetes Mother   . Heart disease Father   . Stroke Father   . Diabetes Father   . Diabetes Maternal Grandfather   . Diabetes Brother   . Diabetes Brother   . Breast cancer Neg Hx    Social History   Socioeconomic History  . Marital status: Married    Spouse name: Not on file  . Number of children: Not on file  . Years of education: Not on file  . Highest education level: Not on file  Occupational History  . Occupation: RProgrammer, [employer]    Comment: Retired 2014  Tobacco Use  . Smoking status: Never Smoker  . Smokeless tobacco: Never Used  Substance and Sexual Activity  . Alcohol use: No  . Drug use: No  . Sexual activity: Yes    Partners: Male    Birth control/protection: None  Other Topics Concern  . Not on file  Social History Narrative  . Not on file   Social Determinants of Health   Financial Resource Strain:   . Difficulty of Paying Living Expenses: Not on file  Food Insecurity:   . Worried About RCharity fundraiserin the Last Year: Not on file  . Ran Out of Food in  the Last Year: Not on file  Transportation Needs:   . Lack of Transportation (Medical): Not on file  . Lack of Transportation (Non-Medical): Not on file  Physical Activity: Sufficiently Active  . Days of Exercise per Week: 3 days  . Minutes of Exercise per Session: 60 min  Stress:   . Feeling of Stress : Not on file  Social Connections:   . Frequency of Communication with Friends and Family: Not on file  . Frequency of Social Gatherings with Friends and Family: Not on file  . Attends Religious Services: Not on file  . Active Member of Clubs or Organizations: Not on file  . Attends Archivist Meetings: Not on file  . Marital Status: Not on file    Outpatient Encounter Medications as of 05/01/2019  Medication Sig  . ACCU-CHEK AVIVA PLUS test strip USE TO CHECK BLOOD SUGAR  ONCE DAILY DX: E11.65  . atorvastatin (LIPITOR) 80 MG tablet TAKE 1 TABLET (80 MG TOTAL) BY MOUTH DAILY.  . blood glucose meter kit and supplies KIT Dispense based on patient and insurance preference. Use up to four times daily as directed. (FOR ICD-9 250.00, 250.01).  . calcium-vitamin D (OSCAL WITH D) 250-125 MG-UNIT per tablet Take 2 tablets by mouth daily.  . famotidine (PEPCID) 20 MG tablet TAKE 1 TABLET (20 MG TOTAL) BY MOUTH DAILY. [place] folic acid (FOLVITE) 0.5 MG tablet Take 0.5 mg by mouth daily.  Marland Kitchen glipiZIDE (GLUCOTROL) 5 MG tablet TAKE 1 TABLET (5 MG TOTAL) BY MOUTH 2 (TWO) TIMES DAILY BEFORE A MEAL.  Marland Kitchen Insulin Syringe-Needle U-100 (INSULIN SYRINGE 1CC/31GX5/16") 31G X 5/16" 1 ML MISC Use to inject insulin twice daily.  Marland Kitchen losartan (COZAAR) 25 MG tablet Take 2 tablets (50 mg total) by mouth daily.  . methotrexate 50 MG/2ML injection INJECT 0.8 MLS SUBCUTANEOUSLY ONCE A WEEK  . Methotrexate, Anti-Rheumatic, (METHOTREXATE, PF, [place]) Inject 0.7 mLs into the skin once a week.   . Multiple Vitamin (MULTIVITAMIN) tablet Take 1 tablet by mouth daily.  . pantoprazole (PROTONIX) 40 MG tablet TAKE 1 TABLET BY MOUTH EVERY DAY  . Psyllium (METAMUCIL FIBER PO) Take by mouth.  . vitamin C (ASCORBIC ACID) 500 MG tablet Take 500 mg by mouth daily.  . [DISCONTINUED] ferrous sulfate 325 (65 FE) MG tablet Take 325 mg by mouth daily with breakfast.    . [DISCONTINUED] omeprazole (PRILOSEC) 40 MG capsule Take 1 capsule (40 mg total) by mouth daily.   No facility-administered encounter medications on file as of 05/01/2019.    Activities of Daily Living In your present state of health, do you have any difficulty performing the following activities: 05/01/2019  Hearing? N  Vision? N  Difficulty concentrating or making decisions? N  Walking or climbing stairs? N  Dressing or bathing? N  Doing errands, shopping? N  Preparing Food and eating ? N  Using the Toilet? N  In the past  six months, have you accidently leaked urine? N  Do you have problems with loss of bowel control? N  Managing your Medications? N  Managing your Finances? N  Housekeeping or managing your Housekeeping? N  Some recent data might be hidden    Patient Care Team: Crecencio Mc, MD as PCP - General (Internal Medicine) Emmaline Kluver., MD (Rheumatology) Osborne Oman, MD as Consulting Physician (Obstetrics and Gynecology)    Assessment:   This is a routine wellness examination for Beva.  Nurse connected with  patient 05/01/19 at 11:00 AM EST by a telephone enabled telemedicine application and verified that I am speaking with the correct person using two identifiers. Patient stated full name and DOB. Patient gave permission to continue with virtual visit. Patient's location was at home and Nurse's location was at [place] office.   Patient is alert and oriented x3. Patient denies difficulty focusing or concentrating. Notes some difficulty remembering names.  Patient likes to read, watching jeopardy/game shows, word search puzzles and writes letters/cards to friends for brain stimulation.   Health Maintenance Due: -Eye Exam- scheduled 05/30/19 -Hgb A1c- 11/11/18 (7.2) See completed HM at the end of note.   Eye: Visual acuity not assessed. Virtual visit. Followed by their ophthalmologist. Retinopathy- none reported  Dental: Visits every 6 months.    Hearing: Demonstrates normal hearing during visit.  Safety:  Patient feels safe at home- yes Patient does have smoke detectors at home- yes Patient does wear sunscreen or protective clothing when in direct sunlight - yes Patient does wear seat belt when in a moving vehicle - yes Patient drives- yes Adequate lighting in walkways free from debris- yes Grab bars and handrails used as appropriate- yes Ambulates with an assistive device- no Cell phone on person when ambulating outside of the home- yes  Social: Alcohol intake -  no     Smoking history- never   Smokers in home? none Illicit drug use? none  Medication: Taking as directed and without issues.  Pill box in use -yes Self managed - yes   Covid-19: Precautions and sickness symptoms discussed. Wears mask, social distancing, hand hygiene as appropriate.   Activities of Daily Living Patient denies needing assistance with: household chores, feeding themselves, getting from bed to chair, getting to the toilet, bathing/showering, dressing, managing money, or preparing meals.   Discussed the importance of a healthy diet, water intake and the benefits of aerobic exercise. Physical activity- walking daily about 90 minutes. Notes her blood sugars are better with walking.   Diet:  Low carb  Water: good intake Caffeine: none  Other Providers Patient Care Team: Crecencio Mc, MD as PCP - General (Internal Medicine) Emmaline Kluver., MD (Rheumatology) Osborne Oman, MD as Consulting Physician (Obstetrics and Gynecology)  Exercise Activities and Dietary recommendations Current Exercise Habits: Home exercise routine, Type of exercise: walking, Time (Minutes): > 60, Frequency (Times/Week): 7, Weekly Exercise (Minutes/Week): 0, Intensity: Mild  Goals    . Peak Blood Glucose < 180       Fall Risk Fall Risk  05/01/2019 01/13/2019 04/28/2018 04/13/2017 03/12/2017  Falls in the past year? 0 0 0 No No  Follow up Falls prevention discussed Falls evaluation completed - - -   Timed Get Up and Go performed: no, virtual visit  Depression Screen PHQ 2/9 Scores 05/01/2019 04/28/2018 04/13/2017 03/12/2017  PHQ - 2 Score 0 0 0 3  PHQ- 9 Score - - 0 10     Cognitive Function MMSE - Mini Mental State Exam 04/13/2017 01/01/2016 09/12/2014  Orientation to time 5 5 5   Orientation to Place 5 5 5   Registration 3 3 3   Attention/ Calculation 5 5 5   Recall 2 3 3   Language- name 2 objects 2 2 2   Language- repeat 1 1 1   Language- follow 3 step command 3 3 3    Language- read & follow direction 1 1 1   Write a sentence 1 1 1   Copy design 1 1 1   Total score 29 30 30  6CIT Screen 05/01/2019 04/28/2018  What Year? 0 points 0 points  What month? 0 points 0 points  What time? 0 points 0 points  Count back from 20 0 points 0 points  Months in reverse 0 points 0 points  Repeat phrase 0 points -  Total Score 0 -    Immunization History  Administered Date(s) Administered  . Hep A / Hep B 01/01/2016, 02/04/2016, 07/03/2016  . Influenza Split 03/03/2012, 01/26/2013, 02/25/2015  . Influenza, High Dose Seasonal PF 02/20/2016, 01/31/2018, 01/08/2019  . Influenza,inj,Quad PF,6+ Mos 02/13/2014  . Influenza-Unspecified 02/19/2017  . Pneumococcal Conjugate-13 11/08/2013, 01/08/2019  . Pneumococcal Polysaccharide-23 03/03/2012  . Tdap 08/08/2013  . Zoster 04/20/2008  . Zoster Recombinat (Shingrix) 03/02/2018, 06/13/2018   Screening Tests Health Maintenance  Topic Date Due  . MAMMOGRAM  06/02/2018  . OPHTHALMOLOGY EXAM  01/11/2019  . FOOT EXAM  05/07/2019  . HEMOGLOBIN A1C  05/13/2019  . TETANUS/TDAP  08/09/2023  . COLONOSCOPY  02/03/2024  . INFLUENZA VACCINE  Completed  . DEXA SCAN  Completed  . Hepatitis C Screening  Completed  . PNA vac Low Risk Adult  Completed       Plan:    Keep all routine maintenance appointments.   Next scheduled lab 05/22/19 @ 830  Follow up 05/24/19 @ 10:00  Medicare Attestation I have personally reviewed: The patient's medical and social history Their use of alcohol, tobacco or illicit drugs Their current medications and supplements The patient's functional ability including ADLs,fall risks, home safety risks, cognitive, and hearing and visual impairment Diet and physical activities Evidence for depression   I have reviewed and discussed with patient certain preventive protocols, quality metrics, and best practice recommendations.     Varney Biles, LPN  [id number]/27/[id number]

## 2019-05-01 NOTE — Patient Instructions (Addendum)
  Ms. Szymborski , Thank you for taking time to come for your Medicare Wellness Visit. I appreciate your ongoing commitment to your health goals. Please review the following plan we discussed and let me know if I can assist you in the future.   These are the goals we discussed: Goals    . Peak Blood Glucose < 180       This is a list of the screening recommended for you and due dates:  Health Maintenance  Topic Date Due  . Mammogram  06/02/2018  . Eye exam for diabetics  01/11/2019  . Complete foot exam   05/07/2019  . Hemoglobin A1C  05/13/2019  . Tetanus Vaccine  08/09/2023  . Colon Cancer Screening  02/03/2024  . Flu Shot  Completed  . DEXA scan (bone density measurement)  Completed  .  Hepatitis C: One time screening is recommended by Center for Disease Control  (CDC) for  adults born from [birth year] through 1964.   Completed  . Pneumonia vaccines  Completed

## 2019-05-02 ENCOUNTER — Other Ambulatory Visit: Payer: Self-pay | Admitting: Internal Medicine

## 2019-05-04 ENCOUNTER — Other Ambulatory Visit: Payer: Self-pay | Admitting: Internal Medicine

## 2019-05-16 ENCOUNTER — Other Ambulatory Visit: Payer: Self-pay

## 2019-05-22 ENCOUNTER — Other Ambulatory Visit: Payer: Self-pay

## 2019-05-22 ENCOUNTER — Other Ambulatory Visit (INDEPENDENT_AMBULATORY_CARE_PROVIDER_SITE_OTHER): Payer: Medicare PPO

## 2019-05-22 DIAGNOSIS — E1169 Type 2 diabetes mellitus with other specified complication: Secondary | ICD-10-CM

## 2019-05-22 LAB — LIPID PANEL
Cholesterol: 134 mg/dL (ref 0–200)
HDL: 55.5 mg/dL (ref >39.00)
LDL Cholesterol: 64 mg/dL (ref 0–99)
NonHDL: 78.38
Total CHOL/HDL Ratio: 2
Triglycerides: 73 mg/dL (ref 0.0–149.0)
VLDL: 14.6 mg/dL (ref 0.0–40.0)

## 2019-05-22 LAB — HEMOGLOBIN A1C: Hgb A1c MFr Bld: 7.3 % — ABNORMAL HIGH (ref 4.6–6.5)

## 2019-05-22 LAB — COMPREHENSIVE METABOLIC PANEL
ALT: 19 U/L (ref 0–35)
AST: 30 U/L (ref 0–37)
Albumin: 4.2 g/dL (ref 3.5–5.2)
Alkaline Phosphatase: 112 U/L (ref 39–117)
BUN: 11 mg/dL (ref 6–23)
CO2: 27 mEq/L (ref 19–32)
Calcium: 9.5 mg/dL (ref 8.4–10.5)
Chloride: 103 mEq/L (ref 96–112)
Creatinine, Ser: 0.72 mg/dL (ref 0.40–1.20)
GFR: 79.57 mL/min (ref >60.00)
Glucose, Bld: 145 mg/dL — ABNORMAL HIGH (ref 70–99)
Potassium: 4.2 mEq/L (ref 3.5–5.1)
Sodium: 139 mEq/L (ref 135–145)
Total Bilirubin: 0.9 mg/dL (ref 0.2–1.2)
Total Protein: 7.1 g/dL (ref 6.0–8.3)

## 2019-05-24 ENCOUNTER — Ambulatory Visit (INDEPENDENT_AMBULATORY_CARE_PROVIDER_SITE_OTHER): Payer: Medicare PPO | Admitting: Internal Medicine

## 2019-05-24 ENCOUNTER — Ambulatory Visit: Payer: Medicare Other | Admitting: Internal Medicine

## 2019-05-24 ENCOUNTER — Other Ambulatory Visit: Payer: Self-pay

## 2019-05-24 ENCOUNTER — Encounter: Payer: Self-pay | Admitting: Internal Medicine

## 2019-05-24 VITALS — BP 136/79 | HR 63 | Wt 170.0 lb

## 2019-05-24 DIAGNOSIS — I10 Essential (primary) hypertension: Secondary | ICD-10-CM

## 2019-05-24 DIAGNOSIS — H9319 Tinnitus, unspecified ear: Secondary | ICD-10-CM | POA: Diagnosis not present

## 2019-05-24 DIAGNOSIS — M25552 Pain in left hip: Secondary | ICD-10-CM

## 2019-05-24 DIAGNOSIS — E1169 Type 2 diabetes mellitus with other specified complication: Secondary | ICD-10-CM | POA: Diagnosis not present

## 2019-05-24 DIAGNOSIS — H9312 Tinnitus, left ear: Secondary | ICD-10-CM | POA: Diagnosis not present

## 2019-05-24 DIAGNOSIS — G8929 Other chronic pain: Secondary | ICD-10-CM | POA: Diagnosis not present

## 2019-05-24 MED ORDER — PREDNISONE 10 MG PO TABS: ORAL_TABLET | ORAL | 0 refills | Status: DC

## 2019-05-24 NOTE — Progress Notes (Signed)
Telephone Note  This visit type was conducted due to national recommendations for restrictions regarding the COVID-19 pandemic (e.g. social distancing).  This format is felt to be most appropriate for this patient at this time.  All issues noted in this document were discussed and addressed.  No physical exam was performed (except for noted visual exam findings with Video Visits).    I connected with@ on 05/24/19 at  3:30 PM EST by telephone and verified that I am speaking with the correct person using two identifiers. Location patient: home Location provider: work or home office Persons participating in the virtual visit: patient, provider  I discussed the limitations, risks, security and privacy concerns of performing an evaluation and management service by telephone and the availability of in person appointments. I also discussed with the patient that there may be a patient responsible charge related to this service. The patient expressed understanding and agreed to proceed.   Reason for visit: follow up HPI:  74 yr old female with T2DM here for follow up on diabetes, hypertension and other issues.    Worsening of chronic  Left lateral leg pain:  Pain starts at lateral hip and radiates to knee .  Has been walking  daily 3-4 miles since the [place] had to stop after 2 blocks due to pain. No point tenderness unless she massages a cream in or rolls on a knobby all.  Hip hurts worse after being in the car even for short periods of time. She is Using Zim's Max Freeze contains menthol,   Taking MTX , so NSAID's.  Prednisone taper accepted  Tinnitus noticed , not sure which ear, loudest when occludes left ear .  Hearing is better in the left ear.    DM: she is walking daily  3-4 miles and checking blood sugars once daily at variable times.  BS have been under 130 fasting and < 150 post prandially.  Has had 4-5 hypoglyemic events over the past month, never overnight,   Usually after walking or after eating a low carb dinner,  drops to 70 .   Taking glipizide 5 mg bid . Following a carbohydrate modified diet 6 days per week intentionally losing weight 10-12 lbs. Denies numbness, burning and tingling of extremities. Appetite is good.    [facility] for pod[facility]ry [facility] July 2020   Hypertension: patient checks blood pressure twice weekly at home.  Readings have been for the most part <40/80 at rest . Patient is following a reduce salt diet most days and is taking medications as prescribed   ROS: See pertinent positives and negatives per HPI.  Past Medical History:  Diagnosis Date  . Diabetes mellitus   . Gastritis   . High cholesterol   . Hyperlipidemia    Lipitor trial was a failure  . Hypertension     Past Surgical History:  Procedure Laterality Date  . CARDIAC CATHETERIZATION  Augus 2007   normal coronaries,  EF 60%  . [place]  . SPINE SURGERY     bone graft   . TUBAL LIGATION      Family History  Problem Relation Age of Onset  . Diabetes Mother   . Heart disease Father   . Stroke Father   . Diabetes Father   . Diabetes Maternal Grandfather   . Diabetes Brother   . Diabetes Brother   . Breast cancer Neg Hx      SOCIAL HX:  reports that she  has never smoked. She has never used smokeless tobacco. She reports that she does not drink alcohol or use drugs.   Current Outpatient Medications:  .  ACCU-CHEK AVIVA PLUS test strip, USE TO CHECK BLOOD SUGAR ONCE DAILY DX: E11.65, Disp: 100 each, Rfl: 2 .  atorvastatin (LIPITOR) 80 MG tablet, TAKE 1 TABLET (80 MG TOTAL) BY MOUTH DAILY., Disp: 90 tablet, Rfl: 1 .  blood glucose meter kit and supplies KIT, Dispense based on patient and insurance preference. Use up to four times daily as directed. (FOR ICD-9 250.00, 250.01)., Disp: 1 each, Rfl: 0 .  calcium-vitamin D (OSCAL WITH D) 250-125 MG-UNIT per tablet, Take 2 tablets by mouth daily., Disp: , Rfl:  .  famotidine (PEPCID) 20 MG  tablet, TAKE 1 TABLET (20 MG TOTAL) BY MOUTH DAILY. [place], Disp: 90 tablet, Rfl: 1 .  folic acid (FOLVITE) 0.5 MG tablet, Take 0.5 mg by mouth daily., Disp: , Rfl:  .  glipiZIDE (GLUCOTROL) 5 MG tablet, TAKE 1 TABLET (5 MG TOTAL) BY MOUTH 2 (TWO) TIMES DAILY BEFORE A MEAL., Disp: 180 tablet, Rfl: 1 .  Insulin Syringe-Needle U-100 (INSULIN SYRINGE 1CC/31GX5/16") 31G X 5/16" 1 ML MISC, Use to inject insulin twice daily., Disp: 100 each, Rfl: 1 .  losartan (COZAAR) 25 MG tablet, TAKE 2 TABLETS BY MOUTH EVERY DAY, Disp: 180 tablet, Rfl: 1 .  methotrexate 50 MG/2ML injection, INJECT 0.8 MLS SUBCUTANEOUSLY ONCE A WEEK, Disp: , Rfl: 1 .  Methotrexate, Anti-Rheumatic, (METHOTREXATE, PF, [place]), Inject 0.7 mLs into the skin once a week. , Disp: , Rfl:  .  Multiple Vitamin (MULTIVITAMIN) tablet, Take 1 tablet by mouth daily., Disp: , Rfl:  .  pantoprazole (PROTONIX) 40 MG tablet, TAKE 1 TABLET BY MOUTH EVERY DAY, Disp: 90 tablet, Rfl: 1 .  predniSONE (DELTASONE) 10 MG tablet, 6 tablets on Day 1 , then reduce by 1 tablet daily until gone, Disp: 21 tablet, Rfl: 0 .  Psyllium (METAMUCIL FIBER PO), Take by mouth., Disp: , Rfl:  .  vitamin C (ASCORBIC ACID) 500 MG tablet, Take 500 mg by mouth daily., Disp: , Rfl:   EXAM:  General impression: alert, cooperative and articulate.  No signs of being in distress  Lungs: speech is fluent sentence length suggests that patient is not short of breath and not punctuated by cough, sneezing or sniffing. Marland Kitchen   Psych: affect normal.  speech is articulate and non pressured .  Denies suicidal thoughts   ASSESSMENT AND PLAN:  Discussed the following assessment and plan:  Tinnitus, unspecified laterality - Plan: Ambulatory referral to ENT  Chronic hip pain, left  Type 2 diabetes mellitus with other specified complication, without long-term current use of insulin (HCC)  Tinnitus aurium, left  Essential hypertension  Chronic hip pain, left Now with  bursitis/tendonitis of IT bad  aggravated by excessive walking.  Advised to rest for 2 weeks.  Prednisone taper   Type 2 diabetes mellitus with other specified complication ([place]) With hypertension , hyerlipidemia and fatty liver.  REcent labs unchanged and reviewed with patient  Stable  control .  Reviewed the merits of  low GI diet and regular daily exercise .  hemoglobin A1c remains  close to goal and she is having hypoglycemia 3-4 times per month  After exercising.  .. Patient is advised to reduce the glipizide dose by 50% prior to exercise .    There is  no proteinuria on  micro urinalysis . Diona Fanti has been discontinued,  continue atorvastatin.   Lab Results  Component Value Date   HGBA1C 7.3 (H) 05/22/2019  ' Lab Results  Component Value Date   MICROALBUR <0.7 11/11/2018     Tinnitus aurium, left Suggestive of sensorineural hearing loss . Referral to ENT in progress for evaluation   Hypertension Well controlled on current regimen of losartan.. Renal function is stable, no changes today.  Lab Results  Component Value Date   CREATININE 0.72 05/22/2019   Lab Results  Component Value Date   NA 139 05/22/2019   K 4.2 05/22/2019   CL 103 05/22/2019   CO2 27 05/22/2019       I discussed the assessment and treatment plan with the patient. The patient was provided an opportunity to ask questions and all were answered. The patient agreed with the plan and demonstrated an understanding of the instructions.   The patient was advised to call back or seek an in-person evaluation if the symptoms worsen or if the condition fails to improve as anticipated.   Crecencio Mc, MD

## 2019-05-27 DIAGNOSIS — H9319 Tinnitus, unspecified ear: Secondary | ICD-10-CM | POA: Insufficient documentation

## 2019-05-27 DIAGNOSIS — H9312 Tinnitus, left ear: Secondary | ICD-10-CM | POA: Insufficient documentation

## 2019-05-27 NOTE — Assessment & Plan Note (Addendum)
Suggestive of sensorineural hearing loss . Referral to ENT in progress for evaluation

## 2019-05-27 NOTE — Assessment & Plan Note (Signed)
Now with bursitis/tendonitis of IT bad  aggravated by excessive walking.  Advised to rest for 2 weeks.  Prednisone taper

## 2019-05-27 NOTE — Assessment & Plan Note (Signed)
With hypertension , hyerlipidemia and fatty liver.  REcent labs unchanged and reviewed with patient  Stable  control .  Reviewed the merits of  low GI diet and regular daily exercise .  hemoglobin A1c remains  close to goal and she is having hypoglycemia 3-4 times per month  After exercising.  .. Patient is advised to reduce the glipizide dose by 50% prior to exercise .    There is  no proteinuria on  micro urinalysis . [name] has been discontinued,   continue atorvastatin.   Lab Results  Component Value Date   HGBA1C 7.3 (H) 05/22/2019  ' Lab Results  Component Value Date   MICROALBUR <0.7 11/11/2018

## 2019-05-27 NOTE — Assessment & Plan Note (Signed)
Well controlled on current regimen of losartan.. Renal function is stable, no changes today.  Lab Results  Component Value Date   CREATININE 0.72 05/22/2019   Lab Results  Component Value Date   NA 139 05/22/2019   K 4.2 05/22/2019   CL 103 05/22/2019   CO2 27 05/22/2019

## 2019-05-29 DIAGNOSIS — G8929 Other chronic pain: Secondary | ICD-10-CM

## 2019-05-30 DIAGNOSIS — E119 Type 2 diabetes mellitus without complications: Secondary | ICD-10-CM | POA: Diagnosis not present

## 2019-05-30 LAB — HM DIABETES EYE EXAM

## 2019-06-01 ENCOUNTER — Ambulatory Visit: Payer: PRIVATE HEALTH INSURANCE | Admitting: Obstetrics and Gynecology

## 2019-06-01 ENCOUNTER — Other Ambulatory Visit: Payer: Self-pay | Admitting: Internal Medicine

## 2019-06-07 DIAGNOSIS — J301 Allergic rhinitis due to pollen: Secondary | ICD-10-CM | POA: Diagnosis not present

## 2019-06-07 DIAGNOSIS — H9319 Tinnitus, unspecified ear: Secondary | ICD-10-CM | POA: Diagnosis not present

## 2019-06-07 DIAGNOSIS — H6983 Other specified disorders of Eustachian tube, bilateral: Secondary | ICD-10-CM | POA: Diagnosis not present

## 2019-06-08 DIAGNOSIS — H903 Sensorineural hearing loss, bilateral: Secondary | ICD-10-CM | POA: Diagnosis not present

## 2019-06-09 DIAGNOSIS — M76892 Other specified enthesopathies of left lower limb, excluding foot: Secondary | ICD-10-CM | POA: Diagnosis not present

## 2019-06-09 DIAGNOSIS — Z981 Arthrodesis status: Secondary | ICD-10-CM | POA: Diagnosis not present

## 2019-06-09 DIAGNOSIS — M7062 Trochanteric bursitis, left hip: Secondary | ICD-10-CM | POA: Diagnosis not present

## 2019-06-09 DIAGNOSIS — M62838 Other muscle spasm: Secondary | ICD-10-CM | POA: Diagnosis not present

## 2019-06-09 DIAGNOSIS — M25552 Pain in left hip: Secondary | ICD-10-CM | POA: Diagnosis not present

## 2019-06-22 ENCOUNTER — Ambulatory Visit (INDEPENDENT_AMBULATORY_CARE_PROVIDER_SITE_OTHER): Payer: Medicare PPO | Admitting: Obstetrics & Gynecology

## 2019-06-22 ENCOUNTER — Encounter: Payer: Self-pay | Admitting: Obstetrics & Gynecology

## 2019-06-22 ENCOUNTER — Other Ambulatory Visit (HOSPITAL_COMMUNITY)
Admission: RE | Admit: 2019-06-22 | Discharge: 2019-06-22 | Disposition: A | Discharge status: Home / Self Care | Payer: Medicare PPO | Source: Ambulatory Visit | Attending: Obstetrics & Gynecology | Admitting: Obstetrics & Gynecology

## 2019-06-22 ENCOUNTER — Other Ambulatory Visit: Payer: Self-pay

## 2019-06-22 VITALS — BP 118/76 | HR 71 | Ht 66.0 in | Wt 173.0 lb

## 2019-06-22 DIAGNOSIS — Z79899 Other long term (current) drug therapy: Secondary | ICD-10-CM | POA: Diagnosis not present

## 2019-06-22 DIAGNOSIS — M0609 Rheumatoid arthritis without rheumatoid factor, multiple sites: Secondary | ICD-10-CM | POA: Diagnosis not present

## 2019-06-22 DIAGNOSIS — R87810 Cervical high risk human papillomavirus (HPV) DNA test positive: Secondary | ICD-10-CM | POA: Insufficient documentation

## 2019-06-22 DIAGNOSIS — Z1231 Encounter for screening mammogram for malignant neoplasm of breast: Secondary | ICD-10-CM

## 2019-06-22 DIAGNOSIS — Z01419 Encounter for gynecological examination (general) (routine) without abnormal findings: Secondary | ICD-10-CM | POA: Insufficient documentation

## 2019-06-22 DIAGNOSIS — Z780 Asymptomatic menopausal state: Secondary | ICD-10-CM | POA: Insufficient documentation

## 2019-06-22 NOTE — Progress Notes (Signed)
GYNECOLOGY ANNUAL PREVENTATIVE CARE ENCOUNTER NOTE  History:     [name] is a 74 y.o. [phone] female here for a routine annual gynecologic exam.  Current complaints: none.   Denies abnormal vaginal bleeding, discharge, pelvic pain, problems with intercourse or other gynecologic concerns.    Gynecologic History No LMP recorded. Patient is postmenopausal. Contraception: post menopausal status Last Pap: 05/05/2018. 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 Last mammogram: 06/01/2018. Results were: normal (done at [facility], results in [place])  Obstetric History OB History  Gravida Para Term Preterm AB Living  2 2 2  0 0 2  SAB TAB Ectopic Multiple Live Births  0 0 0 0 2    # Outcome Date GA Lbr Len/2nd Weight Sex Delivery Anes PTL Lv  2 Term     M Vag-Spont   LIV  1 Term     M Vag-Spont   LIV    Past Medical History:  Diagnosis Date  . Diabetes mellitus   . Gastritis   . High cholesterol   . Hyperlipidemia    Lipitor trial was a failure  . Hypertension     Past Surgical History:  Procedure Laterality Date  . CARDIAC CATHETERIZATION  Augus 2007   normal coronaries,  EF 60%  . [place]  . SPINE SURGERY     bone graft   . TUBAL LIGATION      Current Outpatient Medications on File Prior to Visit  Medication Sig Dispense Refill  . ACCU-CHEK AVIVA PLUS test strip USE TO CHECK BLOOD SUGAR ONCE DAILY DX: E11.65 100 strip 2  . atorvastatin (LIPITOR) 80 MG tablet TAKE 1 TABLET (80 MG TOTAL) BY MOUTH DAILY. 90 tablet 1  . blood glucose meter kit and supplies KIT Dispense based on patient and insurance preference. Use up to four times daily as directed. (FOR ICD-9 250.00, 250.01). 1 each 0  . famotidine (PEPCID) 20 MG  tablet TAKE 1 TABLET (20 MG TOTAL) BY MOUTH DAILY. 30 MINUTES BEFORE DINNER 90 tablet 1  . folic acid (FOLVITE) 0.5 MG tablet Take 0.5 mg by mouth daily.    Marland Kitchen glipiZIDE (GLUCOTROL) 5 MG tablet TAKE 1 TABLET (5 MG TOTAL) BY MOUTH 2 (TWO) TIMES DAILY BEFORE A MEAL. 180 tablet 1  . losartan (COZAAR) 25 MG tablet TAKE 2 TABLETS BY MOUTH EVERY DAY 180 tablet 1  . methotrexate 50 MG/2ML injection INJECT 0.8 MLS SUBCUTANEOUSLY ONCE A WEEK  1  . Methotrexate, Anti-Rheumatic, (METHOTREXATE, PF, ) Inject 0.7 mLs into the skin once a week.     . Multiple Vitamin (MULTIVITAMIN) tablet Take 1 tablet by mouth daily.    . pantoprazole (PROTONIX) 40 MG tablet TAKE 1 TABLET BY MOUTH EVERY DAY 90 tablet 1  . Psyllium (METAMUCIL FIBER PO) Take by mouth.    . vitamin C (ASCORBIC ACID) 500 MG tablet Take 500 mg by mouth daily.    . calcium-vitamin D (OSCAL WITH D) 250-125 MG-UNIT per tablet Take 2 tablets by mouth daily.    . Insulin Syringe-Needle U-100 (INSULIN SYRINGE 1CC/31GX5/16") 31G X 5/16" 1 ML MISC Use to inject insulin twice daily. (Patient not taking: Reported on 06/22/2019) 100 each 1  . predniSONE (DELTASONE) 10 MG tablet 6 tablets on Day 1 , then  reduce by 1 tablet daily until gone (Patient not taking: Reported on 06/22/2019) 21 tablet 0  . [DISCONTINUED] ferrous sulfate 325 (65 FE) MG tablet Take 325 mg by mouth daily with breakfast.      . [DISCONTINUED] omeprazole (PRILOSEC) 40 MG capsule Take 1 capsule (40 mg total) by mouth daily. 30 capsule 1   No current facility-administered medications on file prior to visit.    Allergies  Allergen Reactions  . Amoxicillin Hives  . Latex Rash    Social History:  reports that she has never smoked. She has never used smokeless tobacco. She reports that she does not drink alcohol or use drugs.  Family History  Problem Relation Age of Onset  . Diabetes Mother   . Heart disease Father   . Stroke Father   . Diabetes Father   . Diabetes Maternal Grandfather    . Diabetes Brother   . Diabetes Brother   . Breast cancer Neg Hx     The following portions of the patient's history were reviewed and updated as appropriate: allergies, current medications, past family history, past medical history, past social history, past surgical history and problem list.  Review of Systems Pertinent items noted in HPI and remainder of comprehensive ROS otherwise negative.  Physical Exam:  BP 118/76   Pulse 71   Ht 5' 6"  (1.676 m)   Wt 173 lb (78.5 kg)   BMI 27.92 kg/m  CONSTITUTIONAL: Well-developed, well-nourished female in no acute distress.  HENT:  Normocephalic, atraumatic, External right and left ear normal. Oropharynx is clear and moist EYES: Conjunctivae and EOM are normal. Pupils are equal, round, and reactive to light. No scleral icterus.  NECK: Normal range of motion, supple, no masses.  Normal thyroid.  SKIN: Skin is warm and dry. No rash noted. Not diaphoretic. No erythema. No pallor. MUSCULOSKELETAL: Normal range of motion. No tenderness.  No cyanosis, clubbing, or edema.  2+ distal pulses. NEUROLOGIC: Alert and oriented to person, place, and time. Normal reflexes, muscle tone coordination.  PSYCHIATRIC: Normal mood and affect. Normal behavior. Normal judgment and thought content. CARDIOVASCULAR: Normal heart rate noted, regular rhythm RESPIRATORY: Clear to auscultation bilaterally. Effort and breath sounds normal, no problems with respiration noted. BREASTS: Symmetric in size. No masses, tenderness, skin changes, nipple drainage, or lymphadenopathy bilaterally. ABDOMEN: Soft, no distention noted.  No tenderness, rebound or guarding.  PELVIC: Normal appearing external genitalia; normal appearing vaginal mucosa with moderate atrophy and cervix. No abnormal discharge noted. Pap smear obtained. Normal uterine size, no other palpable masses, no uterine or adnexal tenderness.   Assessment and Plan:      1. Encounter for screening mammogram for  breast cancer Mammogram scheduled - MM 3D SCREEN BREAST BILATERAL; Future  2. Normal cytology on pap smears, but positive high risk HPV in 02/28/13, 03/08/14, 03/22/15, 03/24/16, 04/20/17, 05/05/2018.  All negative for HPV 16 and 18/45. 3. Well woman exam with routine gynecological exam - Cytology - PAP( [facility]) Will follow up results of pap smear and manage accordingly. As per previous conversation with GYN ONC specialist, will just continue yearly screening for now. No further intervention needed unless there is a cytology anomaly or positive for 16, 18/45 Routine preventative health maintenance measures emphasized. Please refer to After Visit Summary for other counseling recommendations.      Verita Schneiders, MD, [place] for [employer], [place]

## 2019-06-22 NOTE — Patient Instructions (Signed)
Preventive Care 38 Years and Older, Female Preventive care refers to lifestyle choices and visits with your health care provider that can promote health and wellness. This includes:  A yearly physical exam. This is also called an annual well check.  Regular dental and eye exams.  Immunizations.  Screening for certain conditions.  Healthy lifestyle choices, such as diet and exercise. What can I expect for my preventive care visit? Physical exam Your health care provider will check:  Height and weight. These may be used to calculate body mass index (BMI), which is a measurement that tells if you are at a healthy weight.  Heart rate and blood pressure.  Your skin for abnormal spots. Counseling Your health care provider may ask you questions about:  Alcohol, tobacco, and drug use.  Emotional well-being.  Home and relationship well-being.  Sexual activity.  Eating habits.  History of falls.  Memory and ability to understand (cognition).  Work and work Statistician.  Pregnancy and menstrual history. What immunizations do I need?  Influenza (flu) vaccine  This is recommended every year. Tetanus, diphtheria, and pertussis (Tdap) vaccine  You may need a Td booster every 10 years. Varicella (chickenpox) vaccine  You may need this vaccine if you have not already been vaccinated. Zoster (shingles) vaccine  You may need this after age 33. Pneumococcal conjugate (PCV13) vaccine  One dose is recommended after age 33. Pneumococcal polysaccharide (PPSV23) vaccine  One dose is recommended after age 72. Measles, mumps, and rubella (MMR) vaccine  You may need at least one dose of MMR if you were born in 1957 or later. You may also need a second dose. Meningococcal conjugate (MenACWY) vaccine  You may need this if you have certain conditions. Hepatitis A vaccine  You may need this if you have certain conditions or if you travel or work in places where you may be exposed  to hepatitis A. Hepatitis B vaccine  You may need this if you have certain conditions or if you travel or work in places where you may be exposed to hepatitis B. Haemophilus influenzae type b (Hib) vaccine  You may need this if you have certain conditions. You may receive vaccines as individual doses or as more than one vaccine together in one shot (combination vaccines). Talk with your health care provider about the risks and benefits of combination vaccines. What tests do I need? Blood tests  Lipid and cholesterol levels. These may be checked every 5 years, or more frequently depending on your overall health.  Hepatitis C test.  Hepatitis B test. Screening  Lung cancer screening. You may have this screening every year starting at age 39 if you have a 30-pack-year history of smoking and currently smoke or have quit within the past 15 years.  Colorectal cancer screening. All adults should have this screening starting at age 36 and continuing until age 15. Your health care provider may recommend screening at age 23 if you are at increased risk. You will have tests every 1-10 years, depending on your results and the type of screening test.  Diabetes screening. This is done by checking your blood sugar (glucose) after you have not eaten for a while (fasting). You may have this done every 1-3 years.  Mammogram. This may be done every 1-2 years. Talk with your health care provider about how often you should have regular mammograms.  BRCA-related cancer screening. This may be done if you have a family history of breast, ovarian, tubal, or peritoneal cancers.  Other tests  Sexually transmitted disease (STD) testing.  Bone density scan. This is done to screen for osteoporosis. You may have this done starting at age 44. Follow these instructions at home: Eating and drinking  Eat a diet that includes fresh fruits and vegetables, whole grains, lean protein, and low-fat dairy products. Limit  your intake of foods with high amounts of sugar, saturated fats, and salt.  Take vitamin and mineral supplements as recommended by your health care provider.  Do not drink alcohol if your health care provider tells you not to drink.  If you drink alcohol: ? Limit how much you have to 0-1 drink a day. ? Be aware of how much alcohol is in your drink. In the [place] one drink equals one 12 oz bottle of beer (355 mL), one 5 oz glass of wine (148 mL), or one 1 oz glass of hard liquor (44 mL). Lifestyle  Take daily care of your teeth and gums.  Stay active. Exercise for at least 30 minutes on 5 or more days each week.  Do not use any products that contain nicotine or tobacco, such as cigarettes, e-cigarettes, and chewing tobacco. If you need help quitting, ask your health care provider.  If you are sexually active, practice safe sex. Use a condom or other form of protection in order to prevent STIs (sexually transmitted infections).  Talk with your health care provider about taking a low-dose aspirin or statin. What's next?  Go to your health care provider once a year for a well check visit.  Ask your health care provider how often you should have your eyes and teeth checked.  Stay up to date on all vaccines. This information is not intended to replace advice given to you by your health care provider. Make sure you discuss any questions you have with your health care provider. Document Revised: 04/28/2018 Document Reviewed: 04/28/2018 Elsevier Patient Education  2020 [employer].

## 2019-06-28 ENCOUNTER — Telehealth: Payer: Self-pay | Admitting: Internal Medicine

## 2019-06-28 DIAGNOSIS — E782 Mixed hyperlipidemia: Secondary | ICD-10-CM

## 2019-06-28 DIAGNOSIS — E1169 Type 2 diabetes mellitus with other specified complication: Secondary | ICD-10-CM

## 2019-06-28 LAB — CYTOLOGY - PAP
Comment: NEGATIVE
Comment: NEGATIVE
Comment: NEGATIVE
Diagnosis: NEGATIVE
HPV 16: NEGATIVE
HPV 18 / 45: NEGATIVE
High risk HPV: POSITIVE — AB

## 2019-06-28 NOTE — Telephone Encounter (Signed)
Patient has a follow up on 08/25/2019, she would like to have labs before appt. No lab orders are in her chart. Please let me know if I can schedule labs for her. Thanks.

## 2019-06-29 DIAGNOSIS — Z79899 Other long term (current) drug therapy: Secondary | ICD-10-CM | POA: Diagnosis not present

## 2019-06-29 DIAGNOSIS — M0609 Rheumatoid arthritis without rheumatoid factor, multiple sites: Secondary | ICD-10-CM | POA: Diagnosis not present

## 2019-06-29 DIAGNOSIS — M353 Polymyalgia rheumatica: Secondary | ICD-10-CM | POA: Diagnosis not present

## 2019-06-29 DIAGNOSIS — E119 Type 2 diabetes mellitus without complications: Secondary | ICD-10-CM | POA: Diagnosis not present

## 2019-07-07 ENCOUNTER — Other Ambulatory Visit: Payer: Self-pay | Admitting: Internal Medicine

## 2019-07-07 NOTE — Telephone Encounter (Signed)
Pt would like to have her lab done before her appt on 08/25/2019. I have ordered A1c, CMP, and Lipid panel. Is there anything else that needs to be ordered?

## 2019-07-07 NOTE — Telephone Encounter (Signed)
Thanks

## 2019-07-10 DIAGNOSIS — M25551 Pain in right hip: Secondary | ICD-10-CM | POA: Diagnosis not present

## 2019-07-10 DIAGNOSIS — M25552 Pain in left hip: Secondary | ICD-10-CM | POA: Diagnosis not present

## 2019-07-10 DIAGNOSIS — R262 Difficulty in walking, not elsewhere classified: Secondary | ICD-10-CM | POA: Diagnosis not present

## 2019-07-10 DIAGNOSIS — M6281 Muscle weakness (generalized): Secondary | ICD-10-CM | POA: Diagnosis not present

## 2019-07-10 NOTE — Telephone Encounter (Signed)
Lab appt has been scheduled.  

## 2019-07-10 NOTE — Telephone Encounter (Signed)
LMTCB. Need to schedule pt for a fasting lab appt after 08/20/2019 but before 08/25/2019.

## 2019-07-14 DIAGNOSIS — M25552 Pain in left hip: Secondary | ICD-10-CM | POA: Diagnosis not present

## 2019-07-14 DIAGNOSIS — R262 Difficulty in walking, not elsewhere classified: Secondary | ICD-10-CM | POA: Diagnosis not present

## 2019-07-14 DIAGNOSIS — M25551 Pain in right hip: Secondary | ICD-10-CM | POA: Diagnosis not present

## 2019-07-14 DIAGNOSIS — M6281 Muscle weakness (generalized): Secondary | ICD-10-CM | POA: Diagnosis not present

## 2019-07-19 DIAGNOSIS — M6281 Muscle weakness (generalized): Secondary | ICD-10-CM | POA: Diagnosis not present

## 2019-07-19 DIAGNOSIS — M25552 Pain in left hip: Secondary | ICD-10-CM | POA: Diagnosis not present

## 2019-07-19 DIAGNOSIS — M25551 Pain in right hip: Secondary | ICD-10-CM | POA: Diagnosis not present

## 2019-07-19 DIAGNOSIS — R262 Difficulty in walking, not elsewhere classified: Secondary | ICD-10-CM | POA: Diagnosis not present

## 2019-07-24 DIAGNOSIS — M25552 Pain in left hip: Secondary | ICD-10-CM | POA: Diagnosis not present

## 2019-07-24 DIAGNOSIS — M25551 Pain in right hip: Secondary | ICD-10-CM | POA: Diagnosis not present

## 2019-07-24 DIAGNOSIS — R262 Difficulty in walking, not elsewhere classified: Secondary | ICD-10-CM | POA: Diagnosis not present

## 2019-07-24 DIAGNOSIS — M6281 Muscle weakness (generalized): Secondary | ICD-10-CM | POA: Diagnosis not present

## 2019-07-27 DIAGNOSIS — R262 Difficulty in walking, not elsewhere classified: Secondary | ICD-10-CM | POA: Diagnosis not present

## 2019-07-27 DIAGNOSIS — Z20822 Contact with and (suspected) exposure to covid-19: Secondary | ICD-10-CM | POA: Diagnosis not present

## 2019-07-27 DIAGNOSIS — M6281 Muscle weakness (generalized): Secondary | ICD-10-CM | POA: Diagnosis not present

## 2019-07-27 DIAGNOSIS — M25552 Pain in left hip: Secondary | ICD-10-CM | POA: Diagnosis not present

## 2019-07-27 DIAGNOSIS — M25551 Pain in right hip: Secondary | ICD-10-CM | POA: Diagnosis not present

## 2019-07-31 DIAGNOSIS — R262 Difficulty in walking, not elsewhere classified: Secondary | ICD-10-CM | POA: Diagnosis not present

## 2019-07-31 DIAGNOSIS — M25552 Pain in left hip: Secondary | ICD-10-CM | POA: Diagnosis not present

## 2019-07-31 DIAGNOSIS — M25551 Pain in right hip: Secondary | ICD-10-CM | POA: Diagnosis not present

## 2019-07-31 DIAGNOSIS — M6281 Muscle weakness (generalized): Secondary | ICD-10-CM | POA: Diagnosis not present

## 2019-08-03 DIAGNOSIS — M6281 Muscle weakness (generalized): Secondary | ICD-10-CM | POA: Diagnosis not present

## 2019-08-03 DIAGNOSIS — M25551 Pain in right hip: Secondary | ICD-10-CM | POA: Diagnosis not present

## 2019-08-03 DIAGNOSIS — R262 Difficulty in walking, not elsewhere classified: Secondary | ICD-10-CM | POA: Diagnosis not present

## 2019-08-03 DIAGNOSIS — M25552 Pain in left hip: Secondary | ICD-10-CM | POA: Diagnosis not present

## 2019-08-04 DIAGNOSIS — Z1231 Encounter for screening mammogram for malignant neoplasm of breast: Secondary | ICD-10-CM | POA: Diagnosis not present

## 2019-08-04 LAB — HM MAMMOGRAPHY

## 2019-08-07 ENCOUNTER — Encounter: Payer: Self-pay | Admitting: Radiology

## 2019-08-07 DIAGNOSIS — R262 Difficulty in walking, not elsewhere classified: Secondary | ICD-10-CM | POA: Diagnosis not present

## 2019-08-07 DIAGNOSIS — M25551 Pain in right hip: Secondary | ICD-10-CM | POA: Diagnosis not present

## 2019-08-07 DIAGNOSIS — M6281 Muscle weakness (generalized): Secondary | ICD-10-CM | POA: Diagnosis not present

## 2019-08-07 DIAGNOSIS — M25552 Pain in left hip: Secondary | ICD-10-CM | POA: Diagnosis not present

## 2019-08-10 DIAGNOSIS — M25552 Pain in left hip: Secondary | ICD-10-CM | POA: Diagnosis not present

## 2019-08-10 DIAGNOSIS — R262 Difficulty in walking, not elsewhere classified: Secondary | ICD-10-CM | POA: Diagnosis not present

## 2019-08-10 DIAGNOSIS — M25551 Pain in right hip: Secondary | ICD-10-CM | POA: Diagnosis not present

## 2019-08-10 DIAGNOSIS — M6281 Muscle weakness (generalized): Secondary | ICD-10-CM | POA: Diagnosis not present

## 2019-08-14 DIAGNOSIS — M6281 Muscle weakness (generalized): Secondary | ICD-10-CM | POA: Diagnosis not present

## 2019-08-14 DIAGNOSIS — M25551 Pain in right hip: Secondary | ICD-10-CM | POA: Diagnosis not present

## 2019-08-14 DIAGNOSIS — M25552 Pain in left hip: Secondary | ICD-10-CM | POA: Diagnosis not present

## 2019-08-14 DIAGNOSIS — R262 Difficulty in walking, not elsewhere classified: Secondary | ICD-10-CM | POA: Diagnosis not present

## 2019-08-18 DIAGNOSIS — M25552 Pain in left hip: Secondary | ICD-10-CM | POA: Diagnosis not present

## 2019-08-18 DIAGNOSIS — M6281 Muscle weakness (generalized): Secondary | ICD-10-CM | POA: Diagnosis not present

## 2019-08-18 DIAGNOSIS — M25551 Pain in right hip: Secondary | ICD-10-CM | POA: Diagnosis not present

## 2019-08-18 DIAGNOSIS — R262 Difficulty in walking, not elsewhere classified: Secondary | ICD-10-CM | POA: Diagnosis not present

## 2019-08-21 ENCOUNTER — Other Ambulatory Visit (INDEPENDENT_AMBULATORY_CARE_PROVIDER_SITE_OTHER): Payer: Medicare PPO

## 2019-08-21 ENCOUNTER — Other Ambulatory Visit: Payer: Self-pay

## 2019-08-21 DIAGNOSIS — M25551 Pain in right hip: Secondary | ICD-10-CM | POA: Diagnosis not present

## 2019-08-21 DIAGNOSIS — E782 Mixed hyperlipidemia: Secondary | ICD-10-CM

## 2019-08-21 DIAGNOSIS — M6281 Muscle weakness (generalized): Secondary | ICD-10-CM | POA: Diagnosis not present

## 2019-08-21 DIAGNOSIS — E1169 Type 2 diabetes mellitus with other specified complication: Secondary | ICD-10-CM

## 2019-08-21 DIAGNOSIS — M25552 Pain in left hip: Secondary | ICD-10-CM | POA: Diagnosis not present

## 2019-08-21 DIAGNOSIS — R262 Difficulty in walking, not elsewhere classified: Secondary | ICD-10-CM | POA: Diagnosis not present

## 2019-08-21 LAB — MICROALBUMIN / CREATININE URINE RATIO
Creatinine,U: 59.4 mg/dL
Microalb Creat Ratio: 1.2 mg/g (ref 0.0–30.0)
Microalb, Ur: <0.7 mg/dL (ref 0.0–1.9)

## 2019-08-21 LAB — LIPID PANEL
Cholesterol: 134 mg/dL (ref 0–200)
HDL: 54.1 mg/dL (ref >39.00)
LDL Cholesterol: 67 mg/dL (ref 0–99)
NonHDL: 80.01
Total CHOL/HDL Ratio: 2
Triglycerides: 67 mg/dL (ref 0.0–149.0)
VLDL: 13.4 mg/dL (ref 0.0–40.0)

## 2019-08-21 LAB — COMPREHENSIVE METABOLIC PANEL
ALT: 17 U/L (ref 0–35)
AST: 27 U/L (ref 0–37)
Albumin: 4.1 g/dL (ref 3.5–5.2)
Alkaline Phosphatase: 94 U/L (ref 39–117)
BUN: 15 mg/dL (ref 6–23)
CO2: 29 mEq/L (ref 19–32)
Calcium: 9.2 mg/dL (ref 8.4–10.5)
Chloride: 101 mEq/L (ref 96–112)
Creatinine, Ser: 0.75 mg/dL (ref 0.40–1.20)
GFR: 75.86 mL/min (ref >60.00)
Glucose, Bld: 162 mg/dL — ABNORMAL HIGH (ref 70–99)
Potassium: 4 mEq/L (ref 3.5–5.1)
Sodium: 137 mEq/L (ref 135–145)
Total Bilirubin: 1 mg/dL (ref 0.2–1.2)
Total Protein: 6.8 g/dL (ref 6.0–8.3)

## 2019-08-21 LAB — HEMOGLOBIN A1C: Hgb A1c MFr Bld: 7.3 % — ABNORMAL HIGH (ref 4.6–6.5)

## 2019-08-23 ENCOUNTER — Encounter: Payer: Self-pay | Admitting: Internal Medicine

## 2019-08-23 ENCOUNTER — Ambulatory Visit: Payer: Medicare PPO | Admitting: Internal Medicine

## 2019-08-23 ENCOUNTER — Other Ambulatory Visit: Payer: Self-pay

## 2019-08-23 VITALS — BP 128/68 | HR 71 | Temp 97.7°F | Resp 15 | Ht 66.0 in | Wt 172.2 lb

## 2019-08-23 DIAGNOSIS — Q2112 Patent foramen ovale: Secondary | ICD-10-CM

## 2019-08-23 DIAGNOSIS — R262 Difficulty in walking, not elsewhere classified: Secondary | ICD-10-CM | POA: Diagnosis not present

## 2019-08-23 DIAGNOSIS — E1169 Type 2 diabetes mellitus with other specified complication: Secondary | ICD-10-CM | POA: Diagnosis not present

## 2019-08-23 DIAGNOSIS — M25551 Pain in right hip: Secondary | ICD-10-CM | POA: Diagnosis not present

## 2019-08-23 DIAGNOSIS — K760 Fatty (change of) liver, not elsewhere classified: Secondary | ICD-10-CM | POA: Diagnosis not present

## 2019-08-23 DIAGNOSIS — M6281 Muscle weakness (generalized): Secondary | ICD-10-CM | POA: Diagnosis not present

## 2019-08-23 DIAGNOSIS — I10 Essential (primary) hypertension: Secondary | ICD-10-CM | POA: Diagnosis not present

## 2019-08-23 DIAGNOSIS — Q211 Atrial septal defect: Secondary | ICD-10-CM | POA: Diagnosis not present

## 2019-08-23 DIAGNOSIS — M25552 Pain in left hip: Secondary | ICD-10-CM | POA: Diagnosis not present

## 2019-08-23 NOTE — Patient Instructions (Signed)
Reduce your morning and evening dose of glipizide to 1/2 tablet if the meal is low carb

## 2019-08-23 NOTE — Assessment & Plan Note (Signed)
Well controlled on current regimen. Renal function stable, no changes today.  Lab Results  Component Value Date   CREATININE 0.75 08/21/2019   Lab Results  Component Value Date   NA 137 08/21/2019   K 4.0 08/21/2019   CL 101 08/21/2019   CO2 29 08/21/2019

## 2019-08-23 NOTE — Assessment & Plan Note (Signed)
Mild,  Found during workup for syncope.  Follow up with [name]

## 2019-08-23 NOTE — Assessment & Plan Note (Signed)
a1c at goal for age.  Recurrent hypoglycemia addressed : advised to reduce dose of glipizide to 2.5 mg for low carb meals.

## 2019-08-23 NOTE — Assessment & Plan Note (Signed)
Secondary to obesity and diabetes/metabolic syndrome. I have encouraged lowering of   BMI and adherence  To a low glycemic index diet utilizing smaller more frequent meals to increase metabolism.  She is walking 3 miles daily    Lab Results  Component Value Date   ALT 17 08/21/2019   AST 27 08/21/2019   ALKPHOS 94 08/21/2019   BILITOT 1.0 08/21/2019   Repeat  revaccination for Hep a and B complete

## 2019-08-23 NOTE — Progress Notes (Signed)
Subjective:  Patient ID: [name], female    DOB: [birth year]  Age: 74 y.o. MRN: [id number]  CC: The primary encounter diagnosis was Type 2 diabetes mellitus with other specified complication, without long-term current use of insulin ([place]). Diagnoses of Essential hypertension, Fatty liver disease, nonalcoholic, and PFO (patent foramen ovale) were also pertinent to this visit.  HPI  [name] presents for followup   This visit occurred during the SARS-CoV-2 public health emergency.  Safety protocols were in place, including screening questions prior to the visit, additional usage of staff PPE, and extensive cleaning of exam room while observing appropriate contact time as indicated for disinfecting solutions.    Patient has received both doses of the [place] 19 vaccine without complications.  Patient continues to mask when outside of the home except when walking in yard or at safe distances from others .  Patient denies any change in mood or development of unhealthy behaviors resuting from the pandemic's restriction of activities and socialization.    Treated by me with a prednisone taper for left hip pain in January referred to orthopedist,  No injection given,  Sent to [facility] for PT   Right 2nd toe bleeding when she pulled her sock off today.  Sees [facility] last visit July for debridement dystrophic nails and  BS reviewed.  thinks the flonase raised sugars in January. Mot recent sugars on Monday April 5 were all very elevated for several hours/then normalized by bedtime PPS was 98 this morning after  bfast .  Having recurrent lows to 68 occurring mid morning and after dinner  Getting PT before her morning walk ,  2/ week   For the hip /thigh,  Helping a lot  Outpatient Medications Prior to Visit  Medication Sig Dispense Refill  . ACCU-CHEK AVIVA PLUS test strip USE TO CHECK BLOOD SUGAR ONCE DAILY DX: E11.65 100 strip 2  . atorvastatin (LIPITOR) 80 MG tablet TAKE 1 TABLET (80 MG  TOTAL) BY MOUTH DAILY. 90 tablet 1  . blood glucose meter kit and supplies KIT Dispense based on patient and insurance preference. Use up to four times daily as directed. (FOR ICD-9 250.00, 250.01). 1 each 0  . calcium-vitamin D (OSCAL WITH D) 250-125 MG-UNIT per tablet Take 2 tablets by mouth daily.    . famotidine (PEPCID) 20 MG tablet TAKE 1 TABLET (20 MG TOTAL) BY MOUTH DAILY. 30 MINUTES BEFORE DINNER 90 tablet 1  . folic acid (FOLVITE) 0.5 MG tablet Take 0.5 mg by mouth daily.    Marland Kitchen glipiZIDE (GLUCOTROL) 5 MG tablet TAKE 1 TABLET (5 MG TOTAL) BY MOUTH 2 (TWO) TIMES DAILY BEFORE A MEAL. 180 tablet 1  . Insulin Syringe-Needle U-100 (INSULIN SYRINGE 1CC/31GX5/16") 31G X 5/16" 1 ML MISC Use to inject insulin twice daily. 100 each 1  . losartan (COZAAR) 25 MG tablet TAKE 2 TABLETS BY MOUTH EVERY DAY 180 tablet 1  . methotrexate 50 MG/2ML injection INJECT 0.8 MLS SUBCUTANEOUSLY ONCE A WEEK  1  . Methotrexate, Anti-Rheumatic, (METHOTREXATE, PF, [place]) Inject 0.7 mLs into the skin once a week.     . Multiple Vitamin (MULTIVITAMIN) tablet Take 1 tablet by mouth daily.    . pantoprazole (PROTONIX) 40 MG tablet TAKE 1 TABLET BY MOUTH EVERY DAY 90 tablet 1  . Psyllium (METAMUCIL FIBER PO) Take by mouth.    . vitamin C (ASCORBIC ACID) 500 MG tablet Take 500 mg by mouth daily.    . predniSONE (DELTASONE) 10 MG  tablet 6 tablets on Day 1 , then reduce by 1 tablet daily until gone (Patient not taking: Reported on 06/22/2019) 21 tablet 0   No facility-administered medications prior to visit.    Review of Systems;  Patient denies headache, fevers, malaise, unintentional weight loss, skin rash, eye pain, sinus congestion and sinus pain, sore throat, dysphagia,  hemoptysis , cough, dyspnea, wheezing, chest pain, palpitations, orthopnea, edema, abdominal pain, nausea, melena, diarrhea, constipation, flank pain, dysuria, hematuria, urinary  Frequency, nocturia, numbness, tingling, seizures,  Focal weakness, Loss of  consciousness,  Tremor, insomnia, depression, anxiety, and suicidal ideation.      Objective:  BP 128/68 (BP Location: Left Arm, Patient Position: Sitting, Cuff Size: Normal)   Pulse 71   Temp 97.7 F (36.5 C) (Temporal)   Resp 15   Ht 5' 6"  (1.676 m)   Wt 172 lb 3.2 oz (78.1 kg)   SpO2 98%   BMI 27.79 kg/m   BP Readings from Last 3 Encounters:  08/23/19 128/68  06/22/19 118/76  05/24/19 136/79    Wt Readings from Last 3 Encounters:  08/23/19 172 lb 3.2 oz (78.1 kg)  06/22/19 173 lb (78.5 kg)  05/24/19 170 lb (77.1 kg)    General appearance: alert, cooperative and appears stated age Ears: normal TM's and external ear canals both ears Throat: lips, mucosa, and tongue normal; teeth and gums normal Neck: no adenopathy, no carotid bruit, supple, symmetrical, trachea midline and thyroid not enlarged, symmetric, no tenderness/mass/nodules Back: symmetric, no curvature. ROM normal. No CVA tenderness. Lungs: clear to auscultation bilaterally Heart: regular rate and rhythm, S1, S2 normal, no murmur, click, rub or gallop Abdomen: soft, non-tender; bowel sounds normal; no masses,  no organomegaly Pulses: 2+ and symmetric Skin: Skin color, texture, turgor normal. No rashes or lesions Lymph nodes: Cervical, supraclavicular, and axillary nodes normal. 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  Great toe with horny overgrowth    Lab Results  Component Value Date   HGBA1C 7.3 (H) 08/21/2019   HGBA1C 7.3 (H) 05/22/2019   HGBA1C 7.2 (H) 11/11/2018    Lab Results  Component Value Date   CREATININE 0.75 08/21/2019   CREATININE 0.72 05/22/2019   CREATININE 0.63 01/05/2019    Lab Results  Component Value Date   WBC 6.5 01/05/2019   HGB 14.4 01/05/2019   HCT 43.1 01/05/2019   PLT 213 01/05/2019   GLUCOSE 162 (H) 08/21/2019   CHOL 134 08/21/2019   TRIG 67.0 08/21/2019   HDL 54.10 08/21/2019   LDLDIRECT 81 07/29/2017   LDLCALC  67 08/21/2019   ALT 17 08/21/2019   AST 27 08/21/2019   NA 137 08/21/2019   K 4.0 08/21/2019   CL 101 08/21/2019   CREATININE 0.75 08/21/2019   BUN 15 08/21/2019   CO2 29 08/21/2019   TSH 2.34 12/24/2016   HGBA1C 7.3 (H) 08/21/2019   MICROALBUR <0.7 08/21/2019    No results found.  Assessment & Plan:   Problem List Items Addressed This Visit      Unprioritized   Type 2 diabetes mellitus with other specified complication ([place]) - Primary    a1c at goal for age.  Recurrent hypoglycemia addressed : advised to reduce dose of glipizide to 2.5 mg for low carb meals.       Relevant Orders   Hemoglobin A1c   Lipid panel   Comprehensive metabolic panel   Microalbumin / creatinine urine ratio  Hypertension    Well controlled on current regimen. Renal function stable, no changes today.  Lab Results  Component Value Date   CREATININE 0.75 08/21/2019   Lab Results  Component Value Date   NA 137 08/21/2019   K 4.0 08/21/2019   CL 101 08/21/2019   CO2 29 08/21/2019         Fatty liver disease, nonalcoholic    Secondary to obesity and diabetes/metabolic syndrome. I have encouraged lowering of   BMI and adherence  To a low glycemic index diet utilizing smaller more frequent meals to increase metabolism.  She is walking 3 miles daily    Lab Results  Component Value Date   ALT 17 08/21/2019   AST 27 08/21/2019   ALKPHOS 94 08/21/2019   BILITOT 1.0 08/21/2019   Repeat  revaccination for Hep a and B complete       PFO (patent foramen ovale)    Mild,  Found during workup for syncope.  Follow up with Nehemiah Massed          I have discontinued Loreen Freud. Handrich's predniSONE. I am also having her maintain her vitamin C, multivitamin, calcium-vitamin D, blood glucose meter kit and supplies, (Methotrexate, Anti-Rheumatic, (METHOTREXATE, PF, [place])), Psyllium (METAMUCIL FIBER PO), folic acid, methotrexate, INSULIN SYRINGE 1CC/31GX5/16", famotidine, losartan, atorvastatin, Accu-Chek Aviva  Plus, glipiZIDE, and pantoprazole.  No orders of the defined types were placed in this encounter.  I provided  30 minutes of  face-to-face time during this encounter reviewing patient's current problems and past surgeries, labs and imaging studies, providing counseling on the above mentioned problems , and coordination  of care .  Medications Discontinued During This Encounter  Medication Reason  . predniSONE (DELTASONE) 10 MG tablet Completed Course    Follow-up: Return in about 6 months (around 02/22/2020).   Crecencio Mc, MD

## 2019-08-25 ENCOUNTER — Ambulatory Visit: Payer: Medicare PPO | Admitting: Internal Medicine

## 2019-08-29 DIAGNOSIS — M25551 Pain in right hip: Secondary | ICD-10-CM | POA: Diagnosis not present

## 2019-08-29 DIAGNOSIS — R262 Difficulty in walking, not elsewhere classified: Secondary | ICD-10-CM | POA: Diagnosis not present

## 2019-08-29 DIAGNOSIS — M25552 Pain in left hip: Secondary | ICD-10-CM | POA: Diagnosis not present

## 2019-08-29 DIAGNOSIS — M6281 Muscle weakness (generalized): Secondary | ICD-10-CM | POA: Diagnosis not present

## 2019-08-31 DIAGNOSIS — R262 Difficulty in walking, not elsewhere classified: Secondary | ICD-10-CM | POA: Diagnosis not present

## 2019-08-31 DIAGNOSIS — M25551 Pain in right hip: Secondary | ICD-10-CM | POA: Diagnosis not present

## 2019-08-31 DIAGNOSIS — M25552 Pain in left hip: Secondary | ICD-10-CM | POA: Diagnosis not present

## 2019-08-31 DIAGNOSIS — M6281 Muscle weakness (generalized): Secondary | ICD-10-CM | POA: Diagnosis not present

## 2019-09-01 ENCOUNTER — Telehealth: Payer: Self-pay | Admitting: Internal Medicine

## 2019-09-01 NOTE — Telephone Encounter (Signed)
Placed in quick sign folder.  

## 2019-09-01 NOTE — Telephone Encounter (Signed)
Patient dropped off a Senior Activity form to be signed by Dr. Derrel Nip. Please fas when signed. Form is up front in Dr. Lupita Dawn color folder.

## 2019-09-05 DIAGNOSIS — E782 Mixed hyperlipidemia: Secondary | ICD-10-CM | POA: Diagnosis not present

## 2019-09-05 DIAGNOSIS — I10 Essential (primary) hypertension: Secondary | ICD-10-CM | POA: Diagnosis not present

## 2019-09-05 DIAGNOSIS — I471 Supraventricular tachycardia: Secondary | ICD-10-CM | POA: Diagnosis not present

## 2019-09-06 DIAGNOSIS — R262 Difficulty in walking, not elsewhere classified: Secondary | ICD-10-CM | POA: Diagnosis not present

## 2019-09-06 DIAGNOSIS — M6281 Muscle weakness (generalized): Secondary | ICD-10-CM | POA: Diagnosis not present

## 2019-09-06 DIAGNOSIS — M25552 Pain in left hip: Secondary | ICD-10-CM | POA: Diagnosis not present

## 2019-09-06 DIAGNOSIS — M25551 Pain in right hip: Secondary | ICD-10-CM | POA: Diagnosis not present

## 2019-09-20 ENCOUNTER — Other Ambulatory Visit: Payer: Self-pay

## 2019-09-20 ENCOUNTER — Ambulatory Visit: Payer: Medicare PPO | Admitting: Podiatry

## 2019-09-20 ENCOUNTER — Encounter: Payer: Self-pay | Admitting: Podiatry

## 2019-09-20 VITALS — Temp 97.7°F

## 2019-09-20 DIAGNOSIS — Q828 Other specified congenital malformations of skin: Secondary | ICD-10-CM | POA: Diagnosis not present

## 2019-09-20 DIAGNOSIS — M79674 Pain in right toe(s): Secondary | ICD-10-CM

## 2019-09-20 DIAGNOSIS — E119 Type 2 diabetes mellitus without complications: Secondary | ICD-10-CM | POA: Diagnosis not present

## 2019-09-20 DIAGNOSIS — B351 Tinea unguium: Secondary | ICD-10-CM

## 2019-09-20 NOTE — Progress Notes (Signed)
She presents today chief complaint of painful elongated toenails and calluses bilaterally.  Objective: Vital signs are stable she alert oriented x3.  Pulses are palpable.  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  Assessment: Debrided all reactive hyperkeratotic tissue debrided nails 1 through 5 bilateral.  Plan: Follow-up with me on an as-needed basis.

## 2019-09-26 DIAGNOSIS — M0609 Rheumatoid arthritis without rheumatoid factor, multiple sites: Secondary | ICD-10-CM | POA: Diagnosis not present

## 2019-09-26 DIAGNOSIS — Z79899 Other long term (current) drug therapy: Secondary | ICD-10-CM | POA: Diagnosis not present

## 2019-10-11 DIAGNOSIS — Z08 Encounter for follow-up examination after completed treatment for malignant neoplasm: Secondary | ICD-10-CM | POA: Diagnosis not present

## 2019-10-11 DIAGNOSIS — L821 Other seborrheic keratosis: Secondary | ICD-10-CM | POA: Diagnosis not present

## 2019-10-11 DIAGNOSIS — Z85828 Personal history of other malignant neoplasm of skin: Secondary | ICD-10-CM | POA: Diagnosis not present

## 2019-10-11 DIAGNOSIS — L559 Sunburn, unspecified: Secondary | ICD-10-CM | POA: Diagnosis not present

## 2019-11-18 ENCOUNTER — Other Ambulatory Visit: Payer: Self-pay | Admitting: Internal Medicine

## 2019-12-07 DIAGNOSIS — H9319 Tinnitus, unspecified ear: Secondary | ICD-10-CM | POA: Diagnosis not present

## 2019-12-07 DIAGNOSIS — H90A21 Sensorineural hearing loss, unilateral, right ear, with restricted hearing on the contralateral side: Secondary | ICD-10-CM | POA: Diagnosis not present

## 2019-12-07 DIAGNOSIS — H8101 Meniere's disease, right ear: Secondary | ICD-10-CM | POA: Diagnosis not present

## 2019-12-09 ENCOUNTER — Other Ambulatory Visit: Payer: Self-pay | Admitting: Internal Medicine

## 2019-12-19 DIAGNOSIS — Z20822 Contact with and (suspected) exposure to covid-19: Secondary | ICD-10-CM | POA: Diagnosis not present

## 2019-12-20 DIAGNOSIS — Z79899 Other long term (current) drug therapy: Secondary | ICD-10-CM | POA: Diagnosis not present

## 2019-12-20 DIAGNOSIS — M0609 Rheumatoid arthritis without rheumatoid factor, multiple sites: Secondary | ICD-10-CM | POA: Diagnosis not present

## 2019-12-25 DIAGNOSIS — Z20822 Contact with and (suspected) exposure to covid-19: Secondary | ICD-10-CM | POA: Diagnosis not present

## 2019-12-28 ENCOUNTER — Other Ambulatory Visit: Payer: Self-pay | Admitting: Internal Medicine

## 2020-01-02 DIAGNOSIS — Z79899 Other long term (current) drug therapy: Secondary | ICD-10-CM | POA: Diagnosis not present

## 2020-01-02 DIAGNOSIS — E119 Type 2 diabetes mellitus without complications: Secondary | ICD-10-CM | POA: Diagnosis not present

## 2020-01-02 DIAGNOSIS — M353 Polymyalgia rheumatica: Secondary | ICD-10-CM | POA: Diagnosis not present

## 2020-01-02 DIAGNOSIS — M0609 Rheumatoid arthritis without rheumatoid factor, multiple sites: Secondary | ICD-10-CM | POA: Diagnosis not present

## 2020-01-18 DIAGNOSIS — H90A21 Sensorineural hearing loss, unilateral, right ear, with restricted hearing on the contralateral side: Secondary | ICD-10-CM | POA: Diagnosis not present

## 2020-01-18 DIAGNOSIS — H9311 Tinnitus, right ear: Secondary | ICD-10-CM | POA: Diagnosis not present

## 2020-02-13 DIAGNOSIS — H9121 Sudden idiopathic hearing loss, right ear: Secondary | ICD-10-CM | POA: Diagnosis not present

## 2020-02-14 NOTE — Telephone Encounter (Signed)
Pt is following up on her message from last night. She said she took 5 glipidizes and she went to gym today and it is now 194 and she isn't having any other symptoms. She said it has never been this high. She said please call her cell phone. [phone].

## 2020-02-19 ENCOUNTER — Other Ambulatory Visit: Payer: Self-pay | Admitting: Internal Medicine

## 2020-02-22 ENCOUNTER — Other Ambulatory Visit (INDEPENDENT_AMBULATORY_CARE_PROVIDER_SITE_OTHER): Payer: Medicare PPO

## 2020-02-22 ENCOUNTER — Other Ambulatory Visit: Payer: Self-pay

## 2020-02-22 DIAGNOSIS — E1169 Type 2 diabetes mellitus with other specified complication: Secondary | ICD-10-CM | POA: Diagnosis not present

## 2020-02-22 LAB — COMPREHENSIVE METABOLIC PANEL
ALT: 17 U/L (ref 0–35)
AST: 24 U/L (ref 0–37)
Albumin: 3.9 g/dL (ref 3.5–5.2)
Alkaline Phosphatase: 89 U/L (ref 39–117)
BUN: 12 mg/dL (ref 6–23)
CO2: 28 mEq/L (ref 19–32)
Calcium: 8.9 mg/dL (ref 8.4–10.5)
Chloride: 103 mEq/L (ref 96–112)
Creatinine, Ser: 0.73 mg/dL (ref 0.40–1.20)
GFR: 81.71 mL/min (ref >60.00)
Glucose, Bld: 171 mg/dL — ABNORMAL HIGH (ref 70–99)
Potassium: 3.9 mEq/L (ref 3.5–5.1)
Sodium: 137 mEq/L (ref 135–145)
Total Bilirubin: 1 mg/dL (ref 0.2–1.2)
Total Protein: 6.6 g/dL (ref 6.0–8.3)

## 2020-02-22 LAB — MICROALBUMIN / CREATININE URINE RATIO
Creatinine,U: 123.6 mg/dL
Microalb Creat Ratio: 1 mg/g (ref 0.0–30.0)
Microalb, Ur: 1.2 mg/dL (ref 0.0–1.9)

## 2020-02-22 LAB — LIPID PANEL
Cholesterol: 119 mg/dL (ref 0–200)
HDL: 43 mg/dL (ref >39.00)
LDL Cholesterol: 57 mg/dL (ref 0–99)
NonHDL: 75.64
Total CHOL/HDL Ratio: 3
Triglycerides: 94 mg/dL (ref 0.0–149.0)
VLDL: 18.8 mg/dL (ref 0.0–40.0)

## 2020-02-22 LAB — HEMOGLOBIN A1C: Hgb A1c MFr Bld: 8.1 % — ABNORMAL HIGH (ref 4.6–6.5)

## 2020-02-25 NOTE — Progress Notes (Signed)
Attached are your recent fasting labs,  We will discuss in detail at your upcoming visit. Plesae bring your blood sugar logs with you  Regards,   Deborra Medina, MD

## 2020-02-26 ENCOUNTER — Ambulatory Visit: Payer: Medicare PPO | Admitting: Internal Medicine

## 2020-03-01 ENCOUNTER — Ambulatory Visit: Payer: Medicare PPO | Admitting: Internal Medicine

## 2020-03-01 ENCOUNTER — Other Ambulatory Visit: Payer: Self-pay

## 2020-03-01 ENCOUNTER — Encounter: Payer: Self-pay | Admitting: Internal Medicine

## 2020-03-01 VITALS — BP 130/72 | HR 68 | Temp 97.5°F | Resp 15 | Ht 66.0 in | Wt 176.4 lb

## 2020-03-01 DIAGNOSIS — E1169 Type 2 diabetes mellitus with other specified complication: Secondary | ICD-10-CM | POA: Diagnosis not present

## 2020-03-01 DIAGNOSIS — K760 Fatty (change of) liver, not elsewhere classified: Secondary | ICD-10-CM | POA: Diagnosis not present

## 2020-03-01 DIAGNOSIS — M48062 Spinal stenosis, lumbar region with neurogenic claudication: Secondary | ICD-10-CM

## 2020-03-01 DIAGNOSIS — I10 Essential (primary) hypertension: Secondary | ICD-10-CM

## 2020-03-01 DIAGNOSIS — G8929 Other chronic pain: Secondary | ICD-10-CM | POA: Diagnosis not present

## 2020-03-01 DIAGNOSIS — M545 Low back pain, unspecified: Secondary | ICD-10-CM

## 2020-03-01 DIAGNOSIS — E782 Mixed hyperlipidemia: Secondary | ICD-10-CM

## 2020-03-01 MED ORDER — METFORMIN HCL 500 MG PO TABS: 500.0000 mg | ORAL_TABLET | Freq: Two times a day (BID) | ORAL | 3 refills | Status: DC

## 2020-03-01 NOTE — Assessment & Plan Note (Signed)
Secondary to obesity and diabetes/metabolic syndrome. I have encouraged lowering of   BMI and adherence  To a low glycemic index diet utilizing smaller more frequent meals to increase metabolism.  She is walking 3 miles daily    Lab Results  Component Value Date   ALT 17 02/22/2020   AST 24 02/22/2020   ALKPHOS 89 02/22/2020   BILITOT 1.0 02/22/2020   Repeat  revaccination for Hep a and B complete

## 2020-03-01 NOTE — Progress Notes (Signed)
Subjective:  Patient ID: [name], female    DOB: Oct 02, 1945  Age: 74 y.o. MRN: [id number]  CC: The primary encounter diagnosis was Fatty liver disease, nonalcoholic. Diagnoses of Type 2 diabetes mellitus with other specified complication, without long-term current use of insulin (HCC), Chronic midline low back pain without sciatica, Spinal stenosis, lumbar region, with neurogenic claudication, Primary hypertension, and Mixed hyperlipidemia were also pertinent to this visit.  HPI [name] presents for follow up on diabetes.  Hypertension and hyperlipidemia  This visit occurred during the SARS-CoV-2 public health emergency.  Safety protocols were in place, including screening questions prior to the visit, additional usage of staff PPE, and extensive cleaning of exam room while observing appropriate contact time as indicated for disinfecting solutions.    Patient has received 3 doses of Pfizer COVID 19 vaccine without complications.  Patient continues to mask when outside of the home except when walking in yard or at safe distances from others .  Patient denies any change in mood or development of unhealthy behaviors resuting from the pandemic's restriction of activities and socialization.    Just returned from a trip to [place] last week .felt very safe bc she had to show her vaccination card to enter restaurants, all public buildings    C1EX:  She  feels generally well,  But is not  exercising regularly or trying to lose weight. Checking  blood sugars 1-2 times daily at variable times, usually only if she feels she may be having a hypoglycemic event. .  BS have been under 130 fasting and < 150 post prandially.  Denies any recent hypoglyemic events.  Taking  Glipizide 32m bid . Following a carbohydrate modified diet about 50%  Of the time (shares meals with husband DTimmothy Sourswho prefers freid seafood) . Denies numbness, burning and tingling of extremities. Appetite is good.  Has started walking  daily with husband . cbg's have been high since she was treated for tinnitus with steroid injection in the ear drum  by ENT, ([facility])   fastings 170 to 10 most recently  Postt prandials have been under  160   2) Back pain.  Her back pain localizes to the thoracic and lumbar areas.  The pain is chronic and aggravating . It Limits her tolerance for standing    Outpatient Medications Prior to Visit  Medication Sig Dispense Refill  . ACCU-CHEK AVIVA PLUS test strip USE TO CHECK BLOOD SUGAR ONCE DAILY DX: E11.65 100 strip 2  . atorvastatin (LIPITOR) 80 MG tablet TAKE 1 TABLET (80 MG TOTAL) BY MOUTH DAILY. 90 tablet 1  . blood glucose meter kit and supplies KIT Dispense based on patient and insurance preference. Use up to four times daily as directed. (FOR ICD-9 250.00, 250.01). 1 each 0  . calcium-vitamin D (OSCAL WITH D) 250-125 MG-UNIT per tablet Take 2 tablets by mouth daily.    . famotidine (PEPCID) 20 MG tablet TAKE 1 TABLET (20 MG TOTAL) BY MOUTH DAILY. 30 MINUTES BEFORE DINNER 90 tablet 1  . folic acid (FOLVITE) 0.5 MG tablet Take 0.5 mg by mouth daily.    .Marland KitchenglipiZIDE (GLUCOTROL) 5 MG tablet TAKE 1 TABLET (5 MG TOTAL) BY MOUTH 2 (TWO) TIMES DAILY BEFORE A MEAL. 180 tablet 1  . Insulin Syringe-Needle U-100 (INSULIN SYRINGE 1CC/31GX5/16") 31G X 5/16" 1 ML MISC Use to inject insulin twice daily. 100 each 1  . losartan (COZAAR) 25 MG tablet TAKE 2 TABLETS BY MOUTH EVERY DAY 180  tablet 1  . methotrexate 50 MG/2ML injection INJECT 0.8 MLS SUBCUTANEOUSLY ONCE A WEEK  1  . Methotrexate, Anti-Rheumatic, (METHOTREXATE, PF, ) Inject 0.7 mLs into the skin once a week.     . Multiple Vitamin (MULTIVITAMIN) tablet Take 1 tablet by mouth daily.    . pantoprazole (PROTONIX) 40 MG tablet TAKE 1 TABLET BY MOUTH EVERY DAY 90 tablet 1  . Psyllium (METAMUCIL FIBER PO) Take by mouth.    . vitamin C (ASCORBIC ACID) 500 MG tablet Take 500 mg by mouth daily.    . fluticasone (FLONASE) 50 MCG/ACT nasal spray       No facility-administered medications prior to visit.    Review of Systems;  Patient denies headache, fevers, malaise, unintentional weight loss, skin rash, eye pain, sinus congestion and sinus pain, sore throat, dysphagia,  hemoptysis , cough, dyspnea, wheezing, chest pain, palpitations, orthopnea, edema, abdominal pain, nausea, melena, diarrhea, constipation, flank pain, dysuria, hematuria, urinary  Frequency, nocturia, numbness, tingling, seizures,  Focal weakness, Loss of consciousness,  Tremor, insomnia, depression, anxiety, and suicidal ideation.      Objective:  BP 130/72 (BP Location: Left Arm, Patient Position: Sitting, Cuff Size: Normal)   Pulse 68   Temp (!) 97.5 F (36.4 C) (Oral)   Resp 15   Ht _0  (1.676 m)   Wt 176 lb 6.4 oz (80 kg)   SpO2 99%   BMI 28.47 kg/m   BP Readings from Last 3 Encounters:  03/01/20 130/72  08/23/19 128/68  06/22/19 118/76    Wt Readings from Last 3 Encounters:  03/01/20 176 lb 6.4 oz (80 kg)  08/23/19 172 lb 3.2 oz (78.1 kg)  06/22/19 173 lb (78.5 kg)    General appearance: alert, cooperative and appears stated age Ears: normal TM's and external ear canals both ears Throat: lips, mucosa, and tongue normal; teeth and gums normal Neck: no adenopathy, no carotid bruit, supple, symmetrical, trachea midline and thyroid not enlarged, symmetric, no tenderness/mass/nodules Back: symmetric, no curvature. ROM normal. No CVA tenderness. Lungs: clear to auscultation bilaterally Heart: regular rate and rhythm, S1, S2 normal, no murmur, click, rub or gallop Abdomen: soft, non-tender; bowel sounds normal; no masses,  no organomegaly Pulses: 2+ and symmetric Skin: Skin color, texture, turgor normal. No rashes or lesions Lymph nodes: Cervical, supraclavicular, and axillary nodes normal. Foot exam:  Nails are well trimmed,  No callouses,  Sensation intact to microfilament   Lab Results  Component Value Date   HGBA1C 8.1 (H) 02/22/2020    HGBA1C 7.3 (H) 08/21/2019   HGBA1C 7.3 (H) 05/22/2019    Lab Results  Component Value Date   CREATININE 0.73 02/22/2020   CREATININE 0.75 08/21/2019   CREATININE 0.72 05/22/2019    Lab Results  Component Value Date   WBC 6.5 01/05/2019   HGB 14.4 01/05/2019   HCT 43.1 01/05/2019   PLT 213 01/05/2019   GLUCOSE 171 (H) 02/22/2020   CHOL 119 02/22/2020   TRIG 94.0 02/22/2020   HDL 43.00 02/22/2020   LDLDIRECT 81 07/29/2017   LDLCALC 57 02/22/2020   ALT 17 02/22/2020   AST 24 02/22/2020   NA 137 02/22/2020   K 3.9 02/22/2020   CL 103 02/22/2020   CREATININE 0.73 02/22/2020   BUN 12 02/22/2020   CO2 28 02/22/2020   TSH 2.34 12/24/2016   HGBA1C 8.1 (H) 02/22/2020   MICROALBUR 1.2 02/22/2020    No results found.  Assessment & Plan:   Problem List Items Addressed This  Visit      Unprioritized   Fatty liver disease, nonalcoholic - Primary    Secondary to obesity and diabetes/metabolic syndrome. I have encouraged lowering of   BMI and adherence  To a low glycemic index diet utilizing smaller more frequent meals to increase metabolism.  She is walking 3 miles daily    Lab Results  Component Value Date   ALT 17 02/22/2020   AST 24 02/22/2020   ALKPHOS 89 02/22/2020   BILITOT 1.0 02/22/2020   Repeat  revaccination for Hep a and B complete       Relevant Orders   Comprehensive metabolic panel   Hyperlipidemia    LDL and triglycerides are at goal on atorvastatin  80 mg daily .  She  has no side effects and liver enzymes are normal. No changes today.  Lab Results  Component Value Date   CHOL 119 02/22/2020   HDL 43.00 02/22/2020   LDLCALC 57 02/22/2020   LDLDIRECT 81 07/29/2017   TRIG 94.0 02/22/2020   CHOLHDL 3 02/22/2020   Lab Results  Component Value Date   ALT 17 02/22/2020   AST 24 02/22/2020   ALKPHOS 89 02/22/2020   BILITOT 1.0 02/22/2020              Hypertension    Well controlled on current regimen of losartan 25 mg daily.  Renal  function stable, no changes today.  Lab Results  Component Value Date   CREATININE 0.73 02/22/2020   Lab Results  Component Value Date   NA 137 02/22/2020   K 3.9 02/22/2020   CL 103 02/22/2020   CO2 28 02/22/2020         Low back pain    Worsening,  Can't stand for more than 15 to 20 minutes with out tiring.  Extensio exercises demonstrated       Spinal stenosis, lumbar region, with neurogenic claudication    With episodic back pain that may iimprove with back extension exercises.       Type 2 diabetes mellitus with other specified complication (HCC)    Loss of control noted.  Diet reviewed.  Exercise reviewed. resume metformin 500 mg bid,  continue glipizide 5 mg bid .  She has no nephropathy.  Lab Results  Component Value Date   HGBA1C 8.1 (H) 02/22/2020   Lab Results  Component Value Date   MICROALBUR 1.2 02/22/2020   MICROALBUR <0.7 08/21/2019           Relevant Medications   metFORMIN (GLUCOPHAGE) 500 MG tablet   Other Relevant Orders   Hemoglobin A1c      I have discontinued Loreen Freud. Farha's fluticasone. I am also having her maintain her vitamin C, multivitamin, calcium-vitamin D, blood glucose meter kit and supplies, (Methotrexate, Anti-Rheumatic, (METHOTREXATE, PF, [place])), Psyllium (METAMUCIL FIBER PO), folic acid, methotrexate, INSULIN SYRINGE 1CC/31GX5/16", famotidine, atorvastatin, glipiZIDE, losartan, pantoprazole, Accu-Chek Aviva Plus, and metFORMIN.  Meds ordered this encounter  Medications  . DISCONTD: metFORMIN (GLUCOPHAGE) 500 MG tablet    Sig: Take 1 tablet (500 mg total) by mouth 2 (two) times daily with a meal.    Dispense:  180 tablet    Refill:  3  . metFORMIN (GLUCOPHAGE) 500 MG tablet    Sig: Take 1 tablet (500 mg total) by mouth 2 (two) times daily with a meal.    Dispense:  180 tablet    Refill:  3    I provided  30 minutes of  face-to-face time during  this encounter reviewing patient's current problems and past surgeries, labs and  imaging studies, providing counseling on the above mentioned problems , and coordination  of care .   Medications Discontinued During This Encounter  Medication Reason  . fluticasone (FLONASE) 50 MCG/ACT nasal spray   . metFORMIN (GLUCOPHAGE) 500 MG tablet     Follow-up: Return in about 3 months (around 06/01/2020) for follow up diabetes.   Crecencio Mc, MD

## 2020-03-01 NOTE — Assessment & Plan Note (Signed)
Worsening,  Can't stand for more than 15 to 20 minutes with out tiring.  Extensio exercises demonstrated

## 2020-03-01 NOTE — Patient Instructions (Signed)
Continue glipizide at 5 mg twice daily before breakfast and evening meal  Resume metformin 500 mg twice daily  Same time as glipizide  Send readings in 1 month  Medication changes to follow     Exercise: (supported back extension, standing)  Stand against a counter or sofa with your buttocks resting on the edge and your hands on the edge as well on either side of your back  Slowly lean backwards,  Bending from the waist, until you feel slight discomfort. Restore yourself to vertical position (up straight) Repeat the back extension 10 times , each time extending a little farther).  What should you expect? The pain should recede from the calf/thigh/buttocks but may localize to the lower back  If it does not,  Or if it makes the leg pain worse, STOP doing it.   If it results in improvement,  Repeat the exercise every 2-3 hours while awake and STOP THE OTHER EXERCISES that do the opposite motion (back flexion)

## 2020-03-01 NOTE — Assessment & Plan Note (Addendum)
Loss of control noted.  Diet reviewed.  Exercise reviewed. resume metformin 500 mg bid,  continue glipizide 5 mg bid .  She has no nephropathy.  Lab Results  Component Value Date   HGBA1C 8.1 (H) 02/22/2020   Lab Results  Component Value Date   MICROALBUR 1.2 02/22/2020   MICROALBUR <0.7 08/21/2019

## 2020-03-02 NOTE — Assessment & Plan Note (Signed)
Well controlled on current regimen of losartan 25 mg daily.  Renal function stable, no changes today.  Lab Results  Component Value Date   CREATININE 0.73 02/22/2020   Lab Results  Component Value Date   NA 137 02/22/2020   K 3.9 02/22/2020   CL 103 02/22/2020   CO2 28 02/22/2020

## 2020-03-02 NOTE — Assessment & Plan Note (Signed)
LDL and triglycerides are at goal on atorvastatin  80 mg daily .  She  has no side effects and liver enzymes are normal. No changes today.  Lab Results  Component Value Date   CHOL 119 02/22/2020   HDL 43.00 02/22/2020   LDLCALC 57 02/22/2020   LDLDIRECT 81 07/29/2017   TRIG 94.0 02/22/2020   CHOLHDL 3 02/22/2020   Lab Results  Component Value Date   ALT 17 02/22/2020   AST 24 02/22/2020   ALKPHOS 89 02/22/2020   BILITOT 1.0 02/22/2020

## 2020-03-02 NOTE — Assessment & Plan Note (Addendum)
With episodic back pain that may iimprove with back extension exercises.

## 2020-03-04 ENCOUNTER — Telehealth: Payer: Self-pay | Admitting: Internal Medicine

## 2020-03-04 MED ORDER — METFORMIN HCL 500 MG PO TABS
500.0000 mg | ORAL_TABLET | Freq: Two times a day (BID) | ORAL | 3 refills | Status: DC
Start: 2020-03-04 — End: 2020-05-31

## 2020-03-04 NOTE — Telephone Encounter (Signed)
Medication has been resent electronically.

## 2020-03-04 NOTE — Telephone Encounter (Signed)
Patient saw Dr. Derrel Nip on 03/01/20. Dr. Derrel Nip was putting patient on metFORMIN (GLUCOPHAGE) 500 MG tablet. Patient never received the prescription and neither has her pharmacy. Please send to her pharmacy, [facility] on [address] near [place], in [place].

## 2020-04-03 ENCOUNTER — Telehealth: Payer: Self-pay | Admitting: Internal Medicine

## 2020-04-03 DIAGNOSIS — M0609 Rheumatoid arthritis without rheumatoid factor, multiple sites: Secondary | ICD-10-CM | POA: Diagnosis not present

## 2020-04-03 DIAGNOSIS — Z79899 Other long term (current) drug therapy: Secondary | ICD-10-CM | POA: Diagnosis not present

## 2020-04-03 NOTE — Telephone Encounter (Signed)
Placed in yellow results folder.  °

## 2020-04-03 NOTE — Telephone Encounter (Signed)
Pt dropped off blood Sugar reading  Placed in Dr. Lupita Dawn color folder upfront

## 2020-04-15 ENCOUNTER — Telehealth: Payer: Self-pay | Admitting: Internal Medicine

## 2020-04-15 DIAGNOSIS — E1169 Type 2 diabetes mellitus with other specified complication: Secondary | ICD-10-CM

## 2020-04-15 NOTE — Telephone Encounter (Signed)
BS have improved with use of metformin ,  But I would reduce the dinnertime dose of glipizide to 2.5 mg since you had  At least 5 lows after dinner  (MyChart message sent )

## 2020-04-15 NOTE — Assessment & Plan Note (Signed)
BS have improved with use of metformin ,  But I advised her to  reduce the dinnertime dose of glipizide to 2.5 mg since  She has  had  At least 5 lows after dinner

## 2020-05-01 ENCOUNTER — Ambulatory Visit (INDEPENDENT_AMBULATORY_CARE_PROVIDER_SITE_OTHER): Payer: Medicare PPO

## 2020-05-01 VITALS — Ht 66.0 in | Wt 176.0 lb

## 2020-05-01 DIAGNOSIS — Z0000 Encounter for general adult medical examination without abnormal findings: Secondary | ICD-10-CM | POA: Diagnosis not present

## 2020-05-01 NOTE — Patient Instructions (Addendum)
[name] , Thank you for taking time to come for your Medicare Wellness Visit. I appreciate your ongoing commitment to your health goals. Please review the following plan we discussed and let me know if I can assist you in the future.   These are the goals we discussed: Goals     Peak Blood Glucose < 180       This is a list of the screening recommended for you and due dates:  Health Maintenance  Topic Date Due   Eye exam for diabetics  05/29/2020   Mammogram  08/03/2020   Complete foot exam   08/22/2020   Hemoglobin A1C  08/22/2020   Tetanus Vaccine  08/09/2023   Colon Cancer Screening  02/03/2024   Flu Shot  Completed   DEXA scan (bone density measurement)  Completed   COVID-19 Vaccine  Completed    Hepatitis C: One time screening is recommended by Center for Disease Control  (CDC) for  adults born from [birth year] through 1964.   Completed   Pneumonia vaccines  Completed    Immunizations Immunization History  Administered Date(s) Administered   Hep A / Hep B 01/01/2016, 02/04/2016, 07/03/2016   Influenza Split 03/03/2012, 01/26/2013, 02/25/2015   Influenza, High Dose Seasonal PF 02/20/2016, 01/31/2018, 01/08/2019   Influenza,inj,Quad PF,6+ Mos 02/13/2014   Influenza-Unspecified 02/19/2017, 02/13/2020   PFIZER SARS-COV-2 Vaccination 06/09/2019, 07/01/2019, 02/13/2020   Pneumococcal Conjugate-13 11/08/2013, 01/08/2019   Pneumococcal Polysaccharide-23 03/03/2012   Tdap 08/08/2013   Zoster 04/20/2008   Zoster Recombinat (Shingrix) 03/02/2018, 06/13/2018   Keep all routine maintenance appointments.   Follow up 05/31/20 @ 9:30  Advanced directives: End of life planning; Advance aging; Advanced directives discussed.  Copy of current HCPOA/Living Will requested.    Conditions/risks identified: None new.   Follow up in one year for your annual wellness visit.   Preventive Care 75 Years and Older, Female Preventive care refers to lifestyle choices and  visits with your health care provider that can promote health and wellness. What does preventive care include?  A yearly physical exam. This is also called an annual well check.  Dental exams once or twice a year.  Routine eye exams. Ask your health care provider how often you should have your eyes checked.  Personal lifestyle choices, including:  Daily care of your teeth and gums.  Regular physical activity.  Eating a healthy diet.  Avoiding tobacco and drug use.  Limiting alcohol use.  Practicing safe sex.  Taking low-dose aspirin every day.  Taking vitamin and mineral supplements as recommended by your health care provider. What happens during an annual well check? The services and screenings done by your health care provider during your annual well check will depend on your age, overall health, lifestyle risk factors, and family history of disease. Counseling  Your health care provider may ask you questions about your:  Alcohol use.  Tobacco use.  Drug use.  Emotional well-being.  Home and relationship well-being.  Sexual activity.  Eating habits.  History of falls.  Memory and ability to understand (cognition).  Work and work Statistician.  Reproductive health. Screening  You may have the following tests or measurements:  Height, weight, and BMI.  Blood pressure.  Lipid and cholesterol levels. These may be checked every 5 years, or more frequently if you are over 67 years old.  Skin check.  Lung cancer screening. You may have this screening every year starting at age 56 if you have a 30-pack-year history of smoking  and currently smoke or have quit within the past 15 years.  Fecal occult blood test (FOBT) of the stool. You may have this test every year starting at age 46.  Flexible sigmoidoscopy or colonoscopy. You may have a sigmoidoscopy every 5 years or a colonoscopy every 10 years starting at age 36.  Hepatitis C blood test.  Hepatitis B  blood test.  Sexually transmitted disease (STD) testing.  Diabetes screening. This is done by checking your blood sugar (glucose) after you have not eaten for a while (fasting). You may have this done every 1-3 years.  Bone density scan. This is done to screen for osteoporosis. You may have this done starting at age 33.  Mammogram. This may be done every 1-2 years. Talk to your health care provider about how often you should have regular mammograms. Talk with your health care provider about your test results, treatment options, and if necessary, the need for more tests. Vaccines  Your health care provider may recommend certain vaccines, such as:  Influenza vaccine. This is recommended every year.  Tetanus, diphtheria, and acellular pertussis (Tdap, Td) vaccine. You may need a Td booster every 10 years.  Zoster vaccine. You may need this after age 8.  Pneumococcal 13-valent conjugate (PCV13) vaccine. One dose is recommended after age 30.  Pneumococcal polysaccharide (PPSV23) vaccine. One dose is recommended after age 36. Talk to your health care provider about which screenings and vaccines you need and how often you need them. This information is not intended to replace advice given to you by your health care provider. Make sure you discuss any questions you have with your health care provider. Document Released: 05/31/2015 Document Revised: 01/22/2016 Document Reviewed: 03/05/2015 Elsevier Interactive Patient Education  2017 [employer] Prevention in the Home Falls can cause injuries. They can happen to people of all ages. There are many things you can do to make your home safe and to help prevent falls. What can I do on the outside of my home?  Regularly fix the edges of walkways and driveways and fix any cracks.  Remove anything that might make you trip as you walk through a door, such as a raised step or threshold.  Trim any bushes or trees on the path to your  home.  Use bright outdoor lighting.  Clear any walking paths of anything that might make someone trip, such as rocks or tools.  Regularly check to see if handrails are loose or broken. Make sure that both sides of any steps have handrails.  Any raised decks and porches should have guardrails on the edges.  Have any leaves, snow, or ice cleared regularly.  Use sand or salt on walking paths during winter.  Clean up any spills in your garage right away. This includes oil or grease spills. What can I do in the bathroom?  Use night lights.  Install grab bars by the toilet and in the tub and shower. Do not use towel bars as grab bars.  Use non-skid mats or decals in the tub or shower.  If you need to sit down in the shower, use a plastic, non-slip stool.  Keep the floor dry. Clean up any water that spills on the floor as soon as it happens.  Remove soap buildup in the tub or shower regularly.  Attach bath mats securely with double-sided non-slip rug tape.  Do not have throw rugs and other things on the floor that can make you trip. What can I  do in the bedroom?  Use night lights.  Make sure that you have a light by your bed that is easy to reach.  Do not use any sheets or blankets that are too big for your bed. They should not hang down onto the floor.  Have a firm chair that has side arms. You can use this for support while you get dressed.  Do not have throw rugs and other things on the floor that can make you trip. What can I do in the kitchen?  Clean up any spills right away.  Avoid walking on wet floors.  Keep items that you use a lot in easy-to-reach places.  If you need to reach something above you, use a strong step stool that has a grab bar.  Keep electrical cords out of the way.  Do not use floor polish or wax that makes floors slippery. If you must use wax, use non-skid floor wax.  Do not have throw rugs and other things on the floor that can make you  trip. What can I do with my stairs?  Do not leave any items on the stairs.  Make sure that there are handrails on both sides of the stairs and use them. Fix handrails that are broken or loose. Make sure that handrails are as long as the stairways.  Check any carpeting to make sure that it is firmly attached to the stairs. Fix any carpet that is loose or worn.  Avoid having throw rugs at the top or bottom of the stairs. If you do have throw rugs, attach them to the floor with carpet tape.  Make sure that you have a light switch at the top of the stairs and the bottom of the stairs. If you do not have them, ask someone to add them for you. What else can I do to help prevent falls?  Wear shoes that:  Do not have high heels.  Have rubber bottoms.  Are comfortable and fit you well.  Are closed at the toe. Do not wear sandals.  If you use a stepladder:  Make sure that it is fully opened. Do not climb a closed stepladder.  Make sure that both sides of the stepladder are locked into place.  Ask someone to hold it for you, if possible.  Clearly mark and make sure that you can see:  Any grab bars or handrails.  First and last steps.  Where the edge of each step is.  Use tools that help you move around (mobility aids) if they are needed. These include:  Canes.  Walkers.  Scooters.  Crutches.  Turn on the lights when you go into a dark area. Replace any light bulbs as soon as they burn out.  Set up your furniture so you have a clear path. Avoid moving your furniture around.  If any of your floors are uneven, fix them.  If there are any pets around you, be aware of where they are.  Review your medicines with your doctor. Some medicines can make you feel dizzy. This can increase your chance of falling. Ask your doctor what other things that you can do to help prevent falls. This information is not intended to replace advice given to you by your health care provider. Make  sure you discuss any questions you have with your health care provider. Document Released: 02/28/2009 Document Revised: 10/10/2015 Document Reviewed: 06/08/2014 Elsevier Interactive Patient Education  2017 [employer].

## 2020-05-01 NOTE — Progress Notes (Addendum)
Subjective:   [name] is a 74 y.o. female who presents for Medicare Annual (Subsequent) preventive examination.  Review of Systems    No ROS.  Medicare Wellness Virtual Visit.     Cardiac Risk Factors include: advanced age (>80mn, >>60women)     Objective:    Today's Vitals   05/01/20 1104  Weight: 176 lb (79.8 kg)  Height: 5' 6"  (1.676 m)   Body mass index is 28.41 kg/m.  Advanced Directives 05/01/2020 05/01/2019 04/28/2018 04/13/2017 01/01/2016 04/01/2015 11/05/2014  Does Patient Have a Medical Advance Directive? Yes No No No No No No  Type of [place] - - - - - -  Does patient want to make changes to medical advance directive? No - Patient declined - - - - - -  Copy of [place]? No - copy requested - - - - - -  Would patient like information on creating a medical advance directive? - No - Guardian declined No - Patient declined No - Patient declined No - patient declined information Yes - Educational materials given Yes - Educational materials given    Current Medications (verified) Outpatient Encounter Medications as of 05/01/2020  Medication Sig   ACCU-CHEK AVIVA PLUS test strip USE TO CHECK BLOOD SUGAR ONCE DAILY DX: E11.65   atorvastatin (LIPITOR) 80 MG tablet TAKE 1 TABLET (80 MG TOTAL) BY MOUTH DAILY.   blood glucose meter kit and supplies KIT Dispense based on patient and insurance preference. Use up to four times daily as directed. (FOR ICD-9 250.00, 250.01).   calcium-vitamin D (OSCAL WITH D) 250-125 MG-UNIT per tablet Take 2 tablets by mouth daily.   famotidine (PEPCID) 20 MG tablet TAKE 1 TABLET (20 MG TOTAL) BY MOUTH DAILY. 30 MINUTES BEFORE DINNER   folic acid (FOLVITE) 0.5 MG tablet Take 0.5 mg by mouth daily.   glipiZIDE (GLUCOTROL) 5 MG tablet TAKE 1 TABLET (5 MG TOTAL) BY MOUTH 2 (TWO) TIMES DAILY BEFORE A MEAL.   Insulin Syringe-Needle U-100 (INSULIN SYRINGE  1CC/31GX5/16") 31G X 5/16" 1 ML MISC Use to inject insulin twice daily.   losartan (COZAAR) 25 MG tablet TAKE 2 TABLETS BY MOUTH EVERY DAY   metFORMIN (GLUCOPHAGE) 500 MG tablet Take 1 tablet (500 mg total) by mouth 2 (two) times daily with a meal.   methotrexate 50 MG/2ML injection INJECT 0.8 MLS SUBCUTANEOUSLY ONCE A WEEK   Methotrexate, Anti-Rheumatic, (METHOTREXATE, PF, [place]) Inject 0.7 mLs into the skin once a week.    Multiple Vitamin (MULTIVITAMIN) tablet Take 1 tablet by mouth daily.   pantoprazole (PROTONIX) 40 MG tablet TAKE 1 TABLET BY MOUTH EVERY DAY   Psyllium (METAMUCIL FIBER PO) Take by mouth.   vitamin C (ASCORBIC ACID) 500 MG tablet Take 500 mg by mouth daily.   [DISCONTINUED] ferrous sulfate 325 (65 FE) MG tablet Take 325 mg by mouth daily with breakfast.     [DISCONTINUED] omeprazole (PRILOSEC) 40 MG capsule Take 1 capsule (40 mg total) by mouth daily.   No facility-administered encounter medications on file as of 05/01/2020.    Allergies (verified) Amoxicillin and Latex   History: Past Medical History:  Diagnosis Date   Diabetes mellitus    Gastritis    High cholesterol    Hyperlipidemia    Lipitor trial was a failure   Hypertension    Past Surgical History:  Procedure Laterality Date   CARDIAC CATHETERIZATION  Augus 2007   normal coronaries,  EF 60%   SPINE SURGERY  1995   SPINE SURGERY     bone graft    TUBAL LIGATION     Family History  Problem Relation Age of Onset   Diabetes Mother    Heart disease Father    Stroke Father    Diabetes Father    Diabetes Maternal Grandfather    Diabetes Brother    Diabetes Brother    Breast cancer Neg Hx    Social History   Socioeconomic History   Marital status: Married    Spouse name: Not on file   Number of children: Not on file   Years of education: Not on file   Highest education level: Not on file  Occupational History   Occupation: Programmer, multimedia: [employer]    Comment: Retired 2014  Tobacco Use    Smoking status: Never Smoker   Smokeless tobacco: Never Used  Scientific laboratory technician Use: Never used  Substance and Sexual Activity   Alcohol use: No   Drug use: No   Sexual activity: Yes    Partners: Male    Birth control/protection: None  Other Topics Concern   Not on file  Social History Narrative   Not on file   Social Determinants of Health   Financial Resource Strain: Low Risk    Difficulty of Paying Living Expenses: Not hard at all  Food Insecurity: No Food Insecurity   Worried About Charity fundraiser in the Last Year: Never true   Arboriculturist in the Last Year: Never true  Transportation Needs: No Transportation Needs   Lack of Transportation (Medical): No   Lack of Transportation (Non-Medical): No  Physical Activity: Not on file  Stress: No Stress Concern Present   Feeling of Stress : Not at all  Social Connections: Unknown   Frequency of Communication with Friends and Family: Not on file   Frequency of Social Gatherings with Friends and Family: Not on file   Attends Religious Services: Not on file   Active Member of Clubs or Organizations: Not on file   Attends Archivist Meetings: Not on file   Marital Status: Married    Tobacco Counseling Counseling given: Not Answered   Clinical Intake:  Pre-visit preparation completed: Yes        Diabetes: Yes  How often do you need to have someone help you when you read instructions, pamphlets, or other written materials from your doctor or pharmacy?: 1 - Never  Interpreter Needed?: No      Activities of Daily Living In your present state of health, do you have any difficulty performing the following activities: 05/01/2020  Hearing? N  Vision? N  Difficulty concentrating or making decisions? N  Walking or climbing stairs? N  Dressing or bathing? N  Doing errands, shopping? N  Preparing Food and eating ? N  Using the Toilet? N  In the past six months, have you accidently leaked urine? N   Do you have problems with loss of bowel control? N  Comment Normal to pencil like stool. Deferred to PCP for follow up.  Managing your Medications? N  Managing your Finances? N  Housekeeping or managing your Housekeeping? N  Some recent data might be hidden    Patient Care Team: Crecencio Mc, MD as PCP - General (Internal Medicine) Emmaline Kluver., MD (Rheumatology) Osborne Oman, MD as Consulting Physician (Obstetrics and Gynecology)  Indicate any recent  Medical Services you may have received from other than [facility] providers in the past year (date may be approximate).     Assessment:   This is a routine wellness examination for [place].  I connected with Meg today by telephone and verified that I am speaking with the correct person using two identifiers. Location patient: home Location provider: work Persons participating in the virtual visit: patient, Marine scientist.    I discussed the limitations, risks, security and privacy concerns of performing an evaluation and management service by telephone and the availability of in person appointments. The patient expressed understanding and verbally consented to this telephonic visit.    Interactive audio and video telecommunications were attempted between this provider and patient, however failed, due to patient having technical difficulties OR patient did not have access to video capability.  We continued and completed visit with audio only.  Some vital signs may be absent or patient reported.   Hearing/Vision screen  Hearing Screening   125Hz  250Hz  500Hz  1000Hz  2000Hz  3000Hz  4000Hz  6000Hz  8000Hz   Right ear:           Left ear:           Comments: Patient is able to hear conversational tones without difficulty.  No issues reported.   Vision Screening Comments: Followed by [facility]  No retinopathy  Visual acuity not assessed per patient preference since they have regular follow up with the ophthalmologist  Dietary  issues and exercise activities discussed: Current Exercise Habits: Home exercise routine, Type of exercise: walking;calisthenics, Time (Minutes): 60, Frequency (Times/Week): 2, Weekly Exercise (Minutes/Week): 120, Intensity: Mild  Goals      Peak Blood Glucose < 180       Depression Screen PHQ 2/9 Scores 05/01/2020 05/01/2019 04/28/2018 04/13/2017 03/12/2017 01/01/2016 09/12/2014  PHQ - 2 Score 0 0 0 0 3 0 1  PHQ- 9 Score - - - 0 10 - -    Fall Risk Fall Risk  05/01/2020 03/01/2020 08/23/2019 05/01/2019 01/13/2019  Falls in the past year? 0 0 0 0 0  Number falls in past yr: 0 - - - -  Injury with Fall? 0 - - - -  Follow up Falls evaluation completed Falls evaluation completed Falls evaluation completed Falls prevention discussed Falls evaluation completed    [place]: Handrails in use when climbing stairs? Yes Home free of loose throw rugs in walkways, pet beds, electrical cords, etc? Yes  Adequate lighting in your home to reduce risk of falls? Yes   ASSISTIVE DEVICES UTILIZED TO PREVENT FALLS: Use of a cane, walker or w/c? No   TIMED UP AND GO:  Was the test performed? No . Virtual visit.   Cognitive Function: Patient is alert and oriented x3.  Denies difficulty focusing, making decisions, memory loss.  Enjoys brain challenging games.  MMSE/6CIT deferred. Normal by direct communication/observation.  MMSE - Mini Mental State Exam 04/13/2017 01/01/2016 09/12/2014  Orientation to time 5 5 5   Orientation to Place 5 5 5   Registration 3 3 3   Attention/ Calculation 5 5 5   Recall 2 3 3   Language- name 2 objects 2 2 2   Language- repeat 1 1 1   Language- follow 3 step command 3 3 3   Language- read & follow direction 1 1 1   Write a sentence 1 1 1   Copy design 1 1 1   Total score 29 30 30      6CIT Screen 05/01/2019 04/28/2018  What Year? 0 points 0  points  What month? 0 points 0 points  What time? 0 points 0 points  Count back from 20 0 points  0 points  Months in reverse 0 points 0 points  Repeat phrase 0 points -  Total Score 0 -    Immunizations Immunization History  Administered Date(s) Administered   Hep A / Hep B 01/01/2016, 02/04/2016, 07/03/2016   Influenza Split 03/03/2012, 01/26/2013, 02/25/2015   Influenza, High Dose Seasonal PF 02/20/2016, 01/31/2018, 01/08/2019   Influenza,inj,Quad PF,6+ Mos 02/13/2014   Influenza-Unspecified 02/19/2017, 02/13/2020   PFIZER SARS-COV-2 Vaccination 06/09/2019, 07/01/2019, 02/13/2020   Pneumococcal Conjugate-13 11/08/2013, 01/08/2019   Pneumococcal Polysaccharide-23 03/03/2012   Tdap 08/08/2013   Zoster 04/20/2008   Zoster Recombinat (Shingrix) 03/02/2018, 06/13/2018   Health Maintenance There are no preventive care reminders to display for this patient. Health Maintenance  Topic Date Due   OPHTHALMOLOGY EXAM  05/29/2020   MAMMOGRAM  08/03/2020   FOOT EXAM  08/22/2020   HEMOGLOBIN A1C  08/22/2020   TETANUS/TDAP  08/09/2023   COLONOSCOPY  02/03/2024   INFLUENZA VACCINE  Completed   DEXA SCAN  Completed   COVID-19 Vaccine  Completed   Hepatitis C Screening  Completed   PNA vac Low Risk Adult  Completed   Colorectal cancer screening: Type of screening: Colonoscopy. Completed 01/23/14. Repeat every 10 years.  Mammogram status: Completed 08/04/19. Repeat every year. 3D screen Bilateral.  Bone Density- 06/02/17. Normal.   calcium-vitamin D (OSCAL WITH D) 250-125 MG-UNIT per tablet     Lung Cancer Screening: (Low Dose CT Chest recommended if Age 47-80 years, 30 pack-year currently smoking OR have quit w/in 15years.) does not qualify.   Hepatitis C Screening: Completed 07/29/17.  Vision Screening: Recommended annual ophthalmology exams for early detection of glaucoma and other disorders of the eye. Is the patient up to date with their annual eye exam?  Yes  Who is the provider or what is the name of the office in which the patient attends annual eye exams? [facility].    Dental Screening: Recommended annual dental exams for proper oral hygiene  Community Resource Referral / Chronic Care Management: CRR required this visit?  No   CCM required this visit?  No      Plan:   Keep all routine maintenance appointments.   Follow up 05/31/20 @ 9:30  I have personally reviewed and noted the following in the patient's chart:   Medical and social history Use of alcohol, tobacco or illicit drugs  Current medications and supplements Functional ability and status Nutritional status Physical activity Advanced directives List of other physicians Hospitalizations, surgeries, and ER visits in previous 12 months Vitals Screenings to include cognitive, depression, and falls Referrals and appointments  In addition, I have reviewed and discussed with patient certain preventive protocols, quality metrics, and best practice recommendations. A written personalized care plan for preventive services as well as general preventive health recommendations were provided to patient via mychart.     OBrien-Blaney, Parvin Stetzer L, LPN   [id number]     I have reviewed the above information and agree with above.   Deborra Medina, MD

## 2020-05-06 ENCOUNTER — Encounter: Payer: Self-pay | Admitting: Radiology

## 2020-05-13 ENCOUNTER — Other Ambulatory Visit: Payer: Self-pay | Admitting: Internal Medicine

## 2020-05-23 ENCOUNTER — Other Ambulatory Visit: Payer: Self-pay

## 2020-05-23 ENCOUNTER — Other Ambulatory Visit (INDEPENDENT_AMBULATORY_CARE_PROVIDER_SITE_OTHER): Payer: Medicare PPO

## 2020-05-23 DIAGNOSIS — K760 Fatty (change of) liver, not elsewhere classified: Secondary | ICD-10-CM

## 2020-05-23 DIAGNOSIS — E1169 Type 2 diabetes mellitus with other specified complication: Secondary | ICD-10-CM | POA: Diagnosis not present

## 2020-05-23 LAB — COMPREHENSIVE METABOLIC PANEL WITH GFR
ALT: 24 U/L (ref 0–35)
AST: 32 U/L (ref 0–37)
Albumin: 4.2 g/dL (ref 3.5–5.2)
Alkaline Phosphatase: 100 U/L (ref 39–117)
BUN: 12 mg/dL (ref 6–23)
CO2: 29 meq/L (ref 19–32)
Calcium: 9.4 mg/dL (ref 8.4–10.5)
Chloride: 101 meq/L (ref 96–112)
Creatinine, Ser: 0.8 mg/dL (ref 0.40–1.20)
GFR: 73.19 mL/min
Glucose, Bld: 182 mg/dL — ABNORMAL HIGH (ref 70–99)
Potassium: 4.3 meq/L (ref 3.5–5.1)
Sodium: 137 meq/L (ref 135–145)
Total Bilirubin: 1.1 mg/dL (ref 0.2–1.2)
Total Protein: 6.9 g/dL (ref 6.0–8.3)

## 2020-05-23 LAB — HEMOGLOBIN A1C: Hgb A1c MFr Bld: 7.5 % — ABNORMAL HIGH (ref 4.6–6.5)

## 2020-05-31 ENCOUNTER — Telehealth: Payer: Self-pay

## 2020-05-31 ENCOUNTER — Ambulatory Visit (INDEPENDENT_AMBULATORY_CARE_PROVIDER_SITE_OTHER): Payer: Medicare PPO

## 2020-05-31 ENCOUNTER — Other Ambulatory Visit: Payer: Self-pay

## 2020-05-31 ENCOUNTER — Encounter: Payer: Self-pay | Admitting: Internal Medicine

## 2020-05-31 ENCOUNTER — Ambulatory Visit (INDEPENDENT_AMBULATORY_CARE_PROVIDER_SITE_OTHER): Payer: Medicare PPO | Admitting: Internal Medicine

## 2020-05-31 VITALS — BP 126/74 | HR 83 | Temp 97.4°F | Ht 65.98 in | Wt 181.4 lb

## 2020-05-31 DIAGNOSIS — E1169 Type 2 diabetes mellitus with other specified complication: Secondary | ICD-10-CM | POA: Diagnosis not present

## 2020-05-31 DIAGNOSIS — K760 Fatty (change of) liver, not elsewhere classified: Secondary | ICD-10-CM

## 2020-05-31 DIAGNOSIS — R140 Abdominal distension (gaseous): Secondary | ICD-10-CM | POA: Diagnosis not present

## 2020-05-31 DIAGNOSIS — Z981 Arthrodesis status: Secondary | ICD-10-CM | POA: Diagnosis not present

## 2020-05-31 DIAGNOSIS — K5909 Other constipation: Secondary | ICD-10-CM | POA: Diagnosis not present

## 2020-05-31 DIAGNOSIS — R5383 Other fatigue: Secondary | ICD-10-CM

## 2020-05-31 LAB — CBC WITH DIFFERENTIAL/PLATELET
Basophils Absolute: 0 10*3/uL (ref 0.0–0.1)
Basophils Relative: 0.7 % (ref 0.0–3.0)
Eosinophils Absolute: 0.1 10*3/uL (ref 0.0–0.7)
Eosinophils Relative: 1.3 % (ref 0.0–5.0)
HCT: 42.2 % (ref 36.0–46.0)
Hemoglobin: 14.2 g/dL (ref 12.0–15.0)
Lymphocytes Relative: 26.6 % (ref 12.0–46.0)
Lymphs Abs: 1.2 10*3/uL (ref 0.7–4.0)
MCHC: 33.6 g/dL (ref 30.0–36.0)
MCV: 93 fl (ref 78.0–100.0)
Monocytes Absolute: 0.3 10*3/uL (ref 0.1–1.0)
Monocytes Relative: 6.1 % (ref 3.0–12.0)
Neutro Abs: 2.9 10*3/uL (ref 1.4–7.7)
Neutrophils Relative %: 65.3 % (ref 43.0–77.0)
Platelets: 230 10*3/uL (ref 150.0–400.0)
RBC: 4.54 Mil/uL (ref 3.87–5.11)
RDW: 14.2 % (ref 11.5–15.5)
WBC: 4.5 10*3/uL (ref 4.0–10.5)

## 2020-05-31 LAB — VITAMIN B12: Vitamin B-12: 663 pg/mL (ref 211–911)

## 2020-05-31 MED ORDER — METFORMIN HCL 850 MG PO TABS
850.0000 mg | ORAL_TABLET | Freq: Two times a day (BID) | ORAL | 0 refills | Status: DC
Start: 2020-05-31 — End: 2020-07-18

## 2020-05-31 NOTE — Patient Instructions (Addendum)
Increase the metformin to 850 mg two times daily  Keep a diary of activity and meal surrounding a low blood sugar  I will have Catie Darnelle Maffucci call  You to discuss medication changes going forward     We are working up your concern about bloating ,  Fatigue and change in BM's

## 2020-05-31 NOTE — Chronic Care Management (AMB) (Signed)
  Chronic Care Management   Note  05/31/2020 Name: [name] MRN: [id number] DOB: [birth year]  [name] is a 75 y.o. year old female who is a primary care patient of Derrel Nip, Aris Everts, MD. I reached out to Marcell Anger by phone today in response to a referral sent by [name]'s PCP, Dr. Derrel Nip      [name] was given information about Chronic Care Management services today including:  1. CCM service includes personalized support from designated clinical staff supervised by her physician, including individualized plan of care and coordination with other care providers 2. 24/7 contact phone numbers for assistance for urgent and routine care needs. 3. Service will only be billed when office clinical staff spend 20 minutes or more in a month to coordinate care. 4. Only one practitioner may furnish and bill the service in a calendar month. 5. The patient may stop CCM services at any time (effective at the end of the month) by phone call to the office staff. 6. The patient will be responsible for cost sharing (co-pay) of up to 20% of the service fee (after annual deductible is met).  Patient agreed to services and verbal consent obtained.   Follow up plan: Telephone appointment with care management team member scheduled for:06/21/2020  Noreene Larsson, [place], [place], [place] [address] Direct Dial: [phone] [email or web address] Website: [email or web address]

## 2020-05-31 NOTE — Progress Notes (Signed)
Subjective:  Patient ID: [name], female    DOB: 12-Jun-1945  Age: 75 y.o. MRN: [id number]  CC: The primary encounter diagnosis was Abdominal bloating. Diagnoses of Other constipation, Fatigue, unspecified type, Type 2 diabetes mellitus with other specified complication, without long-term current use of insulin ([place]), and Fatty liver disease, nonalcoholic were also pertinent to this visit.  HPI BAILEA BEED presents for follow up on type 2 DM  This visit occurred during the SARS-CoV-2 public health emergency.  Safety protocols were in place, including screening questions prior to the visit, additional usage of staff PPE, and extensive cleaning of exam room while observing appropriate contact time as indicated for disinfecting solutions.   Has had a change in stool caliber, taking metamucil now twice daily . Has 2 BMs per day about 3 times per week: one large stool,  Followed by a small caliber stool.  occurs 3 times per week.  Also feels bloated and has a lot of gas . Has not had an imaging study on abd since 2013.  Still has GB and ovaries.  Last colonoscopy was 2015.   T2DM:   T2DM:  She  feels generally well,  But is not  exercising regularly or trying to lose weight.   Taking   medications as directed. Following a carbohydrate modified diet 6 days per week. Denies numbness, burning and tingling of extremities. Appetite is good. ;  Log of  BS reviewed.  Labile with at least  5 below 80 at variable tmes,  Never taken in a  fasting state    Outpatient Medications Prior to Visit  Medication Sig Dispense Refill  . ACCU-CHEK AVIVA PLUS test strip USE TO CHECK BLOOD SUGAR ONCE DAILY DX: E11.65 100 strip 2  . atorvastatin (LIPITOR) 80 MG tablet TAKE 1 TABLET (80 MG TOTAL) BY MOUTH DAILY. 90 tablet 1  . blood glucose meter kit and supplies KIT Dispense based on patient and insurance preference. Use up to four times daily as directed. (FOR ICD-9 250.00, 250.01). 1 each 0  . calcium-vitamin D  (OSCAL WITH D) 250-125 MG-UNIT per tablet Take 2 tablets by mouth daily.    . famotidine (PEPCID) 20 MG tablet TAKE 1 TABLET (20 MG TOTAL) BY MOUTH DAILY. 30 MINUTES BEFORE DINNER 90 tablet 1  . folic acid (FOLVITE) 0.5 MG tablet Take 0.5 mg by mouth daily.    Marland Kitchen glipiZIDE (GLUCOTROL) 5 MG tablet TAKE 1 TABLET (5 MG TOTAL) BY MOUTH 2 (TWO) TIMES DAILY BEFORE A MEAL. 180 tablet 1  . losartan (COZAAR) 25 MG tablet TAKE 2 TABLETS BY MOUTH EVERY DAY 180 tablet 1  . methotrexate 50 MG/2ML injection INJECT 0.8 MLS SUBCUTANEOUSLY ONCE A WEEK  1  . Methotrexate, Anti-Rheumatic, (METHOTREXATE, PF, [place]) Inject 0.7 mLs into the skin once a week.     . Multiple Vitamin (MULTIVITAMIN) tablet Take 1 tablet by mouth daily.    . pantoprazole (PROTONIX) 40 MG tablet TAKE 1 TABLET BY MOUTH EVERY DAY 90 tablet 1  . Psyllium (METAMUCIL FIBER PO) Take by mouth.    . vitamin C (ASCORBIC ACID) 500 MG tablet Take 500 mg by mouth daily.    . Insulin Syringe-Needle U-100 (INSULIN SYRINGE 1CC/31GX5/16") 31G X 5/16" 1 ML MISC Use to inject insulin twice daily. 100 each 1  . metFORMIN (GLUCOPHAGE) 500 MG tablet Take 1 tablet (500 mg total) by mouth 2 (two) times daily with a meal. 180 tablet 3   No facility-administered medications prior  to visit.    Review of Systems;  Patient denies headache, fevers, malaise, unintentional weight loss, skin rash, eye pain, sinus congestion and sinus pain, sore throat, dysphagia,  hemoptysis , cough, dyspnea, wheezing, chest pain, palpitations, orthopnea, edema, abdominal pain, nausea, melena, diarrhea, constipation, flank pain, dysuria, hematuria, urinary  Frequency, nocturia, numbness, tingling, seizures,  Focal weakness, Loss of consciousness,  Tremor, insomnia, depression, anxiety, and suicidal ideation.      Objective:  BP 126/74 (BP Location: Left Arm, Patient Position: Sitting)   Pulse 83   Temp (!) 97.4 F (36.3 C)   Ht 5' 5.98" (1.676 m)   Wt 181 lb 6.4 oz (82.3 kg)   SpO2  99%   BMI 29.29 kg/m   BP Readings from Last 3 Encounters:  05/31/20 126/74  03/01/20 130/72  08/23/19 128/68    Wt Readings from Last 3 Encounters:  05/31/20 181 lb 6.4 oz (82.3 kg)  05/01/20 176 lb (79.8 kg)  03/01/20 176 lb 6.4 oz (80 kg)    General appearance: alert, cooperative and appears stated age Ears: normal TM's and external ear canals both ears Throat: lips, mucosa, and tongue normal; teeth and gums normal Neck: no adenopathy, no carotid bruit, supple, symmetrical, trachea midline and thyroid not enlarged, symmetric, no tenderness/mass/nodules Back: symmetric, no curvature. ROM normal. No CVA tenderness. Lungs: clear to auscultation bilaterally Heart: regular rate and rhythm, S1, S2 normal, no murmur, click, rub or gallop Abdomen: soft, non-tender; bowel sounds normal; no masses,  no organomegaly Pulses: 2+ and symmetric Skin: Skin color, texture, turgor normal. No rashes or lesions Lymph nodes: Cervical, supraclavicular, and axillary nodes normal.  Lab Results  Component Value Date   HGBA1C 7.5 (H) 05/23/2020   HGBA1C 8.1 (H) 02/22/2020   HGBA1C 7.3 (H) 08/21/2019    Lab Results  Component Value Date   CREATININE 0.80 05/23/2020   CREATININE 0.73 02/22/2020   CREATININE 0.75 08/21/2019    Lab Results  Component Value Date   WBC 4.5 05/31/2020   HGB 14.2 05/31/2020   HCT 42.2 05/31/2020   PLT 230.0 05/31/2020   GLUCOSE 182 (H) 05/23/2020   CHOL 119 02/22/2020   TRIG 94.0 02/22/2020   HDL 43.00 02/22/2020   LDLDIRECT 81 07/29/2017   LDLCALC 57 02/22/2020   ALT 24 05/23/2020   AST 32 05/23/2020   NA 137 05/23/2020   K 4.3 05/23/2020   CL 101 05/23/2020   CREATININE 0.80 05/23/2020   BUN 12 05/23/2020   CO2 29 05/23/2020   TSH 2.34 12/24/2016   HGBA1C 7.5 (H) 05/23/2020   MICROALBUR 1.2 02/22/2020    No results found.  Assessment & Plan:   Problem List Items Addressed This Visit      Unprioritized   Constipation    Current symptoms  of change in bowel pattern and increased bloating to be investigated for worsening constipation and ovarian CA .  Plain film shows a lare stool burden and lytes are normal.  Will recommend a bowel cleanout.       Relevant Medications   polyethylene glycol (GOLYTELY) 236 g solution   Fatigue     Screening labs normal.  No history of snoring.  Recommended participating in regular exercise program with goal of achieving a minimum of 30 minutes of aerobic activity 5 days per week.     Lab Results  Component Value Date   WBC 4.5 05/31/2020   HGB 14.2 05/31/2020   HCT 42.2 05/31/2020   MCV 93.0 05/31/2020  PLT 230.0 05/31/2020   Lab Results  Component Value Date   HBZJIRCV89 381 05/31/2020   Lab Results  Component Value Date   IRON 116 05/31/2020   TIBC 382 05/31/2020   FERRITIN 14 (L) 05/31/2020         Relevant Orders   CBC with Differential/Platelet (Completed)   Iron, TIBC and Ferritin Panel (Completed)   Vitamin B12 (Completed)   Fatty liver disease, nonalcoholic    Secondary to obesity and diabetes/metabolic syndrome. I have encouraged lowering of   BMI and adherence  To a low glycemic index diet utilizing smaller more frequent meals to increase metabolism.  She is walking 3 miles daily    Lab Results  Component Value Date   ALT 24 05/23/2020   AST 32 05/23/2020   ALKPHOS 100 05/23/2020   BILITOT 1.1 05/23/2020   Repeat  revaccination for Hep a and B complete       Type 2 diabetes mellitus with other specified complication (HCC)    Fontrol is improved but not at goal .  I have  advised her to increase dose of metformin to 850 mg .  Continue dinnertime dose of glipizide at 2.5 mg and asked to provide more detail surrounding the low readings .  Referral to Eula Fried for assistance in       Relevant Medications   metFORMIN (GLUCOPHAGE) 850 MG tablet   Other Relevant Orders   AMB Referral to [place]    Other Visit Diagnoses    Abdominal  bloating    -  Primary   Relevant Orders   DG Abd 1 View (Completed)   CA 125      I provided  30 minutes of  face-to-face time during this encounter reviewing patient's current problems and past surgeries, labs and imaging studies, providing counseling on the above mentioned problems , and coordination  of care .  I have discontinued [place] 1CC/31GX5/16". I have also changed her metFORMIN. Additionally, I am having her start on polyethylene glycol. Lastly, I am having her maintain her vitamin C, multivitamin, calcium-vitamin D, blood glucose meter kit and supplies, (Methotrexate, Anti-Rheumatic, (METHOTREXATE, PF, [place])), Psyllium (METAMUCIL FIBER PO), folic acid, methotrexate, famotidine, atorvastatin, losartan, pantoprazole, Accu-Chek Aviva Plus, and glipiZIDE.  Meds ordered this encounter  Medications  . metFORMIN (GLUCOPHAGE) 850 MG tablet    Sig: Take 1 tablet (850 mg total) by mouth 2 (two) times daily with a meal. Note dose change.  KEEP ON FILE FOR FUTURE REFILLS    Dispense:  180 tablet    Refill:  0  . polyethylene glycol (GOLYTELY) 236 g solution    Sig: Drink 8 ounces e ry 30 minutes until stool runs clear    Dispense:  4000 mL    Refill:  0    Medications Discontinued During This Encounter  Medication Reason  . metFORMIN (GLUCOPHAGE) 500 MG tablet   . Insulin Syringe-Needle U-100 (INSULIN SYRINGE 1CC/31GX5/16") 31G X 5/16" 1 ML MISC     Follow-up: Return in about 3 months (around 08/29/2020) for follow up diabetes.   Crecencio Mc, MD

## 2020-06-02 MED ORDER — PEG 3350-KCL-NABCB-NACL-NASULF 236 G PO SOLR: ORAL | 0 refills | Status: DC

## 2020-06-02 NOTE — Assessment & Plan Note (Signed)
Secondary to obesity and diabetes/metabolic syndrome. I have encouraged lowering of   BMI and adherence  To a low glycemic index diet utilizing smaller more frequent meals to increase metabolism.  She is walking 3 miles daily    Lab Results  Component Value Date   ALT 24 05/23/2020   AST 32 05/23/2020   ALKPHOS 100 05/23/2020   BILITOT 1.1 05/23/2020   Repeat  revaccination for Hep a and B complete

## 2020-06-02 NOTE — Assessment & Plan Note (Signed)
Fontrol is improved but not at goal .  I have  advised her to increase dose of metformin to 850 mg .  Continue dinnertime dose of glipizide at 2.5 mg and asked to provide more detail surrounding the low readings .  Referral to Eula Fried for assistance in

## 2020-06-02 NOTE — Assessment & Plan Note (Addendum)
  Screening labs normal.  No history of snoring.  Recommended participating in regular exercise program with goal of achieving a minimum of 30 minutes of aerobic activity 5 days per week.     Lab Results  Component Value Date   WBC 4.5 05/31/2020   HGB 14.2 05/31/2020   HCT 42.2 05/31/2020   MCV 93.0 05/31/2020   PLT 230.0 05/31/2020   Lab Results  Component Value Date   OHYWVPXT06 269 05/31/2020   Lab Results  Component Value Date   IRON 116 05/31/2020   TIBC 382 05/31/2020   FERRITIN 14 (L) 05/31/2020

## 2020-06-02 NOTE — Assessment & Plan Note (Addendum)
Current symptoms of change in bowel pattern and increased bloating to be investigated for worsening constipation and ovarian CA .  Plain film shows a lare stool burden and lytes are normal.  Will recommend a bowel cleanout.

## 2020-06-03 LAB — IRON,TIBC AND FERRITIN PANEL
%SAT: 30 % (calc) (ref 16–45)
Ferritin: 14 ng/mL — ABNORMAL LOW (ref 16–288)
Iron: 116 ug/dL (ref 45–160)
TIBC: 382 mcg/dL (calc) (ref 250–450)

## 2020-06-03 LAB — CA 125: CA 125: 11 U/mL (ref <35)

## 2020-06-07 DIAGNOSIS — R140 Abdominal distension (gaseous): Secondary | ICD-10-CM

## 2020-06-21 ENCOUNTER — Ambulatory Visit (INDEPENDENT_AMBULATORY_CARE_PROVIDER_SITE_OTHER): Payer: Medicare PPO | Admitting: Pharmacist

## 2020-06-21 DIAGNOSIS — E782 Mixed hyperlipidemia: Secondary | ICD-10-CM

## 2020-06-21 DIAGNOSIS — M05719 Rheumatoid arthritis with rheumatoid factor of unspecified shoulder without organ or systems involvement: Secondary | ICD-10-CM

## 2020-06-21 DIAGNOSIS — E1169 Type 2 diabetes mellitus with other specified complication: Secondary | ICD-10-CM

## 2020-06-21 DIAGNOSIS — I10 Essential (primary) hypertension: Secondary | ICD-10-CM | POA: Diagnosis not present

## 2020-06-21 DIAGNOSIS — K760 Fatty (change of) liver, not elsewhere classified: Secondary | ICD-10-CM

## 2020-06-21 MED ORDER — LOSARTAN POTASSIUM 50 MG PO TABS: 50.0000 mg | ORAL_TABLET | Freq: Every day | ORAL | 3 refills | Status: DC

## 2020-06-21 MED ORDER — ATORVASTATIN CALCIUM 40 MG PO TABS: 40.0000 mg | ORAL_TABLET | Freq: Every day | ORAL | 3 refills | Status: DC

## 2020-06-21 MED ORDER — EMPAGLIFLOZIN 10 MG PO TABS: 10.0000 mg | ORAL_TABLET | Freq: Every day | ORAL | 0 refills | Status: DC

## 2020-06-21 MED ORDER — GLIPIZIDE 5 MG PO TABS: 2.5000 mg | ORAL_TABLET | Freq: Two times a day (BID) | ORAL | 1 refills | Status: DC
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
## 2020-06-21 NOTE — Patient Instructions (Signed)
[name],   It was great talking with you today!  Increase metformin to 500 mg in the morning, 1000 mg in the evening.   Start Jardiance 10 mg daily. Take this in the morning. Focus on staying well hydrated. Call us if you develop any burning/itching while urinating or other symptoms that you believe to be related to a urinary tract infection or yeast infection.   Decrease glipizide to 2.5 mg with breakfast, 2.5 mg with supper.   Take your blood sugar twice daily - 1) fasting, before eating breakfast and 2) about 2 hours after meals. Our goal A1c <7% corresponds with fasting sugars <130 and 2 hour after meal sugars <180.   If you two make less than $45,775 per year, we could apply to get Jardiance for free from the manufacturer. There is also a cousin medication, Wilder Glade, that works the same way but is made by a [employer] representative with a higher income cut off - <$54,930. We could switch and apply for assistance for Wilder Glade if needed in the future.   Call me with any questions or concerns!  Catie Darnelle Maffucci, PharmD [phone]   Visit Information  PATIENT GOALS:  Goals Addressed              This Visit's Progress     Patient Stated   .  Medication Monitoring (pt-stated)         Patient Goals/Self-Care Activities . Over the next 90 days, patient will:  - take medications as prescribed check glucose BID, document, and provide at future appointments       Consent to CCM Services: [name] was given information about Chronic Care Management services today including:  1. CCM service includes personalized support from designated clinical staff supervised by her physician, including individualized plan of care and coordination with other care providers 2. 24/7 contact phone numbers for assistance for urgent and routine care needs. 3. Service will only be billed when office clinical staff spend 20 minutes or more in a month to coordinate care. 4. Only one practitioner may  furnish and bill the service in a calendar month. 5. The patient may stop CCM services at any time (effective at the end of the month) by phone call to the office staff. 6. The patient will be responsible for cost sharing (co-pay) of up to 20% of the service fee (after annual deductible is met).  Patient agreed to services and verbal consent obtained.   Print copy of patient instructions, educational materials, and care plan provided in person.   Plan: Telephone follow up appointment with care management team member scheduled for:  ~ 4 weeks  Catie Darnelle Maffucci, PharmD, [place], CPP Clinical Pharmacist [place] at [facility] [place]: Patient Care Plan: Medication Management    Problem Identified: Diabetes, Hypertension, Hyperlipidemia     Long-Range Goal: Disease Progression Prevention   Start Date: 06/21/2020  This Visit's Progress: On track  Priority: High  Note:   Current Barriers:  . Unable to achieve control of diabetes  . Suboptimal therapeutic regimen for diabetes   Pharmacist Clinical Goal(s):  Marland Kitchen Over the next 90 days, patient will achieve control of diabetes as evidenced by A1c  through collaboration with PharmD and provider.   Interventions: . 1:1 collaboration with Crecencio Mc, MD regarding development and update of comprehensive plan of care as evidenced by provider attestation and co-signature . Inter-disciplinary care team collaboration (see longitudinal plan of care) . Comprehensive medication review  performed; medication list updated in electronic medical record  Diabetes: . Uncontrolled; current treatment regimen: metformin 500 mg QAM, 750 mg QPM (using script for 500 mg tablets first) . Current glucose readings: fasting glucose: 140-160s, post prandial glucose: 90-140 . Reports occasional episodes of hypoglycemia . Reports daily exercise, walks for 30-60 minutes daily . Extensive discussion about risk of  hypoglycemia, weight gain with glipizide. Discussed goal of minimization and elimination of hypoglycemia-prone medications . Discussed benefit of maximizing metformin dose. Increase metformin to 500 mg QAM, 1000 mg QPM. Will continue using 500 mg tablet strength and eventually maximize to 1000 mg BID as tolerated.  . Discussed SGLT2 vs GLP1. Discussed mechanism of action, benefits, side effects. Would avoid GLP1 at this time due to concurrent unexplained constipation. Patient amenable to SGLT2, including cost. Start Jardiance 10 mg daily. Will provide patient with samples to start.  . Decrease glipizide to 2.5 mg BID to reduce risk of post prandial hypoglycemia with metformin increase and Jardiance initiation. Patient counseled to discontinue glipizide if hypoglycemia.  . Educated on goal A1c, goal fasting, and goal 2 hour post prandial glucose. Patient will start checking glucose BID.   Hypertension: . Controlled per last clinic visit; current treatment: losartan 50 mg daily (taking 2 25 mg tablets)  . To reduce pill burden, changing to 50 mg tablet. Script sent to pharmacy.   Hyperlipidemia: . Controlled per last lab work; current treatment: atorvastatin 80 mg, but patient reports she has been taking half of a tablet "for a long time" to save on costs. Reports that atorvastatin copay is cheap. Notes that she is [facility] earlier than she needs . Reviewed insurance principals regarding statin adherence. Will change script to atorvastatin 40 mg daily tablet. Patient amenable.  Rheumatoid Arthritis: . Controlled per patient report; current regimen: methotrexate 10 mg (0.4 mL) weekly, folic acid daily . Reports that she is working with Dr. Jefm Bryant to taper down, but they worry about any further taper due to previous severity of disease . Continue current regimen along with rheumatology collaboration  GERD: . Controlled per patient report; current regimen:  pantoprazole 40 mg daily, famotidine 20 mg daily . Continue current regimen at this time  Supplements: . Calcium + Vitamin D, Vitamin C. Patient wonders how much calcium she should be taking. Discussed recommendation for Vitamin D 1000 units daily + calcium 1200 mg daily for a patient with osteopenia. Discussed that she usually has at least 1 dairy serving daily. Discussed that 1 tablet (patient unsure of strength, but most formulations 400-600 mg) of calcium daily would be sufficient.    Patient Goals/Self-Care Activities . Over the next 90 days, patient will:  - take medications as prescribed check glucose BID, document, and provide at future appointments  Follow Up Plan: Telephone follow up appointment with care management team member scheduled for: ~4 weeks

## 2020-06-23 ENCOUNTER — Other Ambulatory Visit: Payer: Self-pay | Admitting: Internal Medicine

## 2020-06-24 ENCOUNTER — Other Ambulatory Visit: Payer: Self-pay

## 2020-06-24 ENCOUNTER — Ambulatory Visit
Admission: RE | Admit: 2020-06-24 | Discharge: 2020-06-24 | Disposition: A | Discharge status: Home / Self Care | Payer: Medicare PPO | Source: Ambulatory Visit | Attending: Internal Medicine | Admitting: Internal Medicine

## 2020-06-24 DIAGNOSIS — K5730 Diverticulosis of large intestine without perforation or abscess without bleeding: Secondary | ICD-10-CM | POA: Insufficient documentation

## 2020-06-24 DIAGNOSIS — R140 Abdominal distension (gaseous): Secondary | ICD-10-CM | POA: Diagnosis not present

## 2020-06-24 MED ORDER — IOHEXOL 300 MG/ML  SOLN
85.0000 mL | Freq: Once | INTRAMUSCULAR | Status: AC | PRN
  Administered 2020-06-24: 85 mL via INTRAVENOUS

## 2020-06-25 NOTE — Progress Notes (Signed)
Your abdomen and pelvis CT was normal.  No suspicious areas or masses.

## 2020-07-01 ENCOUNTER — Other Ambulatory Visit: Payer: Self-pay

## 2020-07-01 ENCOUNTER — Other Ambulatory Visit (HOSPITAL_COMMUNITY)
Admission: RE | Admit: 2020-07-01 | Discharge: 2020-07-01 | Disposition: A | Discharge status: Home / Self Care | Payer: Medicare PPO | Source: Ambulatory Visit | Attending: Obstetrics & Gynecology | Admitting: Obstetrics & Gynecology

## 2020-07-01 ENCOUNTER — Encounter: Payer: Self-pay | Admitting: Obstetrics & Gynecology

## 2020-07-01 ENCOUNTER — Ambulatory Visit (INDEPENDENT_AMBULATORY_CARE_PROVIDER_SITE_OTHER): Payer: Medicare PPO | Admitting: Obstetrics & Gynecology

## 2020-07-01 VITALS — BP 133/76 | HR 78 | Wt 178.4 lb

## 2020-07-01 DIAGNOSIS — R87810 Cervical high risk human papillomavirus (HPV) DNA test positive: Secondary | ICD-10-CM | POA: Insufficient documentation

## 2020-07-01 DIAGNOSIS — Z01419 Encounter for gynecological examination (general) (routine) without abnormal findings: Secondary | ICD-10-CM | POA: Diagnosis not present

## 2020-07-01 DIAGNOSIS — R140 Abdominal distension (gaseous): Secondary | ICD-10-CM

## 2020-07-01 DIAGNOSIS — R87610 Atypical squamous cells of undetermined significance on cytologic smear of cervix (ASC-US): Secondary | ICD-10-CM | POA: Insufficient documentation

## 2020-07-01 DIAGNOSIS — Z1151 Encounter for screening for human papillomavirus (HPV): Secondary | ICD-10-CM | POA: Insufficient documentation

## 2020-07-01 DIAGNOSIS — Z1231 Encounter for screening mammogram for malignant neoplasm of breast: Secondary | ICD-10-CM | POA: Diagnosis not present

## 2020-07-01 NOTE — Patient Instructions (Signed)
Let us know if you want pelvic ultrasound or other evaluation for bloating to evaluate ovaries.   Preventive Care 74 Years and Older, Female Preventive care refers to lifestyle choices and visits with your health care provider that can promote health and wellness. This includes:  A yearly physical exam. This is also called an annual wellness visit.  Regular dental and eye exams.  Immunizations.  Screening for certain conditions.  Healthy lifestyle choices, such as: ? Eating a healthy diet. ? Getting regular exercise. ? Not using drugs or products that contain nicotine and tobacco. ? Limiting alcohol use. What can I expect for my preventive care visit? Physical exam Your health care provider will check your:  Height and weight. These may be used to calculate your BMI (body mass index). BMI is a measurement that tells if you are at a healthy weight.  Heart rate and blood pressure.  Body temperature.  Skin for abnormal spots. Counseling Your health care provider may ask you questions about your:  Past medical problems.  Family's medical history.  Alcohol, tobacco, and drug use.  Emotional well-being.  Home life and relationship well-being.  Sexual activity.  Diet, exercise, and sleep habits.  History of falls.  Memory and ability to understand (cognition).  Work and work Statistician.  Pregnancy and menstrual history.  Access to firearms. What immunizations do I need? Vaccines are usually given at various ages, according to a schedule. Your health care provider will recommend vaccines for you based on your age, medical history, and lifestyle or other factors, such as travel or where you work.   What tests do I need? Blood tests  Lipid and cholesterol levels. These may be checked every 5 years, or more often depending on your overall health.  Hepatitis C test.  Hepatitis B test. Screening  Lung cancer screening. You may have this screening every year  starting at age 54 if you have a 30-pack-year history of smoking and currently smoke or have quit within the past 15 years.  Colorectal cancer screening. ? All adults should have this screening starting at age 41 and continuing until age 92. ? Your health care provider may recommend screening at age 64 if you are at increased risk. ? You will have tests every 1-10 years, depending on your results and the type of screening test.  Diabetes screening. ? This is done by checking your blood sugar (glucose) after you have not eaten for a while (fasting). ? You may have this done every 1-3 years.  Mammogram. ? This may be done every 1-2 years. ? Talk with your health care provider about how often you should have regular mammograms.  Abdominal aortic aneurysm (AAA) screening. You may need this if you are a current or former smoker.  BRCA-related cancer screening. This may be done if you have a family history of breast, ovarian, tubal, or peritoneal cancers. Other tests  STD (sexually transmitted disease) testing, if you are at risk.  Bone density scan. This is done to screen for osteoporosis. You may have this done starting at age 24. Talk with your health care provider about your test results, treatment options, and if necessary, the need for more tests. Follow these instructions at home: Eating and drinking  Eat a diet that includes fresh fruits and vegetables, whole grains, lean protein, and low-fat dairy products. Limit your intake of foods with high amounts of sugar, saturated fats, and salt.  Take vitamin and mineral supplements as recommended by your health  care provider.  Do not drink alcohol if your health care provider tells you not to drink.  If you drink alcohol: ? Limit how much you have to 0-1 drink a day. ? Be aware of how much alcohol is in your drink. In the [place] one drink equals one 12 oz bottle of beer (355 mL), one 5 oz glass of wine (148 mL), or one 1 oz glass of  hard liquor (44 mL).   Lifestyle  Take daily care of your teeth and gums. Brush your teeth every morning and night with fluoride toothpaste. Floss one time each day.  Stay active. Exercise for at least 30 minutes 5 or more days each week.  Do not use any products that contain nicotine or tobacco, such as cigarettes, e-cigarettes, and chewing tobacco. If you need help quitting, ask your health care provider.  Do not use drugs.  If you are sexually active, practice safe sex. Use a condom or other form of protection in order to prevent STIs (sexually transmitted infections).  Talk with your health care provider about taking a low-dose aspirin or statin.  Find healthy ways to cope with stress, such as: ? Meditation, yoga, or listening to music. ? Journaling. ? Talking to a trusted person. ? Spending time with friends and family. Safety  Always wear your seat belt while driving or riding in a vehicle.  Do not drive: ? If you have been drinking alcohol. Do not ride with someone who has been drinking. ? When you are tired or distracted. ? While texting.  Wear a helmet and other protective equipment during sports activities.  If you have firearms in your house, make sure you follow all gun safety procedures. What's next?  Visit your health care provider once a year for an annual wellness visit.  Ask your health care provider how often you should have your eyes and teeth checked.  Stay up to date on all vaccines. This information is not intended to replace advice given to you by your health care provider. Make sure you discuss any questions you have with your health care provider. Document Revised: 04/24/2020 Document Reviewed: 04/28/2018 Elsevier Patient Education  2021 [employer].

## 2020-07-01 NOTE — Progress Notes (Signed)
GYNECOLOGY ANNUAL PREVENTATIVE CARE ENCOUNTER NOTE  History:     [name] is a 75 y.o. [phone] female here for a routine annual gynecologic exam.  Current complaints: abdominal bloating.  PCP feels it is gas, she endorses a lot of flatus lately. Had CT scan recently that showed no abnormalities.   Denies abnormal vaginal bleeding, discharge, pelvic pain, problems with intercourse or other gynecologic concerns.    Gynecologic History No LMP recorded. Patient is postmenopausal. Contraception: none Last Pap: 06/22/2019. Results were: normal with positive HPV (negative 16, 18/45) Normal cytology on pap smears, but positive high risk HPV in 2014-2021. 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 Last mammogram: 08/04/2019. Results were: normal  Obstetric History OB History  Gravida Para Term Preterm AB Living  2 2 2  0 0 2  SAB IAB Ectopic Multiple Live Births  0 0 0 0 2    # Outcome Date GA Lbr Len/2nd Weight Sex Delivery Anes PTL Lv  2 Term     M Vag-Spont   LIV  1 Term     M Vag-Spont   LIV    Past Medical History:  Diagnosis Date  . Diabetes mellitus   . Gastritis   . High cholesterol   . Hyperlipidemia    Lipitor trial was a failure  . Hypertension     Past Surgical History:  Procedure Laterality Date  . CARDIAC CATHETERIZATION  Augus 2007   normal coronaries,  EF 60%  . [place] [place]  . SPINE SURGERY     bone graft   . TUBAL LIGATION      Current Outpatient Medications on File Prior to Visit  Medication Sig Dispense Refill  . atorvastatin (LIPITOR) 40 MG tablet Take 1 tablet (40 mg total) by mouth daily. 90 tablet 3  . calcium-vitamin D (OSCAL WITH D) 250-125 MG-UNIT per tablet Take 2 tablets by mouth daily.    . empagliflozin (JARDIANCE) 10 MG TABS tablet Take 1 tablet (10 mg total) by mouth daily before breakfast. 30 tablet 0  . famotidine (PEPCID)  20 MG tablet TAKE 1 TABLET (20 MG TOTAL) BY MOUTH DAILY. 30 MINUTES BEFORE DINNER 90 tablet 1  . folic acid (FOLVITE) 0.5 MG tablet Take 0.5 mg by mouth daily.    Marland Kitchen glipiZIDE (GLUCOTROL) 5 MG tablet Take 0.5 tablets (2.5 mg total) by mouth 2 (two) times daily before a meal. 90 tablet 1  . losartan (COZAAR) 50 MG tablet Take 1 tablet (50 mg total) by mouth daily. 90 tablet 3  . metFORMIN (GLUCOPHAGE) 850 MG tablet Take 1 tablet (850 mg total) by mouth 2 (two) times daily with a meal. Note dose change.  KEEP ON FILE FOR FUTURE REFILLS 180 tablet 0  . methotrexate 50 MG/2ML injection 0.4 mL weekly  1  . Multiple Vitamin (MULTIVITAMIN) tablet Take 1 tablet by mouth daily.    . pantoprazole (PROTONIX) 40 MG tablet TAKE 1 TABLET BY MOUTH EVERY DAY 90 tablet 1  . polyethylene glycol (GOLYTELY) 236 g solution Drink 8 ounces e ry 30 minutes until stool runs clear 4000 mL 0  . Psyllium (METAMUCIL FIBER PO) Take by mouth.    . vitamin C (ASCORBIC ACID) 500 MG tablet Take 500 mg by mouth daily.    Marland Kitchen ACCU-CHEK AVIVA PLUS test strip USE TO CHECK BLOOD SUGAR ONCE DAILY DX: E11.65 100 strip 2  . blood  glucose meter kit and supplies KIT Dispense based on patient and insurance preference. Use up to four times daily as directed. (FOR ICD-9 250.00, 250.01). 1 each 0  . [DISCONTINUED] ferrous sulfate 325 (65 FE) MG tablet Take 325 mg by mouth daily with breakfast.    . [DISCONTINUED] omeprazole (PRILOSEC) 40 MG capsule Take 1 capsule (40 mg total) by mouth daily. 30 capsule 1   No current facility-administered medications on file prior to visit.    Allergies  Allergen Reactions  . Amoxicillin Hives  . Latex Rash    Social History:  reports that she has never smoked. She has never used smokeless tobacco. She reports that she does not drink alcohol and does not use drugs.  Family History  Problem Relation Age of Onset  . Diabetes Mother   . Heart disease Father   . Stroke Father   . Diabetes Father   .  Diabetes Maternal Grandfather   . Diabetes Brother   . Diabetes Brother   . Breast cancer Neg Hx     The following portions of the patient's history were reviewed and updated as appropriate: allergies, current medications, past family history, past medical history, past social history, past surgical history and problem list.  Review of Systems Pertinent items noted in HPI and remainder of comprehensive ROS otherwise negative.  Physical Exam:  BP 133/76   Pulse 78   Wt 178 lb 6.4 oz (80.9 kg)   BMI 28.81 kg/m  CONSTITUTIONAL: Well-developed, well-nourished female in no acute distress.  HENT:  Normocephalic, atraumatic, External right and left ear normal.  EYES: Conjunctivae and EOM are normal. Pupils are equal, round, and reactive to light. No scleral icterus.  NECK: Normal range of motion, supple, no masses.  Normal thyroid.  SKIN: Skin is warm and dry. No rash noted. Not diaphoretic. No erythema. No pallor. MUSCULOSKELETAL: Normal range of motion. No tenderness.  No cyanosis, clubbing, or edema. NEUROLOGIC: Alert and oriented to person, place, and time. Normal reflexes, muscle tone coordination.  PSYCHIATRIC: Normal mood and affect. Normal behavior. Normal judgment and thought content. CARDIOVASCULAR: Normal heart rate noted, regular rhythm RESPIRATORY: Clear to auscultation bilaterally. Effort and breath sounds normal, no problems with respiration noted. BREASTS: Symmetric in size. No masses, tenderness, skin changes, nipple drainage, or lymphadenopathy bilaterally. Performed in the presence of a chaperone. ABDOMEN: Soft, some distention noted.  No tenderness, rebound or guarding.  PELVIC: Normal appearing external genitalia and urethral meatus; normal appearing vaginal mucosa and cervixmoderate atrophy and cervix..  No abnormal discharge noted.  Pap smear obtained.  Normal uterine size, no other palpable masses, no uterine or adnexal tenderness.  Performed in the presence of a  chaperone.   Assessment and Plan:    1. Abdominal bloating Negative CT scan of abdomen and pelvis. Offered pelvic ultrasound, she declined for now. Continue to monitor. Likely GI related.  2. Breast cancer screening by mammogram Mammogram scheduled - MM 3D SCREEN BREAST BILATERAL; Future  3. Normal cytology on pap smears, but positive high risk HPV in 2014-2019 4. Well woman exam with routine gynecological exam - Cytology - PAP Will follow up results of pap smear and manage accordingly. Routine preventative health maintenance measures emphasized. Please refer to After Visit Summary for other counseling recommendations.      Verita Schneiders, MD, [place] for [employer], [place]

## 2020-07-04 DIAGNOSIS — Z79899 Other long term (current) drug therapy: Secondary | ICD-10-CM | POA: Diagnosis not present

## 2020-07-04 DIAGNOSIS — M0609 Rheumatoid arthritis without rheumatoid factor, multiple sites: Secondary | ICD-10-CM | POA: Diagnosis not present

## 2020-07-08 ENCOUNTER — Encounter: Payer: Self-pay | Admitting: Obstetrics & Gynecology

## 2020-07-08 DIAGNOSIS — R87610 Atypical squamous cells of undetermined significance on cytologic smear of cervix (ASC-US): Secondary | ICD-10-CM | POA: Insufficient documentation

## 2020-07-08 DIAGNOSIS — R87810 Cervical high risk human papillomavirus (HPV) DNA test positive: Secondary | ICD-10-CM | POA: Insufficient documentation

## 2020-07-08 LAB — CYTOLOGY - PAP
Comment: NEGATIVE
Comment: NEGATIVE
Diagnosis: UNDETERMINED — AB
HPV 16: NEGATIVE
HPV 18 / 45: NEGATIVE
High risk HPV: POSITIVE — AB

## 2020-07-09 ENCOUNTER — Telehealth: Payer: Self-pay | Admitting: *Deleted

## 2020-07-09 ENCOUNTER — Telehealth: Payer: Self-pay

## 2020-07-09 DIAGNOSIS — M0609 Rheumatoid arthritis without rheumatoid factor, multiple sites: Secondary | ICD-10-CM | POA: Diagnosis not present

## 2020-07-09 DIAGNOSIS — Z79899 Other long term (current) drug therapy: Secondary | ICD-10-CM | POA: Diagnosis not present

## 2020-07-09 DIAGNOSIS — E119 Type 2 diabetes mellitus without complications: Secondary | ICD-10-CM | POA: Diagnosis not present

## 2020-07-09 DIAGNOSIS — M353 Polymyalgia rheumatica: Secondary | ICD-10-CM | POA: Diagnosis not present

## 2020-07-09 NOTE — Telephone Encounter (Signed)
-----   Message from Sydney Oman, MD sent at 07/08/2020  6:21 PM EST ----- Please schedule patient for colposcopy for ASCUS +HPV pap done on 07/01/2020. Please call to inform patient of results and need for appointment.

## 2020-07-09 NOTE — Telephone Encounter (Signed)
Called pt to verify she saw her results and message from Dr Harolyn Rutherford and the need for a colpo. Pt was aware and no further questions. Will schedule her colpo.

## 2020-07-09 NOTE — Telephone Encounter (Signed)
-----   Message from Osborne Oman, MD sent at 07/08/2020  6:21 PM EST ----- Please schedule patient for colposcopy for ASCUS +HPV pap done on 07/01/2020. Please call to inform patient of results and need for appointment.

## 2020-07-18 ENCOUNTER — Ambulatory Visit (INDEPENDENT_AMBULATORY_CARE_PROVIDER_SITE_OTHER): Payer: Medicare PPO | Admitting: Pharmacist

## 2020-07-18 DIAGNOSIS — I10 Essential (primary) hypertension: Secondary | ICD-10-CM

## 2020-07-18 DIAGNOSIS — E782 Mixed hyperlipidemia: Secondary | ICD-10-CM | POA: Diagnosis not present

## 2020-07-18 DIAGNOSIS — E1169 Type 2 diabetes mellitus with other specified complication: Secondary | ICD-10-CM

## 2020-07-18 DIAGNOSIS — M05719 Rheumatoid arthritis with rheumatoid factor of unspecified shoulder without organ or systems involvement: Secondary | ICD-10-CM

## 2020-07-18 MED ORDER — EMPAGLIFLOZIN 10 MG PO TABS: 10.0000 mg | ORAL_TABLET | Freq: Every day | ORAL | 3 refills | Status: DC

## 2020-07-18 MED ORDER — METFORMIN HCL 500 MG PO TABS: ORAL_TABLET | ORAL | 1 refills | Status: DC

## 2020-07-18 NOTE — Patient Instructions (Addendum)
Ms. Kosel,   It was great to talk to you today!  STOP glipizide.   Continue metformin 500 mg daily, 1000 mg every evening. Continue Jardiance 10 mg daily.   Keep checking your glucose 1) fasting and 2) 2 hours after meals. We would like fasting readings less than 130 and 2 hour after meal readings less than 180. If your readings trend back up above this, please call me. We can discuss increasing metformin or increasing Jardiance.   Keep up the great work!  Catie Darnelle Maffucci, PharmD [phone]  Visit Information  PATIENT GOALS: Goals Addressed              This Visit's Progress     Patient Stated   .  Medication Monitoring (pt-stated)         Patient Goals/Self-Care Activities . Over the next 90 days, patient will:  - take medications as prescribed check glucose BID, document, and provide at future appointments        The patient verbalized understanding of instructions, educational materials, and care plan provided today and agreed to receive a mailed copy of patient instructions, educational materials, and care plan.   Plan: Telephone follow up appointment with care management team member scheduled for:  ~ 8 weeks  Catie Darnelle Maffucci, PharmD, [place], [place] Clinical Pharmacist [employer] [facility] [employer] [phone]
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
## 2020-08-06 DIAGNOSIS — E119 Type 2 diabetes mellitus without complications: Secondary | ICD-10-CM | POA: Diagnosis not present

## 2020-08-06 LAB — HM DIABETES EYE EXAM

## 2020-08-08 DIAGNOSIS — Z1231 Encounter for screening mammogram for malignant neoplasm of breast: Secondary | ICD-10-CM | POA: Diagnosis not present

## 2020-08-08 LAB — HM MAMMOGRAPHY

## 2020-08-09 ENCOUNTER — Encounter: Payer: Self-pay | Admitting: Radiology

## 2020-08-12 ENCOUNTER — Other Ambulatory Visit: Payer: Self-pay

## 2020-08-12 ENCOUNTER — Encounter: Payer: Self-pay | Admitting: Obstetrics & Gynecology

## 2020-08-12 ENCOUNTER — Ambulatory Visit: Payer: Medicare PPO | Admitting: Pharmacist

## 2020-08-12 ENCOUNTER — Other Ambulatory Visit: Payer: Self-pay | Admitting: Obstetrics & Gynecology

## 2020-08-12 ENCOUNTER — Ambulatory Visit (INDEPENDENT_AMBULATORY_CARE_PROVIDER_SITE_OTHER): Payer: Medicare PPO | Admitting: Obstetrics & Gynecology

## 2020-08-12 VITALS — BP 128/81 | HR 67

## 2020-08-12 DIAGNOSIS — R87810 Cervical high risk human papillomavirus (HPV) DNA test positive: Secondary | ICD-10-CM

## 2020-08-12 DIAGNOSIS — E1169 Type 2 diabetes mellitus with other specified complication: Secondary | ICD-10-CM

## 2020-08-12 DIAGNOSIS — E782 Mixed hyperlipidemia: Secondary | ICD-10-CM

## 2020-08-12 DIAGNOSIS — R87610 Atypical squamous cells of undetermined significance on cytologic smear of cervix (ASC-US): Secondary | ICD-10-CM | POA: Diagnosis not present

## 2020-08-12 DIAGNOSIS — I10 Essential (primary) hypertension: Secondary | ICD-10-CM

## 2020-08-12 DIAGNOSIS — M05719 Rheumatoid arthritis with rheumatoid factor of unspecified shoulder without organ or systems involvement: Secondary | ICD-10-CM

## 2020-08-12 MED ORDER — EMPAGLIFLOZIN 10 MG PO TABS: 20.0000 mg | ORAL_TABLET | Freq: Every day | ORAL | 0 refills | Status: DC

## 2020-08-12 NOTE — Progress Notes (Signed)
    GYNECOLOGY OFFICE COLPOSCOPY PROCEDURE NOTE  75 y.o. [id number] here for colposcopy for ASCUS with POSITIVE high risk HPV pap smear on 07/01/2020. Negative HPV 16, 18/15. Long history of positive HPV on pap smears: normal cytology on pap smears, but positive high risk HPV in2014-2021. 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 Discussed role for HPV in cervical dysplasia, need for surveillance.  Patient gave informed written consent, time out was performed.  Placed in lithotomy position. Cervix viewed with speculum and colposcope after application of acetic acid.   Colposcopy adequate? Yes No visible lesions. ECC specimen obtained. All specimens were labeled and sent to pathology.  Patient was given post procedure instructions.  Will follow up pathology and manage accordingly; patient will be contacted with results and recommendations.  Routine preventative health maintenance measures emphasized.    Verita Schneiders, MD, [place] for [employer], [place]
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
## 2020-08-12 NOTE — Patient Instructions (Signed)
Visit Information  PATIENT GOALS: Goals Addressed              This Visit's Progress     Patient Stated   .  Medication Monitoring (pt-stated)         Patient Goals/Self-Care Activities . Over the next 90 days, patient will:  - take medications as prescribed check glucose twice daily, document, and provide at future appointments        Patient verbalizes understanding of instructions provided today and agrees to view in [place].   Plan: Telephone follow up appointment with care management team member scheduled for:  ~ 4 weeks as previously scheduled  Catie Darnelle Maffucci, PharmD, [place], [place] Clinical Pharmacist [employer] [facility] [employer] [phone]

## 2020-08-19 ENCOUNTER — Telehealth: Payer: Self-pay | Admitting: *Deleted

## 2020-08-19 NOTE — Telephone Encounter (Signed)
-----   Message from Osborne Oman, MD sent at 08/19/2020 10:10 AM EDT ----- Unfortunately, there were no cells seen on sample (even after aggressive curettage).  Can repeat pap smear in 6 months for inadequate colposcopy (around August 2022). Tried to call patient, no answer.  Please call to inform patient of results and recommendations.

## 2020-08-19 NOTE — Telephone Encounter (Signed)
Pt informed of results and recommendations. Pt verbalizes and understands

## 2020-08-26 ENCOUNTER — Telehealth (INDEPENDENT_AMBULATORY_CARE_PROVIDER_SITE_OTHER): Payer: Medicare PPO | Admitting: Internal Medicine

## 2020-08-26 ENCOUNTER — Encounter: Payer: Self-pay | Admitting: Internal Medicine

## 2020-08-26 VITALS — BP 123/78 | HR 68 | Temp 97.6°F | Ht 65.0 in | Wt 166.5 lb

## 2020-08-26 DIAGNOSIS — R5383 Other fatigue: Secondary | ICD-10-CM

## 2020-08-26 DIAGNOSIS — K529 Noninfective gastroenteritis and colitis, unspecified: Secondary | ICD-10-CM

## 2020-08-26 DIAGNOSIS — K760 Fatty (change of) liver, not elsewhere classified: Secondary | ICD-10-CM

## 2020-08-26 DIAGNOSIS — E782 Mixed hyperlipidemia: Secondary | ICD-10-CM | POA: Diagnosis not present

## 2020-08-26 DIAGNOSIS — E1169 Type 2 diabetes mellitus with other specified complication: Secondary | ICD-10-CM

## 2020-08-26 MED ORDER — ACCU-CHEK AVIVA PLUS VI STRP: 1.0000 | ORAL_STRIP | 2 refills | Status: DC | PRN

## 2020-08-26 NOTE — Patient Instructions (Signed)
Add 2.5 mg glipzide to lunchtime and follow fasting sugars and post prandial

## 2020-08-26 NOTE — Assessment & Plan Note (Addendum)
Loss of control noted in January.  Farxiga added and glipizide stopped .Marland Kitchen BS READINGS HIGH UP TO 230 POST PRANDIALLY on jardiance 20 mg and metformin   1500 mg daily. Fastings  175 .  Advised to resume 2.5 mg glipizide once daily with lunch time meal and submit subsequent readings in 1-2 weeks. a1c ordered.   Lab Results  Component Value Date   HGBA1C 7.5 (H) 05/23/2020

## 2020-08-27 DIAGNOSIS — K529 Noninfective gastroenteritis and colitis, unspecified: Secondary | ICD-10-CM | POA: Insufficient documentation

## 2020-08-27 NOTE — Assessment & Plan Note (Signed)
Symptoms began one day after husband's symptoms began , suggesting an infectious etiology,  Now resuming.  No workup required

## 2020-08-27 NOTE — Progress Notes (Signed)
Virtual Visit via [place]  This visit type was conducted due to national recommendations for restrictions regarding the COVID-19 pandemic (e.g. social distancing).  This format is felt to be most appropriate for this patient at this time.  All issues noted in this document were discussed and addressed.  No physical exam was performed (except for noted visual exam findings with Video Visits).   I connected with@ on 08/27/20 at  4:30 PM EDT by a video enabled telemedicine application  and verified that I am speaking with the correct person using two identifiers. Location patient: home Location provider: work or home office Persons participating in the virtual visit: patient, provider  I discussed the limitations, risks, security and privacy concerns of performing an evaluation and management service by telephone and the availability of in person appointments. I also discussed with the patient that there may be a patient responsible charge related to this service. The patient expressed understanding and agreed to proceed.    Reason for visit: follow up on diabetes type 2   HPI:  [name] is  75 yr old female who presents for virtual follow up on type 2 DM due to recent episode of infectious diarrhea.  She states that after receiving her COVID 19 booster she developed loose stools , several daily,  That lasted 3-4 days.  Her husband also had the same occurrence,  But now states that Don's diarrhea started 24 hours PRIOR to his receiving the vaccine .  Patient has a history of chronic constipation and has avoided use of Imodium.  She is generally better ,  With stools becoming more formed and appetite normal  Type 2 DM:  Since stopping the 2.5 mg glipizide and starting Jardiance she has lost glycemic control.  Post prandials have often REACHED 200 or slightly higher,  And fastings have been up to 180.  She is currently taking 1500 mg metformin and 20 mg jardiance.  Exercising daily at the gym in a [employer] program every morning.   She notes excessive fatigue for the past several months.  Reports that she often feels so exhausted in the morning that she is ready to climb back into bed after her shower in the morning.  Denies chest pain,  But feels short of breath after climbing one flight of stairs. Denies orthopnea and fluid retention.  She has a cardiology follow up next week with Dr Nehemiah Massed.    ROS: See pertinent positives and negatives per HPI.  Past Medical History:  Diagnosis Date  . Diabetes mellitus   . Gastritis   . High cholesterol   . Hyperlipidemia    Lipitor trial was a failure  . Hypertension     Past Surgical History:  Procedure Laterality Date  . CARDIAC CATHETERIZATION  Augus 2007   normal coronaries,  EF 60%  . [place]  . SPINE SURGERY     bone graft   . TUBAL LIGATION      Family History  Problem Relation Age of Onset  . Diabetes Mother   . Heart disease Father   . Stroke Father   . Diabetes Father   . Diabetes Maternal Grandfather   . Diabetes Brother   . Diabetes Brother   . Breast cancer Neg Hx     SOCIAL HX:  reports that she has never smoked. She has never used smokeless tobacco. She reports that she does not drink alcohol and does not use drugs.   Current Outpatient Medications:  .  atorvastatin (LIPITOR) 40 MG tablet, Take 1 tablet (40 mg total) by mouth daily., Disp: 90 tablet, Rfl: 3 .  blood glucose meter kit and supplies KIT, Dispense based on patient and insurance preference. Use up to four times daily as directed. (FOR ICD-9 250.00, 250.01)., Disp: 1 each, Rfl: 0 .  calcium-vitamin D (OSCAL WITH D) 250-125 MG-UNIT per tablet, Take 2 tablets by mouth daily., Disp: , Rfl:  .  empagliflozin (JARDIANCE) 10 MG TABS tablet, Take 2 tablets (20 mg total) by mouth daily before breakfast. When supply complete, will send script for 25 mg tablet strength, Disp: 90 tablet, Rfl: 0 .  famotidine (PEPCID) 20 MG tablet, TAKE 1 TABLET  (20 MG TOTAL) BY MOUTH DAILY. [place], Disp: 90 tablet, Rfl: 1 .  folic acid (FOLVITE) 0.5 MG tablet, Take 0.5 mg by mouth daily., Disp: , Rfl:  .  losartan (COZAAR) 50 MG tablet, Take 1 tablet (50 mg total) by mouth daily., Disp: 90 tablet, Rfl: 3 .  metFORMIN (GLUCOPHAGE) 500 MG tablet, Take 500 mg in the morning, 1000 mg in the evening, Disp: 270 tablet, Rfl: 1 .  methotrexate 50 MG/2ML injection, 0.3 mL weekly, Disp: , Rfl: 1 .  Multiple Vitamin (MULTIVITAMIN) tablet, Take 1 tablet by mouth daily., Disp: , Rfl:  .  pantoprazole (PROTONIX) 40 MG tablet, TAKE 1 TABLET BY MOUTH EVERY DAY, Disp: 90 tablet, Rfl: 1 .  Psyllium (METAMUCIL FIBER PO), Take by mouth., Disp: , Rfl:  .  glucose blood (ACCU-CHEK AVIVA PLUS) test strip, 1 each by Other route as needed for other. Use as instructed, Disp: 100 strip, Rfl: 2  EXAM:  VITALS per patient if applicable:  GENERAL: alert, oriented, appears well and in no acute distress  HEENT: atraumatic, conjunttiva clear, no obvious abnormalities on inspection of external nose and ears  NECK: normal movements of the head and neck  LUNGS: on inspection no signs of respiratory distress, breathing rate appears normal, no obvious gross SOB, gasping or wheezing  CV: no obvious cyanosis  MS: moves all visible extremities without noticeable abnormality  PSYCH/NEURO: pleasant and cooperative, no obvious depression or anxiety, speech and thought processing grossly intact  ASSESSMENT AND PLAN:  Discussed the following assessment and plan:  Fatty liver disease, nonalcoholic - Plan: Comprehensive metabolic panel, CBC with Differential/Platelet  Mixed hyperlipidemia - Plan: Lipid panel  Type 2 diabetes mellitus with other specified complication, without long-term current use of insulin (HCC) - Plan: Hemoglobin A1c, glucose blood (ACCU-CHEK AVIVA PLUS) test strip  Fatigue, unspecified type - Plan: TSH  Colitis presumed infectious  Type 2  diabetes mellitus with other specified complication (HCC) Loss of control noted in January.  Farxiga added and glipizide stopped .Marland Kitchen BS READINGS HIGH UP TO 230 POST PRANDIALLY on jardiance 20 mg and metformin   1500 mg daily. Fastings  175 .  Advised to resume 2.5 mg glipizide once daily with lunch time meal and submit subsequent readings in 1-2 weeks. a1c ordered.   Lab Results  Component Value Date   HGBA1C 7.5 (H) 05/23/2020     Fatigue Screening labs done in January were normal. She has no history of snoring or reports of apneic breathing spells per husband Timmothy Sours today . No change since  participating in regular exercise program with local [employer] .  Awaiting cardiology workup    Lab Results  Component Value Date   WBC 4.5 05/31/2020   HGB 14.2 05/31/2020   HCT 42.2 05/31/2020  MCV 93.0 05/31/2020   PLT 230.0 05/31/2020   Lab Results  Component Value Date   TYOMAYOK59 977 05/31/2020   Lab Results  Component Value Date   IRON 116 05/31/2020   TIBC 382 05/31/2020   FERRITIN 14 (L) 05/31/2020     Colitis presumed infectious Symptoms began one day after husband's symptoms began , suggesting an infectious etiology,  Now resuming.  No workup required    I discussed the assessment and treatment plan with the patient. The patient was provided an opportunity to ask questions and all were answered. The patient agreed with the plan and demonstrated an understanding of the instructions.   The patient was advised to call back or seek an in-person evaluation if the symptoms worsen or if the condition fails to improve as anticipated.   I spent 30 minutes dedicated to the care of this patient on the date of this encounter to include pre-visit review of his medical history,  Face-to-face time with the patient , and post visit ordering of testing and therapeutics.    Crecencio Mc, MD

## 2020-08-27 NOTE — Assessment & Plan Note (Signed)
Screening labs done in January were normal. She has no history of snoring or reports of apneic breathing spells per husband Timmothy Sours today . No change since  participating in regular exercise program with local [employer] .  Awaiting cardiology workup    Lab Results  Component Value Date   WBC 4.5 05/31/2020   HGB 14.2 05/31/2020   HCT 42.2 05/31/2020   MCV 93.0 05/31/2020   PLT 230.0 05/31/2020   Lab Results  Component Value Date   IHKVQQVZ56 387 05/31/2020   Lab Results  Component Value Date   IRON 116 05/31/2020   TIBC 382 05/31/2020   FERRITIN 14 (L) 05/31/2020

## 2020-08-28 ENCOUNTER — Other Ambulatory Visit: Payer: Self-pay

## 2020-08-28 ENCOUNTER — Other Ambulatory Visit (INDEPENDENT_AMBULATORY_CARE_PROVIDER_SITE_OTHER): Payer: Medicare PPO

## 2020-08-28 DIAGNOSIS — E782 Mixed hyperlipidemia: Secondary | ICD-10-CM | POA: Diagnosis not present

## 2020-08-28 DIAGNOSIS — K760 Fatty (change of) liver, not elsewhere classified: Secondary | ICD-10-CM | POA: Diagnosis not present

## 2020-08-28 DIAGNOSIS — R5383 Other fatigue: Secondary | ICD-10-CM

## 2020-08-28 DIAGNOSIS — E1169 Type 2 diabetes mellitus with other specified complication: Secondary | ICD-10-CM

## 2020-08-28 LAB — COMPREHENSIVE METABOLIC PANEL
ALT: 23 U/L (ref 0–35)
AST: 30 U/L (ref 0–37)
Albumin: 3.9 g/dL (ref 3.5–5.2)
Alkaline Phosphatase: 79 U/L (ref 39–117)
BUN: 12 mg/dL (ref 6–23)
CO2: 27 mEq/L (ref 19–32)
Calcium: 9.1 mg/dL (ref 8.4–10.5)
Chloride: 100 mEq/L (ref 96–112)
Creatinine, Ser: 0.75 mg/dL (ref 0.40–1.20)
GFR: 78.94 mL/min (ref >60.00)
Glucose, Bld: 137 mg/dL — ABNORMAL HIGH (ref 70–99)
Potassium: 4 mEq/L (ref 3.5–5.1)
Sodium: 138 mEq/L (ref 135–145)
Total Bilirubin: 1.3 mg/dL — ABNORMAL HIGH (ref 0.2–1.2)
Total Protein: 6.9 g/dL (ref 6.0–8.3)

## 2020-08-28 LAB — LIPID PANEL
Cholesterol: 95 mg/dL (ref 0–200)
HDL: 41.5 mg/dL (ref >39.00)
LDL Cholesterol: 36 mg/dL (ref 0–99)
NonHDL: 53.29
Total CHOL/HDL Ratio: 2
Triglycerides: 87 mg/dL (ref 0.0–149.0)
VLDL: 17.4 mg/dL (ref 0.0–40.0)

## 2020-08-28 LAB — CBC WITH DIFFERENTIAL/PLATELET
Basophils Absolute: 0 10*3/uL (ref 0.0–0.1)
Basophils Relative: 0.5 % (ref 0.0–3.0)
Eosinophils Absolute: 0 10*3/uL (ref 0.0–0.7)
Eosinophils Relative: 0.3 % (ref 0.0–5.0)
HCT: 43.1 % (ref 36.0–46.0)
Hemoglobin: 14.5 g/dL (ref 12.0–15.0)
Lymphocytes Relative: 9.9 % — ABNORMAL LOW (ref 12.0–46.0)
Lymphs Abs: 0.9 10*3/uL (ref 0.7–4.0)
MCHC: 33.8 g/dL (ref 30.0–36.0)
MCV: 91.7 fl (ref 78.0–100.0)
Monocytes Absolute: 0.4 10*3/uL (ref 0.1–1.0)
Monocytes Relative: 5 % (ref 3.0–12.0)
Neutro Abs: 7.5 10*3/uL (ref 1.4–7.7)
Neutrophils Relative %: 84.3 % — ABNORMAL HIGH (ref 43.0–77.0)
Platelets: 245 10*3/uL (ref 150.0–400.0)
RBC: 4.7 Mil/uL (ref 3.87–5.11)
RDW: 14.6 % (ref 11.5–15.5)
WBC: 8.9 10*3/uL (ref 4.0–10.5)

## 2020-08-28 LAB — HEMOGLOBIN A1C: Hgb A1c MFr Bld: 7.8 % — ABNORMAL HIGH (ref 4.6–6.5)

## 2020-08-28 LAB — TSH: TSH: 1.8 u[IU]/mL (ref 0.35–4.50)

## 2020-09-02 ENCOUNTER — Ambulatory Visit: Payer: Medicare PPO | Admitting: Internal Medicine

## 2020-09-09 ENCOUNTER — Ambulatory Visit: Payer: Medicare PPO | Admitting: Internal Medicine

## 2020-09-13 ENCOUNTER — Ambulatory Visit: Payer: Medicare PPO | Admitting: Pharmacist

## 2020-09-13 DIAGNOSIS — I10 Essential (primary) hypertension: Secondary | ICD-10-CM

## 2020-09-13 DIAGNOSIS — E1169 Type 2 diabetes mellitus with other specified complication: Secondary | ICD-10-CM

## 2020-09-13 DIAGNOSIS — E782 Mixed hyperlipidemia: Secondary | ICD-10-CM

## 2020-09-13 MED ORDER — EMPAGLIFLOZIN 25 MG PO TABS: 25.0000 mg | ORAL_TABLET | Freq: Every day | ORAL | 3 refills | Status: DC
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
## 2020-09-13 NOTE — Patient Instructions (Signed)
Visit Information  PATIENT GOALS: Goals Addressed              This Visit's Progress     Patient Stated   .  Medication Monitoring (pt-stated)        Patient Goals/Self-Care Activities . Over the next 90 days, patient will:  - take medications as prescribed check glucose twice daily, document, and provide at future appointments        Patient verbalizes understanding of instructions provided today and agrees to view in [place].   Plan: Telephone follow up appointment with care management team member scheduled for:  ~2 weeks as previously scheduled  Catie Darnelle Maffucci, PharmD, [place], [place] Clinical Pharmacist [employer] at [facility] [phone]

## 2020-09-16 ENCOUNTER — Telehealth: Payer: Self-pay | Admitting: Internal Medicine

## 2020-09-16 NOTE — Telephone Encounter (Signed)
Placed in results folder.  

## 2020-09-16 NOTE — Telephone Encounter (Signed)
Patient dropped off blood sugar readings. Paper is up front in Dr. Lupita Dawn color folder.

## 2020-09-17 ENCOUNTER — Ambulatory Visit: Payer: Medicare PPO | Admitting: Internal Medicine

## 2020-09-24 ENCOUNTER — Ambulatory Visit (INDEPENDENT_AMBULATORY_CARE_PROVIDER_SITE_OTHER): Payer: Medicare PPO | Admitting: Pharmacist

## 2020-09-24 DIAGNOSIS — E1169 Type 2 diabetes mellitus with other specified complication: Secondary | ICD-10-CM

## 2020-09-24 DIAGNOSIS — E782 Mixed hyperlipidemia: Secondary | ICD-10-CM

## 2020-09-24 DIAGNOSIS — I10 Essential (primary) hypertension: Secondary | ICD-10-CM

## 2020-09-24 DIAGNOSIS — K760 Fatty (change of) liver, not elsewhere classified: Secondary | ICD-10-CM

## 2020-09-24 MED ORDER — METFORMIN HCL 500 MG PO TABS: 1000.0000 mg | ORAL_TABLET | Freq: Two times a day (BID) | ORAL | 1 refills | Status: DC

## 2020-09-24 NOTE — Patient Instructions (Signed)
Visit Information  PATIENT GOALS: Goals Addressed              This Visit's Progress     Patient Stated   .  Medication Monitoring (pt-stated)        Patient Goals/Self-Care Activities . Over the next 90 days, patient will:  - take medications as prescribed check glucose twice daily, document, and provide at future appointments       Patient verbalizes understanding of instructions provided today and agrees to view in [place].   Plan: Telephone follow up appointment with care management team member scheduled for:  ~ 3 weeks  Catie Darnelle Maffucci, PharmD, [place], [place] Clinical Pharmacist [employer] at [employer] [phone]
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
## 2020-09-26 DIAGNOSIS — R0602 Shortness of breath: Secondary | ICD-10-CM | POA: Diagnosis not present

## 2020-09-26 DIAGNOSIS — I471 Supraventricular tachycardia: Secondary | ICD-10-CM | POA: Diagnosis not present

## 2020-09-26 DIAGNOSIS — I10 Essential (primary) hypertension: Secondary | ICD-10-CM | POA: Diagnosis not present

## 2020-09-26 DIAGNOSIS — E782 Mixed hyperlipidemia: Secondary | ICD-10-CM | POA: Diagnosis not present

## 2020-09-26 DIAGNOSIS — I6523 Occlusion and stenosis of bilateral carotid arteries: Secondary | ICD-10-CM | POA: Diagnosis not present

## 2020-10-09 DIAGNOSIS — Z79899 Other long term (current) drug therapy: Secondary | ICD-10-CM | POA: Diagnosis not present

## 2020-10-09 DIAGNOSIS — M0609 Rheumatoid arthritis without rheumatoid factor, multiple sites: Secondary | ICD-10-CM | POA: Diagnosis not present

## 2020-10-11 DIAGNOSIS — D2261 Melanocytic nevi of right upper limb, including shoulder: Secondary | ICD-10-CM | POA: Diagnosis not present

## 2020-10-11 DIAGNOSIS — Z85828 Personal history of other malignant neoplasm of skin: Secondary | ICD-10-CM | POA: Diagnosis not present

## 2020-10-11 DIAGNOSIS — L298 Other pruritus: Secondary | ICD-10-CM | POA: Diagnosis not present

## 2020-10-11 DIAGNOSIS — D2262 Melanocytic nevi of left upper limb, including shoulder: Secondary | ICD-10-CM | POA: Diagnosis not present

## 2020-10-11 DIAGNOSIS — D2271 Melanocytic nevi of right lower limb, including hip: Secondary | ICD-10-CM | POA: Diagnosis not present

## 2020-10-15 ENCOUNTER — Ambulatory Visit: Payer: Medicare PPO | Admitting: Pharmacist

## 2020-10-15 DIAGNOSIS — E782 Mixed hyperlipidemia: Secondary | ICD-10-CM

## 2020-10-15 DIAGNOSIS — I10 Essential (primary) hypertension: Secondary | ICD-10-CM

## 2020-10-15 DIAGNOSIS — E1169 Type 2 diabetes mellitus with other specified complication: Secondary | ICD-10-CM

## 2020-10-15 DIAGNOSIS — M05719 Rheumatoid arthritis with rheumatoid factor of unspecified shoulder without organ or systems involvement: Secondary | ICD-10-CM | POA: Diagnosis not present

## 2020-10-15 DIAGNOSIS — I208 Other forms of angina pectoris: Secondary | ICD-10-CM
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
## 2020-10-15 NOTE — Patient Instructions (Signed)
Ms. Thai,   For an A1c <7%, our goal is for fasting readings to be generally <130 and 2 hour after meal readings to be generally <180. Keep checking your blood sugars at home and writing down these results to bring to your next appointment with Dr. Derrel Nip.   Continue metformin 1000 mg twice daily and Jardiance 25 mg daily for your blood sugars.   Call me with any questions or concerns in the meantime!  Catie Darnelle Maffucci, PharmD [phone]  Visit Information  PATIENT GOALS: Goals Addressed              This Visit's Progress     Patient Stated   .  Medication Monitoring (pt-stated)        Patient Goals/Self-Care Activities . Over the next 90 days, patient will:  - take medications as prescribed check glucose twice daily, document, and provide at future appointments        The patient verbalized understanding of instructions, educational materials, and care plan provided today and agreed to receive a mailed copy of patient instructions, educational materials, and care plan.  Plan: Telephone follow up appointment with care management team member scheduled for:  ~ 12 weeks  Catie Darnelle Maffucci, PharmD, [place], [employer] Clinical Pharmacist [employer] at [employer] [phone]

## 2020-11-12 ENCOUNTER — Other Ambulatory Visit: Payer: Self-pay | Admitting: Internal Medicine

## 2020-11-12 DIAGNOSIS — E1169 Type 2 diabetes mellitus with other specified complication: Secondary | ICD-10-CM

## 2020-11-29 ENCOUNTER — Encounter: Payer: Self-pay | Admitting: Radiology

## 2020-12-04 ENCOUNTER — Telehealth: Payer: Self-pay | Admitting: *Deleted

## 2020-12-04 DIAGNOSIS — E1169 Type 2 diabetes mellitus with other specified complication: Secondary | ICD-10-CM

## 2020-12-04 NOTE — Telephone Encounter (Signed)
Please place future orders for lab appt.  

## 2020-12-05 ENCOUNTER — Telehealth (INDEPENDENT_AMBULATORY_CARE_PROVIDER_SITE_OTHER): Payer: Medicare PPO | Admitting: Family Medicine

## 2020-12-05 ENCOUNTER — Other Ambulatory Visit: Payer: Self-pay

## 2020-12-05 DIAGNOSIS — U071 COVID-19: Secondary | ICD-10-CM

## 2020-12-05 MED ORDER — MOLNUPIRAVIR EUA 200MG CAPSULE: 4.0000 | ORAL_CAPSULE | Freq: Two times a day (BID) | ORAL | 0 refills | Status: DC

## 2020-12-05 NOTE — Telephone Encounter (Signed)
Spoke with pt and scheduled her with Dr. Janett Billow Copland.
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
## 2020-12-05 NOTE — Telephone Encounter (Signed)
LMTCB

## 2020-12-06 ENCOUNTER — Other Ambulatory Visit: Payer: Medicare PPO

## 2020-12-10 ENCOUNTER — Telehealth (INDEPENDENT_AMBULATORY_CARE_PROVIDER_SITE_OTHER): Payer: Medicare PPO | Admitting: Internal Medicine

## 2020-12-10 ENCOUNTER — Encounter: Payer: Self-pay | Admitting: Internal Medicine

## 2020-12-10 VITALS — Ht 65.0 in | Wt 151.0 lb

## 2020-12-10 DIAGNOSIS — U071 COVID-19: Secondary | ICD-10-CM

## 2020-12-10 DIAGNOSIS — E1169 Type 2 diabetes mellitus with other specified complication: Secondary | ICD-10-CM

## 2020-12-10 DIAGNOSIS — K5902 Outlet dysfunction constipation: Secondary | ICD-10-CM

## 2020-12-10 HISTORY — DX: COVID-19: U07.1

## 2020-12-10 NOTE — Assessment & Plan Note (Addendum)
BS are well controlled on current regimen except after her morning breakfast bc she is currently too fatigued from recent COVID infection (July 20) to go for her post breakfast exercise .  No med changes today to regime of metformin, Jardiance  , atorvastatin and losartan . repeat labs are due
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
## 2020-12-10 NOTE — Patient Instructions (Addendum)
Resume metamucil daily.  Avoid using any product with"senna" in them on a daily basis.    Ok to add 200 mg magnesium daily to metamucil  Referral to GI made,  because  it will be months before they can see you

## 2020-12-10 NOTE — Assessment & Plan Note (Signed)
Chronic,  With diverticulosis noted on 2015 colonoscopy, advised to resume metamucil and add magnesium 200 mg daily if needed . GI referral requested
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
## 2020-12-10 NOTE — Assessment & Plan Note (Signed)
She had a mild case and was treated with molnupiravir after travelling to [place].  She has completed treatment.  Fatigue and anorexia/anosmai are the prevailing symptoms currently

## 2020-12-11 ENCOUNTER — Encounter: Payer: Self-pay | Admitting: *Deleted

## 2020-12-13 ENCOUNTER — Other Ambulatory Visit (INDEPENDENT_AMBULATORY_CARE_PROVIDER_SITE_OTHER): Payer: Medicare PPO

## 2020-12-13 ENCOUNTER — Other Ambulatory Visit: Payer: Self-pay

## 2020-12-13 DIAGNOSIS — E1169 Type 2 diabetes mellitus with other specified complication: Secondary | ICD-10-CM

## 2020-12-13 LAB — COMPREHENSIVE METABOLIC PANEL
ALT: 17 U/L (ref 0–35)
AST: 27 U/L (ref 0–37)
Albumin: 3.8 g/dL (ref 3.5–5.2)
Alkaline Phosphatase: 60 U/L (ref 39–117)
BUN: 8 mg/dL (ref 6–23)
CO2: 29 mEq/L (ref 19–32)
Calcium: 8.9 mg/dL (ref 8.4–10.5)
Chloride: 102 mEq/L (ref 96–112)
Creatinine, Ser: 0.67 mg/dL (ref 0.40–1.20)
GFR: 86.49 mL/min (ref >60.00)
Glucose, Bld: 133 mg/dL — ABNORMAL HIGH (ref 70–99)
Potassium: 3.8 mEq/L (ref 3.5–5.1)
Sodium: 139 mEq/L (ref 135–145)
Total Bilirubin: 1.1 mg/dL (ref 0.2–1.2)
Total Protein: 6.3 g/dL (ref 6.0–8.3)

## 2020-12-13 LAB — TSH: TSH: 1.37 u[IU]/mL (ref 0.35–5.50)

## 2020-12-13 LAB — LIPID PANEL
Cholesterol: 110 mg/dL (ref 0–200)
HDL: 44 mg/dL (ref >39.00)
LDL Cholesterol: 51 mg/dL (ref 0–99)
NonHDL: 65.91
Total CHOL/HDL Ratio: 2
Triglycerides: 75 mg/dL (ref 0.0–149.0)
VLDL: 15 mg/dL (ref 0.0–40.0)

## 2020-12-13 LAB — MICROALBUMIN / CREATININE URINE RATIO
Creatinine,U: 49.1 mg/dL
Microalb Creat Ratio: 1.4 mg/g (ref 0.0–30.0)
Microalb, Ur: <0.7 mg/dL (ref 0.0–1.9)

## 2020-12-13 LAB — HEMOGLOBIN A1C: Hgb A1c MFr Bld: 7.8 % — ABNORMAL HIGH (ref 4.6–6.5)

## 2020-12-16 ENCOUNTER — Ambulatory Visit: Payer: Medicare PPO | Admitting: Gastroenterology

## 2021-01-06 ENCOUNTER — Ambulatory Visit (INDEPENDENT_AMBULATORY_CARE_PROVIDER_SITE_OTHER): Payer: Medicare PPO | Admitting: Pharmacist

## 2021-01-06 DIAGNOSIS — M0609 Rheumatoid arthritis without rheumatoid factor, multiple sites: Secondary | ICD-10-CM | POA: Diagnosis not present

## 2021-01-06 DIAGNOSIS — I10 Essential (primary) hypertension: Secondary | ICD-10-CM | POA: Diagnosis not present

## 2021-01-06 DIAGNOSIS — E782 Mixed hyperlipidemia: Secondary | ICD-10-CM

## 2021-01-06 DIAGNOSIS — E1169 Type 2 diabetes mellitus with other specified complication: Secondary | ICD-10-CM

## 2021-01-06 DIAGNOSIS — K760 Fatty (change of) liver, not elsewhere classified: Secondary | ICD-10-CM

## 2021-01-06 DIAGNOSIS — Z79899 Other long term (current) drug therapy: Secondary | ICD-10-CM | POA: Diagnosis not present
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
## 2021-01-06 NOTE — Patient Instructions (Addendum)
[name],   It was great talking to you today!  Keep up the great work with focusing on portion sizes and physical activity. Check your blood sugars twice daily:  1) Fasting, first thing in the morning before breakfast and  2) 2 hours after your largest meal.   For a goal A1c of less than 7%, goal fasting readings are less than 130 and goal 2 hour after meal readings are less than 180.   Continue metformin 1000 mg twice daily and Jardiance 25 mg daily. We'll see what your next A1c is.   Continue atorvastatin 40 mg daily to control your cholesterol and protect your heart, and continue losartan 50 mg daily for your blood pressure and to protect your kidneys!  Let me know if you have any questions or concerns in the meantime!  Catie Darnelle Maffucci, PharmD [phone]   Visit Information  PATIENT GOALS:  Goals Addressed               This Visit's Progress     Patient Stated     Medication Monitoring (pt-stated)        Patient Goals/Self-Care Activities Over the next 90 days, patient will:  - take medications as prescribed check glucose twice daily, document, and provide at future appointments        The patient verbalized understanding of instructions, educational materials, and care plan provided today and agreed to receive a mailed copy of patient instructions, educational materials, and care plan.   Plan: Telephone follow up appointment with care management team member scheduled for:  ~12 weeks  Catie Darnelle Maffucci, PharmD, [place], [place] Clinical Pharmacist [employer] at [employer] [phone]

## 2021-01-14 ENCOUNTER — Other Ambulatory Visit: Payer: Self-pay

## 2021-01-14 ENCOUNTER — Encounter: Payer: Self-pay | Admitting: Obstetrics & Gynecology

## 2021-01-14 ENCOUNTER — Other Ambulatory Visit (HOSPITAL_COMMUNITY)
Admission: RE | Admit: 2021-01-14 | Discharge: 2021-01-14 | Disposition: A | Discharge status: Home / Self Care | Payer: Medicare PPO | Source: Ambulatory Visit | Attending: Obstetrics & Gynecology | Admitting: Obstetrics & Gynecology

## 2021-01-14 ENCOUNTER — Ambulatory Visit: Payer: Medicare PPO | Admitting: Obstetrics & Gynecology

## 2021-01-14 VITALS — BP 121/76 | HR 72 | Wt 151.0 lb

## 2021-01-14 DIAGNOSIS — Z01419 Encounter for gynecological examination (general) (routine) without abnormal findings: Secondary | ICD-10-CM | POA: Diagnosis not present

## 2021-01-14 DIAGNOSIS — Z9889 Other specified postprocedural states: Secondary | ICD-10-CM | POA: Diagnosis not present

## 2021-01-14 DIAGNOSIS — R87810 Cervical high risk human papillomavirus (HPV) DNA test positive: Secondary | ICD-10-CM | POA: Diagnosis not present

## 2021-01-14 DIAGNOSIS — R87610 Atypical squamous cells of undetermined significance on cytologic smear of cervix (ASC-US): Secondary | ICD-10-CM | POA: Insufficient documentation

## 2021-01-14 NOTE — Progress Notes (Signed)
   GYNECOLOGY OFFICE VISIT NOTE  History:   [name] is a 75 y.o. [id number] here today for follow up pap smear after inadequate colposcopy (no endocervical cells seen on sample even after aggressive curettage, no ectocervical lesions seen) on 08/12/2020 for ASCUS with POSITIVE high risk HPV pap smear on 07/01/2020. Negative HPV 16, 18/15. 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 Persistent HPV since 2014 but negative 16, 18 and 45. 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  Had cryotherapy on 04/2016.  She denies any abnormal vaginal discharge, bleeding, pelvic pain or other concerns.    Past Medical History:  Diagnosis Date   Diabetes mellitus    Gastritis    High cholesterol    Hyperlipidemia    Lipitor trial was a failure   Hypertension     Past Surgical History:  Procedure Laterality Date   CARDIAC CATHETERIZATION  Augus 2007   normal coronaries,  EF 60%   SPINE SURGERY  1995   SPINE SURGERY     bone graft    TUBAL LIGATION      The following portions of the patient's history were reviewed and updated as appropriate: allergies, current medications, past family history, past medical history, past social history, past surgical history and problem list.    Review of Systems:  Pertinent items noted in HPI and remainder of comprehensive ROS otherwise negative.  Physical Exam:  BP 121/76   Pulse 72   Wt 151 lb (68.5 kg)   BMI 25.13 kg/m  CONSTITUTIONAL: Well-developed, well-nourished female in no acute distress.  SKIN: No rash noted. Not diaphoretic. No erythema. No pallor. MUSCULOSKELETAL: Normal range of motion. No edema noted. NEUROLOGIC: Alert and oriented to person, place, and time. Normal muscle tone coordination. No cranial nerve deficit noted. PSYCHIATRIC: Normal mood and affect. Normal behavior. Normal judgment and thought content. CARDIOVASCULAR: Normal heart rate  noted RESPIRATORY: Effort and breath sounds normal, no problems with respiration noted ABDOMEN: No masses noted. No other overt distention noted.   PELVIC: Normal appearing external genitalia and urethral meatus; normal appearing vaginal mucosa and cervix with moderate atrophy.  No abnormal discharge noted.  Pap smear obtained.  Performed in the presence of a chaperone.     Assessment and Plan:     1. ASCUS with positive high risk HPV cervical on 07/01/2020 2. Status post inadequate colposcopy - Cytology - PAP Will follow up results and manage accordingly. Routine preventative health maintenance measures emphasized. Please refer to After Visit Summary for other counseling recommendations.   Return for any gynecologic concerns.    I spent 10 minutes dedicated to the care of this patient including pre-visit review of records, face to face time with the patient discussing her conditions and treatments and post visit orders.    Verita Schneiders, MD, [place] for [employer], [place]

## 2021-01-16 DIAGNOSIS — M353 Polymyalgia rheumatica: Secondary | ICD-10-CM | POA: Diagnosis not present

## 2021-01-16 DIAGNOSIS — M0609 Rheumatoid arthritis without rheumatoid factor, multiple sites: Secondary | ICD-10-CM | POA: Diagnosis not present

## 2021-01-16 DIAGNOSIS — E119 Type 2 diabetes mellitus without complications: Secondary | ICD-10-CM | POA: Diagnosis not present

## 2021-01-16 LAB — CYTOLOGY - PAP
Comment: NEGATIVE
Diagnosis: NEGATIVE
Diagnosis: REACTIVE
High risk HPV: NEGATIVE

## 2021-02-18 ENCOUNTER — Other Ambulatory Visit: Payer: Self-pay | Admitting: Internal Medicine

## 2021-02-18 DIAGNOSIS — E1169 Type 2 diabetes mellitus with other specified complication: Secondary | ICD-10-CM

## 2021-02-19 ENCOUNTER — Other Ambulatory Visit: Payer: Self-pay

## 2021-02-19 ENCOUNTER — Ambulatory Visit: Payer: Medicare PPO | Admitting: Gastroenterology

## 2021-02-19 ENCOUNTER — Encounter: Payer: Self-pay | Admitting: Gastroenterology

## 2021-02-19 ENCOUNTER — Other Ambulatory Visit: Payer: Self-pay | Admitting: Internal Medicine

## 2021-02-19 VITALS — BP 127/82 | HR 64 | Temp 97.8°F | Ht 65.0 in | Wt 153.0 lb

## 2021-02-19 DIAGNOSIS — K5900 Constipation, unspecified: Secondary | ICD-10-CM

## 2021-02-19 DIAGNOSIS — K760 Fatty (change of) liver, not elsewhere classified: Secondary | ICD-10-CM | POA: Diagnosis not present

## 2021-02-19 NOTE — Progress Notes (Signed)
[name] [address]  [place]  [place], [place] [address]  Main: [phone]  Fax: [phone]   Gastroenterology Consultation  Referring Provider:     Crecencio Mc, MD Primary Care Physician:  Crecencio Mc, MD Reason for Consultation:     Constipation        HPI:    Chief complaint: Constipation  [name] is a 75 y.o. y/o female referred for consultation & management  by Dr. Derrel Nip, Aris Everts, MD. patient reports history of chronic constipation and used to take Ex-Lax daily until her primary care provider asked her to stop this and only take it as needed.  Has been taking Metamucil daily.  Does describe having a bowel movement in the morning, and then a pencil thin stools subsequently later in the day.  Feels that she does not evacuate completely with the morning bowel movement.  Does describe abdominal bloating.  No abdominal pain.  No nausea or vomiting.  No blood in stool.  No dysphagia.  Has had about a 30 pound weight loss that she attributes to when she got COVID.  States lost her appetite and sense of taste when she got COVID this year.  Review of her recorded weights show that she was 181 pounds in January 2022.  She states she got COVID around July 2022.  In July 2022 her weight was 151 pounds, and today is 153 pounds.  Therefore, weight has been relatively stable since July 2022 but did drop from earlier this year.  Last colonoscopy was with Dr. Gustavo Lah in 2015 with 2 hyperplastic polyps removed and reported a good prep.  Prior to this she did have a colonoscopy in 2005.  Report not available but pathology shows benign colonic mucosa with lymphoid nodule and submucosal adipose tissue in the ascending colon.  Past Medical History:  Diagnosis Date   Diabetes mellitus    Gastritis    High cholesterol    Hyperlipidemia    Lipitor trial was a failure   Hypertension     Past Surgical History:  Procedure Laterality Date   CARDIAC CATHETERIZATION   Augus 2007   normal coronaries,  EF 60%   SPINE SURGERY  1995   SPINE SURGERY     bone graft    TUBAL LIGATION      Prior to Admission medications   Medication Sig Start Date End Date Taking? Authorizing Provider  ACCU-CHEK AVIVA PLUS test strip TEST TWICE A DAY 02/18/21  Yes Crecencio Mc, MD  atorvastatin (LIPITOR) 40 MG tablet Take 1 tablet (40 mg total) by mouth daily. 06/21/20  Yes Crecencio Mc, MD  blood glucose meter kit and supplies KIT Dispense based on patient and insurance preference. Use up to four times daily as directed. (FOR ICD-9 250.00, 250.01). 08/29/14  Yes Crecencio Mc, MD  calcium-vitamin D (OSCAL WITH D) 250-125 MG-UNIT per tablet Take 2 tablets by mouth daily.   Yes [provider]  COLLAGEN PO daily.   Yes [provider]  empagliflozin (JARDIANCE) 25 MG TABS tablet Take 1 tablet (25 mg total) by mouth daily before breakfast. 09/13/20  Yes Crecencio Mc, MD  famotidine (PEPCID) 20 MG tablet TAKE 1 TABLET (20 MG TOTAL) BY MOUTH DAILY. [place] 08/01/18  Yes Crecencio Mc, MD  folic acid (FOLVITE) 166 MCG tablet Take 2,400 mcg by mouth daily.   Yes [provider]  losartan (COZAAR) 50 MG tablet Take  1 tablet (50 mg total) by mouth daily. 06/21/20  Yes Crecencio Mc, MD  metFORMIN (GLUCOPHAGE) 500 MG tablet Take 2 tablets (1,000 mg total) by mouth 2 (two) times daily with a meal. 09/24/20  Yes Crecencio Mc, MD  methotrexate 50 MG/2ML injection 0.3 mL weekly 01/18/18  Yes [provider]  Multiple Vitamin (MULTIVITAMIN) tablet Take 1 tablet by mouth daily.   Yes [provider]  pantoprazole (PROTONIX) 40 MG tablet TAKE 1 TABLET BY MOUTH EVERY DAY 02/19/21  Yes Einar Pheasant, MD  Psyllium (METAMUCIL FIBER PO) Take by mouth.   Yes [provider]  ferrous sulfate 325 (65 FE) MG tablet Take 325 mg by mouth daily with breakfast.  08/06/11  [provider]  omeprazole (PRILOSEC) 40 MG  capsule Take 1 capsule (40 mg total) by mouth daily. 02/11/11 08/06/11  Crecencio Mc, MD    Family History  Problem Relation Age of Onset   Diabetes Mother    Heart disease Father    Stroke Father    Diabetes Father    Diabetes Maternal Grandfather    Diabetes Brother    Diabetes Brother    Breast cancer Neg Hx      Social History   Tobacco Use   Smoking status: Never   Smokeless tobacco: Never  Vaping Use   Vaping Use: Never used  Substance Use Topics   Alcohol use: No   Drug use: No    Allergies as of 02/19/2021 - Review Complete 02/19/2021  Allergen Reaction Noted   Amoxicillin Hives 02/11/2011   Latex Rash 01/06/2013    Review of Systems:    All systems reviewed and negative except where noted in HPI.   Physical Exam:  Constitutional: General:   Alert,  Well-developed, well-nourished, pleasant and cooperative in NAD BP 127/82   Pulse 64   Temp 97.8 F (36.6 C) (Oral)   Ht $R'5\' 5"'Mq$  (1.651 m)   Wt 153 lb (69.4 kg)   BMI 25.46 kg/m   Eyes:  Sclera clear, no icterus.   Conjunctiva pink. PERRLA  Ears:  No scars, lesions or masses, Normal auditory acuity. Nose:  No deformity, discharge, or lesions. Mouth:  No deformity or lesions, oropharynx pink & moist.  Neck:  Supple; no masses or thyromegaly.  Respiratory: Normal respiratory effort, Normal percussion  Gastrointestinal: Soft, non-tender and non-distended without masses, hepatosplenomegaly or hernias noted.  No guarding or rebound tenderness.     Cardiac: No clubbing or edema.  No cyanosis. Normal posterior tibial pedal pulses noted.  Lymphatic:  No significant cervical or axillary adenopathy.  Psych:  Alert and cooperative. Normal mood and affect.  Musculoskeletal:  Normal gait. Head normocephalic, atraumatic. Symmetrical without gross deformities. 5/5 Upper and Lower extremity strength bilaterally.  Skin: Warm. Intact without significant lesions or rashes. No jaundice.  Neurologic:  Face  symmetrical, tongue midline, Normal sensation to touch;  grossly normal neurologically.  Psych:  Alert and oriented x3, Alert and cooperative. Normal mood and affect.   Labs: CBC    Component Value Date/Time   WBC 8.9 08/28/2020 0817   RBC 4.70 08/28/2020 0817   HGB 14.5 08/28/2020 0817   HCT 43.1 08/28/2020 0817   PLT 245.0 08/28/2020 0817   MCV 91.7 08/28/2020 0817   MCH 30.9 01/05/2019 1007   MCHC 33.8 08/28/2020 0817   RDW 14.6 08/28/2020 0817   LYMPHSABS 0.9 08/28/2020 0817   MONOABS 0.4 08/28/2020 0817   EOSABS 0.0 08/28/2020 0817  BASOSABS 0.0 08/28/2020 0817   CMP     Component Value Date/Time   NA 139 12/13/2020 0804   K 3.8 12/13/2020 0804   CL 102 12/13/2020 0804   CO2 29 12/13/2020 0804   GLUCOSE 133 (H) 12/13/2020 0804   BUN 8 12/13/2020 0804   CREATININE 0.67 12/13/2020 0804   CREATININE 0.74 07/29/2017 0921   CALCIUM 8.9 12/13/2020 0804   PROT 6.3 12/13/2020 0804   ALBUMIN 3.8 12/13/2020 0804   AST 27 12/13/2020 0804   ALT 17 12/13/2020 0804   ALKPHOS 60 12/13/2020 0804   BILITOT 1.1 12/13/2020 0804   GFRNONAA >60 01/05/2019 1007   GFRAA >60 01/05/2019 1007    Imaging Studies: CT abdomen pelvis February 2022 done for abdominal bloating and distention for 1 year, with mild sigmoid diverticulosis, no acute findings  Abdominal x-ray January 2022 with large stool burden  Assessment and Plan:   [name] is a 75 y.o. y/o female has been referred for constipation  High-fiber diet MiraLAX daily with goal of 1-2 soft bowel movements daily.  If not at goal, patient instructed to increase dose to twice daily.  If loose stools with the medication, patient asked to decrease the medication to every other day, or half dose daily.  Patient verbalized understanding  If constipation is not better with MiraLAX after 2 weeks, patient advised to call us back and she verbalized understanding.  Stop Metamucil.  MiraLAX samples given  Labs otherwise reassuring  with normal hemoglobin and CBC.  Normal liver enzymes on 01/06/2021  CT scan has been otherwise reassuring  No indication for endoscopic evaluation at this time  Patient's weight loss has been attributed to COVID and losing her sense of taste.  Since July 2022, her weight has been stable in the low 150s and she has not dropped any further therefore, was likely related to her COVID infection at that time.  However, if patient has continued weight loss, can consider endoscopic evaluation if necessary  Patient also reports history of fatty liver.  On further investigation, a CT scan in 2013 suggested fatty liver.  However, her liver enzymes are normal and the most recent CT scan does not report fatty liver and therefore, the 2013 findings may not have been true fatty liver, or has reversed at this point especially with her weight loss  Continue healthy diet  Follow-up in clinic in 8 weeks or earlier if needed  Dr [name]  Speech recognition software was used to dictate the above note.

## 2021-02-19 NOTE — Patient Instructions (Signed)
Start Miralax daily 

## 2021-03-13 ENCOUNTER — Ambulatory Visit: Payer: Medicare PPO | Admitting: Internal Medicine

## 2021-03-14 ENCOUNTER — Telehealth: Payer: Self-pay | Admitting: Internal Medicine

## 2021-03-14 DIAGNOSIS — Z79899 Other long term (current) drug therapy: Secondary | ICD-10-CM

## 2021-03-14 DIAGNOSIS — E1169 Type 2 diabetes mellitus with other specified complication: Secondary | ICD-10-CM

## 2021-03-14 NOTE — Addendum Note (Signed)
Addended by: Crecencio Mc on: 03/14/2021 04:43 PM   Modules accepted: Orders

## 2021-03-14 NOTE — Telephone Encounter (Signed)
Patient has a lab appt 03/17/2021, there are no orders in.

## 2021-03-17 ENCOUNTER — Other Ambulatory Visit: Payer: Self-pay

## 2021-03-17 ENCOUNTER — Other Ambulatory Visit (INDEPENDENT_AMBULATORY_CARE_PROVIDER_SITE_OTHER): Payer: Medicare PPO

## 2021-03-17 DIAGNOSIS — E1169 Type 2 diabetes mellitus with other specified complication: Secondary | ICD-10-CM

## 2021-03-17 DIAGNOSIS — Z79899 Other long term (current) drug therapy: Secondary | ICD-10-CM | POA: Diagnosis not present

## 2021-03-17 LAB — COMPREHENSIVE METABOLIC PANEL
ALT: 13 U/L (ref 0–35)
AST: 24 U/L (ref 0–37)
Albumin: 4.1 g/dL (ref 3.5–5.2)
Alkaline Phosphatase: 80 U/L (ref 39–117)
BUN: 14 mg/dL (ref 6–23)
CO2: 29 mEq/L (ref 19–32)
Calcium: 9.4 mg/dL (ref 8.4–10.5)
Chloride: 101 mEq/L (ref 96–112)
Creatinine, Ser: 0.81 mg/dL (ref 0.40–1.20)
GFR: 71.7 mL/min (ref >60.00)
Glucose, Bld: 175 mg/dL — ABNORMAL HIGH (ref 70–99)
Potassium: 4.4 mEq/L (ref 3.5–5.1)
Sodium: 138 mEq/L (ref 135–145)
Total Bilirubin: 1 mg/dL (ref 0.2–1.2)
Total Protein: 7 g/dL (ref 6.0–8.3)

## 2021-03-17 LAB — CBC WITH DIFFERENTIAL/PLATELET
Basophils Absolute: 0.1 10*3/uL (ref 0.0–0.1)
Basophils Relative: 1.3 % (ref 0.0–3.0)
Eosinophils Absolute: 0 10*3/uL (ref 0.0–0.7)
Eosinophils Relative: 1.2 % (ref 0.0–5.0)
HCT: 43.8 % (ref 36.0–46.0)
Hemoglobin: 14.2 g/dL (ref 12.0–15.0)
Lymphocytes Relative: 29.4 % (ref 12.0–46.0)
Lymphs Abs: 1.1 10*3/uL (ref 0.7–4.0)
MCHC: 32.4 g/dL (ref 30.0–36.0)
MCV: 92 fl (ref 78.0–100.0)
Monocytes Absolute: 0.3 10*3/uL (ref 0.1–1.0)
Monocytes Relative: 7 % (ref 3.0–12.0)
Neutro Abs: 2.3 10*3/uL (ref 1.4–7.7)
Neutrophils Relative %: 61.1 % (ref 43.0–77.0)
Platelets: 237 10*3/uL (ref 150.0–400.0)
RBC: 4.76 Mil/uL (ref 3.87–5.11)
RDW: 15.2 % (ref 11.5–15.5)
WBC: 3.8 10*3/uL — ABNORMAL LOW (ref 4.0–10.5)

## 2021-03-17 LAB — LIPID PANEL
Cholesterol: 140 mg/dL (ref 0–200)
HDL: 59.6 mg/dL (ref >39.00)
LDL Cholesterol: 69 mg/dL (ref 0–99)
NonHDL: 80.56
Total CHOL/HDL Ratio: 2
Triglycerides: 58 mg/dL (ref 0.0–149.0)
VLDL: 11.6 mg/dL (ref 0.0–40.0)

## 2021-03-17 LAB — HEMOGLOBIN A1C: Hgb A1c MFr Bld: 7.6 % — ABNORMAL HIGH (ref 4.6–6.5)

## 2021-03-19 ENCOUNTER — Telehealth: Payer: Self-pay

## 2021-03-19 NOTE — Chronic Care Management (AMB) (Signed)
  Care Management   Note  03/19/2021 Name: MIOSOTIS WETSEL MRN: [id number] DOB: 07-Dec-1945  ANALY BASSFORD is a 75 y.o. year old female who is a primary care patient of Derrel Nip, Aris Everts, MD and is actively engaged with the care management team. I reached out to Marcell Anger by phone today to assist with re-scheduling a follow up visit with the Pharmacist  Follow up plan: Telephone appointment with care management team member scheduled for:05/16/2021  Noreene Larsson, [place], [place], [place] [address] Direct Dial: [phone] [email or web address]

## 2021-03-19 NOTE — Chronic Care Management (AMB) (Signed)
  Care Management   Note  03/19/2021 Name: [name] MRN: [id number] DOB: 1945-09-26  [name] is a 75 y.o. year old female who is a primary care patient of Derrel Nip, Aris Everts, MD and is actively engaged with the care management team. I reached out to Marcell Anger by phone today to assist with re-scheduling a follow up visit with the Pharmacist  Follow up plan: Unsuccessful telephone outreach attempt made. A HIPAA compliant phone message was left for the patient providing contact information and requesting a return call.  The care management team will reach out to the patient again over the next 7 days.  If patient returns call to provider office, please advise to call [facility]  at [place], [place], [place], [place] [address] Direct Dial: [phone] [email or web address]

## 2021-03-20 ENCOUNTER — Ambulatory Visit: Payer: Medicare PPO | Admitting: Internal Medicine

## 2021-03-24 ENCOUNTER — Ambulatory Visit (INDEPENDENT_AMBULATORY_CARE_PROVIDER_SITE_OTHER): Payer: Medicare PPO | Admitting: Internal Medicine

## 2021-03-24 ENCOUNTER — Other Ambulatory Visit: Payer: Self-pay

## 2021-03-24 ENCOUNTER — Ambulatory Visit (INDEPENDENT_AMBULATORY_CARE_PROVIDER_SITE_OTHER): Payer: Medicare PPO

## 2021-03-24 ENCOUNTER — Encounter: Payer: Self-pay | Admitting: Internal Medicine

## 2021-03-24 VITALS — BP 110/70 | HR 65 | Temp 97.9°F | Resp 16 | Ht 65.0 in | Wt 151.5 lb

## 2021-03-24 DIAGNOSIS — E1169 Type 2 diabetes mellitus with other specified complication: Secondary | ICD-10-CM

## 2021-03-24 DIAGNOSIS — E785 Hyperlipidemia, unspecified: Secondary | ICD-10-CM | POA: Diagnosis not present

## 2021-03-24 DIAGNOSIS — K760 Fatty (change of) liver, not elsewhere classified: Secondary | ICD-10-CM | POA: Diagnosis not present

## 2021-03-24 DIAGNOSIS — I10 Essential (primary) hypertension: Secondary | ICD-10-CM

## 2021-03-24 DIAGNOSIS — R140 Abdominal distension (gaseous): Secondary | ICD-10-CM

## 2021-03-24 DIAGNOSIS — R194 Change in bowel habit: Secondary | ICD-10-CM | POA: Insufficient documentation

## 2021-03-24 NOTE — Assessment & Plan Note (Signed)
Well controlled on current regimen. Renal function stable, no changes today. 

## 2021-03-24 NOTE — Assessment & Plan Note (Signed)
Slight improvement in a1c with addition of Jardiance but less than expected since stopping glipizide.  wadvsied to check post prandial lunch sugars for the next week,  May add 2.5 mg glipizide.  contiue statin , LDL at goal   Lab Results  Component Value Date   HGBA1C 7.6 (H) 03/17/2021   Lab Results  Component Value Date   MICROALBUR <0.7 12/13/2020   MICROALBUR 1.2 02/22/2020     Lab Results  Component Value Date   LDLCALC 69 03/17/2021

## 2021-03-24 NOTE — Patient Instructions (Addendum)
   Try magnesium citrate  250 mg capsule once daily  instead of the miralax/metamucil  Add colace 200 mg (stool softener,  docusate) if needed.    Reassess bloating after one week    Try adding Beano at lunch and dinner    Check sugars 2 hours after lunch every day .  Do not check the other times unless you are feeling bad Send me the readings after one week  We may end up adding 2.5 mg glipizide at lunch

## 2021-03-24 NOTE — Progress Notes (Signed)
Subjective:  Patient ID: [name], female    DOB: April 24, 1946  Age: 75 y.o. MRN: [id number]  CC: The primary encounter diagnosis was Bloating symptom. Diagnoses of Hyperlipidemia associated with type 2 diabetes mellitus ([place]), Fatty liver disease, nonalcoholic, Primary hypertension, Change in bowel habits, and Functional bloating were also pertinent to this visit.  HPI Sydney CAPP presents for  Chief Complaint  Patient presents with   Follow-up    Diabetes Patient feels she has to constantly evacuate bowels even after large BM, sometimes it just comes out when urinating without urge.     T2DM:  She  feels generally well,  But is bothered by her change in bowel habits .  Weight has been stable since July.  . Checking  blood sugars  twice daily at variable times,  and fastings range from 114 to 140 .  Post prandial lunch 155 to 200  and past prandal dinner ranges from 97 3  to 160. No longer taking glipizide  taking metformin 500 mg bid and jardiance 25 mg daily .  Marland Kitchen Following a carbohydrate modified diet 6 days per week. Exercises daily after breakfast Denies numbness, burning and tingling of extremities. Appetite is good.    Fecal issues:  history of constipation previously managed with Ex lasx which was stopped 3 months ago and metamucil started.  Has 1-2 large BMS daily  but feels li she has had an incomplete evacuation and then has a soft  small caliber stool whenver she urinates. along with a bloated feeling .   Saw GI on Oct 5  BFL changed to miralax ,  8 week follow up advised and endoscopy to be done if symptoms do not improve.  Weight has been stable since July .     Feels bloated all the time  Diet reviewed,  drinks > 60 ounces of water daily.  Diet reviewed:  high fiber cereal most days.  1/2 sandwhich at lunch.  Avoids pasta and other starches.  No difference in the bloating with dietary changes .    Outpatient Medications Prior to Visit  Medication Sig Dispense Refill    ACCU-CHEK AVIVA PLUS test strip TEST TWICE A DAY 100 strip 2   atorvastatin (LIPITOR) 40 MG tablet Take 1 tablet (40 mg total) by mouth daily. 90 tablet 3   blood glucose meter kit and supplies KIT Dispense based on patient and insurance preference. Use up to four times daily as directed. (FOR ICD-9 250.00, 250.01). 1 each 0   calcium-vitamin D (OSCAL WITH D) 250-125 MG-UNIT per tablet Take 2 tablets by mouth daily.     COLLAGEN PO daily.     empagliflozin (JARDIANCE) 25 MG TABS tablet Take 1 tablet (25 mg total) by mouth daily before breakfast. 90 tablet 3   famotidine (PEPCID) 20 MG tablet TAKE 1 TABLET (20 MG TOTAL) BY MOUTH DAILY. 30 MINUTES BEFORE DINNER 90 tablet 1   folic acid (FOLVITE) 938 MCG tablet Take 2,400 mcg by mouth daily.     losartan (COZAAR) 50 MG tablet Take 1 tablet (50 mg total) by mouth daily. 90 tablet 3   metFORMIN (GLUCOPHAGE) 500 MG tablet Take 2 tablets (1,000 mg total) by mouth 2 (two) times daily with a meal. 360 tablet 1   methotrexate 50 MG/2ML injection 0.2 mL weekly  1   Multiple Vitamin (MULTIVITAMIN) tablet Take 1 tablet by mouth daily.     pantoprazole (PROTONIX) 40 MG tablet TAKE 1 TABLET BY MOUTH  EVERY DAY 90 tablet 1   Psyllium (METAMUCIL FIBER PO) Take by mouth.     No facility-administered medications prior to visit.    Review of Systems;  Patient denies headache, fevers, malaise, unintentional weight loss, skin rash, eye pain, sinus congestion and sinus pain, sore throat, dysphagia,  hemoptysis , cough, dyspnea, wheezing, chest pain, palpitations, orthopnea, edema, abdominal pain, nausea, melena, diarrhea, constipation, flank pain, dysuria, hematuria, urinary  Frequency, nocturia, numbness, tingling, seizures,  Focal weakness, Loss of consciousness,  Tremor, insomnia, depression, anxiety, and suicidal ideation.      Objective:  BP 110/70   Pulse 65   Temp 97.9 F (36.6 C) (Oral)   Resp 16   Ht 5' 5" (1.651 m)   Wt 151 lb 8 oz (68.7 kg)   SpO2  99%   BMI 25.21 kg/m   BP Readings from Last 3 Encounters:  03/24/21 110/70  02/19/21 127/82  01/14/21 121/76    Wt Readings from Last 3 Encounters:  03/24/21 151 lb 8 oz (68.7 kg)  02/19/21 153 lb (69.4 kg)  01/14/21 151 lb (68.5 kg)    General appearance: alert, cooperative and appears stated age Ears: normal TM's and external ear canals both ears Throat: lips, mucosa, and tongue normal; teeth and gums normal Neck: no adenopathy, no carotid bruit, supple, symmetrical, trachea midline and thyroid not enlarged, symmetric, no tenderness/mass/nodules Back: symmetric, no curvature. ROM normal. No CVA tenderness. Lungs: clear to auscultation bilaterally Heart: regular rate and rhythm, S1, S2 normal, no murmur, click, rub or gallop Abdomen: soft, non-tender; bowel sounds normal; no masses,  no organomegaly Pulses: 2+ and symmetric Skin: Skin color, texture, turgor normal. No rashes or lesions Lymph nodes: Cervical, supraclavicular, and axillary nodes normal.  Lab Results  Component Value Date   HGBA1C 7.6 (H) 03/17/2021   HGBA1C 7.8 (H) 12/13/2020   HGBA1C 7.8 (H) 08/28/2020    Lab Results  Component Value Date   CREATININE 0.81 03/17/2021   CREATININE 0.67 12/13/2020   CREATININE 0.75 08/28/2020    Lab Results  Component Value Date   WBC 3.8 (L) 03/17/2021   HGB 14.2 03/17/2021   HCT 43.8 03/17/2021   PLT 237.0 03/17/2021   GLUCOSE 175 (H) 03/17/2021   CHOL 140 03/17/2021   TRIG 58.0 03/17/2021   HDL 59.60 03/17/2021   LDLDIRECT 81 07/29/2017   LDLCALC 69 03/17/2021   ALT 13 03/17/2021   AST 24 03/17/2021   NA 138 03/17/2021   K 4.4 03/17/2021   CL 101 03/17/2021   CREATININE 0.81 03/17/2021   BUN 14 03/17/2021   CO2 29 03/17/2021   TSH 1.37 12/13/2020   HGBA1C 7.6 (H) 03/17/2021   MICROALBUR <0.7 12/13/2020    No results found.  Assessment & Plan:   Problem List Items Addressed This Visit     Hyperlipidemia associated with type 2 diabetes mellitus  ([place])    Slight improvement in a1c with addition of Jardiance but less than expected since stopping glipizide.  wadvsied to check post prandial lunch sugars for the next week,  May add 2.5 mg glipizide.  contiue statin , LDL at goal   Lab Results  Component Value Date   HGBA1C 7.6 (H) 03/17/2021   Lab Results  Component Value Date   MICROALBUR <0.7 12/13/2020   MICROALBUR 1.2 02/22/2020     Lab Results  Component Value Date   [place] 69 03/17/2021        Hypertension    Well controlled on current  regimen. Renal function stable, no changes today.      Fatty liver disease, nonalcoholic    Last imaging study normal and LFT's normal .  Attributed to normalization of weight   Lab Results  Component Value Date   ALT 13 03/17/2021   AST 24 03/17/2021   ALKPHOS 80 03/17/2021   BILITOT 1.0 03/17/2021         Change in bowel habits    Excessive bloating noted and having 2 or more BM's daily on miralax advised by GI Tahiliani 4 weeks ago.  One week  Trial of  Magnesium citrate 250 mg daily plus colace if needed . Keep appt with GI in 4 weeks       Functional bloating    Trial of beano, plain films ordered.  She has already  had a  CT abd and pelvis for this issue in Feb 2022       Other Visit Diagnoses     Bloating symptom    -  Primary   Relevant Orders   DG Abd 1 View       I am having Marcell Anger maintain her multivitamin, calcium-vitamin D, blood glucose meter kit and supplies, Psyllium (METAMUCIL FIBER PO), folic acid, methotrexate, famotidine, atorvastatin, losartan, empagliflozin, metFORMIN, COLLAGEN PO, Accu-Chek Aviva Plus, and pantoprazole.  No orders of the defined types were placed in this encounter.   There are no discontinued medications.  Follow-up: No follow-ups on file.   Crecencio Mc, MD

## 2021-03-24 NOTE — Assessment & Plan Note (Addendum)
Trial of beano, plain films ordered.  She has already  had a  CT abd and pelvis for this issue in Feb 2022

## 2021-03-24 NOTE — Assessment & Plan Note (Addendum)
Last imaging study normal and LFT's normal .  Attributed to normalization of weight   Lab Results  Component Value Date   ALT 13 03/17/2021   AST 24 03/17/2021   ALKPHOS 80 03/17/2021   BILITOT 1.0 03/17/2021

## 2021-03-24 NOTE — Assessment & Plan Note (Signed)
Excessive bloating noted and having 2 or more BM's daily on miralax advised by GI Tahiliani 4 weeks ago.  One week  Trial of  Magnesium citrate 250 mg daily plus colace if needed . Keep appt with GI in 4 weeks

## 2021-03-25 ENCOUNTER — Other Ambulatory Visit: Payer: Self-pay | Admitting: Internal Medicine

## 2021-03-25 DIAGNOSIS — E1169 Type 2 diabetes mellitus with other specified complication: Secondary | ICD-10-CM

## 2021-03-26 ENCOUNTER — Encounter: Payer: Self-pay | Admitting: Internal Medicine

## 2021-03-26 NOTE — Assessment & Plan Note (Signed)
Secondary to altered bowel habits. C T ab and pelvis FEB 2022 showed normal ovaries.  Follow up with GI

## 2021-04-08 ENCOUNTER — Telehealth: Payer: Medicare PPO

## 2021-04-17 DIAGNOSIS — M353 Polymyalgia rheumatica: Secondary | ICD-10-CM | POA: Diagnosis not present

## 2021-04-17 DIAGNOSIS — E119 Type 2 diabetes mellitus without complications: Secondary | ICD-10-CM | POA: Diagnosis not present

## 2021-04-17 DIAGNOSIS — M0609 Rheumatoid arthritis without rheumatoid factor, multiple sites: Secondary | ICD-10-CM | POA: Diagnosis not present

## 2021-04-23 ENCOUNTER — Ambulatory Visit (INDEPENDENT_AMBULATORY_CARE_PROVIDER_SITE_OTHER): Payer: Medicare PPO | Admitting: Gastroenterology

## 2021-04-23 ENCOUNTER — Encounter: Payer: Self-pay | Admitting: Gastroenterology

## 2021-04-23 VITALS — BP 136/95 | HR 70 | Temp 97.9°F | Wt 153.0 lb

## 2021-04-23 DIAGNOSIS — R194 Change in bowel habit: Secondary | ICD-10-CM

## 2021-04-23 DIAGNOSIS — R634 Abnormal weight loss: Secondary | ICD-10-CM | POA: Diagnosis not present

## 2021-04-23 MED ORDER — NA SULFATE-K SULFATE-MG SULF 17.5-3.13-1.6 GM/177ML PO SOLN: 1.0000 | Freq: Once | ORAL | 0 refills | Status: AC

## 2021-04-23 MED ORDER — LINACLOTIDE 72 MCG PO CAPS: 72.0000 ug | ORAL_CAPSULE | Freq: Every day | ORAL | 0 refills | Status: DC

## 2021-04-23 NOTE — Progress Notes (Signed)
Sydney Antigua, MD [address]  [place]  [place], [place] [address]  Main: [phone]  Fax: [phone]   Primary Care Physician: Sydney Mc, MD   Chief Complaint  Patient presents with   Follow-up    2 months   Constipation    Pt reports she has been doing better with constipation   Fecal incontinence    Pt reports that she now has stool come out even when she has already had a decent BM, this is while she is urinating...    HPI: [name] is a 75 y.o. female previously seen for constipation here for follow up. Pt is now reporting 2 Bms a day and states even if she has a large BM she feels she has not evacuated completely. Denies any BRBPR. Denies straining with Bms. Was previously advised to use miralax but is currently using Mag citrate and despite this reports incomplete evacuation, abdominal bloating and decreased appetite.  Weight has stabilized compared to before but continues to report appetite issues.  Previous history: Last colonoscopy was with Dr. Gustavo [name] in 2015 with 2 hyperplastic polyps removed and reported a good prep.  Prior to this she did have a colonoscopy in 2005.  Report not available but pathology shows benign colonic mucosa with lymphoid nodule and submucosal adipose tissue in the ascending colon.   ROS: All ROS reviewed and negative except as per HPI   Past Medical History:  Diagnosis Date   COVID-19 virus infection 12/10/2020   Positive test December 04 2020    Diabetes mellitus    Gastritis    High cholesterol    Hyperlipidemia    Lipitor trial was a failure   Hypertension    Syncope 01/15/2019    Past Surgical History:  Procedure Laterality Date   CARDIAC CATHETERIZATION  Aug[place] 2007   normal coronaries,  EF 60%   SPINE SURGERY  1995   SPINE SURGERY     bone graft    TUBAL LIGATION      Prior to Admission medications   Medication Sig Start Date End Date Taking? Authorizing Provider  ACCU-CHEK AVIVA PLUS test strip  TEST TWICE A DAY 02/18/21  Yes Sydney Mc, MD  atorvastatin (LIPITOR) 40 MG tablet Take 1 tablet (40 mg total) by mouth daily. 06/21/20  Yes Sydney Mc, MD  blood glucose meter kit and supplies KIT Dispense based on patient and insurance preference. Use up to four times daily as directed. (FOR ICD-9 250.00, 250.01). 08/29/14  Yes Sydney Mc, MD  calcium-vitamin D (OSCAL WITH D) 250-125 MG-UNIT per tablet Take 2 tablets by mouth daily.   Yes [provider]  COLLAGEN PO daily.   Yes [provider]  empagliflozin (JARDIANCE) 25 MG TABS tablet Take 1 tablet (25 mg total) by mouth daily before breakfast. 09/13/20  Yes Sydney Mc, MD  famotidine (PEPCID) 20 MG tablet TAKE 1 TABLET (20 MG TOTAL) BY MOUTH DAILY. [place] 08/01/18  Yes Sydney Mc, MD  folic acid (FOLVITE) 295 MCG tablet Take 2,400 mcg by mouth daily.   Yes [provider]  losartan (COZAAR) 50 MG tablet Take 1 tablet (50 mg total) by mouth daily. 06/21/20  Yes Sydney Mc, MD  metFORMIN (GLUCOPHAGE) 500 MG tablet TAKE 2 TABLETS (1,000 MG TOTAL) BY MOUTH 2 (TWO) TIMES DAILY WITH A MEAL. 03/25/21  Yes Sydney Mc, MD  methotrexate 50 MG/2ML injection 0.2 mL weekly 01/18/18  Yes [provider]  Multiple Vitamin (MULTIVITAMIN) tablet Take 1 tablet by mouth daily.   Yes [provider]  pantoprazole (PROTONIX) 40 MG tablet TAKE 1 TABLET BY MOUTH EVERY DAY 02/19/21  Yes Sydney Pheasant, MD  ferrous sulfate 325 (65 FE) MG tablet Take 325 mg by mouth daily with breakfast.  08/06/11  [provider]  omeprazole (PRILOSEC) 40 MG capsule Take 1 capsule (40 mg total) by mouth daily. 02/11/11 08/06/11  Sydney Mc, MD    Family History  Problem Relation Age of Onset   Diabetes Mother    Heart disease Father    Stroke Father    Diabetes Father    Diabetes Maternal Grandfather    Diabetes Brother    Diabetes Brother    Breast cancer Neg Hx      Social  History   Tobacco Use   Smoking status: Never   Smokeless tobacco: Never  Vaping Use   Vaping Use: Never used  Substance Use Topics   Alcohol use: No   Drug use: No    Allergies as of 04/23/2021 - Review Complete 04/23/2021  Allergen Reaction Noted   Amoxicillin Hives 02/11/2011   Latex Rash 01/06/2013    Physical Examination:  Constitutional: General:   Alert,  Well-developed, well-nourished, pleasant and cooperative in NAD BP (!) 136/95   Pulse 70   Temp 97.9 F (36.6 C) (Oral)   Wt 153 lb (69.4 kg)   BMI 25.46 kg/m   Respiratory: Normal respiratory effort  Gastrointestinal:  Soft, non-tender and non-distended without masses, hepatosplenomegaly or hernias noted.  No guarding or rebound tenderness.     Cardiac: No clubbing or edema.  No cyanosis. Normal posterior tibial pedal pulses noted.  Psych:  Alert and cooperative. Normal mood and affect.  Musculoskeletal:  Normal gait. Head normocephalic, atraumatic. Symmetrical without gross deformities. 5/5 Lower extremity strength bilaterally.  Skin: Warm. Intact without significant lesions or rashes. No jaundice.  Neck: Supple, trachea midline  Lymph: No cervical lymphadenopathy  Psych:  Alert and oriented x3, Alert and cooperative. Normal mood and affect.  Labs: CMP     Component Value Date/Time   NA 138 03/17/2021 0809   K 4.4 03/17/2021 0809   CL 101 03/17/2021 0809   CO2 29 03/17/2021 0809   GLUCOSE 175 (H) 03/17/2021 0809   BUN 14 03/17/2021 0809   CREATININE 0.81 03/17/2021 0809   CREATININE 0.74 07/29/2017 0921   CALCIUM 9.4 03/17/2021 0809   PROT 7.0 03/17/2021 0809   ALBUMIN 4.1 03/17/2021 0809   AST 24 03/17/2021 0809   ALT 13 03/17/2021 0809   ALKPHOS 80 03/17/2021 0809   BILITOT 1.0 03/17/2021 0809   GFRNONAA >60 01/05/2019 1007   GFRAA >60 01/05/2019 1007   Lab Results  Component Value Date   WBC 3.8 (L) 03/17/2021   HGB 14.2 03/17/2021   HCT 43.8 03/17/2021   MCV 92.0 03/17/2021    PLT 237.0 03/17/2021    Imaging Studies: IMPRESSION: Bowel gas pattern is nonspecific. Moderate amount of stool is seen in colon without signs of fecal impaction in the rectosigmoid.     Electronically Signed   By: [name] M.D.   On: 03/25/2021 14:07  Assessment and Plan:   [name] is a 75 y.o. y/o female here for follow-up and reports that although her constipation has resolved, and she is having 2 large bowel movements a day, that she describes as loose, she continues to have abdominal bloating, and  decreased appetite  We discussed that her symptoms may be due to incomplete evacuation given that moderate stool burden was seen on x-ray and she feels that she does not evacuate completely, and we discussed starting pharmacologic therapy for constipation and reassessing symptoms. Pt is agreeable to this, but due to alteration in bowel habits over the last month, now with some fecal incontinence and loose stools would also prefer to proceed with colonoscopy at this time.   Option of stepwise approach to first see if symptoms improve with treatment of constipation discussed before proceeding with colonoscopy, but pt prefers to proceed with colonoscopy at this time.   Colonoscopy would allow for biopsies of microscopic colitis. Given that she previously had significant weight loss reported on last visit (albeit stablized at this time) and has been having discomfort due to abdominal bloating colonoscopy would also help rule out any other underlying lesions. Patient's last colonoscopy was in 2015 by Dr. Gustavo [name]  Low dose Linzess prescribed Stop other laxatives  I have discussed alternative options, risks & benefits,  which include, but are not limited to, bleeding, infection, perforation,respiratory complication & drug reaction.  The patient agrees with this plan & written consent will be obtained.     Dr [name]

## 2021-04-30 ENCOUNTER — Ambulatory Visit
Admission: RE | Admit: 2021-04-30 | Discharge: 2021-04-30 | Disposition: A | Discharge status: Home / Self Care | Payer: Medicare PPO | Attending: Gastroenterology | Admitting: Gastroenterology

## 2021-04-30 ENCOUNTER — Encounter
Admission: RE | Disposition: A | Discharge status: Home / Self Care | Payer: Self-pay | Source: Home / Self Care | Attending: Gastroenterology

## 2021-04-30 ENCOUNTER — Ambulatory Visit: Payer: Medicare PPO | Admitting: Certified Registered"

## 2021-04-30 ENCOUNTER — Encounter: Payer: Self-pay | Admitting: Gastroenterology

## 2021-04-30 DIAGNOSIS — Z8616 Personal history of COVID-19: Secondary | ICD-10-CM | POA: Insufficient documentation

## 2021-04-30 DIAGNOSIS — Z1211 Encounter for screening for malignant neoplasm of colon: Secondary | ICD-10-CM | POA: Diagnosis not present

## 2021-04-30 DIAGNOSIS — R634 Abnormal weight loss: Secondary | ICD-10-CM

## 2021-04-30 DIAGNOSIS — E119 Type 2 diabetes mellitus without complications: Secondary | ICD-10-CM | POA: Diagnosis not present

## 2021-04-30 DIAGNOSIS — K648 Other hemorrhoids: Secondary | ICD-10-CM | POA: Insufficient documentation

## 2021-04-30 DIAGNOSIS — K635 Polyp of colon: Secondary | ICD-10-CM | POA: Diagnosis not present

## 2021-04-30 DIAGNOSIS — Z6824 Body mass index (BMI) 24.0-24.9, adult: Secondary | ICD-10-CM | POA: Diagnosis not present

## 2021-04-30 DIAGNOSIS — R194 Change in bowel habit: Secondary | ICD-10-CM | POA: Diagnosis not present

## 2021-04-30 DIAGNOSIS — K5730 Diverticulosis of large intestine without perforation or abscess without bleeding: Secondary | ICD-10-CM | POA: Diagnosis not present

## 2021-04-30 DIAGNOSIS — I10 Essential (primary) hypertension: Secondary | ICD-10-CM | POA: Insufficient documentation

## 2021-04-30 DIAGNOSIS — E7800 Pure hypercholesterolemia, unspecified: Secondary | ICD-10-CM | POA: Diagnosis not present

## 2021-04-30 HISTORY — PX: COLONOSCOPY WITH PROPOFOL: SHX5780

## 2021-04-30 HISTORY — DX: Polymyalgia rheumatica: M35.3

## 2021-04-30 LAB — GLUCOSE, CAPILLARY: Glucose-Capillary: 141 mg/dL — ABNORMAL HIGH (ref 70–99)

## 2021-04-30 SURGERY — COLONOSCOPY WITH PROPOFOL
Anesthesia: General

## 2021-04-30 MED ORDER — SODIUM CHLORIDE 0.9 % IV SOLN: INTRAVENOUS | Status: DC

## 2021-04-30 MED ORDER — PROPOFOL 10 MG/ML IV BOLUS
INTRAVENOUS | Status: DC | PRN
  Administered 2021-04-30: 70 mg via INTRAVENOUS

## 2021-04-30 MED ORDER — PROPOFOL 500 MG/50ML IV EMUL
INTRAVENOUS | Status: DC | PRN
Start: 2021-04-30 — End: 2021-04-30
  Administered 2021-04-30: 120 ug/kg/min via INTRAVENOUS

## 2021-04-30 NOTE — Anesthesia Postprocedure Evaluation (Signed)
Anesthesia Post Note  Patient: [name]  Procedure(s) Performed: COLONOSCOPY WITH PROPOFOL  Patient location during evaluation: Endoscopy Anesthesia Type: General Level of consciousness: awake and alert Pain management: pain level controlled Vital Signs Assessment: post-procedure vital signs reviewed and stable Respiratory status: spontaneous breathing, nonlabored ventilation, respiratory function stable and patient connected to nasal cannula oxygen Cardiovascular status: blood pressure returned to baseline and stable Postop Assessment: no apparent nausea or vomiting Anesthetic complications: no   No notable events documented.   Last Vitals:  Vitals:   04/30/21 0941 04/30/21 1046  BP: (!) 152/94 103/67  Pulse: 76   Resp: 18   Temp: (!) 36.1 C (!) 35.6 C  SpO2: 100%     Last Pain:  Vitals:   04/30/21 1046  TempSrc: Temporal  PainSc: 0-No pain                 Arita Miss

## 2021-04-30 NOTE — Transfer of Care (Signed)
Immediate Anesthesia Transfer of Care Note  Patient: [name]  Procedure(s) Performed: COLONOSCOPY WITH PROPOFOL  Patient Location: PACU and Endoscopy Unit  Anesthesia Type:General  Level of Consciousness: drowsy  Airway & Oxygen Therapy: Patient Spontanous Breathing  Post-op Assessment: Report given to RN  Post vital signs: stable  Last Vitals:  Vitals Value Taken Time  BP    Temp    Pulse    Resp    SpO2      Last Pain:  Vitals:   04/30/21 0941  TempSrc: Temporal  PainSc: 0-No pain         Complications: No notable events documented.

## 2021-04-30 NOTE — Op Note (Signed)
[facility] Gastroenterology Patient Name: [name] Procedure Date: 04/30/2021 10:02 AM MRN: [id number] Account #: [id number] Date of Birth: December 21, 1945 Admit Type: Outpatient Age: 75 Room: [facility] ENDO ROOM 3 Gender: Female Note Status: Finalized Instrument Name: Park Meo [id number] Procedure:             Colonoscopy Indications:           Change in bowel habits, Weight loss Providers:             Talon Regala B. Bonna Gains MD, MD Referring MD:          Dionne Bucy. Bacigalupo (Referring MD) Medicines:             Monitored Anesthesia Care Complications:         No immediate complications. Procedure:             Pre-Anesthesia Assessment:                        - ASA Grade Assessment: II - A patient with mild                         systemic disease.                        - Prior to the procedure, a History and Physical was                         performed, and patient medications, allergies and                         sensitivities were reviewed. The patient's tolerance                         of previous anesthesia was reviewed.                        - The risks and benefits of the procedure and the                         sedation options and risks were discussed with the                         patient. All questions were answered and informed                         consent was obtained.                        - Patient identification and proposed procedure were                         verified prior to the procedure by the physician, the                         nurse, the anesthesiologist, the anesthetist and the                         technician. The procedure was verified in the  procedure room.                        After obtaining informed consent, the colonoscope was                         passed under direct vision. Throughout the procedure,                         the patient's blood pressure, pulse, and oxygen                          saturations were monitored continuously. The                         Colonoscope was introduced through the anus and                         advanced to the the cecum, identified by appendiceal                         orifice and ileocecal valve. The colonoscopy was                         performed with ease. The patient tolerated the                         procedure well. The quality of the bowel preparation                         was good. Findings:      The perianal and digital rectal examinations were normal.      A 4 mm polyp was found in the cecum. The polyp was sessile. The polyp       was removed with a jumbo cold forceps. Resection and retrieval were       complete.      A 5 mm polyp was found in the descending colon. The polyp was flat. The       polyp was removed with a jumbo cold forceps. Resection and retrieval       were complete.      The colon (entire examined portion) was moderately tortuous.      Multiple diverticula were found in the sigmoid colon.      The exam was otherwise without abnormality.      Non-bleeding internal hemorrhoids were found during retroflexion.      Terminal ileum could not be intubated due to tortuous colon Impression:            - One 4 mm polyp in the cecum, removed with a jumbo                         cold forceps. Resected and retrieved.                        - One 5 mm polyp in the descending colon, removed with                         a jumbo cold forceps. Resected and retrieved.                        -  Tortuous colon.                        - Diverticulosis in the sigmoid colon.                        - The examination was otherwise normal.                        - Non-bleeding internal hemorrhoids. Recommendation:        - Discharge patient to home (with escort).                        - Advance diet as tolerated.                        - Continue present medications.                        - Await pathology results.                         - Repeat colonoscopy date to be determined after                         pending pathology results are reviewed.                        - The findings and recommendations were discussed with                         the patient.                        - The findings and recommendations were discussed with                         the patient's family.                        - Return to primary care physician as previously                         scheduled.                        - High fiber diet. Procedure Code(s):     --- Professional ---                        [phone], Colonoscopy, flexible; with biopsy, single or                         multiple Diagnosis Code(s):     --- Professional ---                        K63.5, Polyp of colon                        R19.4, Change in bowel habit                        R63.4, Abnormal weight loss CPT copyright  2019 American Medical Association. All rights reserved. The codes documented in this report are preliminary and upon coder review may  be revised to meet current compliance requirements.  [name], MD Margretta Sidle B. Bonna Gains MD, MD 04/30/2021 10:50:10 AM This report has been signed electronically. Number of Addenda: 0 Note Initiated On: 04/30/2021 10:02 AM Scope Withdrawal Time: 0 hours 20 minutes 54 seconds  Total Procedure Duration: 0 hours 30 minutes 45 seconds  Estimated Blood Loss:  Estimated blood loss: none.      [facility]

## 2021-04-30 NOTE — H&P (Signed)
Sydney Antigua, MD [address] [place], [place], [place], [address] [address], [place], [place], [place], [address] Phone: [phone]  Fax: [phone]  Primary Care Physician:  Crecencio Mc, MD   Pre-Procedure History & Physical: HPI:  [name] is a 75 y.o. female is here for a colonoscopy.   Past Medical History:  Diagnosis Date   COVID-19 virus infection 12/10/2020   Positive test December 04 2020    Diabetes mellitus    Gastritis    High cholesterol    Hyperlipidemia    Lipitor trial was a failure   Hypertension    PMR (polymyalgia rheumatica) ([place])    Syncope 01/15/2019    Past Surgical History:  Procedure Laterality Date   [place]  12/16/2005   normal coronaries,  EF 60%   SPINE SURGERY  05/18/1993   SPINE SURGERY     bone graft    TUBAL LIGATION      Prior to Admission medications   Medication Sig Start Date End Date Taking? Authorizing Provider  atorvastatin (LIPITOR) 40 MG tablet Take 1 tablet (40 mg total) by mouth daily. 06/21/20  Yes Crecencio Mc, MD  empagliflozin (JARDIANCE) 25 MG TABS tablet Take 1 tablet (25 mg total) by mouth daily before breakfast. 09/13/20  Yes Crecencio Mc, MD  metFORMIN (GLUCOPHAGE) 500 MG tablet TAKE 2 TABLETS (1,000 MG TOTAL) BY MOUTH 2 (TWO) TIMES DAILY WITH A MEAL. 03/25/21  Yes Crecencio Mc, MD  methotrexate 50 MG/2ML injection 0.2 mL weekly 01/18/18  Yes [provider]  [place] test strip TEST TWICE A DAY 02/18/21   Crecencio Mc, MD  blood glucose meter kit and supplies KIT Dispense based on patient and insurance preference. Use up to four times daily as directed. (FOR ICD-9 250.00, 250.01). 08/29/14   Crecencio Mc, MD  calcium-vitamin D (OSCAL WITH D) 250-125 MG-UNIT per tablet Take 2 tablets by mouth daily.    [provider]  COLLAGEN PO daily.    [provider]  famotidine (PEPCID) 20 MG tablet TAKE 1 TABLET (20 MG  TOTAL) BY MOUTH DAILY. [place] 08/01/18   Crecencio Mc, MD  folic acid (FOLVITE) 578 MCG tablet Take 2,400 mcg by mouth daily.    [provider]  linaclotide (LINZESS) 72 MCG capsule Take 1 capsule (72 mcg total) by mouth daily. 04/23/21 07/22/21  Virgel Manifold, MD  losartan (COZAAR) 50 MG tablet Take 1 tablet (50 mg total) by mouth daily. 06/21/20   Crecencio Mc, MD  Multiple Vitamin (MULTIVITAMIN) tablet Take 1 tablet by mouth daily.    [provider]  pantoprazole (PROTONIX) 40 MG tablet TAKE 1 TABLET BY MOUTH EVERY DAY 02/19/21   Einar Pheasant, MD  ferrous sulfate 325 (65 FE) MG tablet Take 325 mg by mouth daily with breakfast.  08/06/11  [provider]  omeprazole (PRILOSEC) 40 MG capsule Take 1 capsule (40 mg total) by mouth daily. 02/11/11 08/06/11  Crecencio Mc, MD    Allergies as of 04/23/2021 - Review Complete 04/23/2021  Allergen Reaction Noted   Amoxicillin Hives 02/11/2011   Latex Rash 01/06/2013    Family History  Problem Relation Age of Onset   Diabetes Mother    Heart disease Father    Stroke Father    Diabetes Father    Diabetes Maternal Grandfather    Diabetes Brother    Diabetes  Brother    Breast cancer Neg Hx     Social History   Socioeconomic History   Marital status: Married    Spouse name: Not on file   Number of children: Not on file   Years of education: Not on file   Highest education level: Not on file  Occupational History   Occupation: Programmer, multimedia: [employer]    Comment: Retired 2014  Tobacco Use   Smoking status: Never   Smokeless tobacco: Never  Vaping Use   Vaping Use: Never used  Substance and Sexual Activity   Alcohol use: No   Drug use: No   Sexual activity: Yes    Partners: Male    Birth control/protection: None  Other Topics Concern   Not on file  Social History Narrative   Not on file   Social Determinants of Health   Financial Resource Strain: Low Risk     Difficulty of Paying Living Expenses: Not hard at all  Food Insecurity: No Food Insecurity   Worried About Charity fundraiser in the Last Year: Never true   [place] in the Last Year: Never true  Transportation Needs: No Transportation Needs   Lack of Transportation (Medical): No   Lack of Transportation (Non-Medical): No  Physical Activity: Not on file  Stress: No Stress Concern Present   Feeling of Stress : Not at all  Social Connections: Unknown   Frequency of Communication with Friends and Family: Not on file   Frequency of Social Gatherings with Friends and Family: Not on file   Attends Religious Services: Not on file   Active Member of Clubs or Organizations: Not on file   Attends Archivist Meetings: Not on file   Marital Status: Married  Human resources officer Violence: Not At Risk   Fear of Current or Ex-Partner: No   Emotionally Abused: No   Physically Abused: No   Sexually Abused: No    Review of Systems: See HPI, otherwise negative ROS  Physical Exam: Constitutional: General:   Alert,  Well-developed, well-nourished, pleasant and cooperative in NAD BP (!) 152/94    Pulse 76    Temp (!) 96.9 F (36.1 C) (Temporal)    Resp 18    Ht 5' 5"  (1.651 m)    Wt 66.2 kg    SpO2 100%    BMI 24.30 kg/m   Head: Normocephalic, atraumatic.   Eyes:  Sclera clear, no icterus.   Conjunctiva pink.   Mouth:  No deformity or lesions, oropharynx pink & moist.  Neck:  Supple, trachea midline  Respiratory: Normal respiratory effort  Gastrointestinal:  Soft, non-tender and non-distended without masses, hepatosplenomegaly or hernias noted.  No guarding or rebound tenderness.     Cardiac: No clubbing or edema.  No cyanosis. Normal posterior tibial pedal pulses noted.  Lymphatic:  No significant cervical adenopathy.  Psych:  Alert and cooperative. Normal mood and affect.  Musculoskeletal:   Symmetrical without gross deformities. 5/5 Lower extremity strength  bilaterally.  Skin: Warm. Intact without significant lesions or rashes. No jaundice.  Neurologic:  Face symmetrical, tongue midline, Normal sensation to touch;  grossly normal neurologically.  Psych:  Alert and oriented x3, Alert and cooperative. Normal mood and affect.  Impression/Plan: [name] is here for a colonoscopy to be performed for altered bowel habits, weight loss.  Risks, benefits, limitations, and alternatives regarding  colonoscopy have been reviewed with the patient.  Questions have been answered.  All parties agreeable.   Virgel Manifold, MD  04/30/2021, 10:07 AM

## 2021-04-30 NOTE — Anesthesia Preprocedure Evaluation (Signed)
Anesthesia Evaluation  Patient identified by MRN, date of birth, ID band Patient awake    Reviewed: Allergy & Precautions, NPO status , Patient's Chart, lab work & pertinent test results  History of Anesthesia Complications Negative for: history of anesthetic complications  Airway Mallampati: II  TM Distance: >3 FB Neck ROM: Full    Dental no notable dental hx. (+) Teeth Intact   Pulmonary neg pulmonary ROS, neg sleep apnea, neg COPD, Patient abstained from smoking.Not current smoker,    Pulmonary exam normal breath sounds clear to auscultation       Cardiovascular Exercise Tolerance: Good METShypertension, (-) CAD and (-) Past MI negative cardio ROS  (-) dysrhythmias  Rhythm:Regular Rate:Normal - Systolic murmurs TTE 9678 INTERPRETATION  NORMAL LEFT VENTRICULAR SYSTOLIC FUNCTION  NORMAL RIGHT VENTRICULAR SYSTOLIC FUNCTION  MILD VALVULAR REGURGITATION (See above)  NO VALVULAR STENOSIS  MILD TR, PR  TRIVIAL MR  TRIVIAL PFO  EF 55-60%     Neuro/Psych negative neurological ROS  negative psych ROS   GI/Hepatic neg GERD  ,(+)     (-) substance abuse  ,   Endo/Other  diabetes, Well Controlled  Renal/GU negative Renal ROS     Musculoskeletal   Abdominal   Peds  Hematology   Anesthesia Other Findings Past Medical History: 12/10/2020: COVID-19 virus infection     Comment:  Positive test December 04 2020  No date: Diabetes mellitus No date: Gastritis No date: High cholesterol No date: Hyperlipidemia     Comment:  Lipitor trial was a failure No date: Hypertension No date: PMR (polymyalgia rheumatica) ([facility]) 01/15/2019: Syncope  Reproductive/Obstetrics                             Anesthesia Physical Anesthesia Plan  ASA: 2  Anesthesia Plan: General   Post-op Pain Management: Minimal or no pain anticipated   Induction: Intravenous  PONV Risk Score and Plan: 3 and Propofol  infusion, TIVA and Ondansetron  Airway Management Planned: Nasal Cannula  Additional Equipment: None  Intra-op Plan:   Post-operative Plan:   Informed Consent: I have reviewed the patients History and Physical, chart, labs and discussed the procedure including the risks, benefits and alternatives for the proposed anesthesia with the patient or authorized representative who has indicated his/her understanding and acceptance.     Dental advisory given  Plan Discussed with: CRNA and Surgeon  Anesthesia Plan Comments: (Discussed risks of anesthesia with patient, including possibility of difficulty with spontaneous ventilation under anesthesia necessitating airway intervention, PONV, and rare risks such as cardiac or respiratory or neurological events, and allergic reactions. Discussed the role of CRNA in patient's perioperative care. Patient understands.)        Anesthesia Quick Evaluation

## 2021-05-01 ENCOUNTER — Encounter: Payer: Self-pay | Admitting: Gastroenterology

## 2021-05-01 LAB — SURGICAL PATHOLOGY

## 2021-05-02 ENCOUNTER — Ambulatory Visit (INDEPENDENT_AMBULATORY_CARE_PROVIDER_SITE_OTHER): Payer: Medicare PPO

## 2021-05-02 VITALS — Ht 65.0 in | Wt 146.0 lb

## 2021-05-02 DIAGNOSIS — Z0000 Encounter for general adult medical examination without abnormal findings: Secondary | ICD-10-CM

## 2021-05-02 NOTE — Patient Instructions (Addendum)
°  [name] , Thank you for taking time to come for your Medicare Wellness Visit. I appreciate your ongoing commitment to your health goals. Please review the following plan we discussed and let me know if I can assist you in the future.   These are the goals we discussed:  Goals       Patient Stated     Medication Monitoring (pt-stated)      Patient Goals/Self-Care Activities Over the next 90 days, patient will:  - take medications as prescribed check glucose twice daily, document, and provide at future appointments      Other     Maintain Healthy Lifestyle      Stay active Healthy diet      Peak Blood Glucose < 180        This is a list of the screening recommended for you and due dates:  Health Maintenance  Topic Date Due   Mammogram  08/03/2020   Complete foot exam   05/28/2021*   Eye exam for diabetics  08/06/2021   Hemoglobin A1C  09/14/2021   Tetanus Vaccine  08/09/2023   Colon Cancer Screening  05/01/2031   Pneumonia Vaccine  Completed   Flu Shot  Completed   DEXA scan (bone density measurement)  Completed   COVID-19 Vaccine  Completed   Hepatitis C Screening: USPSTF Recommendation to screen - Ages 43-79 yo.  Completed   Zoster (Shingles) Vaccine  Completed   HPV Vaccine  Aged Out  *Topic was postponed. The date shown is not the original due date.

## 2021-05-02 NOTE — Progress Notes (Addendum)
Subjective:   [name] is a 75 y.o. female who presents for Medicare Annual (Subsequent) preventive examination.  Review of Systems    No ROS.  Medicare Wellness Virtual Visit.  Visual/audio telehealth visit, UTA vital signs.   See social history for additional risk factors.   Cardiac Risk Factors include: advanced age (>11mn, >>41women)     Objective:    Today's Vitals   05/02/21 1119  Weight: 146 lb (66.2 kg)  Height: _0  (1.651 m)   Body mass index is 24.3 kg/m.  Advanced Directives 05/02/2021 04/30/2021 05/01/2020 05/01/2019 04/28/2018 04/13/2017 01/01/2016  Does Patient Have a Medical Advance Directive? Yes Yes Yes No No No No  Type of AParamed[place]of [place] will [place] will [place] will - - - -  Does patient want to make changes to medical advance directive? No - Patient declined - No - Patient declined - - - -  Copy of HHopkinsin Chart? Yes - validated most recent copy scanned in chart (See row information) - No - copy requested - - - -  Would patient like information on creating a medical advance directive? - - - No - Guardian declined No - Patient declined No - Patient declined No - patient declined information    Current Medications (verified) Outpatient Encounter Medications as of 05/02/2021  Medication Sig   ACCU-CHEK AVIVA PLUS test strip TEST TWICE A DAY   atorvastatin (LIPITOR) 40 MG tablet Take 1 tablet (40 mg total) by mouth daily.   blood glucose meter kit and supplies KIT Dispense based on patient and insurance preference. Use up to four times daily as directed. (FOR ICD-9 250.00, 250.01).   calcium-vitamin D (OSCAL WITH D) 250-125 MG-UNIT per tablet Take 2 tablets by mouth daily.   COLLAGEN PO daily.   empagliflozin (JARDIANCE) 25 MG TABS tablet Take 1 tablet (25 mg total) by mouth daily before breakfast.   famotidine (PEPCID) 20 MG tablet  TAKE 1 TABLET (20 MG TOTAL) BY MOUTH DAILY. 30 MINUTES BEFORE DINNER   folic acid (FOLVITE) 8498MCG tablet Take 2,400 mcg by mouth daily.   linaclotide (LINZESS) 72 MCG capsule Take 1 capsule (72 mcg total) by mouth daily.   losartan (COZAAR) 50 MG tablet Take 1 tablet (50 mg total) by mouth daily.   metFORMIN (GLUCOPHAGE) 500 MG tablet TAKE 2 TABLETS (1,000 MG TOTAL) BY MOUTH 2 (TWO) TIMES DAILY WITH A MEAL.   methotrexate 50 MG/2ML injection 0.2 mL weekly   Multiple Vitamin (MULTIVITAMIN) tablet Take 1 tablet by mouth daily.   pantoprazole (PROTONIX) 40 MG tablet TAKE 1 TABLET BY MOUTH EVERY DAY   [DISCONTINUED] ferrous sulfate 325 (65 FE) MG tablet Take 325 mg by mouth daily with breakfast.   [DISCONTINUED] omeprazole (PRILOSEC) 40 MG capsule Take 1 capsule (40 mg total) by mouth daily.   No facility-administered encounter medications on file as of 05/02/2021.    Allergies (verified) Amoxicillin and Latex   History: Past Medical History:  Diagnosis Date   COVID-19 virus infection 12/10/2020   Positive test December 04 2020    Diabetes mellitus    Gastritis    High cholesterol    Hyperlipidemia    Lipitor trial was a failure   Hypertension    PMR (polymyalgia rheumatica) ([place]    Syncope 01/15/2019   Past Surgical History:  Procedure Laterality Date   [place]  12/16/2005   normal coronaries,  EF 60%   COLONOSCOPY WITH PROPOFOL N/A 04/30/2021   Procedure: COLONOSCOPY WITH PROPOFOL;  Surgeon: Virgel Manifold, MD;  Location: [facility] ENDOSCOPY;  Service: Endoscopy;  Laterality: N/A;  DM   SPINE SURGERY  05/18/1993   SPINE SURGERY     bone graft    TUBAL LIGATION     Family History  Problem Relation Age of Onset   Diabetes Mother    Heart disease Father    Stroke Father    Diabetes Father    Diabetes Maternal Grandfather    Diabetes Brother    Diabetes Brother    Breast cancer Neg Hx    Social History   Socioeconomic History    Marital status: Married    Spouse name: Not on file   Number of children: Not on file   Years of education: Not on file   Highest education level: Not on file  Occupational History   Occupation: Programmer, multimedia: [employer]    Comment: Retired 2014  Tobacco Use   Smoking status: Never   Smokeless tobacco: Never  Vaping Use   Vaping Use: Never used  Substance and Sexual Activity   Alcohol use: No   Drug use: No   Sexual activity: Yes    Partners: Male    Birth control/protection: None  Other Topics Concern   Not on file  Social History Narrative   Not on file   Social Determinants of Health   Financial Resource Strain: Low Risk    Difficulty of Paying Living Expenses: Not hard at all  Food Insecurity: No Food Insecurity   Worried About Charity fundraiser in the Last Year: Never true   [place] in the Last Year: Never true  Transportation Needs: No Transportation Needs   Lack of Transportation (Medical): No   Lack of Transportation (Non-Medical): No  Physical Activity: Sufficiently Active   Days of Exercise per Week: 3 days   Minutes of Exercise per Session: 60 min  Stress: No Stress Concern Present   Feeling of Stress : Not at all  Social Connections: Unknown   Frequency of Communication with Friends and Family: Not on file   Frequency of Social Gatherings with Friends and Family: Not on file   Attends Religious Services: Not on file   Active Member of Clubs or Organizations: Not on file   Attends Archivist Meetings: Not on file   Marital Status: Married    Tobacco Counseling Counseling given: Not Answered   Clinical Intake:  Pre-visit preparation completed: Yes    Nutrition Risk Assessment: Has the patient had any N/V/D within the last 2 months?  No  Does the patient have any non-healing wounds?  No   Diabetes: How often do you monitor your CBG's? She tries to monitor twice daily.   Diabetes Management: Is the patient seen by Chronic  Care Management for management of their diabetes?  Yes  Would the patient like to be referred to a Nutritionist or for Diabetic Management?  No      Diabetes: Yes (Followed by PCP)  How often do you need to have someone help you when you read instructions, pamphlets, or other written materials from your doctor or pharmacy?: 1 - Never   Interpreter Needed?: No     Activities of Daily Living In your present state of health, do you have any difficulty performing the following activities: 05/02/2021  Hearing? N  Vision? N  Difficulty concentrating or making decisions? N  Walking or climbing stairs? N  Dressing or bathing? N  Doing errands, shopping? N  Preparing Food and eating ? N  Comment Pace self  Using the Toilet? N  In the past six months, have you accidently leaked urine? N  Do you have problems with loss of bowel control? N  Managing your Medications? N  Managing your Finances? N  Housekeeping or managing your Housekeeping? N  Some recent data might be hidden    Patient Care Team: Crecencio Mc, MD as PCP - General (Internal Medicine) Emmaline Kluver., MD (Rheumatology) Osborne Oman, MD as Consulting Physician (Obstetrics and Gynecology) De Hollingshead, RPH-CPP (Pharmacist)  Indicate any recent Medical Services you may have received from other than [facility] providers in the past year (date may be approximate).     Assessment:   This is a routine wellness examination for [place].  Virtual Visit via Telephone Note  I connected with  [name] on 05/02/21 at 11:15 AM EST by telephone and verified that I am speaking with the correct person using two identifiers.  Persons participating in the virtual visit: patient/Nurse Health Advisor   I discussed the limitations, risks, security and privacy concerns of performing an evaluation and management service by telephone and the availability of in person appointments. The patient expressed understanding and  agreed to proceed.  Interactive audio and video telecommunications were attempted between this nurse and patient, however failed, due to patient having technical difficulties OR patient did not have access to video capability.  We continued and completed visit with audio only.  Some vital signs may be absent or patient reported.   Hearing/Vision screen Hearing Screening - Comments:: Patient is able to hear conversational tones without difficulty. No issues reported.  Vision Screening - Comments:: Followed [facility]  No retinopathy  They have regular follow up with the ophthalmologist  Dietary issues and exercise activities discussed: Current Exercise Habits: Home exercise routine, Type of exercise: yoga;walking;stretching, Frequency (Times/Week): 5, Intensity: Mild Low carb diet   Goals Addressed             This Visit's Progress    Maintain Healthy Lifestyle       Stay active Healthy diet       Depression Screen PHQ 2/9 Scores 05/02/2021 03/24/2021 12/10/2020 08/26/2020 05/31/2020 05/01/2020 05/01/2019  PHQ - 2 Score 0 0 1 0 0 0 0  PHQ- 9 Score - - - - - - -    Fall Risk Fall Risk  05/02/2021 03/24/2021 12/10/2020 08/26/2020 05/31/2020  Falls in the past year? 0 0 0 0 0  Number falls in past yr: 0 0 - - 0  Injury with Fall? - 0 - - 0  Risk for fall due to : - No Fall Risks - - -  Follow up _0     FALL RISK PREVENTION PERTAINING TO THE HOME: Home free of loose throw rugs in walkways, pet beds, electrical cords, etc? Yes  Adequate lighting in your home to reduce risk of falls? Yes   ASSISTIVE DEVICES UTILIZED TO PREVENT FALLS: Life alert? No  Use of a cane, walker or w/c? No  Grab bars in the bathroom? No  Shower chair or bench in shower? No   TIMED UP AND GO: Was the test performed? No .   Cognitive Function: MMSE -  Mini  Mental State Exam 04/13/2017 01/01/2016 09/12/2014  Orientation to time _0 Orientation to Place _1 Registration _2 Attention/ Calculation _3 Recall _4 Language- name 2 objects _5 Language- repeat _6 Language- follow 3 step command _7 Language- read & follow direction _8 Write a sentence _9 Copy design _10 Total score _11 6CIT Screen 05/02/2021 05/01/2019 04/28/2018  What Year? 0 points 0 points 0 points  What month? 0 points 0 points 0 points  What time? 0 points 0 points 0 points  Count back from 20 0 points 0 points 0 points  Months in reverse 0 points 0 points 0 points  Repeat phrase 0 points 0 points -  Total Score 0 0 -    Immunizations Immunization History  Administered Date(s) Administered   Hep A / Hep B 01/01/2016, 02/04/2016, 07/03/2016   Influenza Split 03/03/2012, 01/26/2013, 02/25/2015   Influenza, High Dose Seasonal PF 02/20/2016, 01/31/2018, 01/08/2019, 02/27/2021   Influenza,inj,Quad PF,6+ Mos 02/13/2014   Influenza-Unspecified 02/19/2017, 02/13/2020   PFIZER Comirnaty(Gray Top)Covid-19 Tri-Sucrose Vaccine 08/19/2020   PFIZER(Purple Top)SARS-COV-2 Vaccination 06/09/2019, 07/01/2019, 02/13/2020   Pfizer Covid-19 Vaccine Bivalent Booster 2yr & up 02/27/2021   Pneumococcal Conjugate-13 11/08/2013, 01/08/2019   Pneumococcal Polysaccharide-23 03/03/2012   Tdap 08/08/2013   Zoster Recombinat (Shingrix) 03/02/2018, 06/13/2018   Zoster, Live 04/20/2008   Mammogram- ordered by gynecologist. Annual visits.  Foot exam- notes no changes. Denies wounds, numbness, tingling. Followed by pcp. Podiatry as needed. Plans to schedule with podiatry for callus.   Screening Tests Health Maintenance  Topic Date Due   MAMMOGRAM  08/03/2020   FOOT EXAM  05/28/2021 (Originally 08/22/2020)   OPHTHALMOLOGY EXAM  08/06/2021   HEMOGLOBIN A1C  09/14/2021   TETANUS/TDAP  08/09/2023   COLONOSCOPY (Pts 45-429yrInsurance coverage  will need to be confirmed)  05/01/2031   Pneumonia Vaccine 6540Years old  Completed   INFLUENZA VACCINE  Completed   DEXA SCAN  Completed   COVID-19 Vaccine  Completed   Hepatitis C Screening  Completed   Zoster Vaccines- Shingrix  Completed   HPV VACCINES  Aged Out   Health Maintenance Health Maintenance Due  Topic Date Due   MAMMOGRAM  08/03/2020   Lung Cancer Screening: (Low Dose CT Chest recommended if Age 74-80ears, 30 pack-year currently smoking OR have quit w/in 15years.) does not qualify.   Vision Screening: Recommended annual ophthalmology exams for early detection of glaucoma and other disorders of the eye.  Dental Screening: Recommended annual dental exams for proper oral hygiene  Community Resource Referral / Chronic Care Management: CRR required this visit?  No   CCM required this visit?  No      Plan:   Keep all routine maintenance appointments.   I have personally reviewed and noted the following in the patients chart:   Medical and social history Use of alcohol, tobacco or illicit drugs  Current medications and supplements including opioid prescriptions. Not taking opioid.  Functional ability and status Nutritional status Physical activity Advanced directives List of other physicians Hospitalizations, surgeries, and ER visits in previous 12 months Vitals Screenings to include cognitive, depression, and falls Referrals and appointments  In addition, I have reviewed and discussed with patient certain preventive protocols, quality metrics, and best practice recommendations. A written personalized care  plan for preventive services as well as general preventive health recommendations were provided to patient.     OBrien-Blaney, Abie Killian L, LPN   72/18/2883      I have reviewed the above information and agree with above.   Deborra Medina, MD

## 2021-05-13 ENCOUNTER — Encounter: Payer: Self-pay | Admitting: Radiology

## 2021-05-16 ENCOUNTER — Ambulatory Visit (INDEPENDENT_AMBULATORY_CARE_PROVIDER_SITE_OTHER): Payer: Medicare PPO | Admitting: Pharmacist

## 2021-05-16 DIAGNOSIS — E1169 Type 2 diabetes mellitus with other specified complication: Secondary | ICD-10-CM

## 2021-05-16 DIAGNOSIS — E785 Hyperlipidemia, unspecified: Secondary | ICD-10-CM

## 2021-05-16 DIAGNOSIS — I10 Essential (primary) hypertension: Secondary | ICD-10-CM

## 2021-05-16 MED ORDER — GLIPIZIDE 5 MG PO TABS: 2.5000 mg | ORAL_TABLET | Freq: Every day | ORAL | 1 refills | Status: DC

## 2021-05-16 NOTE — Patient Instructions (Addendum)
[name],   It was great talking to you today!  Check your blood sugars twice daily:  1) Fasting, first thing in the morning before breakfast and  2) 2 hours after your largest meal.   For a goal A1c of less than 7%, goal fasting readings are less than 130 and goal 2 hour after meal readings are less than 180.   Let's try adding back glipizide 2.5 mg (1/2 of a 5 mg tablet) once daily before breakfast. Continue metformin 1000 mg twice daily and Jardiance 25 mg daily. Please call me if you have any issues with low blood sugars.   Take care!  Catie Darnelle Maffucci, PharmD  Visit Information  Following are the goals we discussed today:  Patient Goals/Self-Care Activities Over the next 90 days, patient will:  - take medications as prescribed check glucose twice daily, document, and provide at future appointments          Plan: Telephone follow up appointment with care management team member scheduled for:  3 months (PCP in 6 weeks)   Catie Darnelle Maffucci, PharmD, [place], CPP Clinical Pharmacist [place] at [facility] [phone]   Please call the care guide team at [phone] if you need to cancel or reschedule your appointment.   Patient verbalizes understanding of instructions provided today and agrees to view in [place].

## 2021-05-16 NOTE — Chronic Care Management (AMB) (Signed)
Chronic Care Management CCM Pharmacy Note  05/16/2021 Name:  [name] MRN:  [id number] DOB:  [birth year]  Summary: - Fasting readings elevated per patient report. Last A1c still uncontrolled. Concern for side effects from GLP1, patient not interested in basal insulin  Recommendations/Changes made from today's visit: - Agree to restart glipizide 2.5 mg QAM with breakfast  Subjective: [name] is an 75 y.o. year old female who is a primary patient of Derrel Nip, Aris Everts, MD.  The CCM team was consulted for assistance with disease management and care coordination needs.    Engaged with patient by telephone for follow up visit for pharmacy case management and/or care coordination services.   Objective:  Medications Reviewed Today     Reviewed by De Hollingshead, RPH-CPP (Pharmacist) on 05/16/21 at 1123  Med List Status: <None>   Medication Order Taking? Sig Documenting Provider Last Dose Status Informant  ACCU-CHEK AVIVA PLUS test strip [id number]  TEST TWICE A DAY Crecencio Mc, MD  Active   atorvastatin (LIPITOR) 40 MG tablet [id number] Yes Take 1 tablet (40 mg total) by mouth daily. Crecencio Mc, MD Taking Active   blood glucose meter kit and supplies KIT [id number] Yes Dispense based on patient and insurance preference. Use up to four times daily as directed. (FOR ICD-9 250.00, 250.01). Crecencio Mc, MD Taking Active   calcium-vitamin D (OSCAL WITH D) 250-125 MG-UNIT per tablet [id number] Yes Take 2 tablets by mouth daily. [provider] Taking Active   COLLAGEN PO [id number] Yes daily. [provider] Taking Active   empagliflozin (JARDIANCE) 25 MG TABS tablet [id number] Yes Take 1 tablet (25 mg total) by mouth daily before breakfast. Crecencio Mc, MD Taking Active   famotidine (PEPCID) 20 MG tablet [id number] Yes TAKE 1 TABLET (20 MG TOTAL) BY MOUTH DAILY. [place] Crecencio Mc, MD Taking Active   Discontinued 08/06/11 1110  (Completed Course)   folic acid (FOLVITE) 121 MCG tablet [id number] Yes Take 2,400 mcg by mouth daily. [provider] Taking Active   linaclotide Rolan Lipa) 72 MCG capsule [id number] No Take 1 capsule (72 mcg total) by mouth daily.  Patient not taking: Reported on 05/16/2021   Virgel Manifold, MD Not Taking Active   losartan (COZAAR) 50 MG tablet [id number] Yes Take 1 tablet (50 mg total) by mouth daily. Crecencio Mc, MD Taking Active   metFORMIN (GLUCOPHAGE) 500 MG tablet [id number] Yes TAKE 2 TABLETS (1,000 MG TOTAL) BY MOUTH 2 (TWO) TIMES DAILY WITH A MEAL. Crecencio Mc, MD Taking Active   methotrexate 50 MG/2ML injection [id number] Yes 0.2 mL weekly [provider] Taking Active   Multiple Vitamin (MULTIVITAMIN) tablet [id number] Yes Take 1 tablet by mouth daily. [provider] Taking Active   omeprazole (PRILOSEC) 40 MG capsule [id number] Yes Take 40 mg by mouth daily. [provider]  Active   pantoprazole (PROTONIX) 40 MG tablet [id number] Yes TAKE 1 TABLET BY MOUTH EVERY DAY Einar Pheasant, MD Taking Active             Pertinent Labs:   Lab Results  Component Value Date   HGBA1C 7.6 (H) 03/17/2021   Lab Results  Component Value Date   CHOL 140 03/17/2021   HDL 59.60 03/17/2021   LDLCALC 69 03/17/2021   LDLDIRECT 81 07/29/2017   TRIG 58.0 03/17/2021   CHOLHDL 2 03/17/2021   Lab Results  Component Value Date   CREATININE 0.81 03/17/2021  BUN 14 03/17/2021   NA 138 03/17/2021   K 4.4 03/17/2021   CL 101 03/17/2021   CO2 29 03/17/2021    SDOH:  (Social Determinants of Health) assessments and interventions performed:  SDOH Interventions    Flowsheet Row Most Recent Value  SDOH Interventions   Financial Strain Interventions Intervention Not Indicated       CCM Care Plan  Review of patient past medical history, allergies, medications, health status, including review of consultants reports, laboratory and other test data,  was performed as part of comprehensive evaluation and provision of chronic care management services.   Care Plan : Medication Management  Updates made by De Hollingshead, RPH-CPP since 05/16/2021 12:00 AM     Problem: Diabetes, Hypertension, Hyperlipidemia      Long-Range Goal: Disease Progression Prevention   Start Date: 06/21/2020  Recent Progress: On track  Priority: High  Note:   Current Barriers:  Unable to achieve control of diabetes  Suboptimal therapeutic regimen for diabetes  Pharmacist Clinical Goal(s):  Over the next 90 days, patient will achieve control of diabetes as evidenced by A1c  through collaboration with PharmD and provider.   Interventions: 1:1 collaboration with Crecencio Mc, MD regarding development and update of comprehensive plan of care as evidenced by provider attestation and co-signature Inter-disciplinary care team collaboration (see longitudinal plan of care) Comprehensive medication review performed; medication list updated in electronic medical record  Health Maintenance   Yearly influenza vaccination: up to date Td/Tdap vaccination: up to date Pneumonia vaccination: up to date COVID vaccinations: up to date Shingrix vaccinations: up to date Colonoscopy: up to date Bone density scan: up to date Mammogram: up to date    Diabetes: Uncontrolled; current treatment regimen: metformin 1000 mg BID, Jardiance 25 mg daily; did not start back glipizide per Dr. Lupita Dawn recommendation Current glucose readings: fastings: 120-140s; 2 hours post breakfast: 120-140s; 2 hour post supper: 113-150s  Current physical activity: reports that she usually goes on a long walk Current meal patterns: breakfast: [employer] cereal, fruit, peanut butter, once a week cooks eggs, bacon, hashbrown; lunch: half chicken salad, split sides, diet coke if she's out, at home she drinks water; supper: quiche w/ salmon; sugar free  Next options would be adding back glipizide vs  trial of GLP1 vs basal insulin. Given elevated fasting readings but normal post prandial, would prefer GLP1 or basal insulin to target fastings. Patient concerned about impact of GLP1 on baseline bowel habits (periodic constipation). Not interested in basal insulin at this time. Agree to add low dose glipizide daily at this time. Extensive counseling given regarding low blood sugars, encouraged her to give me a call if development of hypoglycemia.  Consider GLP1 moving forward if Linzess positively impacts bowel habits Reviewed goal A1c, goal fasting, goal 2 hour post prandial readings  Hypertension: Controlled per last clinic visit; current treatment: losartan 50 mg daily  Previously recommended to continue current regimen at this time   Hyperlipidemia: Controlled per last lab work; current treatment: atorvastatin 40 mg  Previously recommended to continue current regimen at this time  Rheumatoid Arthritis: Controlled per patient report; current regimen: methotrexate 0.2 mL weekly, folic acid 2.4 mg (3 tablets of 0.8 mg) daily.  Previously recommended to continue current regimen along with rheumatology collaboration  GI Concerns: Acid reflux controlled per patient report; current regimen: pantoprazole 40 mg daily, famotidine 20 mg daily prior to supper S/p colonoscopy. Polyps benign. Has not started Linzess because the pharmacy did not  call her that it was ready Encouraged to contact her pharmacy to fill Linzess, trial the medication to see if improvement in GI symptoms. Follow up with GI as scheduled in March Recommended to continue current regimen along with avoidance of trigger foods.   Supplements: Calcium + Vitamin D   Patient Goals/Self-Care Activities Over the next 90 days, patient will:  - take medications as prescribed check glucose twice daily, document, and provide at future appointments       Plan: Telephone follow up appointment with care management team member  scheduled for:  3 months (PCP in 6 weeks)  Catie Darnelle Maffucci, PharmD, [place], [place] Pharmacist [employer] at [employer] [phone]

## 2021-05-17 ENCOUNTER — Other Ambulatory Visit: Payer: Self-pay | Admitting: Internal Medicine

## 2021-05-17 DIAGNOSIS — I10 Essential (primary) hypertension: Secondary | ICD-10-CM | POA: Diagnosis not present

## 2021-05-17 DIAGNOSIS — E1169 Type 2 diabetes mellitus with other specified complication: Secondary | ICD-10-CM

## 2021-05-17 DIAGNOSIS — E785 Hyperlipidemia, unspecified: Secondary | ICD-10-CM

## 2021-05-17 DIAGNOSIS — E782 Mixed hyperlipidemia: Secondary | ICD-10-CM

## 2021-06-06 DIAGNOSIS — Z8719 Personal history of other diseases of the digestive system: Secondary | ICD-10-CM | POA: Diagnosis not present

## 2021-06-06 DIAGNOSIS — R140 Abdominal distension (gaseous): Secondary | ICD-10-CM | POA: Diagnosis not present

## 2021-06-06 DIAGNOSIS — R110 Nausea: Secondary | ICD-10-CM | POA: Diagnosis not present

## 2021-06-11 ENCOUNTER — Encounter: Payer: Self-pay | Admitting: Podiatry

## 2021-06-11 ENCOUNTER — Ambulatory Visit: Payer: Medicare PPO | Admitting: Podiatry

## 2021-06-11 ENCOUNTER — Other Ambulatory Visit: Payer: Self-pay

## 2021-06-11 DIAGNOSIS — E119 Type 2 diabetes mellitus without complications: Secondary | ICD-10-CM

## 2021-06-11 DIAGNOSIS — E1169 Type 2 diabetes mellitus with other specified complication: Secondary | ICD-10-CM | POA: Insufficient documentation

## 2021-06-11 DIAGNOSIS — D2371 Other benign neoplasm of skin of right lower limb, including hip: Secondary | ICD-10-CM | POA: Diagnosis not present

## 2021-06-11 DIAGNOSIS — D2372 Other benign neoplasm of skin of left lower limb, including hip: Secondary | ICD-10-CM | POA: Diagnosis not present

## 2021-06-11 DIAGNOSIS — B351 Tinea unguium: Secondary | ICD-10-CM | POA: Diagnosis not present

## 2021-06-11 DIAGNOSIS — M79674 Pain in right toe(s): Secondary | ICD-10-CM | POA: Diagnosis not present

## 2021-06-12 NOTE — Progress Notes (Signed)
She presents today chief complaint of painful calluses bilaterally painfully elongated toenails bilaterally.  And she does not like her current orthotics.  Objective: Vital signs are stable she is alert and oriented x3.  Pulses are palpable.  She has a benign reactive skin lesions to the plantar aspect of bilateral foot and second digit of the right foot.  Mild hallux valgus deformities mild hammertoe deformity second right.  Some tenderness on palpation of the second metatarsal phalangeal joint plantarly.  Otherwise toenails are long thick yellow dystrophic with mycotic and painful benign skin lesions.  Assessment: Painful benign skin lesions nail dystrophy with pain in limb secondary to onychomycosis.  Plan: Debridement of toenails 1 through 5 bilaterally.  Debridement of all benign skin lesions.  She will follow-up with Aaron Edelman to see about either having her orthotics recovered or new set being built.  She will have to have cut out wear her other orthotics with cut out currently.  I will follow-up with her on an as-needed basis.

## 2021-06-25 ENCOUNTER — Telehealth: Payer: Self-pay | Admitting: Internal Medicine

## 2021-06-25 DIAGNOSIS — E1169 Type 2 diabetes mellitus with other specified complication: Secondary | ICD-10-CM

## 2021-06-25 NOTE — Addendum Note (Signed)
Addended by: Crecencio Mc on: 06/25/2021 01:29 PM   Modules accepted: Orders

## 2021-06-25 NOTE — Telephone Encounter (Signed)
Patient has a lab appt 07/01/2021, there are no orders in.

## 2021-06-27 ENCOUNTER — Other Ambulatory Visit: Payer: Self-pay

## 2021-06-27 ENCOUNTER — Ambulatory Visit: Payer: Medicare PPO

## 2021-06-27 DIAGNOSIS — M2041 Other hammer toe(s) (acquired), right foot: Secondary | ICD-10-CM

## 2021-06-27 DIAGNOSIS — D2372 Other benign neoplasm of skin of left lower limb, including hip: Secondary | ICD-10-CM

## 2021-06-27 DIAGNOSIS — E119 Type 2 diabetes mellitus without complications: Secondary | ICD-10-CM

## 2021-06-27 DIAGNOSIS — D2371 Other benign neoplasm of skin of right lower limb, including hip: Secondary | ICD-10-CM

## 2021-06-27 DIAGNOSIS — M2010 Hallux valgus (acquired), unspecified foot: Secondary | ICD-10-CM

## 2021-06-27 DIAGNOSIS — M2042 Other hammer toe(s) (acquired), left foot: Secondary | ICD-10-CM

## 2021-06-27 NOTE — Progress Notes (Signed)
SITUATION Reason for Consult: Evaluation for Prefabricated Diabetic Shoes and Bilateral Custom Diabetic Inserts. Patient / Caregiver Report: Patient would like well fitting shoes  OBJECTIVE DATA: Patient History / Diagnosis:    ICD-10-CM   1. Diabetes mellitus without complication (HCC)  Z61.0     2. Benign neoplasm of skin of left foot  D23.72     3. Benign neoplasm of skin of right foot  D23.71     4. Acquired hallux valgus, unspecified laterality  M20.10     5. Acquired hammertoes of both feet  M20.41    M20.42       Current or Previous Devices:   L3020 functional foot orthotics  In-Person Foot Examination: Ulcers & Callousing:   Historical  Toe / Foot Deformities:   - Hallux valgus - Hammertoes   Shoe Size: 11-2A  ORTHOTIC RECOMMENDATION Recommended Devices: - 1x pair prefabricated PDAC approved diabetic shoes: Patient selects [employer] [address]ace 11-2A - 3x pair custom-to-patient vacuum formed diabetic insoles.   GOALS OF SHOES AND INSOLES - Reduce shear and pressure - Reduce / Prevent callus formation - Reduce / Prevent ulceration - Protect the fragile healing compromised diabetic foot.  Patient would benefit from diabetic shoes and inserts as patient has diabetes mellitus and the patient has one or more of the following conditions: - Peripheral neuropathy with evidence of callus formation - Foot deformity - Poor circulation  ACTIONS PERFORMED Patient was casted for insoles via crush box and measured for shoes via brannock device. Procedure was explained and patient tolerated procedure well. All questions were answered and concerns addressed.  PLAN Patient is to ensure treating physician receives and completes diabetic paperwork. Casts and shoe order are to be held until paperwork is received. Once received patient is to be scheduled for fitting in four weeks.

## 2021-07-01 ENCOUNTER — Other Ambulatory Visit (INDEPENDENT_AMBULATORY_CARE_PROVIDER_SITE_OTHER): Payer: Medicare PPO

## 2021-07-01 ENCOUNTER — Other Ambulatory Visit: Payer: Self-pay

## 2021-07-01 DIAGNOSIS — E1169 Type 2 diabetes mellitus with other specified complication: Secondary | ICD-10-CM | POA: Diagnosis not present

## 2021-07-01 DIAGNOSIS — E785 Hyperlipidemia, unspecified: Secondary | ICD-10-CM | POA: Diagnosis not present

## 2021-07-01 LAB — HEMOGLOBIN A1C: Hgb A1c MFr Bld: 7.3 % — ABNORMAL HIGH (ref 4.6–6.5)

## 2021-07-02 LAB — COMPREHENSIVE METABOLIC PANEL
ALT: 12 U/L (ref 0–35)
AST: 23 U/L (ref 0–37)
Albumin: 4.1 g/dL (ref 3.5–5.2)
Alkaline Phosphatase: 78 U/L (ref 39–117)
BUN: 18 mg/dL (ref 6–23)
CO2: 29 mEq/L (ref 19–32)
Calcium: 9.4 mg/dL (ref 8.4–10.5)
Chloride: 101 mEq/L (ref 96–112)
Creatinine, Ser: 0.77 mg/dL (ref 0.40–1.20)
GFR: 76.04 mL/min (ref >60.00)
Glucose, Bld: 107 mg/dL — ABNORMAL HIGH (ref 70–99)
Potassium: 4.2 mEq/L (ref 3.5–5.1)
Sodium: 138 mEq/L (ref 135–145)
Total Bilirubin: 0.9 mg/dL (ref 0.2–1.2)
Total Protein: 7 g/dL (ref 6.0–8.3)

## 2021-07-02 LAB — LIPID PANEL
Cholesterol: 132 mg/dL (ref 0–200)
HDL: 59.3 mg/dL (ref >39.00)
LDL Cholesterol: 61 mg/dL (ref 0–99)
NonHDL: 72.29
Total CHOL/HDL Ratio: 2
Triglycerides: 54 mg/dL (ref 0.0–149.0)
VLDL: 10.8 mg/dL (ref 0.0–40.0)

## 2021-07-07 ENCOUNTER — Other Ambulatory Visit: Payer: Medicare PPO

## 2021-07-09 ENCOUNTER — Encounter: Payer: Self-pay | Admitting: Internal Medicine

## 2021-07-09 ENCOUNTER — Ambulatory Visit: Payer: Medicare PPO | Admitting: Internal Medicine

## 2021-07-09 ENCOUNTER — Other Ambulatory Visit: Payer: Self-pay

## 2021-07-09 VITALS — BP 112/70 | HR 64 | Temp 97.7°F | Ht 65.0 in | Wt 152.8 lb

## 2021-07-09 DIAGNOSIS — E785 Hyperlipidemia, unspecified: Secondary | ICD-10-CM | POA: Diagnosis not present

## 2021-07-09 DIAGNOSIS — E1169 Type 2 diabetes mellitus with other specified complication: Secondary | ICD-10-CM

## 2021-07-09 DIAGNOSIS — E782 Mixed hyperlipidemia: Secondary | ICD-10-CM | POA: Diagnosis not present

## 2021-07-09 DIAGNOSIS — I10 Essential (primary) hypertension: Secondary | ICD-10-CM | POA: Diagnosis not present

## 2021-07-09 DIAGNOSIS — L03031 Cellulitis of right toe: Secondary | ICD-10-CM | POA: Insufficient documentation

## 2021-07-09 MED ORDER — DOXYCYCLINE HYCLATE 100 MG PO TABS: 100.0000 mg | ORAL_TABLET | Freq: Two times a day (BID) | ORAL | 0 refills | Status: DC

## 2021-07-09 NOTE — Assessment & Plan Note (Addendum)
with redness and  bleeding .  Empiric antibiotics and salt water soaks

## 2021-07-09 NOTE — Progress Notes (Signed)
Subjective:  Patient ID: [name], female    DOB: 07-19-1945  Age: 76 y.o. MRN: [id number]  CC: The primary encounter diagnosis was Primary hypertension. Diagnoses of Hyperlipidemia associated with type 2 diabetes mellitus ([place]), Mixed hyperlipidemia, and Paronychia of second toe of right foot were also pertinent to this visit.   This visit occurred during the SARS-CoV-2 public health emergency.  Safety protocols were in place, including screening questions prior to the visit, additional usage of staff PPE, and extensive cleaning of exam room while observing appropriate contact time as indicated for disinfecting solutions.    HPI JERRE DIGUGLIELMO presents for follow up on  Chief Complaint  Patient presents with   Follow-up    Follow up on diabetes, hypertension, hyperlipidemia   1) T2DM:   She  feels generally well,   exercising regularly, but not losing  weight. Frustrated that her a1c is > 7.0  but Checking  blood sugars less  once or twice daily,  both fasting and post prandially ,  and BS have been under 120 fasting and < 150 post prandially  most of the time.  She denies any recent hypoglyemic events.  Taking  glipizide ,  metformin  and jardiance  medications as directed. Following a carbohydrate modified diet 6 days per week. Denies numbness, burning and tingling of extremities. Appetite is good.    2) Constipation:  taking linzess. In  the morning   ,  has had 2 "major blowouts"  from linzess use.  Stools somewhat unpredictable.  Still feels her abdomen is distended at times.  No pain , nausea or weight loss.  Has GI appt on March 7.  (Last CT abd Feb 2022 and normal except for diverticulosis )  3) Hypertension: patient checks blood pressure twice weekly at home.  Readings have been for the most part  < 140/80 at rest . Patient is following a reduce salt diet most days and is taking medications as prescribed   4) redness and bleeding of 2nd toe on right.  Started yesterday.  Nails  trimmed by podiatry 2-3 weeks ago    Outpatient Medications Prior to Visit  Medication Sig Dispense Refill   ACCU-CHEK AVIVA PLUS test strip TEST TWICE A DAY 100 strip 2   atorvastatin (LIPITOR) 40 MG tablet TAKE 1 TABLET BY MOUTH EVERY DAY 90 tablet 3   blood glucose meter kit and supplies KIT Dispense based on patient and insurance preference. Use up to four times daily as directed. (FOR ICD-9 250.00, 250.01). 1 each 0   calcium-vitamin D (OSCAL WITH D) 250-125 MG-UNIT per tablet Take 2 tablets by mouth daily.     COLLAGEN PO daily.     empagliflozin (JARDIANCE) 25 MG TABS tablet Take 1 tablet (25 mg total) by mouth daily before breakfast. 90 tablet 3   famotidine (PEPCID) 20 MG tablet TAKE 1 TABLET (20 MG TOTAL) BY MOUTH DAILY. 30 MINUTES BEFORE DINNER 90 tablet 1   folic acid (FOLVITE) 376 MCG tablet Take 2,400 mcg by mouth daily.     glipiZIDE (GLUCOTROL) 5 MG tablet Take 0.5 tablets (2.5 mg total) by mouth daily with breakfast. 45 tablet 1   linaclotide (LINZESS) 72 MCG capsule      losartan (COZAAR) 50 MG tablet TAKE 1 TABLET BY MOUTH EVERY DAY 90 tablet 3   metFORMIN (GLUCOPHAGE) 500 MG tablet TAKE 2 TABLETS (1,000 MG TOTAL) BY MOUTH 2 (TWO) TIMES DAILY WITH A MEAL. 360 tablet 1  methotrexate 50 MG/2ML injection 0.2 mL weekly  1   Multiple Vitamin (MULTIVITAMIN) tablet Take 1 tablet by mouth daily.     omeprazole (PRILOSEC) 40 MG capsule Take 40 mg by mouth daily. 30 capsule 1   ondansetron (ZOFRAN-ODT) 4 MG disintegrating tablet Take by mouth.     pantoprazole (PROTONIX) 40 MG tablet TAKE 1 TABLET BY MOUTH EVERY DAY 90 tablet 1   Probiotic Product (PROBIOTIC-10 PO) Take 1 capsule by mouth daily.     No facility-administered medications prior to visit.    Review of Systems;  Patient denies headache, fevers, malaise, unintentional weight loss, skin rash, eye pain, sinus congestion and sinus pain, sore throat, dysphagia,  hemoptysis , cough, dyspnea, wheezing, chest pain,  palpitations, orthopnea, edema, abdominal pain, nausea, melena, diarrhea, constipation, flank pain, dysuria, hematuria, urinary  Frequency, nocturia, numbness, tingling, seizures,  Focal weakness, Loss of consciousness,  Tremor, insomnia, depression, anxiety, and suicidal ideation.      Objective:  BP 112/70 (BP Location: Left Arm, Patient Position: Sitting, Cuff Size: Normal)    Pulse 64    Temp 97.7 F (36.5 C) (Oral)    Ht 5' 5"  (1.651 m)    Wt 152 lb 12.8 oz (69.3 kg)    SpO2 99%    BMI 25.43 kg/m   BP Readings from Last 3 Encounters:  07/09/21 112/70  04/30/21 130/70  04/23/21 (!) 136/95    Wt Readings from Last 3 Encounters:  07/09/21 152 lb 12.8 oz (69.3 kg)  05/02/21 146 lb (66.2 kg)  04/30/21 146 lb (66.2 kg)    General appearance: alert, cooperative and appears stated age Ears: normal TM's and external ear canals both ears Throat: lips, mucosa, and tongue normal; teeth and gums normal Neck: no adenopathy, no carotid bruit, supple, symmetrical, trachea midline and thyroid not enlarged, symmetric, no tenderness/mass/nodules Back: symmetric, no curvature. ROM normal. No CVA tenderness. Lungs: clear to auscultation bilaterally Heart: regular rate and rhythm, S1, S2 normal, no murmur, click, rub or gallop Abdomen: soft, non-tender; bowel sounds normal; no masses,  no organomegaly Pulses: 2+ and symmetric Skin: Skin color, texture, turgor normal. No rashes or lesions Lymph nodes: Cervical, supraclavicular, and axillary nodes normal.  Lab Results  Component Value Date   HGBA1C 7.3 (H) 07/01/2021   HGBA1C 7.6 (H) 03/17/2021   HGBA1C 7.8 (H) 12/13/2020    Lab Results  Component Value Date   CREATININE 0.77 07/01/2021   CREATININE 0.81 03/17/2021   CREATININE 0.67 12/13/2020    Lab Results  Component Value Date   WBC 3.8 (L) 03/17/2021   HGB 14.2 03/17/2021   HCT 43.8 03/17/2021   PLT 237.0 03/17/2021   GLUCOSE 107 (H) 07/01/2021   CHOL 132 07/01/2021   TRIG  54.0 07/01/2021   HDL 59.30 07/01/2021   LDLDIRECT 81 07/29/2017   LDLCALC 61 07/01/2021   ALT 12 07/01/2021   AST 23 07/01/2021   NA 138 07/01/2021   K 4.2 07/01/2021   CL 101 07/01/2021   CREATININE 0.77 07/01/2021   BUN 18 07/01/2021   CO2 29 07/01/2021   TSH 1.37 12/13/2020   HGBA1C 7.3 (H) 07/01/2021   MICROALBUR <0.7 12/13/2020    No results found.  Assessment & Plan:   Problem List Items Addressed This Visit     Hyperlipidemia   Relevant Orders   Lipid panel   Hyperlipidemia associated with type 2 diabetes mellitus ([place])   Relevant Orders   Hemoglobin A1c   Microalbumin / creatinine urine  ratio   Hypertension - Primary   Relevant Orders   Comprehensive metabolic panel   Microalbumin / creatinine urine ratio   Paronychia of second toe of right foot    with redness and  bleeding        I spent 30 minutes dedicated to the care of this patient on the date of this encounter to include pre-visit review of patient's medical history,  most recent imaging studies, Face-to-face time with the patient , and post visit ordering of testing and therapeutics.    Follow-up: Return in about 6 months (around 01/06/2022) for follow up diabetes.   Crecencio Mc, MD

## 2021-07-09 NOTE — Patient Instructions (Signed)
I am treating you for paronychia. (Toe infection)  Doxycycline twice daily WITH FOOD FOR 7 DAYS  Daily use of Probiotics for  3 weeks is strongly advised to reduce risk of C dificile colitis.    Return in 6 months

## 2021-07-10 ENCOUNTER — Other Ambulatory Visit: Payer: Self-pay | Admitting: *Deleted

## 2021-07-10 ENCOUNTER — Telehealth: Payer: Self-pay

## 2021-07-10 DIAGNOSIS — Z1231 Encounter for screening mammogram for malignant neoplasm of breast: Secondary | ICD-10-CM

## 2021-07-10 NOTE — Telephone Encounter (Signed)
Lmovm twice the first time was to ask where she wanted her mammo and the second time was to ask her if [facility] was ok for her mammo because we sent the order there for her to receive it

## 2021-07-11 ENCOUNTER — Other Ambulatory Visit: Payer: Self-pay | Admitting: Internal Medicine

## 2021-07-11 DIAGNOSIS — E1169 Type 2 diabetes mellitus with other specified complication: Secondary | ICD-10-CM

## 2021-07-16 DIAGNOSIS — M353 Polymyalgia rheumatica: Secondary | ICD-10-CM | POA: Diagnosis not present

## 2021-07-16 DIAGNOSIS — E119 Type 2 diabetes mellitus without complications: Secondary | ICD-10-CM | POA: Diagnosis not present

## 2021-07-16 DIAGNOSIS — M0609 Rheumatoid arthritis without rheumatoid factor, multiple sites: Secondary | ICD-10-CM | POA: Diagnosis not present

## 2021-07-22 ENCOUNTER — Encounter: Payer: Self-pay | Admitting: Gastroenterology

## 2021-07-22 ENCOUNTER — Ambulatory Visit: Payer: Self-pay | Admitting: Pharmacist

## 2021-07-22 ENCOUNTER — Ambulatory Visit (INDEPENDENT_AMBULATORY_CARE_PROVIDER_SITE_OTHER): Payer: Medicare PPO | Admitting: Gastroenterology

## 2021-07-22 ENCOUNTER — Other Ambulatory Visit: Payer: Self-pay

## 2021-07-22 VITALS — BP 147/82 | HR 75 | Temp 97.7°F | Ht 65.0 in | Wt 154.1 lb

## 2021-07-22 DIAGNOSIS — R140 Abdominal distension (gaseous): Secondary | ICD-10-CM

## 2021-07-22 DIAGNOSIS — R634 Abnormal weight loss: Secondary | ICD-10-CM

## 2021-07-22 NOTE — Chronic Care Management (AMB) (Signed)
?  Chronic Care Management  ? ?Note ? ?07/22/2021 ?Name: NIGEL WESSMAN MRN: [id number] DOB: 21-Oct-1945 ? ? ? ?Closing pharmacy CCM case at this time. Will collaborate with Care Guide to outreach to schedule follow up with RN CM. Patient has clinic contact information for future questions or concerns.  ? ?Catie Darnelle Maffucci, PharmD, [place], CPP ?Clinical Pharmacist ?Therapist, music at [employer] ?[phone] ? ?

## 2021-07-22 NOTE — Progress Notes (Signed)
?  ?[name], MD ?[address]  ?[address]  ?[place], [place] [address]  ?Main: [phone]  ?Fax: [phone] ? ? ? ?Gastroenterology Consultation ? ?Referring Provider:     Crecencio Mc, MD ?Primary Care Physician:  Crecencio Mc, MD ?Primary Gastroenterologist:  Dr. Bonna Gains ?Reason for Consultation:     Abdominal bloating ?      ? HPI:   ?[name] is a 76 y.o. female referred by Dr. Crecencio Mc, MD  for consultation & management of abdominal bloating.  Patient is currently being treated for constipation with Linzess 72 mcg daily.  Patient reports having 1-2 bowel movements daily, states that she has stool leakage.  She did have severe diarrhea when she first started taking medication, which has settled.  She continues to have significant abdominal bloating.  Her weight has been stable.  She did lose about 30 pounds within the last 1 year because of lack of appetite.  Patient underwent colonoscopy in 04/30/2021 which was unremarkable including biopsies.  Patient does not drink carbonated beverages, does not consume artificial sweeteners ? ?NSAIDs: None ? ?Antiplts/Anticoagulants/Anti thrombotics: None ? ?GI Procedures: Colonoscopy 04/30/2021 ?- One 4 mm polyp in the cecum, removed with a jumbo cold forceps. Resected and retrieved. ?- One 5 mm polyp in the descending colon, removed with a jumbo cold forceps. Resected and retrieved. ?- Tortuous colon. ?- Diverticulosis in the sigmoid colon. ?- The examination was otherwise normal. ?- Non-bleeding internal hemorrhoids. ?DIAGNOSIS:  ?A. COLON POLYP, CECUM; COLD BIOPSY:  ?- POLYPOID BENIGN COLONIC MUCOSA.  ?- NEGATIVE FOR DYSPLASIA AND MALIGNANCY.  ? ?B. COLON, RANDOM; COLD BIOPSY:  ?- COLONIC MUCOSA WITH NO SIGNIFICANT PATHOLOGIC ALTERATION.  ?- NEGATIVE FOR ACTIVE INFLAMMATION AND FEATURES OF CHRONICITY.  ?- NEGATIVE FOR MICROSCOPIC COLITIS, DYSPLASIA, AND MALIGNANCY.  ? ?C. COLON POLYP, DESCENDING; COLD BIOPSY:  ?- FEATURES SUGGESTIVE OF  HYPERPLASTIC POLYP.  ?- NEGATIVE FOR DYSPLASIA AND MALIGNANCY.  ? ?Past Medical History:  ?Diagnosis Date  ? COVID-19 virus infection 12/10/2020  ? Positive test December 04 2020   ? Diabetes mellitus   ? Gastritis   ? High cholesterol   ? Hyperlipidemia   ? Lipitor trial was a failure  ? Hypertension   ? PMR (polymyalgia rheumatica) (HCC)   ? Syncope 01/15/2019  ? ? ?Past Surgical History:  ?Procedure Laterality Date  ? [place]  ? CARDIAC CATHETERIZATION  12/16/2005  ? normal coronaries,  EF 60%  ? COLONOSCOPY WITH PROPOFOL N/A 04/30/2021  ? Procedure: COLONOSCOPY WITH PROPOFOL;  Surgeon: Virgel Manifold, MD;  Location: [facility] ENDOSCOPY;  Service: Endoscopy;  Laterality: N/A;  DM  ? SPINE SURGERY  05/18/1993  ? SPINE SURGERY    ? bone graft   ? TUBAL LIGATION    ? ?Current Outpatient Medications:  ?  ACCU-CHEK AVIVA PLUS test strip, USE TO TEST TWICE A DAY, Disp: 100 strip, Rfl: 2 ?  atorvastatin (LIPITOR) 40 MG tablet, TAKE 1 TABLET BY MOUTH EVERY DAY, Disp: 90 tablet, Rfl: 3 ?  blood glucose meter kit and supplies KIT, Dispense based on patient and insurance preference. Use up to four times daily as directed. (FOR ICD-9 250.00, 250.01)., Disp: 1 each, Rfl: 0 ?  calcium-vitamin D (OSCAL WITH D) 250-125 MG-UNIT per tablet, Take 2 tablets by mouth daily., Disp: , Rfl:  ?  COLLAGEN PO, daily., Disp: , Rfl:  ?  empagliflozin (JARDIANCE) 25 MG TABS tablet, Take 1 tablet (25 mg total)  by mouth daily before breakfast., Disp: 90 tablet, Rfl: 3 ?  famotidine (PEPCID) 20 MG tablet, TAKE 1 TABLET (20 MG TOTAL) BY MOUTH DAILY. [place], Disp: 90 tablet, Rfl: 1 ?  folic acid (FOLVITE) 295 MCG tablet, Take 2,400 mcg by mouth daily., Disp: , Rfl:  ?  glipiZIDE (GLUCOTROL) 5 MG tablet, Take 0.5 tablets (2.5 mg total) by mouth daily with breakfast., Disp: 45 tablet, Rfl: 1 ?  linaclotide (LINZESS) 72 MCG capsule, , Disp: , Rfl:  ?  losartan (COZAAR) 50 MG tablet, TAKE 1 TABLET BY MOUTH EVERY DAY, Disp: 90  tablet, Rfl: 3 ?  metFORMIN (GLUCOPHAGE) 500 MG tablet, TAKE 2 TABLETS (1,000 MG TOTAL) BY MOUTH 2 (TWO) TIMES DAILY WITH A MEAL., Disp: 360 tablet, Rfl: 1 ?  methotrexate 50 MG/2ML injection, 0.2 mL weekly, Disp: , Rfl: 1 ?  Multiple Vitamin (MULTIVITAMIN) tablet, Take 1 tablet by mouth daily., Disp: , Rfl:  ?  omeprazole (PRILOSEC) 40 MG capsule, Take 40 mg by mouth daily., Disp: 30 capsule, Rfl: 1 ?  ondansetron (ZOFRAN-ODT) 4 MG disintegrating tablet, Take by mouth., Disp: , Rfl:  ?  pantoprazole (PROTONIX) 40 MG tablet, TAKE 1 TABLET BY MOUTH EVERY DAY, Disp: 90 tablet, Rfl: 1 ?  Probiotic Product (PROBIOTIC-10 PO), Take 1 capsule by mouth daily., Disp: , Rfl:  ? ?Family History  ?Problem Relation Age of Onset  ? Diabetes Mother   ? Heart disease Father   ? Stroke Father   ? Diabetes Father   ? Diabetes Maternal Grandfather   ? Diabetes Brother   ? Diabetes Brother   ? Breast cancer Neg Hx   ?  ? ?Social History  ? ?Tobacco Use  ? Smoking status: Never  ? Smokeless tobacco: Never  ?Vaping Use  ? Vaping Use: Never used  ?Substance Use Topics  ? Alcohol use: No  ? Drug use: No  ? ? ?Allergies as of 07/22/2021 - Review Complete 07/22/2021  ?Allergen Reaction Noted  ? Amoxicillin Hives 02/11/2011  ? Latex Rash 01/06/2013  ? ? ?Review of Systems:    ?All systems reviewed and negative except where noted in HPI. ? ? Physical Exam:  ?BP (!) 147/82 (BP Location: Left Arm, Patient Position: Sitting, Cuff Size: Normal)   Pulse 75   Temp 97.7 ?F (36.5 ?C) (Oral)   Ht 5' 5"  (1.651 m)   Wt 154 lb 2 oz (69.9 kg)   BMI 25.65 kg/m?  ?No LMP recorded. Patient is postmenopausal. ? ?General:   Alert,  Well-developed, well-nourished, pleasant and cooperative in NAD ?Head:  Normocephalic and atraumatic. ?Eyes:  Sclera clear, no icterus.   Conjunctiva pink. ?Ears:  Normal auditory acuity. ?Nose:  No deformity, discharge, or lesions. ?Mouth:  No deformity or lesions,oropharynx pink & moist. ?Neck:  Supple; no masses or  thyromegaly. ?Lungs:  Respirations even and unlabored.  Clear throughout to auscultation.   No wheezes, crackles, or rhonchi. No acute distress. ?Heart:  Regular rate and rhythm; no murmurs, clicks, rubs, or gallops. ?Abdomen:  Normal bowel sounds. Soft, non-tender and moderately distended, tympanic without masses, hepatosplenomegaly or hernias noted.  No guarding or rebound tenderness.   ?Rectal: Not performed ?Msk:  Symmetrical without gross deformities. Good, equal movement & strength bilaterally. ?Pulses:  Normal pulses noted. ?Extremities:  No clubbing or edema.  No cyanosis. ?Neurologic:  Alert and oriented x3;  grossly normal neurologically. ?Skin:  Intact without significant lesions or rashes. No jaundice. ?Psych:  Alert and cooperative. Normal  mood and affect. ? ?Imaging Studies: ?Reviewed ? ?Assessment and Plan:  ? ?[name] is a 77 y.o. female with diabetes, history of fatty liver is seen in consultation for follow-up of change in bowel habits as well as significant abdominal bloating and prior history of weight loss.  Patient underwent CT abdomen and pelvis with contrast which was unremarkable.  Colonoscopy in 2022 was unremarkable.  Due to ongoing symptoms, recommend to check pancreatic fecal elastase levels, H. pylori stool antigen, celiac panel ?Trial of IBgard, samples provided ? ? ?Follow up in 3 to 4 months ? ? ?[name], MD ? ?

## 2021-07-22 NOTE — Patient Instructions (Signed)
Gave Ibguard samples  ?

## 2021-07-24 ENCOUNTER — Telehealth: Payer: Self-pay

## 2021-07-24 DIAGNOSIS — M353 Polymyalgia rheumatica: Secondary | ICD-10-CM | POA: Diagnosis not present

## 2021-07-24 DIAGNOSIS — M0609 Rheumatoid arthritis without rheumatoid factor, multiple sites: Secondary | ICD-10-CM | POA: Diagnosis not present

## 2021-07-24 DIAGNOSIS — E119 Type 2 diabetes mellitus without complications: Secondary | ICD-10-CM | POA: Diagnosis not present

## 2021-07-24 LAB — CELIAC DISEASE PANEL
Endomysial IgA: NEGATIVE
IgA/Immunoglobulin A, Serum: 423 mg/dL — ABNORMAL HIGH (ref 64–422)
Transglutaminase IgA: <2 U/mL (ref 0–3)

## 2021-07-24 NOTE — Telephone Encounter (Signed)
Casts sent to [employer] ?

## 2021-07-24 NOTE — Telephone Encounter (Signed)
Shoes Ordered - NB [address] 11-2A ?

## 2021-07-29 ENCOUNTER — Other Ambulatory Visit: Payer: Self-pay | Admitting: Gastroenterology

## 2021-07-29 DIAGNOSIS — K8689 Other specified diseases of pancreas: Secondary | ICD-10-CM

## 2021-07-29 DIAGNOSIS — R140 Abdominal distension (gaseous): Secondary | ICD-10-CM | POA: Diagnosis not present

## 2021-07-29 DIAGNOSIS — R634 Abnormal weight loss: Secondary | ICD-10-CM | POA: Diagnosis not present

## 2021-07-31 LAB — H. PYLORI ANTIGEN, STOOL: H pylori Ag, Stl: NEGATIVE

## 2021-08-03 LAB — PANCREATIC ELASTASE, FECAL: Pancreatic Elastase, Fecal: 177 ug Elast./g — ABNORMAL LOW (ref >200)

## 2021-08-05 ENCOUNTER — Other Ambulatory Visit: Payer: Self-pay | Admitting: Internal Medicine

## 2021-08-05 ENCOUNTER — Telehealth: Payer: Self-pay

## 2021-08-05 DIAGNOSIS — K8689 Other specified diseases of pancreas: Secondary | ICD-10-CM | POA: Insufficient documentation

## 2021-08-05 NOTE — Telephone Encounter (Signed)
-----   Message from Lin Landsman, MD sent at 08/04/2021 11:20 PM EDT ----- ?Caryl Pina ? ?Please inform patient that her pancreatic fecal elastase levels are low which indicate moderate pancreatic insufficiency and explains her GI symptoms.  Recommend treatment with pancreatic enzymes Creon or Zenpep 2 capsules with each meal and 1 with snack.  I will see her for follow-up as scheduled ? ?Rohini Vanga ?

## 2021-08-05 NOTE — Assessment & Plan Note (Signed)
Confirmed with low fecal elastase levels per GU evaluation.  Pancreatic enzymes prescribed ?

## 2021-08-05 NOTE — Telephone Encounter (Signed)
Called and left a message for call back  

## 2021-08-06 MED ORDER — PANCRELIPASE (LIP-PROT-AMYL) 36000-114000 UNITS PO CPEP: ORAL_CAPSULE | ORAL | 5 refills | Status: DC

## 2021-08-06 NOTE — Telephone Encounter (Signed)
Called and left a message for call back and sent a mychart message with results  ?

## 2021-08-07 ENCOUNTER — Telehealth: Payer: Self-pay

## 2021-08-07 NOTE — Chronic Care Management (AMB) (Signed)
?  Chronic Care Management  ? ?Note ? ?08/07/2021 ?Name: [name] MRN: [id number] DOB: Dec 23, 1945 ? ?NIESHA BAME is a 76 y.o. year old female who is a primary care patient of Derrel Nip, Aris Everts, MD. [name] is currently enrolled in care management services. An additional referral for RN CM  was placed.  ? ?Follow up plan: ?Patient declines further follow up and engagement by the care management team. Appropriate care team members and provider have been notified via electronic communication.  ? ?Noreene Larsson, RMA ?Care Guide, Embedded Care Coordination ?[facility]  ?[place],  [address] ?Direct Dial: [phone] ?[email or web address] ?[email or web address]: [email or web address]  ? ?

## 2021-08-08 ENCOUNTER — Telehealth: Payer: Medicare PPO

## 2021-08-11 DIAGNOSIS — E119 Type 2 diabetes mellitus without complications: Secondary | ICD-10-CM | POA: Diagnosis not present

## 2021-08-11 LAB — HM DIABETES EYE EXAM

## 2021-08-13 DIAGNOSIS — Z1231 Encounter for screening mammogram for malignant neoplasm of breast: Secondary | ICD-10-CM | POA: Diagnosis not present

## 2021-08-14 ENCOUNTER — Other Ambulatory Visit: Payer: Self-pay | Admitting: Internal Medicine

## 2021-08-15 ENCOUNTER — Telehealth: Payer: Medicare PPO

## 2021-08-18 ENCOUNTER — Telehealth: Payer: Self-pay | Admitting: Gastroenterology

## 2021-08-18 MED ORDER — LINACLOTIDE 72 MCG PO CAPS: 72.0000 ug | ORAL_CAPSULE | Freq: Every day | ORAL | 1 refills | Status: DC

## 2021-08-18 NOTE — Addendum Note (Signed)
Addended by: Ulyess Blossom L on: 08/18/2021 11:50 AM ? ? Modules accepted: Orders ? ?

## 2021-08-18 NOTE — Telephone Encounter (Signed)
Patient states that she needs a refill today. Requesting call back asap.  ?

## 2021-08-18 NOTE — Telephone Encounter (Signed)
Patient states she needs a refill On linzess 72 mcg. Sent medication to the pharmacy  ?

## 2021-08-19 ENCOUNTER — Encounter: Payer: Self-pay | Admitting: *Deleted

## 2021-08-23 ENCOUNTER — Other Ambulatory Visit: Payer: Self-pay | Admitting: Internal Medicine

## 2021-08-23 DIAGNOSIS — E1169 Type 2 diabetes mellitus with other specified complication: Secondary | ICD-10-CM

## 2021-09-18 ENCOUNTER — Telehealth: Payer: Self-pay

## 2021-09-18 NOTE — Telephone Encounter (Signed)
Spoke with patient - NB shoes are backordered indefinitely. Asked if patient was willing to substitute and patient agreed to a white lace up sneaker. Ordered Joanell Rising 11-M ?

## 2021-09-20 ENCOUNTER — Other Ambulatory Visit: Payer: Self-pay | Admitting: Internal Medicine

## 2021-09-20 DIAGNOSIS — E1169 Type 2 diabetes mellitus with other specified complication: Secondary | ICD-10-CM

## 2021-10-02 NOTE — Assessment & Plan Note (Addendum)
Slight improvement in a1c with addition of Jardiance but less than expected since stopping glipizide.  Was advsied to check post prandial lunch sugars for the next week,  Continue 2.5 mg glipizide.  continue statin , LDL at goal   Lab Results  Component Value Date   HGBA1C 7.3 (H) 07/01/2021   Lab Results  Component Value Date   MICROALBUR <0.7 12/13/2020   MICROALBUR 1.2 02/22/2020     Lab Results  Component Value Date   LDLCALC 61 07/01/2021

## 2021-10-09 DIAGNOSIS — R0602 Shortness of breath: Secondary | ICD-10-CM | POA: Diagnosis not present

## 2021-10-09 DIAGNOSIS — E782 Mixed hyperlipidemia: Secondary | ICD-10-CM | POA: Diagnosis not present

## 2021-10-09 DIAGNOSIS — I6523 Occlusion and stenosis of bilateral carotid arteries: Secondary | ICD-10-CM | POA: Diagnosis not present

## 2021-10-09 DIAGNOSIS — I10 Essential (primary) hypertension: Secondary | ICD-10-CM | POA: Diagnosis not present

## 2021-10-09 DIAGNOSIS — I471 Supraventricular tachycardia: Secondary | ICD-10-CM | POA: Diagnosis not present

## 2021-10-15 DIAGNOSIS — D485 Neoplasm of uncertain behavior of skin: Secondary | ICD-10-CM | POA: Diagnosis not present

## 2021-10-15 DIAGNOSIS — D2262 Melanocytic nevi of left upper limb, including shoulder: Secondary | ICD-10-CM | POA: Diagnosis not present

## 2021-10-15 DIAGNOSIS — Z85828 Personal history of other malignant neoplasm of skin: Secondary | ICD-10-CM | POA: Diagnosis not present

## 2021-10-15 DIAGNOSIS — D044 Carcinoma in situ of skin of scalp and neck: Secondary | ICD-10-CM | POA: Diagnosis not present

## 2021-10-15 DIAGNOSIS — L814 Other melanin hyperpigmentation: Secondary | ICD-10-CM | POA: Diagnosis not present

## 2021-10-15 DIAGNOSIS — D2261 Melanocytic nevi of right upper limb, including shoulder: Secondary | ICD-10-CM | POA: Diagnosis not present

## 2021-10-15 DIAGNOSIS — D2272 Melanocytic nevi of left lower limb, including hip: Secondary | ICD-10-CM | POA: Diagnosis not present

## 2021-10-22 ENCOUNTER — Ambulatory Visit: Payer: Medicare PPO | Admitting: Gastroenterology

## 2021-10-22 ENCOUNTER — Encounter: Payer: Self-pay | Admitting: Gastroenterology

## 2021-10-22 ENCOUNTER — Other Ambulatory Visit: Payer: Self-pay

## 2021-10-22 VITALS — BP 126/78 | HR 72 | Temp 97.7°F | Ht 65.0 in | Wt 156.0 lb

## 2021-10-22 DIAGNOSIS — K8689 Other specified diseases of pancreas: Secondary | ICD-10-CM | POA: Diagnosis not present

## 2021-10-22 DIAGNOSIS — K5904 Chronic idiopathic constipation: Secondary | ICD-10-CM | POA: Diagnosis not present

## 2021-10-22 DIAGNOSIS — R140 Abdominal distension (gaseous): Secondary | ICD-10-CM

## 2021-10-22 NOTE — Patient Instructions (Signed)
Gave Xifaxan samples to take 1 tablet by mouth twice a day for 7 days

## 2021-10-22 NOTE — Progress Notes (Signed)
[name], MD [address]  [place]  [place], [place] [address]  Main: [phone]  Fax: [phone]    Gastroenterology Consultation  Referring Provider:     Crecencio Mc, MD Primary Care Physician:  Crecencio Mc, MD Primary Gastroenterologist:  Dr. Bonna Gains Reason for Consultation:     Abdominal bloating        HPI:   [name] is a 76 y.o. female referred by Dr. Crecencio Mc, MD  for consultation & management of abdominal bloating.  Patient is currently being treated for constipation with Linzess 72 mcg daily.  Patient reports having 1-2 bowel movements daily, states that she has stool leakage.  She did have severe diarrhea when she first started taking medication, which has settled.  She continues to have significant abdominal bloating.  Her weight has been stable.  She did lose about 30 pounds within the last 1 year because of lack of appetite.  Patient underwent colonoscopy in 04/30/2021 which was unremarkable including biopsies.  Patient does not drink carbonated beverages, does not consume artificial sweeteners  Follow-up visit 10/22/2021 Patient is here for follow-up of abdominal bloating and constipation.  Patient is taking Linzess 72 mcg daily which results in 2-3 good bowel movements daily, sometimes explosive.  She reports that her abdominal bloating is persistent.  She underwent pancreatic fecal elastase levels which were mildly low, started on Creon 36 K 2 capsules with each meal and 1 with snack.  However, patient did not notice significant improvement in her bloating.  Patient is going on vacation to [place] this Sunday.  NSAIDs: None  Antiplts/Anticoagulants/Anti thrombotics: None  GI Procedures: Colonoscopy 04/30/2021 - One 4 mm polyp in the cecum, removed with a jumbo cold forceps. Resected and retrieved. - One 5 mm polyp in the descending colon, removed with a jumbo cold forceps. Resected and retrieved. - Tortuous colon. - Diverticulosis  in the sigmoid colon. - The examination was otherwise normal. - Non-bleeding internal hemorrhoids. DIAGNOSIS:  A. COLON POLYP, CECUM; COLD BIOPSY:  - POLYPOID BENIGN COLONIC MUCOSA.  - NEGATIVE FOR DYSPLASIA AND MALIGNANCY.   B. COLON, RANDOM; COLD BIOPSY:  - COLONIC MUCOSA WITH NO SIGNIFICANT PATHOLOGIC ALTERATION.  - NEGATIVE FOR ACTIVE INFLAMMATION AND FEATURES OF CHRONICITY.  - NEGATIVE FOR MICROSCOPIC COLITIS, DYSPLASIA, AND MALIGNANCY.   C. COLON POLYP, DESCENDING; COLD BIOPSY:  - FEATURES SUGGESTIVE OF HYPERPLASTIC POLYP.  - NEGATIVE FOR DYSPLASIA AND MALIGNANCY.   Past Medical History:  Diagnosis Date   COVID-19 virus infection 12/10/2020   Positive test December 04 2020    Diabetes mellitus    Gastritis    High cholesterol    Hyperlipidemia    Lipitor trial was a failure   Hypertension    PMR (polymyalgia rheumatica) ([place])    Syncope 01/15/2019    Past Surgical History:  Procedure Laterality Date   BACK SURGERY  1995   CARDIAC CATHETERIZATION  12/16/2005   normal coronaries,  EF 60%   COLONOSCOPY WITH PROPOFOL N/A 04/30/2021   Procedure: COLONOSCOPY WITH PROPOFOL;  Surgeon: Virgel Manifold, MD;  Location: [facility] ENDOSCOPY;  Service: Endoscopy;  Laterality: N/A;  DM   SPINE SURGERY  05/18/1993   SPINE SURGERY     bone graft    TUBAL LIGATION     Current Outpatient Medications:    ACCU-CHEK AVIVA PLUS test strip, USE TO TEST TWICE A DAY, Disp: 100 strip, Rfl: 2   atorvastatin (LIPITOR) 40 MG tablet, TAKE 1  TABLET BY MOUTH EVERY DAY, Disp: 90 tablet, Rfl: 3   blood glucose meter kit and supplies KIT, Dispense based on patient and insurance preference. Use up to four times daily as directed. (FOR ICD-9 250.00, 250.01)., Disp: 1 each, Rfl: 0   calcium-vitamin D (OSCAL WITH D) 250-125 MG-UNIT per tablet, Take 2 tablets by mouth daily., Disp: , Rfl:    COLLAGEN PO, daily., Disp: , Rfl:    famotidine (PEPCID) 20 MG tablet, TAKE 1 TABLET (20 MG TOTAL) BY MOUTH DAILY.  [place], Disp: 90 tablet, Rfl: 1   folic acid (FOLVITE) 665 MCG tablet, Take 2,400 mcg by mouth daily., Disp: , Rfl:    glipiZIDE (GLUCOTROL) 5 MG tablet, Take 0.5 tablets (2.5 mg total) by mouth daily with breakfast., Disp: 45 tablet, Rfl: 1   JARDIANCE 25 MG TABS tablet, TAKE 1 TABLET BY MOUTH DAILY BEFORE BREAKFAST., Disp: 90 tablet, Rfl: 3   linaclotide (LINZESS) 72 MCG capsule, Take 1 capsule (72 mcg total) by mouth daily before breakfast., Disp: 90 capsule, Rfl: 1   lipase/protease/amylase (CREON) 36000 UNITS CPEP capsule, Take 2 capsules with the first bite of each meal and 1 capsule with the first bite of each snack, Disp: 240 capsule, Rfl: 5   losartan (COZAAR) 50 MG tablet, TAKE 1 TABLET BY MOUTH EVERY DAY, Disp: 90 tablet, Rfl: 3   metFORMIN (GLUCOPHAGE) 500 MG tablet, TAKE 2 TABLETS (1,000 MG TOTAL) BY MOUTH 2 (TWO) TIMES DAILY WITH A MEAL., Disp: 360 tablet, Rfl: 1   Multiple Vitamin (MULTIVITAMIN) tablet, Take 1 tablet by mouth daily., Disp: , Rfl:    omeprazole (PRILOSEC) 40 MG capsule, Take 40 mg by mouth daily., Disp: 30 capsule, Rfl: 1   pantoprazole (PROTONIX) 40 MG tablet, TAKE 1 TABLET BY MOUTH EVERY DAY, Disp: 90 tablet, Rfl: 1   Probiotic Product (PROBIOTIC-10 PO), Take 1 capsule by mouth daily., Disp: , Rfl:   Family History  Problem Relation Age of Onset   Diabetes Mother    Heart disease Father    Stroke Father    Diabetes Father    Diabetes Maternal Grandfather    Diabetes Brother    Diabetes Brother    Breast cancer Neg Hx      Social History   Tobacco Use   Smoking status: Never   Smokeless tobacco: Never  Vaping Use   Vaping Use: Never used  Substance Use Topics   Alcohol use: No   Drug use: No    Allergies as of 10/22/2021 - Review Complete 10/22/2021  Allergen Reaction Noted   Amoxicillin Hives 02/11/2011   Latex Rash 01/06/2013    Review of Systems:    All systems reviewed and negative except where noted in HPI.    Physical Exam:  BP 126/78 (BP Location: Left Arm, Patient Position: Sitting, Cuff Size: Normal)   Pulse 72   Temp 97.7 F (36.5 C) (Oral)   Ht _0  (1.651 m)   Wt 156 lb (70.8 kg)   BMI 25.96 kg/m  No LMP recorded. Patient is postmenopausal.  General:   Alert,  Well-developed, well-nourished, pleasant and cooperative in NAD Head:  Normocephalic and atraumatic. Eyes:  Sclera clear, no icterus.   Conjunctiva pink. Ears:  Normal auditory acuity. Nose:  No deformity, discharge, or lesions. Mouth:  No deformity or lesions,oropharynx pink & moist. Neck:  Supple; no masses or thyromegaly. Lungs:  Respirations even and unlabored.  Clear throughout to auscultation.   No wheezes, crackles, or  rhonchi. No acute distress. Heart:  Regular rate and rhythm; no murmurs, clicks, rubs, or gallops. Abdomen:  Normal bowel sounds. Soft, non-tender and moderately distended, tympanic without masses, hepatosplenomegaly or hernias noted.  No guarding or rebound tenderness.   Rectal: Not performed Msk:  Symmetrical without gross deformities. Good, equal movement & strength bilaterally. Pulses:  Normal pulses noted. Extremities:  No clubbing or edema.  No cyanosis. Neurologic:  Alert and oriented x3;  grossly normal neurologically. Skin:  Intact without significant lesions or rashes. No jaundice. Psych:  Alert and cooperative. Normal mood and affect.  Imaging Studies: Reviewed  Assessment and Plan:   [name] is a 76 y.o. female with diabetes, history of fatty liver is seen in consultation for follow-up of change in bowel habits as well as significant abdominal bloating and prior history of weight loss.  Patient underwent CT abdomen and pelvis with contrast which was unremarkable.  Colonoscopy in 2022 was unremarkable. Pancreatic fecal elastase levels are mildly low, H. pylori stool antigen, celiac panel are negative Patient tried IBgard samples, have resulted in nausea Advised patient to continue  taking Linzess 72 mcg daily or every other day Decrease Creon to 1 capsule with each meal Trial of Xifaxan 550 mg twice daily for 1 week for abdominal bloating, samples provided   Follow up as needed   [name], MD

## 2021-11-05 ENCOUNTER — Other Ambulatory Visit: Payer: Self-pay | Admitting: Internal Medicine

## 2021-11-12 DIAGNOSIS — Z13820 Encounter for screening for osteoporosis: Secondary | ICD-10-CM | POA: Diagnosis not present

## 2021-11-12 DIAGNOSIS — D0461 Carcinoma in situ of skin of right upper limb, including shoulder: Secondary | ICD-10-CM | POA: Diagnosis not present

## 2021-11-12 DIAGNOSIS — M0609 Rheumatoid arthritis without rheumatoid factor, multiple sites: Secondary | ICD-10-CM | POA: Diagnosis not present

## 2021-11-12 DIAGNOSIS — M353 Polymyalgia rheumatica: Secondary | ICD-10-CM | POA: Diagnosis not present

## 2021-11-12 DIAGNOSIS — M19042 Primary osteoarthritis, left hand: Secondary | ICD-10-CM | POA: Diagnosis not present

## 2021-11-12 DIAGNOSIS — M19041 Primary osteoarthritis, right hand: Secondary | ICD-10-CM | POA: Diagnosis not present

## 2021-11-14 DIAGNOSIS — I471 Supraventricular tachycardia: Secondary | ICD-10-CM | POA: Diagnosis not present

## 2021-11-14 DIAGNOSIS — R0602 Shortness of breath: Secondary | ICD-10-CM | POA: Diagnosis not present

## 2021-11-19 DIAGNOSIS — M8588 Other specified disorders of bone density and structure, other site: Secondary | ICD-10-CM | POA: Diagnosis not present

## 2021-12-01 ENCOUNTER — Other Ambulatory Visit: Payer: Self-pay | Admitting: Internal Medicine

## 2021-12-01 DIAGNOSIS — E1169 Type 2 diabetes mellitus with other specified complication: Secondary | ICD-10-CM

## 2021-12-15 DIAGNOSIS — R0602 Shortness of breath: Secondary | ICD-10-CM | POA: Diagnosis not present

## 2021-12-15 DIAGNOSIS — E782 Mixed hyperlipidemia: Secondary | ICD-10-CM | POA: Diagnosis not present

## 2021-12-15 DIAGNOSIS — I10 Essential (primary) hypertension: Secondary | ICD-10-CM | POA: Diagnosis not present

## 2022-01-06 ENCOUNTER — Other Ambulatory Visit (INDEPENDENT_AMBULATORY_CARE_PROVIDER_SITE_OTHER): Payer: Medicare PPO

## 2022-01-06 DIAGNOSIS — E785 Hyperlipidemia, unspecified: Secondary | ICD-10-CM

## 2022-01-06 DIAGNOSIS — E782 Mixed hyperlipidemia: Secondary | ICD-10-CM | POA: Diagnosis not present

## 2022-01-06 DIAGNOSIS — I10 Essential (primary) hypertension: Secondary | ICD-10-CM

## 2022-01-06 DIAGNOSIS — E1169 Type 2 diabetes mellitus with other specified complication: Secondary | ICD-10-CM

## 2022-01-06 LAB — COMPREHENSIVE METABOLIC PANEL
ALT: 14 U/L (ref 0–35)
AST: 24 U/L (ref 0–37)
Albumin: 4 g/dL (ref 3.5–5.2)
Alkaline Phosphatase: 78 U/L (ref 39–117)
BUN: 13 mg/dL (ref 6–23)
CO2: 28 mEq/L (ref 19–32)
Calcium: 9.2 mg/dL (ref 8.4–10.5)
Chloride: 99 mEq/L (ref 96–112)
Creatinine, Ser: 0.71 mg/dL (ref 0.40–1.20)
GFR: 83.51 mL/min (ref >60.00)
Glucose, Bld: 116 mg/dL — ABNORMAL HIGH (ref 70–99)
Potassium: 4.1 mEq/L (ref 3.5–5.1)
Sodium: 138 mEq/L (ref 135–145)
Total Bilirubin: 1 mg/dL (ref 0.2–1.2)
Total Protein: 6.6 g/dL (ref 6.0–8.3)

## 2022-01-06 LAB — MICROALBUMIN / CREATININE URINE RATIO
Creatinine,U: 47.1 mg/dL
Microalb Creat Ratio: 1.5 mg/g (ref 0.0–30.0)
Microalb, Ur: <0.7 mg/dL (ref 0.0–1.9)

## 2022-01-06 LAB — LIPID PANEL
Cholesterol: 128 mg/dL (ref 0–200)
HDL: 55.4 mg/dL (ref >39.00)
LDL Cholesterol: 59 mg/dL (ref 0–99)
NonHDL: 72.37
Total CHOL/HDL Ratio: 2
Triglycerides: 67 mg/dL (ref 0.0–149.0)
VLDL: 13.4 mg/dL (ref 0.0–40.0)

## 2022-01-06 LAB — HEMOGLOBIN A1C: Hgb A1c MFr Bld: 6.9 % — ABNORMAL HIGH (ref 4.6–6.5)

## 2022-01-08 ENCOUNTER — Encounter: Payer: Self-pay | Admitting: Internal Medicine

## 2022-01-09 ENCOUNTER — Ambulatory Visit: Payer: Medicare PPO | Admitting: Internal Medicine

## 2022-01-09 ENCOUNTER — Encounter: Payer: Self-pay | Admitting: Internal Medicine

## 2022-01-09 VITALS — BP 130/62 | HR 81 | Ht 65.0 in | Wt 156.6 lb

## 2022-01-09 DIAGNOSIS — I10 Essential (primary) hypertension: Secondary | ICD-10-CM | POA: Diagnosis not present

## 2022-01-09 DIAGNOSIS — K760 Fatty (change of) liver, not elsewhere classified: Secondary | ICD-10-CM | POA: Diagnosis not present

## 2022-01-09 DIAGNOSIS — E782 Mixed hyperlipidemia: Secondary | ICD-10-CM

## 2022-01-09 DIAGNOSIS — E785 Hyperlipidemia, unspecified: Secondary | ICD-10-CM | POA: Diagnosis not present

## 2022-01-09 DIAGNOSIS — K5900 Constipation, unspecified: Secondary | ICD-10-CM | POA: Diagnosis not present

## 2022-01-09 DIAGNOSIS — E1169 Type 2 diabetes mellitus with other specified complication: Secondary | ICD-10-CM

## 2022-01-09 NOTE — Assessment & Plan Note (Signed)
Last imaging study normal and LFT's normal .  Attributed to normalization of weight   Lab Results  Component Value Date   ALT 14 01/06/2022   AST 24 01/06/2022   ALKPHOS 78 01/06/2022   BILITOT 1.0 01/06/2022

## 2022-01-09 NOTE — Patient Instructions (Addendum)
Your diabetes is under excellent control and your other labs are also exellent!     For the constipation   You should try using miralax and docusate (stool softener) every day to manage constipation . YOU CAN ADD MAGNESIUM CITRATE 250 MG  to this regimen to avoid using sennakot more than every 2-3 days   Make sure you are drinking 60 ounces of water or  more daily

## 2022-01-09 NOTE — Assessment & Plan Note (Signed)
Well controlled on current regimen of losartan 50 mg daily. Renal function stable, no changes today. 

## 2022-01-09 NOTE — Assessment & Plan Note (Addendum)
A1c is now at goal  with addition of Jardiance with the addition of 2.5 mg glipizide at breakfast.  No hypoglycemic events,.  continue statin , LDL at goal   Lab Results  Component Value Date   HGBA1C 6.9 (H) 01/06/2022   Lab Results  Component Value Date   MICROALBUR <0.7 01/06/2022   MICROALBUR <0.7 12/13/2020     Lab Results  Component Value Date   LDLCALC 59 01/06/2022

## 2022-01-09 NOTE — Assessment & Plan Note (Signed)
Chronic, but intolerant of low dose linzess due to recurrent fecal incontinence.  Advised to try miralax/stool softener/with or without magnesium nightly instead of daily sennakot

## 2022-01-09 NOTE — Progress Notes (Signed)
Subjective:  Patient ID: [name], female    DOB: December 20, 1945  Age: 76 y.o. MRN: [id number]  CC: The primary encounter diagnosis was Primary hypertension. Diagnoses of Type 2 diabetes mellitus with other specified complication, without long-term current use of insulin ([place]), Hyperlipidemia associated with type 2 diabetes mellitus ([place]), Constipation, unspecified constipation type, Fatty liver disease, nonalcoholic, and DM type 2 with diabetic mixed hyperlipidemia ([name]) were also pertinent to this visit.   HPI [name] presents for 6 month follow up on type 2 DM Chief Complaint  Patient presents with   Follow-up    6 month follow up on diabetes, hypertension   1) Type 2 DM:   taking jardiance 25 mg and glipizide 25 mg with breakfast.  reviewed fastings and post prandials,  all excellent .  Walking daily after breakfast   2) Constipation: abdominal distension is aggravating her.  Constipation was worse  during her  vacations  bc she withholds her laxative to avoid "accidents" .  Discussed the need to stop using sennakot daily.  Can't use the lowest dose o Linzess due to fecal incontinence   3) HTN:  Patient is taking her losartan  as prescribed and notes no adverse effects.  Home BP readings have been done about once per week and are  generally < 130/80 .  She is avoiding added salt in her diet and walking regularly about 3 times per week for exercise  .    Outpatient Medications Prior to Visit  Medication Sig Dispense Refill   ACCU-CHEK AVIVA PLUS test strip USE TO TEST TWICE A DAY 100 strip 2   atorvastatin (LIPITOR) 40 MG tablet TAKE 1 TABLET BY MOUTH EVERY DAY 90 tablet 3   blood glucose meter kit and supplies KIT Dispense based on patient and insurance preference. Use up to four times daily as directed. (FOR ICD-9 250.00, 250.01). 1 each 0   calcium-vitamin D (OSCAL WITH D) 250-125 MG-UNIT per tablet Take 2 tablets by mouth daily.     COLLAGEN PO daily.     famotidine (PEPCID)  20 MG tablet TAKE 1 TABLET (20 MG TOTAL) BY MOUTH DAILY. 30 MINUTES BEFORE DINNER 90 tablet 1   folic acid (FOLVITE) 481 MCG tablet Take 2,400 mcg by mouth daily.     glipiZIDE (GLUCOTROL) 5 MG tablet TAKE 0.5 TABLETS (2.5 MG TOTAL) BY MOUTH DAILY WITH BREAKFAST. 45 tablet 1   JARDIANCE 25 MG TABS tablet TAKE 1 TABLET BY MOUTH DAILY BEFORE BREAKFAST. 90 tablet 3   lipase/protease/amylase (CREON) 36000 UNITS CPEP capsule Take 2 capsules with the first bite of each meal and 1 capsule with the first bite of each snack 240 capsule 5   losartan (COZAAR) 50 MG tablet TAKE 1 TABLET BY MOUTH EVERY DAY 90 tablet 3   metFORMIN (GLUCOPHAGE) 500 MG tablet TAKE 2 TABLETS (1,000 MG TOTAL) BY MOUTH 2 (TWO) TIMES DAILY WITH A MEAL. 360 tablet 1   Multiple Vitamin (MULTIVITAMIN) tablet Take 1 tablet by mouth daily.     omeprazole (PRILOSEC) 40 MG capsule Take 40 mg by mouth daily. 30 capsule 1   pantoprazole (PROTONIX) 40 MG tablet TAKE 1 TABLET BY MOUTH EVERY DAY 90 tablet 1   Probiotic Product (PROBIOTIC-10 PO) Take 1 capsule by mouth daily.     linaclotide (LINZESS) 72 MCG capsule Take 1 capsule (72 mcg total) by mouth daily before breakfast. 90 capsule 1   No facility-administered medications prior to visit.    Review  of Systems;  Patient denies headache, fevers, malaise, unintentional weight loss, skin rash, eye pain, sinus congestion and sinus pain, sore throat, dysphagia,  hemoptysis , cough, dyspnea, wheezing, chest pain, palpitations, orthopnea, edema, abdominal pain, nausea, melena, diarrhea, constipation, flank pain, dysuria, hematuria, urinary  Frequency, nocturia, numbness, tingling, seizures,  Focal weakness, Loss of consciousness,  Tremor, insomnia, depression, anxiety, and suicidal ideation.      Objective:  BP 130/62 (BP Location: Left Arm, Patient Position: Sitting, Cuff Size: Normal)   Pulse 81   Ht _0  (1.651 m)   Wt 156 lb 9.6 oz (71 kg)   SpO2 99%   BMI 26.06 kg/m   BP Readings  from Last 3 Encounters:  01/09/22 130/62  10/22/21 126/78  07/22/21 (!) 147/82    Wt Readings from Last 3 Encounters:  01/09/22 156 lb 9.6 oz (71 kg)  10/22/21 156 lb (70.8 kg)  07/22/21 154 lb 2 oz (69.9 kg)    General appearance: alert, cooperative and appears stated age Ears: normal TM's and external ear canals both ears Throat: lips, mucosa, and tongue normal; teeth and gums normal Neck: no adenopathy, no carotid bruit, supple, symmetrical, trachea midline and thyroid not enlarged, symmetric, no tenderness/mass/nodules Back: symmetric, no curvature. ROM normal. No CVA tenderness. Lungs: clear to auscultation bilaterally Heart: regular rate and rhythm, S1, S2 normal, no murmur, click, rub or gallop Abdomen: soft, non-tender; bowel sounds normal; no masses,  no organomegaly Pulses: 2+ and symmetric Skin: Skin color, texture, turgor normal. No rashes or lesions Lymph nodes: Cervical, supraclavicular, and axillary nodes normal.  Lab Results  Component Value Date   HGBA1C 6.9 (H) 01/06/2022   HGBA1C 7.3 (H) 07/01/2021   HGBA1C 7.6 (H) 03/17/2021    Lab Results  Component Value Date   CREATININE 0.71 01/06/2022   CREATININE 0.77 07/01/2021   CREATININE 0.81 03/17/2021    Lab Results  Component Value Date   WBC 3.8 (L) 03/17/2021   HGB 14.2 03/17/2021   HCT 43.8 03/17/2021   PLT 237.0 03/17/2021   GLUCOSE 116 (H) 01/06/2022   CHOL 128 01/06/2022   TRIG 67.0 01/06/2022   HDL 55.40 01/06/2022   LDLDIRECT 81 07/29/2017   LDLCALC 59 01/06/2022   ALT 14 01/06/2022   AST 24 01/06/2022   NA 138 01/06/2022   K 4.1 01/06/2022   CL 99 01/06/2022   CREATININE 0.71 01/06/2022   BUN 13 01/06/2022   CO2 28 01/06/2022   TSH 1.37 12/13/2020   HGBA1C 6.9 (H) 01/06/2022   MICROALBUR <0.7 01/06/2022    No results found.  Assessment & Plan:   Problem List Items Addressed This Visit     Constipation    Chronic, but intolerant of low dose linzess due to recurrent fecal  incontinence.  Advised to try miralax/stool softener/with or without magnesium nightly instead of daily sennakot      DM type 2 with diabetic mixed hyperlipidemia ([facility])    A1c is now at goal  with addition of Jardiance with the addition of 2.5 mg glipizide at breakfast.  No hypoglycemic events,.  continue statin , LDL at goal   Lab Results  Component Value Date   HGBA1C 6.9 (H) 01/06/2022   Lab Results  Component Value Date   MICROALBUR <0.7 01/06/2022   MICROALBUR <0.7 12/13/2020     Lab Results  Component Value Date   LDLCALC 59 01/06/2022        Fatty liver disease, nonalcoholic    Last imaging study  normal and LFT's normal .  Attributed to normalization of weight   Lab Results  Component Value Date   ALT 14 01/06/2022   AST 24 01/06/2022   ALKPHOS 78 01/06/2022   BILITOT 1.0 01/06/2022         Hyperlipidemia associated with type 2 diabetes mellitus ([place])   Relevant Orders   Lipid panel   Direct LDL   Hypertension - Primary    Well controlled on current regimen of losartan 50 mg daily . Renal function stable, no changes today.      Relevant Orders   Comprehensive metabolic panel   Follow-up: No follow-ups on file.   Crecencio Mc, MD

## 2022-01-20 NOTE — Telephone Encounter (Signed)
Pt is calling to check on the status of her shoe order and orthotics.   Please advise

## 2022-01-23 NOTE — Telephone Encounter (Signed)
LVM FOR PT TO CALL BACK TO DISCUSS WHERE WE ARE IN THE DM SHOE PROCESS. UNFORTUNATELY THE PAPERWORK EXPIRED. NEW FORMS WERE SENT OUT TO DR TULLO'S OFFICE BUT THE FAX WAS NOT RECEIVED, HOWEVER UPON REVIEWING THE CHART I SEE THAT IT WAS SIGNED AND SCANNED. I HAVE MESSAGED THE PROVIDER ABOUT THIS MISHAP AND I WILL BE FAXING OVER THE FORM AGAIN.

## 2022-02-08 ENCOUNTER — Other Ambulatory Visit: Payer: Self-pay | Admitting: Internal Medicine

## 2022-03-05 DIAGNOSIS — Z03818 Encounter for observation for suspected exposure to other biological agents ruled out: Secondary | ICD-10-CM | POA: Diagnosis not present

## 2022-03-05 DIAGNOSIS — H6993 Unspecified Eustachian tube disorder, bilateral: Secondary | ICD-10-CM | POA: Diagnosis not present

## 2022-03-05 DIAGNOSIS — J069 Acute upper respiratory infection, unspecified: Secondary | ICD-10-CM | POA: Diagnosis not present

## 2022-03-17 DIAGNOSIS — Z03818 Encounter for observation for suspected exposure to other biological agents ruled out: Secondary | ICD-10-CM | POA: Diagnosis not present

## 2022-03-17 DIAGNOSIS — J019 Acute sinusitis, unspecified: Secondary | ICD-10-CM | POA: Diagnosis not present

## 2022-03-21 ENCOUNTER — Other Ambulatory Visit: Payer: Self-pay | Admitting: Internal Medicine

## 2022-03-21 DIAGNOSIS — E1169 Type 2 diabetes mellitus with other specified complication: Secondary | ICD-10-CM

## 2022-04-07 ENCOUNTER — Other Ambulatory Visit (HOSPITAL_COMMUNITY)
Admission: RE | Admit: 2022-04-07 | Discharge: 2022-04-07 | Disposition: A | Discharge status: Home / Self Care | Payer: Medicare PPO | Source: Ambulatory Visit | Attending: Obstetrics & Gynecology | Admitting: Obstetrics & Gynecology

## 2022-04-07 ENCOUNTER — Ambulatory Visit (INDEPENDENT_AMBULATORY_CARE_PROVIDER_SITE_OTHER): Payer: Medicare PPO | Admitting: Obstetrics & Gynecology

## 2022-04-07 ENCOUNTER — Encounter: Payer: Self-pay | Admitting: Obstetrics & Gynecology

## 2022-04-07 VITALS — BP 124/74 | HR 75 | Wt 155.0 lb

## 2022-04-07 DIAGNOSIS — Z1151 Encounter for screening for human papillomavirus (HPV): Secondary | ICD-10-CM | POA: Insufficient documentation

## 2022-04-07 DIAGNOSIS — Z01419 Encounter for gynecological examination (general) (routine) without abnormal findings: Secondary | ICD-10-CM

## 2022-04-07 DIAGNOSIS — Z1231 Encounter for screening mammogram for malignant neoplasm of breast: Secondary | ICD-10-CM

## 2022-04-07 NOTE — Progress Notes (Signed)
GYNECOLOGY ANNUAL PREVENTATIVE CARE ENCOUNTER NOTE  History:     [name] is a 76 y.o. [phone] female here for a routine annual gynecologic exam.  Current complaints: none.   Denies abnormal vaginal bleeding, discharge, pelvic pain, problems with intercourse or other gynecologic concerns.    Gynecologic History No LMP recorded. Patient is postmenopausal. Last Pap: 01/14/2021. Result was normal with negative HPV.  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 Persistent HPV since 2014 but negative 16, 18 and 45. 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  Had cryotherapy on 04/2016.   Last Mammogram: 08/19/2021.  Result was normal Last Colonoscopy: 04/30/2021.  Result was benign  Obstetric History OB History  Gravida Para Term Preterm AB Living  _0 0 0 2  SAB IAB Ectopic Multiple Live Births  0 0 0 0 2    # Outcome Date GA Lbr Len/2nd Weight Sex Delivery Anes PTL Lv  2 Term     M Vag-Spont   LIV  1 Term     M Vag-Spont   LIV    Past Medical History:  Diagnosis Date   COVID-19 virus infection 12/10/2020   Positive test December 04 2020    Diabetes mellitus    Gastritis    High cholesterol    Hyperlipidemia    Lipitor trial was a failure   Hypertension    PMR (polymyalgia rheumatica) ([place])    Syncope 01/15/2019    Past Surgical History:  Procedure Laterality Date   BACK SURGERY  1995   CARDIAC CATHETERIZATION  12/16/2005   normal coronaries,  EF 60%   COLONOSCOPY WITH PROPOFOL N/A 04/30/2021   Procedure: COLONOSCOPY WITH PROPOFOL;  Surgeon: Virgel Manifold, MD;  Location: [facility] ENDOSCOPY;  Service: Endoscopy;  Laterality: N/A;  DM   SPINE SURGERY  05/18/1993   SPINE SURGERY     bone graft    TUBAL LIGATION      Current Outpatient Medications on File Prior to Visit  Medication Sig Dispense Refill   ACCU-CHEK AVIVA PLUS test strip USE TO TEST TWICE A DAY 100 strip 2    atorvastatin (LIPITOR) 40 MG tablet TAKE 1 TABLET BY MOUTH EVERY DAY 90 tablet 3   blood glucose meter kit and supplies KIT Dispense based on patient and insurance preference. Use up to four times daily as directed. (FOR ICD-9 250.00, 250.01). 1 each 0   calcium-vitamin D (OSCAL WITH D) 250-125 MG-UNIT per tablet Take 2 tablets by mouth daily.     COLLAGEN PO daily.     famotidine (PEPCID) 20 MG tablet TAKE 1 TABLET (20 MG TOTAL) BY MOUTH DAILY. 30 MINUTES BEFORE DINNER 90 tablet 1   folic acid (FOLVITE) 600 MCG tablet Take 2,400 mcg by mouth daily.     glipiZIDE (GLUCOTROL) 5 MG tablet TAKE 0.5 TABLETS (2.5 MG TOTAL) BY MOUTH DAILY WITH BREAKFAST. 45 tablet 1   JARDIANCE 25 MG TABS tablet TAKE 1 TABLET BY MOUTH DAILY BEFORE BREAKFAST. 90 tablet 3   losartan (COZAAR) 50 MG tablet TAKE 1 TABLET BY MOUTH EVERY DAY 90 tablet 3   metFORMIN (GLUCOPHAGE) 500 MG tablet TAKE 2 TABLETS (1,000 MG TOTAL) BY MOUTH 2 (TWO) TIMES DAILY WITH A MEAL. 360 tablet 1   Multiple Vitamin (MULTIVITAMIN) tablet Take 1 tablet by mouth daily.     omeprazole (PRILOSEC) 40 MG capsule Take 40 mg  by mouth daily. 30 capsule 1   pantoprazole (PROTONIX) 40 MG tablet TAKE 1 TABLET BY MOUTH EVERY DAY 90 tablet 1   Probiotic Product (PROBIOTIC-10 PO) Take 1 capsule by mouth daily.     [DISCONTINUED] ferrous sulfate 325 (65 FE) MG tablet Take 325 mg by mouth daily with breakfast.     No current facility-administered medications on file prior to visit.    Allergies  Allergen Reactions   Amoxicillin Hives   Latex Rash    Social History:  reports that she has never smoked. She has never used smokeless tobacco. She reports that she does not drink alcohol and does not use drugs.  Family History  Problem Relation Age of Onset   Diabetes Mother    Heart disease Father    Stroke Father    Diabetes Father    Diabetes Maternal Grandfather    Diabetes Brother    Diabetes Brother    Breast cancer Neg Hx     The following  portions of the patient's history were reviewed and updated as appropriate: allergies, current medications, past family history, past medical history, past social history, past surgical history and problem list.  Review of Systems Pertinent items noted in HPI and remainder of comprehensive ROS otherwise negative.  Physical Exam:  BP 124/74   Pulse 75   Wt 155 lb (70.3 kg)   BMI 25.79 kg/m  CONSTITUTIONAL: Well-developed, well-nourished female in no acute distress.  HENT:  Normocephalic, atraumatic, External right and left ear normal.  EYES: Conjunctivae and EOM are normal. Pupils are equal, round, and reactive to light. No scleral icterus.  NECK: Normal range of motion, supple, no masses.  Normal thyroid.  SKIN: Skin is warm and dry. No rash noted. Not diaphoretic. No erythema. No pallor. MUSCULOSKELETAL: Normal range of motion. No tenderness.  No cyanosis, clubbing, or edema. NEUROLOGIC: Alert and oriented to person, place, and time. Normal reflexes, muscle tone coordination.  PSYCHIATRIC: Normal mood and affect. Normal behavior. Normal judgment and thought content. CARDIOVASCULAR: Normal heart rate noted, regular rhythm RESPIRATORY: Clear to auscultation bilaterally. Effort and breath sounds normal, no problems with respiration noted. BREASTS: Symmetric in size. No masses, tenderness, skin changes, nipple drainage, or lymphadenopathy bilaterally. Performed in the presence of a chaperone. ABDOMEN: Soft, no distention noted.  No tenderness, rebound or guarding.  PELVIC: Normal appearing external genitalia and urethral meatus; atrophic vaginal mucosa and cervix.  No abnormal vaginal discharge noted.  Pap smear obtained.  Normal uterine size, no other palpable masses, no uterine or adnexal tenderness.  Performed in the presence of a chaperone.   Assessment and Plan:     1. Breast cancer screening by mammogram Mammogram scheduled - MM 3D SCREEN BREAST BILATERAL; Future  2. Well woman exam  with routine gynecological exam - Cytology - PAP Will follow up results of pap smear and manage accordingly. If normal, can lengthen screening interval.  Colon cancer screening is up to date. Routine preventative health maintenance measures emphasized. Please refer to After Visit Summary for other counseling recommendations.      Verita Schneiders, MD, [place] for [employer], [place]

## 2022-04-13 LAB — CYTOLOGY - PAP
Comment: NEGATIVE
Diagnosis: UNDETERMINED — AB
High risk HPV: NEGATIVE

## 2022-04-22 ENCOUNTER — Other Ambulatory Visit: Payer: Self-pay | Admitting: Internal Medicine

## 2022-04-22 DIAGNOSIS — E1169 Type 2 diabetes mellitus with other specified complication: Secondary | ICD-10-CM

## 2022-04-24 ENCOUNTER — Telehealth: Payer: Self-pay | Admitting: Internal Medicine

## 2022-04-24 NOTE — Telephone Encounter (Signed)
Copied from [facility] [id number]. Topic: Medicare AWV >> Apr 24, 2022 11:47 AM Devoria Glassing wrote: Reason for CRM: Left message for patient to schedule Annual Wellness Visit.  Please schedule with Nurse Health Advisor Madelyn Brunner, LPN at [facility]. This appt can be telephone or office visit.  Please call [phone] ask for [facility]

## 2022-04-28 ENCOUNTER — Other Ambulatory Visit: Payer: Self-pay | Admitting: Internal Medicine

## 2022-05-05 DIAGNOSIS — M19011 Primary osteoarthritis, right shoulder: Secondary | ICD-10-CM | POA: Diagnosis not present

## 2022-05-05 DIAGNOSIS — M7989 Other specified soft tissue disorders: Secondary | ICD-10-CM | POA: Diagnosis not present

## 2022-05-05 DIAGNOSIS — M0609 Rheumatoid arthritis without rheumatoid factor, multiple sites: Secondary | ICD-10-CM | POA: Diagnosis not present

## 2022-05-05 DIAGNOSIS — G8929 Other chronic pain: Secondary | ICD-10-CM | POA: Diagnosis not present

## 2022-05-05 DIAGNOSIS — M25511 Pain in right shoulder: Secondary | ICD-10-CM | POA: Diagnosis not present

## 2022-05-05 DIAGNOSIS — M25411 Effusion, right shoulder: Secondary | ICD-10-CM | POA: Diagnosis not present

## 2022-05-20 DIAGNOSIS — Z85828 Personal history of other malignant neoplasm of skin: Secondary | ICD-10-CM | POA: Diagnosis not present

## 2022-05-20 DIAGNOSIS — L821 Other seborrheic keratosis: Secondary | ICD-10-CM | POA: Diagnosis not present

## 2022-05-20 DIAGNOSIS — D2262 Melanocytic nevi of left upper limb, including shoulder: Secondary | ICD-10-CM | POA: Diagnosis not present

## 2022-05-20 DIAGNOSIS — L84 Corns and callosities: Secondary | ICD-10-CM | POA: Diagnosis not present

## 2022-05-20 DIAGNOSIS — D225 Melanocytic nevi of trunk: Secondary | ICD-10-CM | POA: Diagnosis not present

## 2022-05-20 DIAGNOSIS — B078 Other viral warts: Secondary | ICD-10-CM | POA: Diagnosis not present

## 2022-06-07 ENCOUNTER — Other Ambulatory Visit: Payer: Self-pay | Admitting: Internal Medicine

## 2022-06-07 DIAGNOSIS — E1169 Type 2 diabetes mellitus with other specified complication: Secondary | ICD-10-CM

## 2022-06-07 DIAGNOSIS — U071 COVID-19: Secondary | ICD-10-CM | POA: Diagnosis not present

## 2022-06-07 DIAGNOSIS — Z03818 Encounter for observation for suspected exposure to other biological agents ruled out: Secondary | ICD-10-CM | POA: Diagnosis not present

## 2022-06-24 ENCOUNTER — Other Ambulatory Visit: Payer: Self-pay | Admitting: Internal Medicine

## 2022-06-24 DIAGNOSIS — E782 Mixed hyperlipidemia: Secondary | ICD-10-CM

## 2022-06-26 ENCOUNTER — Ambulatory Visit (INDEPENDENT_AMBULATORY_CARE_PROVIDER_SITE_OTHER): Payer: Medicare PPO

## 2022-06-26 VITALS — Ht 65.0 in | Wt 155.0 lb

## 2022-06-26 DIAGNOSIS — Z0000 Encounter for general adult medical examination without abnormal findings: Secondary | ICD-10-CM | POA: Diagnosis not present

## 2022-06-26 NOTE — Progress Notes (Addendum)
Subjective:   [name] is a 77 y.o. female who presents for Medicare Annual (Subsequent) preventive examination.  Review of Systems    No ROS.  Medicare Wellness Virtual Visit.  Visual/audio telehealth visit, UTA vital signs.   See social history for additional risk factors.   Cardiac Risk Factors include: advanced age (>54men, >52 women);hypertension     Objective:    Today's Vitals   06/26/22 1335  Weight: 155 lb (70.3 kg)  Height: 5\' 5"  (1.651 m)   Body mass index is 25.79 kg/m.     06/26/2022    1:37 PM 05/02/2021   12:03 PM 04/30/2021    9:31 AM 05/01/2020   11:19 AM 05/01/2019   11:27 AM 04/28/2018   11:31 AM 04/13/2017    2:40 PM  Advanced Directives  Does Patient Have a Medical Advance Directive? Yes Yes Yes Yes No No No  Type of Estate agent of [place];Living will Healthcare Power of [place];Living will Healthcare Power of [place];Living will Healthcare Power of [place];Living will     Does patient want to make changes to medical advance directive? No - Patient declined No - Patient declined  No - Patient declined     Copy of Healthcare Power of Attorney in Chart? Yes - validated most recent copy scanned in chart (See row information) Yes - validated most recent copy scanned in chart (See row information)  No - copy requested     Would patient like information on creating a medical advance directive?     No - Guardian declined No - Patient declined No - Patient declined    Current Medications (verified) Outpatient Encounter Medications as of 06/26/2022  Medication Sig   ACCU-CHEK AVIVA PLUS test strip USE TO TEST TWICE A DAY   atorvastatin (LIPITOR) 40 MG tablet TAKE 1 TABLET BY MOUTH EVERY DAY   blood glucose meter kit and supplies KIT Dispense based on patient and insurance preference. Use up to four times daily as directed. (FOR ICD-9 250.00, 250.01).   calcium-vitamin D (OSCAL WITH D) 250-125 MG-UNIT per tablet Take 2 tablets by  mouth daily.   COLLAGEN PO daily.   famotidine (PEPCID) 20 MG tablet TAKE 1 TABLET (20 MG TOTAL) BY MOUTH DAILY. 30 MINUTES BEFORE DINNER   folic acid (FOLVITE) 800 MCG tablet Take 2,400 mcg by mouth daily.   glipiZIDE (GLUCOTROL) 5 MG tablet TAKE 1/2 TABLET BY MOUTH DAILY WITH BREAKFAST   JARDIANCE 25 MG TABS tablet TAKE 1 TABLET BY MOUTH DAILY BEFORE BREAKFAST.   losartan (COZAAR) 50 MG tablet TAKE 1 TABLET BY MOUTH EVERY DAY   metFORMIN (GLUCOPHAGE) 500 MG tablet TAKE 2 TABLETS (1,000 MG TOTAL) BY MOUTH 2 (TWO) TIMES DAILY WITH A MEAL.   Multiple Vitamin (MULTIVITAMIN) tablet Take 1 tablet by mouth daily.   omeprazole (PRILOSEC) 40 MG capsule Take 40 mg by mouth daily.   pantoprazole (PROTONIX) 40 MG tablet TAKE 1 TABLET BY MOUTH EVERY DAY   Probiotic Product (PROBIOTIC-10 PO) Take 1 capsule by mouth daily.   [DISCONTINUED] ferrous sulfate 325 (65 FE) MG tablet Take 325 mg by mouth daily with breakfast.   No facility-administered encounter medications on file as of 06/26/2022.    Allergies (verified) Amoxicillin and Latex   History: Past Medical History:  Diagnosis Date   COVID-19 virus infection 12/10/2020   Positive test December 04 2020    Diabetes mellitus    Gastritis    High cholesterol    Hyperlipidemia  Lipitor trial was a failure   Hypertension    PMR (polymyalgia rheumatica) (HCC)    Syncope 01/15/2019   Past Surgical History:  Procedure Laterality Date   BACK SURGERY  1995   CARDIAC CATHETERIZATION  12/16/2005   normal coronaries,  EF 60%   COLONOSCOPY WITH PROPOFOL N/A 04/30/2021   Procedure: COLONOSCOPY WITH PROPOFOL;  Surgeon: Pasty Spillers, MD;  Location: [facility] END[facility]COPY;  Service: Endoscopy;  Laterality: N/A;  DM   SPINE SURGERY  05/18/1993   SPINE SURGERY     bone graft    TUBAL LIGATION     Family History  Problem Relation Age of Onset   Diabetes Mother    Heart disease Father    Stroke Father    Diabetes Father    Diabetes Maternal  Grandfather    Diabetes Brother    Diabetes Brother    Breast cancer Neg Hx    Social History   Socioeconomic History   Marital status: Married    Spouse name: Not on file   Number of children: Not on file   Years of education: Not on file   Highest education level: Not on file  Occupational History   Occupation: Teacher, adult education: [employer]    Comment: Retired 2014  Tobacco Use   Smoking status: Never   Smokeless tobacco: Never  Vaping Use   Vaping Use: Never used  Substance and Sexual Activity   Alcohol use: No   Drug use: No   Sexual activity: Yes    Partners: Male    Birth control/protection: None  Other Topics Concern   Not on file  Social History Narrative   Not on file   Social Determinants of Health   Financial Resource Strain: Low Risk  (06/26/2022)   Overall Financial Resource Strain (CARDIA)    Difficulty of Paying Living Expenses: Not hard at all  Food Insecurity: No Food Insecurity (06/26/2022)   Hunger Vital Sign    Worried About Running Out of Food in the Last Year: Never true    Ran Out of Food in the Last Year: Never true  Transportation Needs: No Transportation Needs (06/26/2022)   PRAPARE - Administrator, Civil Service (Medical): No    Lack of Transportation (Non-Medical): No  Physical Activity: Sufficiently Active (06/26/2022)   Exercise Vital Sign    Days of Exercise per Week: 7 days    Minutes of Exercise per Session: 60 min  Stress: No Stress Concern Present (06/26/2022)   [employer] of Occupational Health - Occupational Stress Questionnaire    Feeling of Stress : Only a little  Social Connections: Unknown (06/26/2022)   Social Connection and Isolation Panel [NHANES]    Frequency of Communication with Friends and Family: More than three times a week    Frequency of Social Gatherings with Friends and Family: Twice a week    Attends Religious Services: Not on Marketing executive or Organizations: Yes    Attends Museum/gallery exhibitions officer: More than 4 times per year    Marital Status: Married    Tobacco Counseling Counseling given: Not Answered   Clinical Intake:  Pre-visit preparation completed: Yes        Diabetes:  (Followed by pcp)  How often do you need to have someone help you when you read instructions, pamphlets, or other written materials from your doctor or pharmacy?: 1 - Never    Interpreter Needed?: No  Activities of Daily Living    06/26/2022    1:38 PM 06/22/2022   11:08 AM  In your present state of health, do you have any difficulty performing the following activities:  Hearing? 0 0  Vision? 0 0  Difficulty concentrating or making decisions? 0 0  Walking or climbing stairs? 0 0  Dressing or bathing? 0 0  Doing errands, shopping? 0 0  Preparing Food and eating ? N N  Using the Toilet? N N  In the past six months, have you accidently leaked urine? N N  Do you have problems with loss of bowel control? N N  Managing your Medications? N N  Managing your Finances? N N  Housekeeping or managing your Housekeeping? N N    Patient Care Team: Sherlene Shams, MD as PCP - General (Internal Medicine) Kandyce Rud., MD (Rheumatology) Tereso Newcomer, MD as Consulting Physician (Obstetrics and Gynecology)  Indicate any recent Medical Services you may have received from other than [facility] providers in the past year (date may be approximate).     Assessment:   This is a routine wellness examination for [name].  I connected with  [name] on 06/26/22 by a audio enabled telemedicine application and verified that I am speaking with the correct person using two identifiers.  Patient Location: Home  Provider Location: Office/Clinic  I discussed the limitations of evaluation and management by telemedicine. The patient expressed understanding and agreed to proceed.   Hearing/Vision screen Hearing Screening - Comments:: Patient is able to hear conversational  tones without difficulty.  No issues reported.   Vision Screening - Comments:: Followed by [facility]  No retinopathy  They have regular follow up with the ophthalmologist   Dietary issues and exercise activities discussed: Current Exercise Habits: Home exercise routine, Type of exercise: walking, Time (Minutes): 60, Frequency (Times/Week): 7, Weekly Exercise (Minutes/Week): 420, Intensity: Mild   Goals Addressed             This Visit's Progress    Maintain Healthy Lifestyle   On track    Stay active Healthy diet       Depression Screen    06/26/2022    1:40 PM 01/09/2022    8:30 AM 07/09/2021    2:11 PM 05/02/2021   11:37 AM 03/24/2021   11:52 AM 12/10/2020    1:59 PM 08/26/2020    4:38 PM  PHQ 2/9 Scores  PHQ - 2 Score 0 0 0 0 0 1 0    Fall Risk    06/26/2022    1:37 PM 06/22/2022   11:08 AM 01/09/2022    8:30 AM 07/09/2021    2:11 PM 05/02/2021   12:04 PM  Fall Risk   Falls in the past year? 0 0 0 0 0  Number falls in past yr: 0    0  Risk for fall due to :   No Fall Risks No Fall Risks   Follow up Falls evaluation completed;Falls prevention discussed  Falls evaluation completed Falls evaluation completed Falls evaluation completed    FALL RISK PREVENTION PERTAINING TO THE HOME: Home free of loose throw rugs in walkways, pet beds, electrical cords, etc? Yes  Adequate lighting in your home to reduce risk of falls? Yes   ASSISTIVE DEVICES UTILIZED TO PREVENT FALLS: Life alert? No  Use of a cane, walker or w/c? No  Grab bars in the bathroom? No  Shower chair or bench in shower? No  Comfort chair height toilet? Yes   TIMED UP AND GO: Was the test performed? No .   Cognitive Function:    04/13/2017    2:56 PM 01/01/2016   12:14 PM 09/12/2014   10:41 AM  MMSE - Mini Mental State Exam  Orientation to time 5 5 5   Orientation to Place 5 5 5   Registration 3 3 3   Attention/ Calculation 5 5 5   Recall 2 3 3   Language- name 2 objects 2 2 2   Language-  repeat 1 1 1   Language- follow 3 step command 3 3 3   Language- read & follow direction 1 1 1   Write a sentence 1 1 1   Copy design 1 1 1   Total score 29 30 30         06/26/2022    1:39 PM 05/02/2021   12:05 PM 05/01/2019   11:24 AM 04/28/2018   11:34 AM  6CIT Screen  What Year? 0 points 0 points 0 points 0 points  What month? 0 points 0 points 0 points 0 points  What time? 0 points 0 points 0 points 0 points  Count back from 20 0 points 0 points 0 points 0 points  Months in reverse 0 points 0 points 0 points 0 points  Repeat phrase 0 points 0 points 0 points   Total Score 0 points 0 points 0 points     Immunizations Immunization History  Administered Date(s) Administered   Hep A / Hep B 01/01/2016, 02/04/2016, 07/03/2016   Influenza Split 03/03/2012, 01/26/2013, 02/25/2015   Influenza, High Dose Seasonal PF 02/20/2016, 01/31/2018, 01/08/2019, 02/27/2021   Influenza,inj,Quad PF,6+ Mos 02/13/2014   Influenza-Unspecified 02/19/2017, 02/13/2020   PFIZER Comirnaty(Gray Top)Covid-19 Tri-Sucrose Vaccine 08/19/2020   PFIZER(Purple Top)SARS-COV-2 Vaccination 06/09/2019, 07/01/2019, 02/13/2020   Pfizer Covid-19 Vaccine Bivalent Booster 49yrs & up 02/27/2021, 10/01/2021   Pneumococcal Conjugate-13 11/08/2013, 01/08/2019   Pneumococcal Polysaccharide-23 03/03/2012   Tdap 08/08/2013   Zoster Recombinat (Shingrix) 03/02/2018, 06/13/2018   Zoster, Live 04/20/2008   Screening Tests Health Maintenance  Topic Date Due   COVID-19 Vaccine (7 - 2023-24 season) 07/12/2022 (Originally 01/16/2022)   HEMOGLOBIN A1C  07/09/2022   OPHTHALMOLOGY EXAM  08/12/2022   MAMMOGRAM  08/20/2022   Diabetic kidney evaluation - eGFR measurement  01/07/2023   Diabetic kidney evaluation - Urine ACR  01/07/2023   FOOT EXAM  01/10/2023   Medicare Annual Wellness (AWV)  06/27/2023   DTaP/Tdap/Td (2 - Td or Tdap) 08/09/2023   COLONOSCOPY (Pts 45-42yrs Insurance coverage will need to be confirmed)  05/01/2031    Pneumonia Vaccine 21+ Years old  Completed   INFLUENZA VACCINE  Completed   DEXA SCAN  Completed   Hepatitis C Screening  Completed   Zoster Vaccines- Shingrix  Completed   HPV VACCINES  Aged Out   Health Maintenance There are no preventive care reminders to display for this patient.  Lung Cancer Screening: (Low Dose CT Chest recommended if Age 32-80 years, 30 pack-year currently smoking OR have quit w/in 15years.) does not qualify.   Hepatitis C Screening: Completed 07/2017.  Vision Screening: Recommended annual ophthalmology exams for early detection of glaucoma and other disorders of the eye.  Dental Screening: Recommended annual dental exams for proper oral hygiene.  Community Resource Referral / Chronic Care Management: CRR required this visit?  No   CCM required this visit?  No      Plan:     I have personally reviewed and noted the following in the patient's chart:  Medical and social history Use of alcohol, tobacco or illicit drugs  Current medications and supplements including opioid prescriptions. Patient is not currently taking opioid prescriptions. Functional ability and status Nutritional status Physical activity Advanced directives List of other physicians Hospitalizations, surgeries, and ER visits in previous 12 months Vitals Screenings to include cognitive, depression, and falls Referrals and appointments  In addition, I have reviewed and discussed with patient certain preventive protocols, quality metrics, and best practice recommendations. A written personalized care plan for preventive services as well as general preventive health recommendations were provided to patient.     Burgess Sheriff L Motley, LPN   05/23/1094       I have reviewed the above information and agree with above.   Duncan Dull, MD

## 2022-06-26 NOTE — Patient Instructions (Addendum)
[name] , Thank you for taking time to come for your Medicare Wellness Visit. I appreciate your ongoing commitment to your health goals. Please review the following plan we discussed and let me know if I can assist you in the future.   These are the goals we discussed:  Goals Addressed             This Visit's Progress    Maintain Healthy Lifestyle   On track    Stay active Healthy diet         This is a list of the screening recommended for you and due dates:  Health Maintenance  Topic Date Due   COVID-19 Vaccine (7 - 2023-24 season) 07/12/2022*   Hemoglobin A1C  07/09/2022   Eye exam for diabetics  08/12/2022   Mammogram  08/20/2022   Yearly kidney function blood test for diabetes  01/07/2023   Yearly kidney health urinalysis for diabetes  01/07/2023   Complete foot exam   01/10/2023   Medicare Annual Wellness Visit  06/27/2023   DTaP/Tdap/Td vaccine (2 - Td or Tdap) 08/09/2023   Colon Cancer Screening  05/01/2031   Pneumonia Vaccine  Completed   Flu Shot  Completed   DEXA scan (bone density measurement)  Completed   Hepatitis C Screening: USPSTF Recommendation to screen - Ages 79-79 yo.  Completed   Zoster (Shingles) Vaccine  Completed   HPV Vaccine  Aged Out  *Topic was postponed. The date shown is not the original due date.   Conditions/risks identified: none new,  Next appointment: Follow up in one year for your annual wellness visit.   Preventive Care 47 Years and Older, Female Preventive care refers to lifestyle choices and visits with your health care provider that can promote health and wellness. What does preventive care include? A yearly physical exam. This is also called an annual well check. Dental exams once or twice a year. Routine eye exams. Ask your health care provider how often you should have your eyes checked. Personal lifestyle choices, including: Daily care of your teeth and gums. Regular physical activity. Eating a healthy  diet. Avoiding tobacco and drug use. Limiting alcohol use. Practicing safe sex. Taking low-dose aspirin every day. Taking vitamin and mineral supplements as recommended by your health care provider. What happens during an annual well check? The services and screenings done by your health care provider during your annual well check will depend on your age, overall health, lifestyle risk factors, and family history of disease. Counseling  Your health care provider may ask you questions about your: Alcohol use. Tobacco use. Drug use. Emotional well-being. Home and relationship well-being. Sexual activity. Eating habits. History of falls. Memory and ability to understand (cognition). Work and work Statistician. Reproductive health. Screening  You may have the following tests or measurements: Height, weight, and BMI. Blood pressure. Lipid and cholesterol levels. These may be checked every 5 years, or more frequently if you are over 53 years old. Skin check. Lung cancer screening. You may have this screening every year starting at age 48 if you have a 30-pack-year history of smoking and currently smoke or have quit within the past 15 years. Fecal occult blood test (FOBT) of the stool. You may have this test every year starting at age 72. Flexible sigmoidoscopy or colonoscopy. You may have a sigmoidoscopy every 5 years or a colonoscopy every 10 years starting at age 70. Hepatitis C blood test. Hepatitis B blood test. Sexually transmitted disease (STD) testing. Diabetes  screening. This is done by checking your blood sugar (glucose) after you have not eaten for a while (fasting). You may have this done every 1-3 years. Bone density scan. This is done to screen for osteoporosis. You may have this done starting at age 43. Mammogram. This may be done every 1-2 years. Talk to your health care provider about how often you should have regular mammograms. Talk with your health care provider about  your test results, treatment options, and if necessary, the need for more tests. Vaccines  Your health care provider may recommend certain vaccines, such as: Influenza vaccine. This is recommended every year. Tetanus, diphtheria, and acellular pertussis (Tdap, Td) vaccine. You may need a Td booster every 10 years. Zoster vaccine. You may need this after age 13. Pneumococcal 13-valent conjugate (PCV13) vaccine. One dose is recommended after age 18. Pneumococcal polysaccharide (PPSV23) vaccine. One dose is recommended after age 61. Talk to your health care provider about which screenings and vaccines you need and how often you need them. This information is not intended to replace advice given to you by your health care provider. Make sure you discuss any questions you have with your health care provider. Document Released: 05/31/2015 Document Revised: 01/22/2016 Document Reviewed: 03/05/2015 Elsevier Interactive Patient Education  2017 Major Prevention in the Home Falls can cause injuries. They can happen to people of all ages. There are many things you can do to make your home safe and to help prevent falls. What can I do on the outside of my home? Regularly fix the edges of walkways and driveways and fix any cracks. Remove anything that might make you trip as you walk through a door, such as a raised step or threshold. Trim any bushes or trees on the path to your home. Use bright outdoor lighting. Clear any walking paths of anything that might make someone trip, such as rocks or tools. Regularly check to see if handrails are loose or broken. Make sure that both sides of any steps have handrails. Any raised decks and porches should have guardrails on the edges. Have any leaves, snow, or ice cleared regularly. Use sand or salt on walking paths during winter. Clean up any spills in your garage right away. This includes oil or grease spills. What can I do in the bathroom? Use  night lights. Install grab bars by the toilet and in the tub and shower. Do not use towel bars as grab bars. Use non-skid mats or decals in the tub or shower. If you need to sit down in the shower, use a plastic, non-slip stool. Keep the floor dry. Clean up any water that spills on the floor as soon as it happens. Remove soap buildup in the tub or shower regularly. Attach bath mats securely with double-sided non-slip rug tape. Do not have throw rugs and other things on the floor that can make you trip. What can I do in the bedroom? Use night lights. Make sure that you have a light by your bed that is easy to reach. Do not use any sheets or blankets that are too big for your bed. They should not hang down onto the floor. Have a firm chair that has side arms. You can use this for support while you get dressed. Do not have throw rugs and other things on the floor that can make you trip. What can I do in the kitchen? Clean up any spills right away. Avoid walking on wet floors. Keep  items that you use a lot in easy-to-reach places. If you need to reach something above you, use a strong step stool that has a grab bar. Keep electrical cords out of the way. Do not use floor polish or wax that makes floors slippery. If you must use wax, use non-skid floor wax. Do not have throw rugs and other things on the floor that can make you trip. What can I do with my stairs? Do not leave any items on the stairs. Make sure that there are handrails on both sides of the stairs and use them. Fix handrails that are broken or loose. Make sure that handrails are as long as the stairways. Check any carpeting to make sure that it is firmly attached to the stairs. Fix any carpet that is loose or worn. Avoid having throw rugs at the top or bottom of the stairs. If you do have throw rugs, attach them to the floor with carpet tape. Make sure that you have a light switch at the top of the stairs and the bottom of the  stairs. If you do not have them, ask someone to add them for you. What else can I do to help prevent falls? Wear shoes that: Do not have high heels. Have rubber bottoms. Are comfortable and fit you well. Are closed at the toe. Do not wear sandals. If you use a stepladder: Make sure that it is fully opened. Do not climb a closed stepladder. Make sure that both sides of the stepladder are locked into place. Ask someone to hold it for you, if possible. Clearly mark and make sure that you can see: Any grab bars or handrails. First and last steps. Where the edge of each step is. Use tools that help you move around (mobility aids) if they are needed. These include: Canes. Walkers. Scooters. Crutches. Turn on the lights when you go into a dark area. Replace any light bulbs as soon as they burn out. Set up your furniture so you have a clear path. Avoid moving your furniture around. If any of your floors are uneven, fix them. If there are any pets around you, be aware of where they are. Review your medicines with your doctor. Some medicines can make you feel dizzy. This can increase your chance of falling. Ask your doctor what other things that you can do to help prevent falls. This information is not intended to replace advice given to you by your health care provider. Make sure you discuss any questions you have with your health care provider. Document Released: 02/28/2009 Document Revised: 10/10/2015 Document Reviewed: 06/08/2014 Elsevier Interactive Patient Education  2017 [employer].

## 2022-07-09 ENCOUNTER — Other Ambulatory Visit (INDEPENDENT_AMBULATORY_CARE_PROVIDER_SITE_OTHER): Payer: Medicare PPO

## 2022-07-09 DIAGNOSIS — E785 Hyperlipidemia, unspecified: Secondary | ICD-10-CM | POA: Diagnosis not present

## 2022-07-09 DIAGNOSIS — E1169 Type 2 diabetes mellitus with other specified complication: Secondary | ICD-10-CM

## 2022-07-09 DIAGNOSIS — I10 Essential (primary) hypertension: Secondary | ICD-10-CM | POA: Diagnosis not present

## 2022-07-09 LAB — COMPREHENSIVE METABOLIC PANEL
ALT: 11 U/L (ref 0–35)
AST: 23 U/L (ref 0–37)
Albumin: 4.3 g/dL (ref 3.5–5.2)
Alkaline Phosphatase: 75 U/L (ref 39–117)
BUN: 17 mg/dL (ref 6–23)
CO2: 29 mEq/L (ref 19–32)
Calcium: 9.6 mg/dL (ref 8.4–10.5)
Chloride: 101 mEq/L (ref 96–112)
Creatinine, Ser: 0.7 mg/dL (ref 0.40–1.20)
GFR: 84.64 mL/min (ref >60.00)
Glucose, Bld: 114 mg/dL — ABNORMAL HIGH (ref 70–99)
Potassium: 4.1 mEq/L (ref 3.5–5.1)
Sodium: 138 mEq/L (ref 135–145)
Total Bilirubin: 0.6 mg/dL (ref 0.2–1.2)
Total Protein: 6.9 g/dL (ref 6.0–8.3)

## 2022-07-09 LAB — LIPID PANEL
Cholesterol: 142 mg/dL (ref 0–200)
HDL: 69.8 mg/dL (ref >39.00)
LDL Cholesterol: 60 mg/dL (ref 0–99)
NonHDL: 72.16
Total CHOL/HDL Ratio: 2
Triglycerides: 62 mg/dL (ref 0.0–149.0)
VLDL: 12.4 mg/dL (ref 0.0–40.0)

## 2022-07-09 LAB — LDL CHOLESTEROL, DIRECT: Direct LDL: 61 mg/dL

## 2022-07-09 LAB — HEMOGLOBIN A1C: Hgb A1c MFr Bld: 7.2 % — ABNORMAL HIGH (ref 4.6–6.5)

## 2022-07-13 ENCOUNTER — Ambulatory Visit: Payer: Medicare PPO | Admitting: Internal Medicine

## 2022-07-14 ENCOUNTER — Ambulatory Visit: Payer: Medicare PPO | Admitting: Internal Medicine

## 2022-07-14 ENCOUNTER — Encounter: Payer: Self-pay | Admitting: Internal Medicine

## 2022-07-14 VITALS — BP 132/74 | HR 71 | Temp 97.6°F | Ht 65.0 in | Wt 156.8 lb

## 2022-07-14 DIAGNOSIS — I10 Essential (primary) hypertension: Secondary | ICD-10-CM | POA: Diagnosis not present

## 2022-07-14 DIAGNOSIS — R5383 Other fatigue: Secondary | ICD-10-CM

## 2022-07-14 DIAGNOSIS — E785 Hyperlipidemia, unspecified: Secondary | ICD-10-CM

## 2022-07-14 DIAGNOSIS — E1169 Type 2 diabetes mellitus with other specified complication: Secondary | ICD-10-CM

## 2022-07-14 MED ORDER — GLIPIZIDE 5 MG PO TABS: ORAL_TABLET | ORAL | 0 refills | Status: DC

## 2022-07-14 MED ORDER — GLIPIZIDE 5 MG PO TABS: ORAL_TABLET | ORAL | 1 refills | Status: DC

## 2022-07-14 MED ORDER — METFORMIN HCL 500 MG PO TABS: 1000.0000 mg | ORAL_TABLET | Freq: Two times a day (BID) | ORAL | 1 refills | Status: DC

## 2022-07-14 NOTE — Patient Instructions (Addendum)
Please schedule your annual diabetic eye exam and ask him to send me a report  Medications will be send to [facility]  Return in 3 months

## 2022-07-14 NOTE — Progress Notes (Signed)
Subjective:  Patient ID: [name], female    DOB: 1945-08-10  Age: 77 y.o. MRN: [id number]  CC: The primary encounter diagnosis was Primary hypertension. Diagnoses of Type 2 diabetes mellitus with other specified complication, without long-term current use of insulin ([place]), Hyperlipidemia associated with type 2 diabetes mellitus ([place]), and Other fatigue were also pertinent to this visit.   HPI [name] presents for  Chief Complaint  Patient presents with   Medical Management of Chronic Issues    6 month follow up on diabetes   1) Type 2 DM:  She  feels generally well, is exercising daily and checking blood sugars once daily at variable times.  BS have been under 130 fasting and < 170 post prandially.  Denies any recent hypoglyemic events.  Taking her  medcations as directed. Following a carbohydrate modified diet 6 days per week. Denies numbness, burning and tingling of extremities. Appetite is good. MEDS REVIEWED  :  25 MG  Jardiance in the am.  Glipzide 2.5 mg and metformin 1000 mg   in the morning,  and  metformin ONLY in the evening  (no glipizide).  Reports slight loss of control due to several travel vacations ,  including trip to [place] and [place],  as well as use of  prednisone in October for sinusitis   For the last 4 weeks sugars have been lower. She walks daily and exercises at [employer] .  2) HTN;  Patient is taking her medications as prescribed and notes no adverse effects.  Home BP readings have been done about once per week and are  generally < 130/80 .  She is avoiding added salt in her diet and walking regularly about  5 times per week for exercise  .      Lab Results  Component Value Date   HGBA1C 7.2 (H) 07/09/2022    Outpatient Medications Prior to Visit  Medication Sig Dispense Refill   ACCU-CHEK AVIVA PLUS test strip USE TO TEST TWICE A DAY 100 strip 2   atorvastatin (LIPITOR) 40 MG tablet TAKE 1 TABLET BY MOUTH EVERY DAY 90 tablet 3   blood  glucose meter kit and supplies KIT Dispense based on patient and insurance preference. Use up to four times daily as directed. (FOR ICD-9 250.00, 250.01). 1 each 0   calcium-vitamin D (OSCAL WITH D) 250-125 MG-UNIT per tablet Take 2 tablets by mouth daily.     COLLAGEN PO daily.     famotidine (PEPCID) 20 MG tablet TAKE 1 TABLET (20 MG TOTAL) BY MOUTH DAILY. 30 MINUTES BEFORE DINNER 90 tablet 1   folic acid (FOLVITE) Q000111Q MCG tablet Take 2,400 mcg by mouth daily.     JARDIANCE 25 MG TABS tablet TAKE 1 TABLET BY MOUTH DAILY BEFORE BREAKFAST. 90 tablet 3   losartan (COZAAR) 50 MG tablet TAKE 1 TABLET BY MOUTH EVERY DAY 90 tablet 3   Multiple Vitamin (MULTIVITAMIN) tablet Take 1 tablet by mouth daily.     omeprazole (PRILOSEC) 40 MG capsule Take 40 mg by mouth daily. 30 capsule 1   pantoprazole (PROTONIX) 40 MG tablet TAKE 1 TABLET BY MOUTH EVERY DAY 90 tablet 1   Probiotic Product (PROBIOTIC-10 PO) Take 1 capsule by mouth daily.     glipiZIDE (GLUCOTROL) 5 MG tablet TAKE 1/2 TABLET BY MOUTH DAILY WITH BREAKFAST 45 tablet 0   metFORMIN (GLUCOPHAGE) 500 MG tablet TAKE 2 TABLETS (1,000 MG TOTAL) BY MOUTH 2 (TWO) TIMES DAILY WITH A  MEAL. 360 tablet 1   No facility-administered medications prior to visit.    Review of Systems;  Patient denies headache, fevers, malaise, unintentional weight loss, skin rash, eye pain, sinus congestion and sinus pain, sore throat, dysphagia,  hemoptysis , cough, dyspnea, wheezing, chest pain, palpitations, orthopnea, edema, abdominal pain, nausea, melena, diarrhea, constipation, flank pain, dysuria, hematuria, urinary  Frequency, nocturia, numbness, tingling, seizures,  Focal weakness, Loss of consciousness,  Tremor, insomnia, depression, anxiety, and suicidal ideation.      Objective:  BP 132/74   Pulse 71   Temp 97.6 F (36.4 C) (Oral)   Ht '5\' 5"'$  (1.651 m)   Wt 156 lb 12.8 oz (71.1 kg)   SpO2 99%   BMI 26.09 kg/m   BP Readings from Last 3 Encounters:   07/14/22 132/74  04/07/22 124/74  01/09/22 130/62    Wt Readings from Last 3 Encounters:  07/14/22 156 lb 12.8 oz (71.1 kg)  06/26/22 155 lb (70.3 kg)  04/07/22 155 lb (70.3 kg)    Physical Exam Vitals reviewed.  Constitutional:      General: She is not in acute distress.    Appearance: Normal appearance. She is normal weight. She is not ill-appearing, toxic-appearing or diaphoretic.  HENT:     Head: Normocephalic.  Eyes:     General: No scleral icterus.       Right eye: No discharge.        Left eye: No discharge.     Conjunctiva/sclera: Conjunctivae normal.  Cardiovascular:     Rate and Rhythm: Normal rate and regular rhythm.     Heart sounds: Normal heart sounds.  Pulmonary:     Effort: Pulmonary effort is normal. No respiratory distress.     Breath sounds: Normal breath sounds.  Musculoskeletal:        General: Normal range of motion.  Skin:    General: Skin is warm and dry.  Neurological:     General: No focal deficit present.     Mental Status: She is alert and oriented to person, place, and time. Mental status is at baseline.  Psychiatric:        Mood and Affect: Mood normal.        Behavior: Behavior normal.        Thought Content: Thought content normal.        Judgment: Judgment normal.   Lab Results  Component Value Date   HGBA1C 7.2 (H) 07/09/2022   HGBA1C 6.9 (H) 01/06/2022   HGBA1C 7.3 (H) 07/01/2021    Lab Results  Component Value Date   CREATININE 0.70 07/09/2022   CREATININE 0.71 01/06/2022   CREATININE 0.77 07/01/2021    Lab Results  Component Value Date   WBC 3.8 (L) 03/17/2021   HGB 14.2 03/17/2021   HCT 43.8 03/17/2021   PLT 237.0 03/17/2021   GLUCOSE 114 (H) 07/09/2022   CHOL 142 07/09/2022   TRIG 62.0 07/09/2022   HDL 69.80 07/09/2022   LDLDIRECT 61.0 07/09/2022   LDLCALC 60 07/09/2022   ALT 11 07/09/2022   AST 23 07/09/2022   NA 138 07/09/2022   K 4.1 07/09/2022   CL 101 07/09/2022   CREATININE 0.70 07/09/2022   BUN  17 07/09/2022   CO2 29 07/09/2022   TSH 1.37 12/13/2020   HGBA1C 7.2 (H) 07/09/2022   MICROALBUR <0.7 01/06/2022    No results found.  Assessment & Plan:  .Primary hypertension Assessment & Plan: Well controlled on current regimen of losartan 50 mg  daily . Renal function stable, no changes today.  Lab Results  Component Value Date   CREATININE 0.70 07/09/2022   Lab Results  Component Value Date   NA 138 07/09/2022   K 4.1 07/09/2022   CL 101 07/09/2022   CO2 29 07/09/2022     Orders: -     Comprehensive metabolic panel; Future -     Microalbumin / creatinine urine ratio; Future  Type 2 diabetes mellitus with other specified complication, without long-term current use of insulin (HCC) -     Comprehensive metabolic panel; Future -     Microalbumin / creatinine urine ratio; Future -     Hemoglobin A1c; Future -     metFORMIN HCl; Take 2 tablets (1,000 mg total) by mouth 2 (two) times daily with a meal.  Dispense: 360 tablet; Refill: 1  Hyperlipidemia associated with type 2 diabetes mellitus ([place]) Assessment & Plan: Slight rise in  a1c despite  addition of Jardiance due to dietar indulgences during last several month sof travel.  Sugars have improved  based on thelast 4 weeks of observations.  No changes today: continue once daily dosing of 2.5 mg glipizde,  follow up 3 months .  Eye exam due , reminder given  Lab Results  Component Value Date   HGBA1C 7.2 (H) 07/09/2022   Lab Results  Component Value Date   MICROALBUR <0.7 01/06/2022   MICROALBUR <0.7 12/13/2020     Lab Results  Component Value Date   LDLCALC 60 07/09/2022    Orders: -     Lipid panel; Future -     LDL cholesterol, direct; Future  Other fatigue -     CBC with Differential/Platelet; Future -     TSH; Future  Other orders -     glipiZIDE; TAKE 1/2 TABLET BY MOUTH DAILY WITH BREAKFAST  Dispense: 145 tablet; Refill: 1     Follow-up: No follow-ups on file.   Crecencio Mc, MD

## 2022-07-14 NOTE — Assessment & Plan Note (Signed)
Well controlled on current regimen of losartan 50 mg daily . Renal function stable, no changes today.  Lab Results  Component Value Date   CREATININE 0.70 07/09/2022   Lab Results  Component Value Date   NA 138 07/09/2022   K 4.1 07/09/2022   CL 101 07/09/2022   CO2 29 07/09/2022

## 2022-07-14 NOTE — Assessment & Plan Note (Signed)
Slight rise in  a1c despite  addition of Jardiance due to dietar indulgences during last several month sof travel.  Sugars have improved  based on thelast 4 weeks of observations.  No changes today: continue once daily dosing of 2.5 mg glipizde,  follow up 3 months .  Eye exam due , reminder given  Lab Results  Component Value Date   HGBA1C 7.2 (H) 07/09/2022   Lab Results  Component Value Date   MICROALBUR <0.7 01/06/2022   MICROALBUR <0.7 12/13/2020     Lab Results  Component Value Date   LDLCALC 60 07/09/2022

## 2022-07-22 ENCOUNTER — Telehealth: Payer: Self-pay | Admitting: Internal Medicine

## 2022-07-22 ENCOUNTER — Other Ambulatory Visit: Payer: Self-pay | Admitting: Internal Medicine

## 2022-07-22 NOTE — Telephone Encounter (Signed)
Pt called stating [name] will be her pharmacy now

## 2022-07-23 ENCOUNTER — Other Ambulatory Visit: Payer: Self-pay

## 2022-07-23 MED ORDER — ACCU-CHEK SOFTCLIX LANCETS MISC: 12 refills | Status: DC

## 2022-07-23 MED ORDER — ACCU-CHEK GUIDE ME W/DEVICE KIT: PACK | 0 refills | Status: AC

## 2022-07-23 MED ORDER — ACCU-CHEK GUIDE VI STRP: ORAL_STRIP | 12 refills | Status: DC

## 2022-07-23 NOTE — Telephone Encounter (Signed)
Pharmacy has been updated.

## 2022-07-28 ENCOUNTER — Other Ambulatory Visit: Payer: Self-pay

## 2022-07-28 DIAGNOSIS — E1169 Type 2 diabetes mellitus with other specified complication: Secondary | ICD-10-CM

## 2022-07-28 DIAGNOSIS — E782 Mixed hyperlipidemia: Secondary | ICD-10-CM

## 2022-07-28 MED ORDER — ACCU-CHEK SOFTCLIX LANCETS MISC: 12 refills | Status: AC

## 2022-07-28 MED ORDER — EMPAGLIFLOZIN 25 MG PO TABS: 25.0000 mg | ORAL_TABLET | Freq: Every day | ORAL | 3 refills | Status: DC

## 2022-07-28 MED ORDER — ATORVASTATIN CALCIUM 40 MG PO TABS: 40.0000 mg | ORAL_TABLET | Freq: Every day | ORAL | 3 refills | Status: DC

## 2022-07-28 MED ORDER — ACCU-CHEK AVIVA PLUS VI STRP: ORAL_STRIP | 2 refills | Status: AC

## 2022-07-28 MED ORDER — PANTOPRAZOLE SODIUM 40 MG PO TBEC: 40.0000 mg | DELAYED_RELEASE_TABLET | Freq: Every day | ORAL | 1 refills | Status: DC

## 2022-07-28 MED ORDER — LOSARTAN POTASSIUM 50 MG PO TABS: 50.0000 mg | ORAL_TABLET | Freq: Every day | ORAL | 3 refills | Status: DC

## 2022-08-17 DIAGNOSIS — H2513 Age-related nuclear cataract, bilateral: Secondary | ICD-10-CM | POA: Diagnosis not present

## 2022-08-17 DIAGNOSIS — E119 Type 2 diabetes mellitus without complications: Secondary | ICD-10-CM | POA: Diagnosis not present

## 2022-08-17 LAB — HM DIABETES EYE EXAM

## 2022-08-20 DIAGNOSIS — Z1231 Encounter for screening mammogram for malignant neoplasm of breast: Secondary | ICD-10-CM | POA: Diagnosis not present

## 2022-08-20 LAB — HM MAMMOGRAPHY

## 2022-08-25 ENCOUNTER — Encounter: Payer: Self-pay | Admitting: Internal Medicine

## 2022-09-01 ENCOUNTER — Other Ambulatory Visit: Payer: Self-pay | Admitting: Internal Medicine

## 2022-09-01 DIAGNOSIS — E1169 Type 2 diabetes mellitus with other specified complication: Secondary | ICD-10-CM

## 2022-09-30 ENCOUNTER — Encounter: Payer: Self-pay | Admitting: Podiatry

## 2022-09-30 ENCOUNTER — Ambulatory Visit: Payer: Medicare PPO | Admitting: Podiatry

## 2022-09-30 DIAGNOSIS — D2371 Other benign neoplasm of skin of right lower limb, including hip: Secondary | ICD-10-CM | POA: Diagnosis not present

## 2022-09-30 DIAGNOSIS — B351 Tinea unguium: Secondary | ICD-10-CM

## 2022-09-30 DIAGNOSIS — D2372 Other benign neoplasm of skin of left lower limb, including hip: Secondary | ICD-10-CM

## 2022-09-30 DIAGNOSIS — M79674 Pain in right toe(s): Secondary | ICD-10-CM

## 2022-09-30 DIAGNOSIS — M2041 Other hammer toe(s) (acquired), right foot: Secondary | ICD-10-CM | POA: Diagnosis not present

## 2022-09-30 DIAGNOSIS — E119 Type 2 diabetes mellitus without complications: Secondary | ICD-10-CM | POA: Diagnosis not present

## 2022-09-30 NOTE — Progress Notes (Signed)
She presents today chief complaint of painful calluses plantar aspect of the bilateral foot and painful elongated toenails.  Objective: Pulses are palpable.  Mild hammertoe deformities are noted bilateral.  Severe hammertoe deformity second right with hallux valgus deformity.  Toenails are long thick yellow dystrophic onychomycotic she also has painful benign skin lesions plantar aspect of the forefoot and medial second toe.  Assessment: Pain limb secondary to digital deformities painful onychomycosis as well as benign skin lesions.  Plan: Debridement of benign skin lesions debridement of toenails 1 through 5.  Follow-up with her in 3 months

## 2022-10-13 ENCOUNTER — Other Ambulatory Visit (INDEPENDENT_AMBULATORY_CARE_PROVIDER_SITE_OTHER): Payer: Medicare PPO

## 2022-10-13 DIAGNOSIS — R5383 Other fatigue: Secondary | ICD-10-CM | POA: Diagnosis not present

## 2022-10-13 DIAGNOSIS — E785 Hyperlipidemia, unspecified: Secondary | ICD-10-CM

## 2022-10-13 DIAGNOSIS — Z7984 Long term (current) use of oral hypoglycemic drugs: Secondary | ICD-10-CM

## 2022-10-13 DIAGNOSIS — I10 Essential (primary) hypertension: Secondary | ICD-10-CM | POA: Diagnosis not present

## 2022-10-13 DIAGNOSIS — D649 Anemia, unspecified: Secondary | ICD-10-CM

## 2022-10-13 DIAGNOSIS — E1169 Type 2 diabetes mellitus with other specified complication: Secondary | ICD-10-CM

## 2022-10-13 LAB — MICROALBUMIN / CREATININE URINE RATIO
Creatinine,U: 64.7 mg/dL
Microalb Creat Ratio: 1.7 mg/g (ref 0.0–30.0)
Microalb, Ur: 1.1 mg/dL (ref 0.0–1.9)

## 2022-10-13 LAB — COMPREHENSIVE METABOLIC PANEL
ALT: 10 U/L (ref 0–35)
AST: 22 U/L (ref 0–37)
Albumin: 3.9 g/dL (ref 3.5–5.2)
Alkaline Phosphatase: 59 U/L (ref 39–117)
BUN: 15 mg/dL (ref 6–23)
CO2: 26 mEq/L (ref 19–32)
Calcium: 9.1 mg/dL (ref 8.4–10.5)
Chloride: 100 mEq/L (ref 96–112)
Creatinine, Ser: 0.72 mg/dL (ref 0.40–1.20)
GFR: 81.68 mL/min (ref >60.00)
Glucose, Bld: 118 mg/dL — ABNORMAL HIGH (ref 70–99)
Potassium: 4.2 mEq/L (ref 3.5–5.1)
Sodium: 135 mEq/L (ref 135–145)
Total Bilirubin: 0.6 mg/dL (ref 0.2–1.2)
Total Protein: 6.7 g/dL (ref 6.0–8.3)

## 2022-10-13 LAB — CBC WITH DIFFERENTIAL/PLATELET
Basophils Absolute: 0 10*3/uL (ref 0.0–0.1)
Basophils Relative: 0.9 % (ref 0.0–3.0)
Eosinophils Absolute: 0.1 10*3/uL (ref 0.0–0.7)
Eosinophils Relative: 1.6 % (ref 0.0–5.0)
HCT: 33.8 % — ABNORMAL LOW (ref 36.0–46.0)
Hemoglobin: 10.2 g/dL — ABNORMAL LOW (ref 12.0–15.0)
Lymphocytes Relative: 29.5 % (ref 12.0–46.0)
Lymphs Abs: 1.2 10*3/uL (ref 0.7–4.0)
MCHC: 30.1 g/dL (ref 30.0–36.0)
MCV: 71.1 fl — ABNORMAL LOW (ref 78.0–100.0)
Monocytes Absolute: 0.3 10*3/uL (ref 0.1–1.0)
Monocytes Relative: 8.2 % (ref 3.0–12.0)
Neutro Abs: 2.5 10*3/uL (ref 1.4–7.7)
Neutrophils Relative %: 59.8 % (ref 43.0–77.0)
Platelets: 304 10*3/uL (ref 150.0–400.0)
RBC: 4.75 Mil/uL (ref 3.87–5.11)
RDW: 17.9 % — ABNORMAL HIGH (ref 11.5–15.5)
WBC: 4.2 10*3/uL (ref 4.0–10.5)

## 2022-10-13 LAB — LDL CHOLESTEROL, DIRECT: Direct LDL: 53 mg/dL

## 2022-10-13 LAB — LIPID PANEL
Cholesterol: 117 mg/dL (ref 0–200)
HDL: 60.6 mg/dL (ref >39.00)
LDL Cholesterol: 45 mg/dL (ref 0–99)
NonHDL: 56.04
Total CHOL/HDL Ratio: 2
Triglycerides: 55 mg/dL (ref 0.0–149.0)
VLDL: 11 mg/dL (ref 0.0–40.0)

## 2022-10-13 LAB — TSH: TSH: 2.02 u[IU]/mL (ref 0.35–5.50)

## 2022-10-13 LAB — HEMOGLOBIN A1C: Hgb A1c MFr Bld: 7.2 % — ABNORMAL HIGH (ref 4.6–6.5)

## 2022-10-15 ENCOUNTER — Ambulatory Visit: Payer: Medicare PPO | Admitting: Internal Medicine

## 2022-10-16 DIAGNOSIS — D649 Anemia, unspecified: Secondary | ICD-10-CM | POA: Insufficient documentation

## 2022-10-16 DIAGNOSIS — D509 Iron deficiency anemia, unspecified: Secondary | ICD-10-CM | POA: Insufficient documentation

## 2022-10-16 NOTE — Addendum Note (Signed)
Addended by: Sherlene Shams on: 10/16/2022 08:11 AM   Modules accepted: Orders

## 2022-10-16 NOTE — Assessment & Plan Note (Signed)
Iron, b12 folate SPEP. IFE,  FOBT ordered

## 2022-10-19 ENCOUNTER — Ambulatory Visit: Payer: Medicare PPO | Admitting: Internal Medicine

## 2022-10-19 ENCOUNTER — Encounter: Payer: Self-pay | Admitting: Internal Medicine

## 2022-10-19 VITALS — BP 112/68 | HR 67 | Temp 97.5°F | Ht 65.0 in | Wt 156.0 lb

## 2022-10-19 DIAGNOSIS — Z7984 Long term (current) use of oral hypoglycemic drugs: Secondary | ICD-10-CM | POA: Diagnosis not present

## 2022-10-19 DIAGNOSIS — D509 Iron deficiency anemia, unspecified: Secondary | ICD-10-CM | POA: Diagnosis not present

## 2022-10-19 DIAGNOSIS — D649 Anemia, unspecified: Secondary | ICD-10-CM | POA: Diagnosis not present

## 2022-10-19 DIAGNOSIS — E1169 Type 2 diabetes mellitus with other specified complication: Secondary | ICD-10-CM

## 2022-10-19 DIAGNOSIS — E782 Mixed hyperlipidemia: Secondary | ICD-10-CM

## 2022-10-19 DIAGNOSIS — E538 Deficiency of other specified B group vitamins: Secondary | ICD-10-CM

## 2022-10-19 DIAGNOSIS — K8689 Other specified diseases of pancreas: Secondary | ICD-10-CM | POA: Diagnosis not present

## 2022-10-19 MED ORDER — PANCRELIPASE (LIP-PROT-AMYL) 36000-114000 UNITS PO CPEP: ORAL_CAPSULE | ORAL | 11 refills | Status: DC

## 2022-10-19 NOTE — Assessment & Plan Note (Signed)
She has not taken pancreatic enzymes in several months due to fear of medication. She has been having multiple loose stools daily . ADVISED TO RESUME CREON AT LOWER DOSE (1 CAPSULE Dily).  Suspend metformin and follow up with GI in 2 weeks

## 2022-10-19 NOTE — Assessment & Plan Note (Signed)
Screening labs ordered.  Last blood donation was Feb 2024 (she was turned down in May due to anemia_

## 2022-10-19 NOTE — Progress Notes (Signed)
Subjective:  Patient ID: [name], female    DOB: [birth year]  Age: 77 y.o. MRN: [id number]  CC: The primary encounter diagnosis was Pancreatic insufficiency. Diagnoses of Type 2 diabetes mellitus with other specified complication, without long-term current use of insulin (HCC), Anemia, unspecified type, DM type 2 with diabetic mixed hyperlipidemia (HCC), Iron deficiency anemia, unspecified iron deficiency anemia type, and B12 deficiency were also pertinent to this visit.   HPI [name] presents for  Chief Complaint  Patient presents with   Medical Management of Chronic Issues    3 month follow up on diabetes    Newly discovered anemia:  patient reports intermittent episodes of loose stools occurring several times daily,  other times more formed.    No black stools,  no  blood in stools. No abdominal pain .  Feels bloated often despite having large bowel movements.    Was prescribed Creon but has not been taking it regularly . Does not recall the the xifaxan samples given to her by Dr Allegra Lai one year ago were helpful in relieving the bloating .  Has not followed up but has an appt with her PA on jun 18.   She donates blood every 3 months  and was turned away  in May .  Last donation was in Feb 2024   Weight loss during the past 2 years (20 lbs) not intentional . Has been stable for the last     Outpatient Medications Prior to Visit  Medication Sig Dispense Refill   Accu-Chek Softclix Lancets lancets Use as instructed 100 each 12   atorvastatin (LIPITOR) 40 MG tablet Take 1 tablet (40 mg total) by mouth daily. 90 tablet 3   blood glucose meter kit and supplies KIT Dispense based on patient and insurance preference. Use up to four times daily as directed. (FOR ICD-9 250.00, 250.01). 1 each 0   Blood Glucose Monitoring Suppl (ACCU-CHEK GUIDE ME) w/Device KIT Use to check blood sugar up to four times daily. 1 kit 0   calcium-vitamin D (OSCAL WITH D) 250-125 MG-UNIT per tablet Take 2  tablets by mouth daily.     COLLAGEN PO daily.     famotidine (PEPCID) 20 MG tablet TAKE 1 TABLET (20 MG TOTAL) BY MOUTH DAILY. 30 MINUTES BEFORE DINNER 90 tablet 1   glipiZIDE (GLUCOTROL) 5 MG tablet TAKE 1/2 TABLET BY MOUTH DAILY WITH BREAKFAST 145 tablet 1   glucose blood (ACCU-CHEK AVIVA PLUS) test strip USE TO TEST TWICE A DAY 100 strip 2   glucose blood (ACCU-CHEK GUIDE) test strip Use as instructed 100 each 12   JARDIANCE 25 MG TABS tablet TAKE 1 TABLET BY MOUTH EVERY DAY BEFORE BREAKFAST 90 tablet 3   losartan (COZAAR) 50 MG tablet Take 1 tablet (50 mg total) by mouth daily. 90 tablet 3   metFORMIN (GLUCOPHAGE) 500 MG tablet Take 2 tablets (1,000 mg total) by mouth 2 (two) times daily with a meal. 360 tablet 1   Multiple Vitamin (MULTIVITAMIN) tablet Take 1 tablet by mouth daily.     pantoprazole (PROTONIX) 40 MG tablet Take 1 tablet (40 mg total) by mouth daily. 90 tablet 1   Probiotic Product (PROBIOTIC-10 PO) Take 1 capsule by mouth daily.     No facility-administered medications prior to visit.    Review of Systems;  Patient denies headache, fevers, malaise, unintentional weight loss, skin rash, eye pain, sinus congestion and sinus pain, sore throat, dysphagia,  hemoptysis , cough, dyspnea, wheezing,  chest pain, palpitations, orthopnea, edema, abdominal pain, nausea, melena, diarrhea, constipation, flank pain, dysuria, hematuria, urinary  Frequency, nocturia, numbness, tingling, seizures,  Focal weakness, Loss of consciousness,  Tremor, insomnia, depression, anxiety, and suicidal ideation.      Objective:  BP 112/68   Pulse 67   Temp (!) 97.5 F (36.4 C) (Oral)   Ht 5\' 5"  (1.651 m)   Wt 156 lb (70.8 kg)   SpO2 99%   BMI 25.96 kg/m   BP Readings from Last 3 Encounters:  10/19/22 112/68  07/14/22 132/74  04/07/22 124/74    Wt Readings from Last 3 Encounters:  10/19/22 156 lb (70.8 kg)  07/14/22 156 lb 12.8 oz (71.1 kg)  06/26/22 155 lb (70.3 kg)    Physical  Exam Vitals reviewed.  Constitutional:      General: She is not in acute distress.    Appearance: Normal appearance. She is normal weight. She is not ill-appearing, toxic-appearing or diaphoretic.  HENT:     Head: Normocephalic.  Eyes:     General: No scleral icterus.       Right eye: No discharge.        Left eye: No discharge.     Conjunctiva/sclera: Conjunctivae normal.  Cardiovascular:     Rate and Rhythm: Normal rate and regular rhythm.     Heart sounds: Normal heart sounds.  Pulmonary:     Effort: Pulmonary effort is normal. No respiratory distress.     Breath sounds: Normal breath sounds.  Musculoskeletal:        General: Normal range of motion.  Skin:    General: Skin is warm and dry.  Neurological:     General: No focal deficit present.     Mental Status: She is alert and oriented to person, place, and time. Mental status is at baseline.  Psychiatric:        Mood and Affect: Mood normal.        Behavior: Behavior normal.        Thought Content: Thought content normal.        Judgment: Judgment normal.    Lab Results  Component Value Date   HGBA1C 7.2 (H) 10/13/2022   HGBA1C 7.2 (H) 07/09/2022   HGBA1C 6.9 (H) 01/06/2022    Lab Results  Component Value Date   CREATININE 0.72 10/13/2022   CREATININE 0.70 07/09/2022   CREATININE 0.71 01/06/2022    Lab Results  Component Value Date   WBC 5.6 10/19/2022   HGB 9.6 (L) 10/19/2022   HCT 31.8 (L) 10/19/2022   PLT 307.0 10/19/2022   GLUCOSE 118 (H) 10/13/2022   CHOL 117 10/13/2022   TRIG 55.0 10/13/2022   HDL 60.60 10/13/2022   LDLDIRECT 53.0 10/13/2022   LDLCALC 45 10/13/2022   ALT 10 10/13/2022   AST 22 10/13/2022   NA 135 10/13/2022   K 4.2 10/13/2022   CL 100 10/13/2022   CREATININE 0.72 10/13/2022   BUN 15 10/13/2022   CO2 26 10/13/2022   TSH 2.02 10/13/2022   HGBA1C 7.2 (H) 10/13/2022   MICROALBUR 1.1 10/13/2022    No results found.  Assessment & Plan:  .Pancreatic  insufficiency Assessment & Plan: She has not taken pancreatic enzymes in several months due to fear of medication. She has been having multiple loose stools daily . ADVISED TO RESUME CREON AT LOWER DOSE (1 CAPSULE Dily).  Suspend metformin and follow up with GI in 2 weeks   Type 2 diabetes mellitus with other specified  complication, without long-term current use of insulin (HCC)  Anemia, unspecified type Assessment & Plan: Screening labs confirmatory and hgb continues to drop.  FOBT is pending.   Last blood donation was Feb 2024 (she was turned down in May due to anemia  Lab Results  Component Value Date   HGB 9.6 (L) 10/19/2022     Lab Results  Component Value Date   IRON 11 (L) 10/19/2022   TIBC 427 10/19/2022   FERRITIN 3 (L) 10/19/2022     Orders: -     IFE AND PE, RANDOM URINE -     Protein electrophoresis, serum -     B12 and Folate Panel -     Fecal occult blood, imunochemical -     Iron, TIBC and Ferritin Panel -     CBC with Differential/Platelet  DM type 2 with diabetic mixed hyperlipidemia (HCC) Assessment & Plan: Suspending metformin for a two week trial due to recurrent loose stools attributed to pancreati cinsufficiency    Iron deficiency anemia, unspecified iron deficiency anemia type Assessment & Plan: Screening labs confirmatory and hgb continues to drop.  FOBT is pending.   Last blood donation was Feb 2024 (she was turned down in May due to anemia  Lab Results  Component Value Date   HGB 9.6 (L) 10/19/2022     Lab Results  Component Value Date   IRON 11 (L) 10/19/2022   TIBC 427 10/19/2022   FERRITIN 3 (L) 10/19/2022      B12 deficiency Assessment & Plan: b12 deficiency diagnosed. patient asked to return for B12 injections and IF ab   Orders: -     Intrinsic Factor Antibodies; Future  Other orders -     Pancrelipase (Lip-Prot-Amyl); Take 1 capsule (36,000 Units total) by mouth 3 (three) times daily with meals. May also take 1 capsule  (36,000 Units total) as needed (with snacks).  Dispense: 240 capsule; Refill: 11    I provided 30 minutes of face-to-face time during this encounter reviewing patient's last visit with me, patient's  most recent visit with GI, recent surgical and non surgical procedures, previous  labs and imaging studies, counseling on currently addressed issues,  and post visit ordering to diagnostics and therapeutics .   Follow-up: Return in about 3 months (around 01/19/2023) for follow up diabetes.   Sherlene Shams, MD

## 2022-10-19 NOTE — Assessment & Plan Note (Signed)
Suspending metformin for a two week trial due to recurrent loose stools attributed to pancreati cinsufficiency

## 2022-10-19 NOTE — Patient Instructions (Addendum)
Resume creon one capsule  with each meal/snack  Suspend metformin until  you see GI   Stay on current glipizide dose (1/2 tablet daily) and Send me readings in 2 weeks

## 2022-10-20 DIAGNOSIS — E538 Deficiency of other specified B group vitamins: Secondary | ICD-10-CM | POA: Insufficient documentation

## 2022-10-20 LAB — CBC WITH DIFFERENTIAL/PLATELET
Basophils Absolute: 0.1 10*3/uL (ref 0.0–0.1)
Basophils Relative: 1.4 % (ref 0.0–3.0)
Eosinophils Absolute: 0.1 10*3/uL (ref 0.0–0.7)
Eosinophils Relative: 1.2 % (ref 0.0–5.0)
HCT: 31.8 % — ABNORMAL LOW (ref 36.0–46.0)
Hemoglobin: 9.6 g/dL — ABNORMAL LOW (ref 12.0–15.0)
Lymphocytes Relative: 27.3 % (ref 12.0–46.0)
Lymphs Abs: 1.5 10*3/uL (ref 0.7–4.0)
MCHC: 30.3 g/dL (ref 30.0–36.0)
MCV: 70.6 fl — ABNORMAL LOW (ref 78.0–100.0)
Monocytes Absolute: 0.3 10*3/uL (ref 0.1–1.0)
Monocytes Relative: 5.9 % (ref 3.0–12.0)
Neutro Abs: 3.6 10*3/uL (ref 1.4–7.7)
Neutrophils Relative %: 64.2 % (ref 43.0–77.0)
Platelets: 307 10*3/uL (ref 150.0–400.0)
RBC: 4.5 Mil/uL (ref 3.87–5.11)
RDW: 17.9 % — ABNORMAL HIGH (ref 11.5–15.5)
WBC: 5.6 10*3/uL (ref 4.0–10.5)

## 2022-10-20 LAB — IRON,TIBC AND FERRITIN PANEL: Iron: 11 ug/dL — ABNORMAL LOW (ref 45–160)

## 2022-10-20 LAB — B12 AND FOLATE PANEL
Folate: >23.9 ng/mL (ref >5.9)
Vitamin B-12: 299 pg/mL (ref 211–911)

## 2022-10-20 NOTE — Assessment & Plan Note (Signed)
b12 deficiency diagnosed. patient asked to return for B12 injections and IF ab

## 2022-10-21 ENCOUNTER — Telehealth: Payer: Self-pay

## 2022-10-21 ENCOUNTER — Telehealth: Payer: Self-pay | Admitting: Internal Medicine

## 2022-10-21 DIAGNOSIS — D649 Anemia, unspecified: Secondary | ICD-10-CM

## 2022-10-21 DIAGNOSIS — E538 Deficiency of other specified B group vitamins: Secondary | ICD-10-CM

## 2022-10-21 MED ORDER — CYANOCOBALAMIN 1000 MCG/ML IJ SOLN
1000.0000 ug | INTRAMUSCULAR | 0 refills | Status: DC
Start: 2022-10-21 — End: 2023-01-19

## 2022-10-21 MED ORDER — SYRINGE 25G X 1" 3 ML MISC
0 refills | Status: DC
Start: 2022-10-21 — End: 2023-01-19

## 2022-10-21 NOTE — Telephone Encounter (Signed)
Pt is aware.  

## 2022-10-21 NOTE — Telephone Encounter (Signed)
If okay I will send to pharmacy with needles and syringes

## 2022-10-21 NOTE — Addendum Note (Signed)
Addended by: Sherlene Shams on: 10/21/2022 12:50 PM   Modules accepted: Orders

## 2022-10-21 NOTE — Telephone Encounter (Signed)
Labs ordered for lab appt on 12/07/2022.

## 2022-10-21 NOTE — Telephone Encounter (Signed)
Patient called and wanted to know if she could give herself the b12 injections, she has given herself before shots.

## 2022-10-22 ENCOUNTER — Other Ambulatory Visit (INDEPENDENT_AMBULATORY_CARE_PROVIDER_SITE_OTHER): Payer: Medicare PPO

## 2022-10-22 DIAGNOSIS — D509 Iron deficiency anemia, unspecified: Secondary | ICD-10-CM | POA: Diagnosis not present

## 2022-10-22 LAB — PROTEIN ELECTROPHORESIS, SERUM
Albumin ELP: 3.7 g/dL — ABNORMAL LOW (ref 3.8–4.8)
Alpha 1: 0.2 g/dL (ref 0.2–0.3)
Alpha 2: 0.6 g/dL (ref 0.5–0.9)
Beta 2: 0.5 g/dL (ref 0.2–0.5)
Beta Globulin: 0.6 g/dL (ref 0.4–0.6)
Gamma Globulin: 0.8 g/dL (ref 0.8–1.7)
Total Protein: 6.4 g/dL (ref 6.1–8.1)

## 2022-10-22 LAB — IRON,TIBC AND FERRITIN PANEL
%SAT: 3 % (calc) — ABNORMAL LOW (ref 16–45)
Ferritin: 3 ng/mL — ABNORMAL LOW (ref 16–288)
TIBC: 427 mcg/dL (calc) (ref 250–450)

## 2022-10-22 LAB — FECAL OCCULT BLOOD, IMMUNOCHEMICAL: Fecal Occult Bld: NEGATIVE

## 2022-10-22 NOTE — Addendum Note (Signed)
Addended by: Sherlene Shams on: 10/22/2022 01:58 PM   Modules accepted: Orders

## 2022-10-23 LAB — IFE AND PE, RANDOM URINE
% BETA, Urine: 0 %
ALBUMIN, U: 0 %
ALPHA 1 URINE: 0 %
ALPHA-2-GLOBULIN, U: 0 %
GAMMA GLOBULIN URINE: 0 %
Protein, Ur: <4 mg/dL

## 2022-10-23 NOTE — Assessment & Plan Note (Signed)
FOBT negative.  Hgb has continued to drop despite suspension of blood donation since February,  Hematology eval recommended

## 2022-10-26 ENCOUNTER — Encounter: Payer: Self-pay | Admitting: Internal Medicine

## 2022-10-27 ENCOUNTER — Telehealth: Payer: Self-pay | Admitting: Internal Medicine

## 2022-10-27 MED ORDER — PANCRELIPASE (LIP-PROT-AMYL) 36000-114000 UNITS PO CPEP: ORAL_CAPSULE | ORAL | 0 refills | Status: DC

## 2022-10-27 MED ORDER — GLIPIZIDE 5 MG PO TABS: ORAL_TABLET | ORAL | 0 refills | Status: DC

## 2022-10-27 NOTE — Telephone Encounter (Signed)
Spoke with pt to let her know that the Creon had been sent. Pt stated that she did not ask for the Creon to be refilled she needed the glipizide refilled. I have sent the glipizide and canceled the Creon.

## 2022-10-27 NOTE — Telephone Encounter (Signed)
Prescription Request  10/27/2022  LOV: 10/19/2022  What is the name of the medication or equipment? lipase/protease/amylase (CREON) 36000 UNITS CPEP capsule  Have you contacted your pharmacy to request a refill? No   Which pharmacy would you like this sent to?   [facility] [address] [place], [place] [address] Telephone #: [phone]    Patient notified that their request is being sent to the clinical staff for review and that they should receive a response within 2 business days.   Please advise at Mobile [phone] (mobile)   Pt states that she only needs 6 pills.

## 2022-10-28 ENCOUNTER — Other Ambulatory Visit: Payer: Self-pay | Admitting: Internal Medicine

## 2022-10-28 ENCOUNTER — Telehealth: Payer: Self-pay | Admitting: *Deleted

## 2022-10-28 NOTE — Telephone Encounter (Signed)
Left message to return call. See below (pt need weekly B12 injection scheduled for 4 weeks & a lab appt at the first visit for Intrinsic Factor Antibody)  Your additional labs indicate your iron stores are  low , as well as your B12.   If you can tolerate a daily iron supplement with food or orange juice to prevent  nausea  I would recommend taking one for 6 weeks and repeating your labs at that time . Your other labs are still pending.   I recommend starting b12 supplementation with weekly injections for 4 weeks.  This will require RN visits to be scheduled.  You will need to have an additional blood test for intrinsic factor antibody during your first or second visit to determine length and type of therapy.  Untreated B12 deficiency can lead to irreversible dementia and neuropathy so it is very important to treat.     Regards,     Duncan Dull, MD         the rest of your labs were normal.

## 2022-10-28 NOTE — Telephone Encounter (Signed)
Pt called back and I read the message to her and she is taking a B12 injections at home and her iron supplement. Pt is scheduled for lab work on 7/22

## 2022-10-28 NOTE — Telephone Encounter (Signed)
Left voicemail to return call. 

## 2022-10-28 NOTE — Telephone Encounter (Signed)
-----   Message from Teresa L Tullo, MD sent at 10/20/2022  7:10 PM EDT ----- Needs RN visits weekly x 4 for b12, and lab visit for IF antibody 

## 2022-10-28 NOTE — Telephone Encounter (Signed)
-----   Message from Sherlene Shams, MD sent at 10/20/2022  7:10 PM EDT ----- Needs RN visits weekly x 4 for b12, and lab visit for IF antibody

## 2022-11-01 ENCOUNTER — Other Ambulatory Visit: Payer: Self-pay | Admitting: Internal Medicine

## 2022-11-01 DIAGNOSIS — E538 Deficiency of other specified B group vitamins: Secondary | ICD-10-CM

## 2022-11-02 ENCOUNTER — Ambulatory Visit: Payer: Medicare PPO | Admitting: Gastroenterology

## 2022-11-02 NOTE — Progress Notes (Signed)
Celso Amy, PA-C [address]  [address]  [place], [place] [address]  Main: [phone]  Fax: [phone]   Primary Care Physician: Sherlene Shams, MD  Primary Gastroenterologist:  Dr. Lannette Donath / Celso Amy, PA-C   CC: Abdominal bloating, chronic constipation, Iron Deficiency Anemia  HPI: [name] is a 77 y.o. female   Last saw Dr. Allegra Lai for abdominal bloating 10/2021.  Treated for constipation with Linzess 72 mcg daily.  She is no longer on Linzess.  She reports having lifelong chronic constipation.  No current treatment.  She has a small bowel movements with straining every time she urinates.  She continues to have diffuse abdominal bloating.  She was recently diagnosed with iron deficiency and B12 deficiency anemia.  Was started on OTC oral iron once daily and B12 injection once per week for the past 3 weeks.  Stools have been dark since she started iron.  Lab 10/13/2022 showed hemoglobin 10.2, hematocrit 33, MCV 71, platelet 304.  Labs 10/19/2022 showed hemoglobin 9.6, hematocrit 31, MCV 70, total iron 11, iron saturation 3%, ferritin 3.  Vitamin B12 was 299.  Normal folate.  Colonoscopy 04/2021-normal.  2 small benign colon mucosa and hyperplastic polyps removed.  Biopsies negative for microscopic colitis.  Pancreatic fecal elastase test was mildly low.  She continues taking Creon 36,000 lipase units 2 with each meal 1 with each snack.  CT abdomen pelvis with contrast 06/2020 showed no acute abnormality.  Last EGD in 2012 -minimal gastritis, single gastric papule.  Biopsies negative for H. pylori and dysplasia.    Current Outpatient Medications  Medication Sig Dispense Refill   Accu-Chek Softclix Lancets lancets Use as instructed 100 each 12   atorvastatin (LIPITOR) 40 MG tablet Take 1 tablet (40 mg total) by mouth daily. 90 tablet 3   blood glucose meter kit and supplies KIT Dispense based on patient and insurance preference. Use up to four times daily  as directed. (FOR ICD-9 250.00, 250.01). 1 each 0   Blood Glucose Monitoring Suppl (ACCU-CHEK GUIDE ME) w/Device KIT Use to check blood sugar up to four times daily. 1 kit 0   calcium-vitamin D (OSCAL WITH D) 250-125 MG-UNIT per tablet Take 2 tablets by mouth daily.     COLLAGEN PO daily.     cyanocobalamin (VITAMIN B12) 1000 MCG/ML injection Inject 1 mL (1,000 mcg total) into the muscle once a week. FOR 4 WEEKS,  THEN MONTHLY THEREAFTER 4 mL 0   famotidine (PEPCID) 20 MG tablet TAKE 1 TABLET (20 MG TOTAL) BY MOUTH DAILY. 30 MINUTES BEFORE DINNER 90 tablet 1   glipiZIDE (GLUCOTROL) 5 MG tablet TAKE 1/2 TABLET BY MOUTH DAILY WITH BREAKFAST 6 tablet 0   glucose blood (ACCU-CHEK AVIVA PLUS) test strip USE TO TEST TWICE A DAY 100 strip 2   glucose blood (ACCU-CHEK GUIDE) test strip TEST BLOOD SUGAR TWICE DAILY 200 strip 3   JARDIANCE 25 MG TABS tablet TAKE 1 TABLET BY MOUTH EVERY DAY BEFORE BREAKFAST 90 tablet 3   losartan (COZAAR) 50 MG tablet Take 1 tablet (50 mg total) by mouth daily. 90 tablet 3   Multiple Vitamin (MULTIVITAMIN) tablet Take 1 tablet by mouth daily.     pantoprazole (PROTONIX) 40 MG tablet Take 1 tablet (40 mg total) by mouth daily. 90 tablet 1   Plecanatide (TRULANCE) 3 MG TABS Take 1 tablet (3 mg total) by mouth daily before breakfast. 30 tablet 5   polyethylene glycol-electrolytes (NULYTELY) 420 g solution  Take 4,000 mLs by mouth once for 1 dose. Use as directed for your colonoscopy preparation 4000 mL 0   Probiotic Product (PROBIOTIC-10 PO) Take 1 capsule by mouth daily.     Syringe/Needle, Disp, (SYRINGE 3CC/25GX1") 25G X 1" 3 ML MISC Use for b12 injections 50 each 0   metFORMIN (GLUCOPHAGE) 500 MG tablet Take 2 tablets (1,000 mg total) by mouth 2 (two) times daily with a meal. 360 tablet 1   No current facility-administered medications for this visit.    Allergies as of 11/03/2022 - Review Complete 11/03/2022  Allergen Reaction Noted   Amoxicillin Hives 02/11/2011    Latex Rash 01/06/2013    Past Medical History:  Diagnosis Date   COVID-19 virus infection 12/10/2020   Positive test December 04 2020    Diabetes mellitus    Gastritis    High cholesterol    Hyperlipidemia    Lipitor trial was a failure   Hypertension    PMR (polymyalgia rheumatica) (HCC)    Syncope 01/15/2019    Past Surgical History:  Procedure Laterality Date   BACK SURGERY  1995   CARDIAC CATHETERIZATION  12/16/2005   normal coronaries,  EF 60%   COLONOSCOPY WITH PROPOFOL N/A 04/30/2021   Procedure: COLONOSCOPY WITH PROPOFOL;  Surgeon: Pasty Spillers, MD;  Location: [facility] ENDOSCOPY;  Service: Endoscopy;  Laterality: N/A;  DM   SPINE SURGERY  05/18/1993   SPINE SURGERY     bone graft    TUBAL LIGATION      Review of Systems:    All systems reviewed and negative except where noted in HPI.   Physical Examination:   BP (!) 143/88   Pulse 73   Temp 98.4 F (36.9 C)   Ht 5\' 5"  (1.651 m)   Wt 156 lb 9.6 oz (71 kg)   BMI 26.06 kg/m   General: Well-nourished, well-developed in no acute distress.  Eyes: No icterus. Conjunctivae pink. Mouth: Oropharyngeal mucosa moist and pink , no lesions erythema or exudate. Lungs: Clear to auscultation bilaterally. Non-labored. Heart: Regular rate and rhythm, no murmurs rubs or gallops.  Abdomen: Bowel sounds are normal; Abdomen is Soft; No hepatosplenomegaly, masses or hernias;  No Abdominal Tenderness; No guarding or rebound tenderness. Extremities: No lower extremity edema. No clubbing or deformities. Neuro: Alert and oriented x 3.  Grossly intact. Skin: Warm and dry, no jaundice.   Psych: Alert and cooperative, normal mood and affect.   Imaging Studies: No results found.  Assessment and Plan:   [name] is a 77 y.o. y/o female   Iron Deficiency Anemia  Scheduling EGD & Colonoscopy I discussed risks of EGD and colonoscopy with patient to include risk of bleeding, perforation, and risk of sedation.  Patient  expressed understanding and agrees to proceed with procedures.    Follow-up with PCP to monitor labs.  Chronic Constipation  I gave her samples and Rx of Trulance 3 mg 1 capsule once daily to try.  Recommend high-fiber diet and 64 ounces of water daily.  Bloating  Recommend OTC Align probiotic 1 capsule once daily.  Try low FODMAP diet, handout given.   Celso Amy, PA-C  Follow up in 4 weeks after EGD and colonoscopy procedures with Dr. Allegra Lai.

## 2022-11-03 ENCOUNTER — Ambulatory Visit: Payer: Medicare PPO | Admitting: Physician Assistant

## 2022-11-03 ENCOUNTER — Encounter: Payer: Self-pay | Admitting: Physician Assistant

## 2022-11-03 VITALS — BP 143/88 | HR 73 | Temp 98.4°F | Ht 65.0 in | Wt 156.6 lb

## 2022-11-03 DIAGNOSIS — D509 Iron deficiency anemia, unspecified: Secondary | ICD-10-CM

## 2022-11-03 DIAGNOSIS — K5904 Chronic idiopathic constipation: Secondary | ICD-10-CM | POA: Diagnosis not present

## 2022-11-03 DIAGNOSIS — R140 Abdominal distension (gaseous): Secondary | ICD-10-CM | POA: Diagnosis not present

## 2022-11-03 MED ORDER — PEG 3350-KCL-NA BICARB-NACL 420 G PO SOLR: 4000.0000 mL | Freq: Once | ORAL | 0 refills | Status: AC

## 2022-11-03 MED ORDER — TRULANCE 3 MG PO TABS
1.0000 | ORAL_TABLET | Freq: Every day | ORAL | 5 refills | Status: DC
Start: 2022-11-03 — End: 2023-01-19

## 2022-11-04 DIAGNOSIS — Z09 Encounter for follow-up examination after completed treatment for conditions other than malignant neoplasm: Secondary | ICD-10-CM | POA: Diagnosis not present

## 2022-11-04 DIAGNOSIS — Z8739 Personal history of other diseases of the musculoskeletal system and connective tissue: Secondary | ICD-10-CM | POA: Diagnosis not present

## 2022-11-09 ENCOUNTER — Telehealth: Payer: Self-pay

## 2022-11-09 DIAGNOSIS — E1169 Type 2 diabetes mellitus with other specified complication: Secondary | ICD-10-CM

## 2022-11-09 NOTE — Telephone Encounter (Signed)
Pt dropped off blood sugar readings. Placed in yellow folder.

## 2022-11-11 ENCOUNTER — Encounter: Payer: Self-pay | Admitting: Internal Medicine

## 2022-11-11 MED ORDER — GLIPIZIDE 5 MG PO TABS: ORAL_TABLET | ORAL | 2 refills | Status: DC

## 2022-11-11 NOTE — Assessment & Plan Note (Signed)
June BS have been reviewed,  post prnadial bfst readings are 143 to 256   most are > 150.  Post prandial dinner sugars  144 to 204 . Fastings 24 to 163. Advised toIncrease glipizide to 5 mg  before breakfast,  and start 2.5 mg before dinner   Regards,   Duncan Dull, MD

## 2022-11-11 NOTE — Addendum Note (Signed)
Addended by: Sherlene Shams on: 11/11/2022 01:02 PM   Modules accepted: Orders

## 2022-11-16 ENCOUNTER — Encounter: Payer: Self-pay | Admitting: Internal Medicine

## 2022-11-17 NOTE — Telephone Encounter (Signed)
I have removed  [facility] from her chart.

## 2022-12-07 ENCOUNTER — Other Ambulatory Visit (INDEPENDENT_AMBULATORY_CARE_PROVIDER_SITE_OTHER): Payer: Medicare PPO

## 2022-12-07 DIAGNOSIS — E538 Deficiency of other specified B group vitamins: Secondary | ICD-10-CM | POA: Diagnosis not present

## 2022-12-07 DIAGNOSIS — D649 Anemia, unspecified: Secondary | ICD-10-CM | POA: Diagnosis not present

## 2022-12-07 LAB — CBC WITH DIFFERENTIAL/PLATELET
Absolute Monocytes: 319 cells/uL (ref 200–950)
HCT: 42 % (ref 35.0–45.0)
Neutrophils Relative %: 74.1 %
RBC: 5.12 10*6/uL — ABNORMAL HIGH (ref 3.80–5.10)
Total Lymphocyte: 18.7 %

## 2022-12-07 NOTE — Addendum Note (Signed)
Addended by: Warden Fillers on: 12/07/2022 09:43 AM   Modules accepted: Orders

## 2022-12-08 LAB — CBC WITH DIFFERENTIAL/PLATELET
Basophils Absolute: 41 cells/uL (ref 0–200)
Basophils Relative: 0.7 %
Eosinophils Absolute: 58 cells/uL (ref 15–500)
Eosinophils Relative: 1 %
Hemoglobin: 12.7 g/dL (ref 11.7–15.5)
Lymphs Abs: 1085 cells/uL (ref 850–3900)
MCH: 24.8 pg — ABNORMAL LOW (ref 27.0–33.0)
MCV: 82 fL (ref 80.0–100.0)
MPV: 9.9 fL (ref 7.5–12.5)
WBC: 5.8 10*3/uL (ref 3.8–10.8)

## 2022-12-08 LAB — IRON,TIBC AND FERRITIN PANEL: Iron: 181 ug/dL — ABNORMAL HIGH (ref 45–160)

## 2022-12-10 LAB — IRON,TIBC AND FERRITIN PANEL
%SAT: 45 % (calc) (ref 16–45)
Ferritin: 9 ng/mL — ABNORMAL LOW (ref 16–288)
TIBC: 402 mcg/dL (calc) (ref 250–450)

## 2022-12-10 LAB — INTRINSIC FACTOR ANTIBODIES: Intrinsic Factor: NEGATIVE

## 2022-12-10 LAB — CBC WITH DIFFERENTIAL/PLATELET
MCHC: 30.2 g/dL — ABNORMAL LOW (ref 32.0–36.0)
Monocytes Relative: 5.5 %
Neutro Abs: 4298 cells/uL (ref 1500–7800)
Platelets: 233 10*3/uL (ref 140–400)

## 2022-12-17 ENCOUNTER — Encounter: Payer: Self-pay | Admitting: Gastroenterology

## 2022-12-21 ENCOUNTER — Telehealth: Payer: Self-pay | Admitting: Internal Medicine

## 2022-12-21 DIAGNOSIS — E1169 Type 2 diabetes mellitus with other specified complication: Secondary | ICD-10-CM

## 2022-12-21 DIAGNOSIS — I10 Essential (primary) hypertension: Secondary | ICD-10-CM

## 2022-12-21 NOTE — Telephone Encounter (Signed)
Pended labs for your approval

## 2022-12-21 NOTE — Telephone Encounter (Signed)
Pt would like labs done before her 9/3 appointment

## 2022-12-23 ENCOUNTER — Encounter: Payer: Self-pay | Admitting: Gastroenterology

## 2022-12-24 ENCOUNTER — Ambulatory Visit
Admission: RE | Admit: 2022-12-24 | Discharge: 2022-12-24 | Disposition: A | Discharge status: Home / Self Care | Payer: Medicare PPO | Attending: Gastroenterology | Admitting: Gastroenterology

## 2022-12-24 ENCOUNTER — Encounter
Admission: RE | Disposition: A | Discharge status: Home / Self Care | Payer: Self-pay | Source: Home / Self Care | Attending: Gastroenterology

## 2022-12-24 ENCOUNTER — Ambulatory Visit: Payer: Medicare PPO | Admitting: Anesthesiology

## 2022-12-24 ENCOUNTER — Encounter: Payer: Self-pay | Admitting: Gastroenterology

## 2022-12-24 DIAGNOSIS — E119 Type 2 diabetes mellitus without complications: Secondary | ICD-10-CM | POA: Diagnosis not present

## 2022-12-24 DIAGNOSIS — K5904 Chronic idiopathic constipation: Secondary | ICD-10-CM

## 2022-12-24 DIAGNOSIS — I10 Essential (primary) hypertension: Secondary | ICD-10-CM | POA: Diagnosis not present

## 2022-12-24 DIAGNOSIS — K2980 Duodenitis without bleeding: Secondary | ICD-10-CM | POA: Insufficient documentation

## 2022-12-24 DIAGNOSIS — D509 Iron deficiency anemia, unspecified: Secondary | ICD-10-CM | POA: Diagnosis not present

## 2022-12-24 DIAGNOSIS — Z7984 Long term (current) use of oral hypoglycemic drugs: Secondary | ICD-10-CM | POA: Diagnosis not present

## 2022-12-24 DIAGNOSIS — K5730 Diverticulosis of large intestine without perforation or abscess without bleeding: Secondary | ICD-10-CM | POA: Diagnosis not present

## 2022-12-24 DIAGNOSIS — Z8719 Personal history of other diseases of the digestive system: Secondary | ICD-10-CM | POA: Diagnosis not present

## 2022-12-24 DIAGNOSIS — K5790 Diverticulosis of intestine, part unspecified, without perforation or abscess without bleeding: Secondary | ICD-10-CM | POA: Diagnosis not present

## 2022-12-24 HISTORY — PX: ESOPHAGOGASTRODUODENOSCOPY (EGD) WITH PROPOFOL: SHX5813

## 2022-12-24 HISTORY — PX: GIVENS CAPSULE STUDY: SHX5432

## 2022-12-24 HISTORY — DX: Dyspnea, unspecified: R06.00

## 2022-12-24 HISTORY — PX: COLONOSCOPY WITH PROPOFOL: SHX5780

## 2022-12-24 HISTORY — PX: BIOPSY: SHX5522

## 2022-12-24 LAB — GLUCOSE, CAPILLARY: Glucose-Capillary: 149 mg/dL — ABNORMAL HIGH (ref 70–99)

## 2022-12-24 SURGERY — COLONOSCOPY WITH PROPOFOL
Anesthesia: General

## 2022-12-24 MED ORDER — LIDOCAINE HCL (CARDIAC) PF 100 MG/5ML IV SOSY
PREFILLED_SYRINGE | INTRAVENOUS | Status: DC | PRN
  Administered 2022-12-24: 60 mg via INTRAVENOUS

## 2022-12-24 MED ORDER — ONDANSETRON HCL 4 MG/2ML IJ SOLN
INTRAMUSCULAR | Status: DC | PRN
  Administered 2022-12-24: 4 mg via INTRAVENOUS

## 2022-12-24 MED ORDER — SODIUM CHLORIDE 0.9 % IV SOLN: INTRAVENOUS | Status: DC | PRN

## 2022-12-24 MED ORDER — PROPOFOL 500 MG/50ML IV EMUL
INTRAVENOUS | Status: DC | PRN
  Administered 2022-12-24: 60 mg via INTRAVENOUS
  Administered 2022-12-24: 150 ug/kg/min via INTRAVENOUS
  Administered 2022-12-24: 10 mg via INTRAVENOUS

## 2022-12-24 MED ORDER — GLYCOPYRROLATE 0.2 MG/ML IJ SOLN
INTRAMUSCULAR | Status: DC | PRN
  Administered 2022-12-24: .1 mg via INTRAVENOUS

## 2022-12-24 MED ORDER — SODIUM CHLORIDE 0.9 % IV SOLN: INTRAVENOUS | Status: DC

## 2022-12-24 NOTE — Op Note (Signed)
[facility] Gastroenterology Patient Name: [name] Procedure Date: 12/24/2022 9:30 AM MRN: [id number] Account #: [id number] Date of Birth: Aug 04, 1945 Admit Type: Outpatient Age: 77 Room: [facility] ENDO ROOM 4 Gender: Female Note Status: Finalized Instrument Name: Upper Endoscope [id number] Procedure:             Upper GI endoscopy Indications:           Unexplained iron deficiency anemia Providers:             Toney Reil MD, MD Referring MD:          Toney Reil MD, MD (Referring MD) Medicines:             General Anesthesia Complications:         No immediate complications. Estimated blood loss: None. Procedure:             Pre-Anesthesia Assessment:                        - Prior to the procedure, a History and Physical was                         performed, and patient medications and allergies were                         reviewed. The patient is competent. The risks and                         benefits of the procedure and the sedation options and                         risks were discussed with the patient. All questions                         were answered and informed consent was obtained.                         Patient identification and proposed procedure were                         verified by the physician, the nurse, the                         anesthesiologist, the anesthetist and the technician                         in the pre-procedure area in the procedure room in the                         endoscopy suite. Mental Status Examination: alert and                         oriented. Airway Examination: normal oropharyngeal                         airway and neck mobility. Respiratory Examination:                         clear to auscultation. CV Examination: normal.  Prophylactic Antibiotics: The patient does not require                         prophylactic antibiotics. Prior Anticoagulants: The                          patient has taken no anticoagulant or antiplatelet                         agents. ASA Grade Assessment: III - A patient with                         severe systemic disease. After reviewing the risks and                         benefits, the patient was deemed in satisfactory                         condition to undergo the procedure. The anesthesia                         plan was to use general anesthesia. Immediately prior                         to administration of medications, the patient was                         re-assessed for adequacy to receive sedatives. The                         heart rate, respiratory rate, oxygen saturations,                         blood pressure, adequacy of pulmonary ventilation, and                         response to care were monitored throughout the                         procedure. The physical status of the patient was                         re-assessed after the procedure.                        After obtaining informed consent, the endoscope was                         passed under direct vision. Throughout the procedure,                         the patient's blood pressure, pulse, and oxygen                         saturations were monitored continuously. The Endoscope                         was introduced through the mouth, and advanced to the  second part of duodenum. The upper GI endoscopy was                         accomplished without difficulty. The patient tolerated                         the procedure well. Findings:      The duodenal bulb and second portion of the duodenum were normal.       Biopsies were taken with a cold forceps for histology.      The entire examined stomach was normal. Biopsies were taken with a cold       forceps for Helicobacter pylori testing.      The cardia and gastric fundus were normal on retroflexion.      The gastroesophageal junction and examined esophagus were  normal. Impression:            - Normal duodenal bulb and second portion of the                         duodenum. Biopsied.                        - Normal stomach. Biopsied.                        - Normal gastroesophageal junction and esophagus. Recommendation:        - Await pathology results.                        - Proceed with colonoscopy as scheduled                        See colonoscopy report Procedure Code(s):     --- Professional ---                        [phone], Esophagogastroduodenoscopy, flexible,                         transoral; with biopsy, single or multiple Diagnosis Code(s):     --- Professional ---                        D50.9, Iron deficiency anemia, unspecified CPT copyright 2022 American Medical Association. All rights reserved. The codes documented in this report are preliminary and upon coder review may  be revised to meet current compliance requirements. Dr. Libby Maw Toney Reil MD, MD 12/24/2022 9:51:09 AM This report has been signed electronically. Number of Addenda: 0 Note Initiated On: 12/24/2022 9:30 AM Estimated Blood Loss:  Estimated blood loss: none.      [facility]

## 2022-12-24 NOTE — Op Note (Signed)
[facility] Gastroenterology Patient Name: [name] Procedure Date: 12/24/2022 9:30 AM MRN: [id number] Account #: [id number] Date of Birth: 02-Oct-1945 Admit Type: Outpatient Age: 77 Room: [facility] ENDO ROOM 4 Gender: Female Note Status: Finalized Instrument Name: Peds Colonoscope [id number] Procedure:             Colonoscopy Indications:           Last colonoscopy: December 2022, Unexplained iron                         deficiency anemia Providers:             Toney Reil MD, MD Referring MD:          Toney Reil MD, MD (Referring MD) Medicines:             General Anesthesia Complications:         No immediate complications. Estimated blood loss: None. Procedure:             Pre-Anesthesia Assessment:                        - Prior to the procedure, a History and Physical was                         performed, and patient medications and allergies were                         reviewed. The patient is competent. The risks and                         benefits of the procedure and the sedation options and                         risks were discussed with the patient. All questions                         were answered and informed consent was obtained.                         Patient identification and proposed procedure were                         verified by the physician, the nurse, the                         anesthesiologist, the anesthetist and the technician                         in the pre-procedure area in the procedure room in the                         endoscopy suite. Mental Status Examination: alert and                         oriented. Airway Examination: normal oropharyngeal                         airway and neck mobility. Respiratory Examination:  clear to auscultation. CV Examination: normal.                         Prophylactic Antibiotics: The patient does not require                         prophylactic  antibiotics. Prior Anticoagulants: The                         patient has taken no anticoagulant or antiplatelet                         agents. ASA Grade Assessment: III - A patient with                         severe systemic disease. After reviewing the risks and                         benefits, the patient was deemed in satisfactory                         condition to undergo the procedure. The anesthesia                         plan was to use general anesthesia. Immediately prior                         to administration of medications, the patient was                         re-assessed for adequacy to receive sedatives. The                         heart rate, respiratory rate, oxygen saturations,                         blood pressure, adequacy of pulmonary ventilation, and                         response to care were monitored throughout the                         procedure. The physical status of the patient was                         re-assessed after the procedure.                        After obtaining informed consent, the colonoscope was                         passed under direct vision. Throughout the procedure,                         the patient's blood pressure, pulse, and oxygen                         saturations were monitored continuously. The  Colonoscope was introduced through the anus and                         advanced to the the cecum, identified by appendiceal                         orifice and ileocecal valve. The colonoscopy was                         performed without difficulty. The patient tolerated                         the procedure well. The quality of the bowel                         preparation was evaluated using the BBPS [facility] Bowel                         Preparation Scale) with scores of: Right Colon = 3,                         Transverse Colon = 3 and Left Colon = 3 (entire mucosa                         seen  well with no residual staining, small fragments                         of stool or opaque liquid). The total BBPS score                         equals 9. The ileocecal valve, appendiceal orifice,                         and rectum were photographed. Findings:      The perianal and digital rectal examinations were normal. Pertinent       negatives include normal sphincter tone and no palpable rectal lesions.      The entire examined colon appeared normal.      A few diverticula were found in the descending colon and transverse       colon.      The retroflexed view of the distal rectum and anal verge was normal and       showed no anal or rectal abnormalities. Impression:            - The entire examined colon is normal.                        - Diverticulosis in the descending colon and in the                         transverse colon.                        - The distal rectum and anal verge are normal on                         retroflexion view.                        -  No specimens collected. Recommendation:        - Discharge patient to home (with escort).                        - Resume previous diet today.                        - Continue present medications.                        - To visualize the small bowel, perform video capsule                         endoscopy today. Procedure Code(s):     --- Professional ---                        [phone], Colonoscopy, flexible; diagnostic, including                         collection of specimen(s) by brushing or washing, when                         performed (separate procedure) Diagnosis Code(s):     --- Professional ---                        D50.9, Iron deficiency anemia, unspecified                        K57.30, Diverticulosis of large intestine without                         perforation or abscess without bleeding CPT copyright 2022 American Medical Association. All rights reserved. The codes documented in this report are  preliminary and upon coder review may  be revised to meet current compliance requirements. Dr. Libby Maw Toney Reil MD, MD 12/24/2022 10:06:56 AM This report has been signed electronically. Number of Addenda: 0 Note Initiated On: 12/24/2022 9:30 AM Scope Withdrawal Time: 0 hours 5 minutes 15 seconds  Total Procedure Duration: 0 hours 12 minutes 22 seconds  Estimated Blood Loss:  Estimated blood loss: none.      [facility]

## 2022-12-24 NOTE — Anesthesia Preprocedure Evaluation (Signed)
Anesthesia Evaluation  Patient identified by MRN, date of birth, ID band Patient awake    Reviewed: Allergy & Precautions, NPO status , Patient's Chart, lab work & pertinent test results  Airway Mallampati: III  TM Distance: >3 FB Neck ROM: full    Dental  (+) Teeth Intact, Implants, Caps   Pulmonary neg pulmonary ROS   Pulmonary exam normal breath sounds clear to auscultation       Cardiovascular Exercise Tolerance: Good hypertension, Pt. on medications negative cardio ROS Normal cardiovascular exam Rhythm:Regular Rate:Normal     Neuro/Psych negative neurological ROS  negative psych ROS   GI/Hepatic negative GI ROS, Neg liver ROS,,,  Endo/Other  negative endocrine ROSdiabetes, Type 2, Oral Hypoglycemic Agents    Renal/GU negative Renal ROS  negative genitourinary   Musculoskeletal   Abdominal Normal abdominal exam  (+)   Peds negative pediatric ROS (+)  Hematology negative hematology ROS (+)   Anesthesia Other Findings Past Medical History: 12/10/2020: COVID-19 virus infection     Comment:  Positive test December 04 2020  No date: Diabetes mellitus No date: Dyspnea No date: Gastritis No date: High cholesterol No date: Hyperlipidemia     Comment:  Lipitor trial was a failure No date: Hypertension No date: PMR (polymyalgia rheumatica) (HCC) 01/15/2019: Syncope  Past Surgical History: 1995: BACK SURGERY 12/16/2005: CARDIAC CATHETERIZATION     Comment:  normal coronaries,  EF 60% 04/30/2021: COLONOSCOPY WITH PROPOFOL; N/A     Comment:  Procedure: COLONOSCOPY WITH PROPOFOL;  Surgeon:               Pasty Spillers, MD;  Location: [facility] ENDOSCOPY;                Service: Endoscopy;  Laterality: N/A;  DM 05/18/1993: SPINE SURGERY No date: SPINE SURGERY     Comment:  bone graft  No date: TUBAL LIGATION     Reproductive/Obstetrics negative OB ROS                               Anesthesia Physical Anesthesia Plan  ASA: 2  Anesthesia Plan: General   Post-op Pain Management:    Induction:   PONV Risk Score and Plan: Propofol infusion and TIVA  Airway Management Planned: Natural Airway  Additional Equipment:   Intra-op Plan:   Post-operative Plan:   Informed Consent: I have reviewed the patients History and Physical, chart, labs and discussed the procedure including the risks, benefits and alternatives for the proposed anesthesia with the patient or authorized representative who has indicated his/her understanding and acceptance.     Dental Advisory Given  Plan Discussed with: CRNA and Surgeon  Anesthesia Plan Comments:          Anesthesia Quick Evaluation

## 2022-12-24 NOTE — Anesthesia Postprocedure Evaluation (Signed)
Anesthesia Post Note  Patient: MILEIGH GALAT  Procedure(s) Performed: COLONOSCOPY WITH PROPOFOL ESOPHAGOGASTRODUODENOSCOPY (EGD) WITH PROPOFOL BIOPSY  Patient location during evaluation: PACU Anesthesia Type: General Level of consciousness: awake Pain management: satisfactory to patient Vital Signs Assessment: post-procedure vital signs reviewed and stable Respiratory status: spontaneous breathing and nonlabored ventilation Cardiovascular status: blood pressure returned to baseline Anesthetic complications: no   No notable events documented.   Last Vitals:  Vitals:   12/24/22 0832 12/24/22 1007  BP: (!) 155/85 105/66  Pulse: 64   Resp: 18   Temp: 36.6 C (!) 36.1 C  SpO2: 100%     Last Pain:  Vitals:   12/24/22 1007  TempSrc:   PainSc: Asleep                 VAN STAVEREN,Detrell Umscheid

## 2022-12-24 NOTE — Transfer of Care (Signed)
Immediate Anesthesia Transfer of Care Note  Patient: [name]  Procedure(s) Performed: COLONOSCOPY WITH PROPOFOL ESOPHAGOGASTRODUODENOSCOPY (EGD) WITH PROPOFOL BIOPSY  Patient Location: PACU  Anesthesia Type:MAC  Level of Consciousness: drowsy  Airway & Oxygen Therapy: Patient Spontanous Breathing and Patient connected to face mask oxygen  Post-op Assessment: Report given to RN and Post -op Vital signs reviewed and stable  Post vital signs: Reviewed  Last Vitals:  Vitals Value Taken Time  BP 105/66 12/24/22 1009  Temp 36.1 C 12/24/22 1007  Pulse 62 12/24/22 1009  Resp 13 12/24/22 1009  SpO2 99 % 12/24/22 1009  Vitals shown include unfiled device data.  Last Pain:  Vitals:   12/24/22 1007  TempSrc:   PainSc: Asleep         Complications: No notable events documented.

## 2022-12-24 NOTE — H&P (Addendum)
Arlyss Repress, MD [address]  [address]  [place], [place] [address]  Main: [phone]  Fax: [phone] Pager: [phone]  Primary Care Physician:  Sherlene Shams, MD Primary Gastroenterologist:  Dr. Arlyss Repress  Pre-Procedure History & Physical: HPI:  [name] is a 77 y.o. female is here for an endoscopy and colonoscopy.   Past Medical History:  Diagnosis Date   COVID-19 virus infection 12/10/2020   Positive test December 04 2020    Diabetes mellitus    Dyspnea    Gastritis    High cholesterol    Hyperlipidemia    Lipitor trial was a failure   Hypertension    PMR (polymyalgia rheumatica) (HCC)    Syncope 01/15/2019    Past Surgical History:  Procedure Laterality Date   BACK SURGERY  1995   CARDIAC CATHETERIZATION  12/16/2005   normal coronaries,  EF 60%   COLONOSCOPY WITH PROPOFOL N/A 04/30/2021   Procedure: COLONOSCOPY WITH PROPOFOL;  Surgeon: Pasty Spillers, MD;  Location: [facility] ENDOSCOPY;  Service: Endoscopy;  Laterality: N/A;  DM   SPINE SURGERY  05/18/1993   SPINE SURGERY     bone graft    TUBAL LIGATION      Prior to Admission medications   Medication Sig Start Date End Date Taking? Authorizing Provider  Accu-Chek Softclix Lancets lancets Use as instructed 07/28/22   Sherlene Shams, MD  atorvastatin (LIPITOR) 40 MG tablet Take 1 tablet (40 mg total) by mouth daily. 07/28/22   Sherlene Shams, MD  blood glucose meter kit and supplies KIT Dispense based on patient and insurance preference. Use up to four times daily as directed. (FOR ICD-9 250.00, 250.01). 08/29/14   Sherlene Shams, MD  Blood Glucose Monitoring Suppl (ACCU-CHEK GUIDE ME) w/Device KIT Use to check blood sugar up to four times daily. 07/23/22   Sherlene Shams, MD  calcium-vitamin D (OSCAL WITH D) 250-125 MG-UNIT per tablet Take 2 tablets by mouth daily.    [provider]  COLLAGEN PO daily.    [provider]  cyanocobalamin (VITAMIN B12) 1000 MCG/ML  injection Inject 1 mL (1,000 mcg total) into the muscle once a week. FOR 4 WEEKS,  THEN MONTHLY THEREAFTER 10/21/22   Sherlene Shams, MD  famotidine (PEPCID) 20 MG tablet TAKE 1 TABLET (20 MG TOTAL) BY MOUTH DAILY. 30 MINUTES BEFORE DINNER 08/01/18   Sherlene Shams, MD  glipiZIDE (GLUCOTROL) 5 MG tablet TAKE 1 TABLET BY MOUTH DAILY WITH BREAKFAST and 1/2 tablet before dinner 11/11/22   Sherlene Shams, MD  glucose blood (ACCU-CHEK AVIVA PLUS) test strip USE TO TEST TWICE A DAY 07/28/22   Sherlene Shams, MD  glucose blood (ACCU-CHEK GUIDE) test strip TEST BLOOD SUGAR TWICE DAILY 10/28/22   Sherlene Shams, MD  JARDIANCE 25 MG TABS tablet TAKE 1 TABLET BY MOUTH EVERY DAY BEFORE BREAKFAST 09/01/22   Sherlene Shams, MD  losartan (COZAAR) 50 MG tablet Take 1 tablet (50 mg total) by mouth daily. 07/28/22   Sherlene Shams, MD  Multiple Vitamin (MULTIVITAMIN) tablet Take 1 tablet by mouth daily.    [provider]  pantoprazole (PROTONIX) 40 MG tablet Take 1 tablet (40 mg total) by mouth daily. 07/28/22   Sherlene Shams, MD  Plecanatide (TRULANCE) 3 MG TABS Take 1 tablet (3 mg total) by mouth daily before breakfast. 11/03/22 05/02/23  Celso Amy, PA-C  Probiotic Product (PROBIOTIC-10 PO) Take 1 capsule by mouth  daily.    [provider]  Syringe/Needle, Disp, (SYRINGE 3CC/25GX1") 25G X 1" 3 ML MISC Use for b12 injections 10/21/22   Sherlene Shams, MD  ferrous sulfate 325 (65 FE) MG tablet Take 325 mg by mouth daily with breakfast.  08/06/11  [provider]    Allergies as of 11/03/2022 - Review Complete 11/03/2022  Allergen Reaction Noted   Amoxicillin Hives 02/11/2011   Latex Rash 01/06/2013    Family History  Problem Relation Age of Onset   Diabetes Mother    Heart disease Father    Stroke Father    Diabetes Father    Diabetes Maternal Grandfather    Diabetes Brother    Diabetes Brother    Breast cancer Neg Hx     Social History   Socioeconomic History   Marital  status: Married    Spouse name: Not on file   Number of children: Not on file   Years of education: Not on file   Highest education level: Bachelor's degree (e.g., BA, AB, BS)  Occupational History   Occupation: Teacher, adult education: [employer]    Comment: Retired 2014  Tobacco Use   Smoking status: Never   Smokeless tobacco: Never  Vaping Use   Vaping status: Never Used  Substance and Sexual Activity   Alcohol use: No   Drug use: No   Sexual activity: Yes    Partners: Male    Birth control/protection: None  Other Topics Concern   Not on file  Social History Narrative   Not on file   Social Determinants of Health   Financial Resource Strain: Low Risk  (10/18/2022)   Overall Financial Resource Strain (CARDIA)    Difficulty of Paying Living Expenses: Not hard at all  Food Insecurity: No Food Insecurity (10/18/2022)   Hunger Vital Sign    Worried About Running Out of Food in the Last Year: Never true    Ran Out of Food in the Last Year: Never true  Transportation Needs: No Transportation Needs (10/18/2022)   PRAPARE - Administrator, Civil Service (Medical): No    Lack of Transportation (Non-Medical): No  Physical Activity: Sufficiently Active (10/18/2022)   Exercise Vital Sign    Days of Exercise per Week: 7 days    Minutes of Exercise per Session: 90 min  Stress: Stress Concern Present (10/18/2022)   [employer] of Occupational Health - Occupational Stress Questionnaire    Feeling of Stress : To some extent  Social Connections: Socially Integrated (10/18/2022)   Social Connection and Isolation Panel [NHANES]    Frequency of Communication with Friends and Family: More than three times a week    Frequency of Social Gatherings with Friends and Family: Twice a week    Attends Religious Services: More than 4 times per year    Active Member of [employer] or Organizations: Yes    Attends Engineer, structural: More than 4 times per year    Marital Status: Married   Catering manager Violence: Not At Risk (06/26/2022)   Humiliation, Afraid, Rape, and Kick questionnaire    Fear of Current or Ex-Partner: No    Emotionally Abused: No    Physically Abused: No    Sexually Abused: No    Review of Systems: See HPI, otherwise negative ROS  Physical Exam: BP (!) 155/85   Pulse 64   Temp 97.8 F (36.6 C) (Temporal)   Resp 18   SpO2 100%  General:  Alert,  pleasant and cooperative in NAD Head:  Normocephalic and atraumatic. Neck:  Supple; no masses or thyromegaly. Lungs:  Clear throughout to auscultation.    Heart:  Regular rate and rhythm. Abdomen:  Soft, nontender and nondistended. Normal bowel sounds, without guarding, and without rebound.   Neurologic:  Alert and  oriented x4;  grossly normal neurologically.  Impression/Plan: [name] is here for an endoscopy and colonoscopy to be performed for chronic IDA  Risks, benefits, limitations, and alternatives regarding  endoscopy and colonoscopy have been reviewed with the patient.  Questions have been answered.  All parties agreeable.   Lannette Donath, MD  12/24/2022, 9:35 AM

## 2022-12-25 ENCOUNTER — Encounter: Payer: Self-pay | Admitting: Gastroenterology

## 2022-12-25 ENCOUNTER — Telehealth: Payer: Self-pay

## 2022-12-25 NOTE — Telephone Encounter (Signed)
Pa Submitted through Cover my meds for Trulance 3mg . Waiting on response from insurance company

## 2022-12-29 ENCOUNTER — Encounter: Payer: Self-pay | Admitting: Gastroenterology

## 2022-12-31 ENCOUNTER — Encounter (INDEPENDENT_AMBULATORY_CARE_PROVIDER_SITE_OTHER): Payer: Self-pay

## 2023-01-06 ENCOUNTER — Encounter: Payer: Self-pay | Admitting: Physician Assistant

## 2023-01-07 ENCOUNTER — Other Ambulatory Visit: Payer: Self-pay | Admitting: Physician Assistant

## 2023-01-07 DIAGNOSIS — K8689 Other specified diseases of pancreas: Secondary | ICD-10-CM

## 2023-01-07 MED ORDER — PANCRELIPASE (LIP-PROT-AMYL) 36000-114000 UNITS PO CPEP: ORAL_CAPSULE | ORAL | 11 refills | Status: DC

## 2023-01-07 NOTE — Progress Notes (Signed)
Patient is requesting refill fill of pancreas enzyme Creon 36,000 lipase units for pancreatic insufficiency.  I sent prescription to her pharmacy.  I refilled Creon.  2 with each meal and 1 with each snack.

## 2023-01-14 ENCOUNTER — Other Ambulatory Visit (INDEPENDENT_AMBULATORY_CARE_PROVIDER_SITE_OTHER): Payer: Medicare PPO

## 2023-01-14 DIAGNOSIS — E785 Hyperlipidemia, unspecified: Secondary | ICD-10-CM

## 2023-01-14 DIAGNOSIS — E782 Mixed hyperlipidemia: Secondary | ICD-10-CM | POA: Diagnosis not present

## 2023-01-14 DIAGNOSIS — E1169 Type 2 diabetes mellitus with other specified complication: Secondary | ICD-10-CM | POA: Diagnosis not present

## 2023-01-14 DIAGNOSIS — I10 Essential (primary) hypertension: Secondary | ICD-10-CM

## 2023-01-14 LAB — COMPREHENSIVE METABOLIC PANEL
ALT: 16 U/L (ref 0–35)
AST: 27 U/L (ref 0–37)
Albumin: 3.9 g/dL (ref 3.5–5.2)
Alkaline Phosphatase: 88 U/L (ref 39–117)
BUN: 16 mg/dL (ref 6–23)
CO2: 28 mEq/L (ref 19–32)
Calcium: 9.1 mg/dL (ref 8.4–10.5)
Chloride: 101 mEq/L (ref 96–112)
Creatinine, Ser: 0.84 mg/dL (ref 0.40–1.20)
GFR: 67.76 mL/min (ref >60.00)
Glucose, Bld: 129 mg/dL — ABNORMAL HIGH (ref 70–99)
Potassium: 4.1 mEq/L (ref 3.5–5.1)
Sodium: 138 mEq/L (ref 135–145)
Total Bilirubin: 0.7 mg/dL (ref 0.2–1.2)
Total Protein: 6.5 g/dL (ref 6.0–8.3)

## 2023-01-14 LAB — LIPID PANEL
Cholesterol: 132 mg/dL (ref 0–200)
HDL: 54 mg/dL (ref >39.00)
LDL Cholesterol: 60 mg/dL (ref 0–99)
NonHDL: 77.79
Total CHOL/HDL Ratio: 2
Triglycerides: 87 mg/dL (ref 0.0–149.0)
VLDL: 17.4 mg/dL (ref 0.0–40.0)

## 2023-01-14 LAB — LDL CHOLESTEROL, DIRECT: Direct LDL: 81 mg/dL

## 2023-01-14 LAB — HEMOGLOBIN A1C: Hgb A1c MFr Bld: 6.9 % — ABNORMAL HIGH (ref 4.6–6.5)

## 2023-01-19 ENCOUNTER — Encounter: Payer: Self-pay | Admitting: Internal Medicine

## 2023-01-19 ENCOUNTER — Ambulatory Visit: Payer: Medicare PPO | Admitting: Internal Medicine

## 2023-01-19 VITALS — BP 110/70 | HR 81 | Temp 97.8°F | Ht 64.57 in | Wt 157.0 lb

## 2023-01-19 DIAGNOSIS — R194 Change in bowel habit: Secondary | ICD-10-CM

## 2023-01-19 DIAGNOSIS — D509 Iron deficiency anemia, unspecified: Secondary | ICD-10-CM | POA: Diagnosis not present

## 2023-01-19 DIAGNOSIS — E1169 Type 2 diabetes mellitus with other specified complication: Secondary | ICD-10-CM | POA: Diagnosis not present

## 2023-01-19 DIAGNOSIS — M05719 Rheumatoid arthritis with rheumatoid factor of unspecified shoulder without organ or systems involvement: Secondary | ICD-10-CM

## 2023-01-19 DIAGNOSIS — Z23 Encounter for immunization: Secondary | ICD-10-CM | POA: Diagnosis not present

## 2023-01-19 DIAGNOSIS — Z13820 Encounter for screening for osteoporosis: Secondary | ICD-10-CM | POA: Diagnosis not present

## 2023-01-19 DIAGNOSIS — E538 Deficiency of other specified B group vitamins: Secondary | ICD-10-CM

## 2023-01-19 DIAGNOSIS — Z1211 Encounter for screening for malignant neoplasm of colon: Secondary | ICD-10-CM

## 2023-01-19 DIAGNOSIS — I10 Essential (primary) hypertension: Secondary | ICD-10-CM | POA: Diagnosis not present

## 2023-01-19 DIAGNOSIS — E782 Mixed hyperlipidemia: Secondary | ICD-10-CM

## 2023-01-19 DIAGNOSIS — E785 Hyperlipidemia, unspecified: Secondary | ICD-10-CM

## 2023-01-19 DIAGNOSIS — K8689 Other specified diseases of pancreas: Secondary | ICD-10-CM

## 2023-01-19 MED ORDER — VITAMIN B-12 1000 MCG PO TABS: 1000.0000 ug | ORAL_TABLET | Freq: Every day | ORAL | Status: AC

## 2023-01-19 MED ORDER — PANCRELIPASE (LIP-PROT-AMYL) 36000-114000 UNITS PO CPEP: ORAL_CAPSULE | ORAL | 11 refills | Status: DC

## 2023-01-19 MED ORDER — GLIPIZIDE 5 MG PO TABS: ORAL_TABLET | ORAL | 2 refills | Status: DC

## 2023-01-19 NOTE — Assessment & Plan Note (Addendum)
Last DEXA was July 2023. T score wwas -2.2 forearm,  -1.1 elsewhere

## 2023-01-19 NOTE — Progress Notes (Signed)
Subjective:  Patient ID: [name], female    DOB: [birth year]  Age: 77 y.o. MRN: [id number]  CC: The primary encounter diagnosis was Primary hypertension. 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   HPI [name] presents for  Chief Complaint  Patient presents with   Medical Management of Chronic Issues   1)T2DM:    She  feels generally well, is   exercising regularly both at the gym and with regular walks up to 3 miles daily .  She is  maintaining weight. Checking  blood sugars twice daily , fasting and post prandially, more often  if she feels she may be having a hypoglycemic event. .  Fasting BS for the past month  have ranged from 117 to 165  and < 157 to 217  post prandially.  Denies any recent hypoglyemic events.  Stopped  metformin due to loose stools in early June ,  Now notes occasional  constipation managed  with miralax ,  taking glipizide 5 mg qam and 2.5 mg  pre dinner.  Following a carbohydrate modified diet 7 days per week. Denies numbness, burning and tingling of extremities. Appetite is good.  Diet reviewed:    Breakfast is cereal with 1/2 banana , 2% milk and strawberries/blueberries   2) IDA:  reviewed EGD small bowel capsule videography and colonoscopy done 2 weeks ago.   All normal.  Still taking iron .  Wants to resume donating blood .  Wants   3) pancreatic insufficency:  has reduced creon to bid, stools now range from slightly soft but formed,  to large and somewhat hard.  requiring use of stool softener  4) FH  of pancreatic CA:  BROTHER RECENTLY DIAGNOSED AT AGE 89 WITH locally advanced  pancreatic CA :  s/p [place]. Now struggling on chem  5) history of RA: has been off of DMARDS since December without recurrent symptoms   Outpatient Medications Prior to Visit  Medication Sig Dispense Refill   Accu-Chek Softclix Lancets lancets Use as instructed 100 each 12   atorvastatin (LIPITOR) 40 MG tablet Take 1 tablet (40 mg total) by mouth daily. 90 tablet 3   blood glucose meter kit and supplies KIT Dispense based on patient and insurance preference. Use up to four times daily as directed. (FOR ICD-9 250.00, 250.01). 1 each 0   Blood Glucose Monitoring Suppl (ACCU-CHEK GUIDE ME) w/Device KIT Use to check blood sugar up to four times daily. 1 kit 0   calcium-vitamin D (OSCAL WITH D) 250-125 MG-UNIT per tablet Take 2 tablets by mouth daily.     COLLAGEN PO daily.     glucose blood (ACCU-CHEK AVIVA PLUS) test strip USE TO TEST TWICE A DAY 100 strip 2   glucose blood (ACCU-CHEK GUIDE) test strip TEST BLOOD SUGAR TWICE DAILY 200 strip 3   JARDIANCE 25 MG TABS tablet TAKE 1 TABLET BY MOUTH EVERY DAY BEFORE BREAKFAST 90 tablet 3   losartan (COZAAR) 50 MG tablet Take 1 tablet (50 mg total) by mouth daily. 90 tablet 3   Multiple Vitamin (MULTIVITAMIN) tablet Take 1 tablet by mouth daily.     Probiotic Product (PROBIOTIC-10  PO) Take 1 capsule by mouth daily.     famotidine (PEPCID) 20 MG tablet TAKE 1 TABLET (20 MG TOTAL) BY MOUTH DAILY. 30 MINUTES BEFORE DINNER 90 tablet 1   glipiZIDE (GLUCOTROL) 5 MG tablet TAKE 1 TABLET BY MOUTH DAILY WITH BREAKFAST and 1/2 tablet before dinner 45 tablet 2   lipase/protease/amylase (CREON) 36000 UNITS CPEP capsule Take 2 capsules (72,000 Units total) by mouth 3 (three) times daily with meals. May also take 1 capsule (36,000 Units total) as needed (with snacks). 240 capsule 11   pantoprazole (PROTONIX) 40 MG tablet Take 1 tablet (40 mg total) by mouth daily. 90 tablet 1    cyanocobalamin (VITAMIN B12) 1000 MCG/ML injection Inject 1 mL (1,000 mcg total) into the muscle once a week. FOR 4 WEEKS,  THEN MONTHLY THEREAFTER 4 mL 0   Plecanatide (TRULANCE) 3 MG TABS Take 1 tablet (3 mg total) by mouth daily before breakfast. 30 tablet 5   Syringe/Needle, Disp, (SYRINGE 3CC/25GX1") 25G X 1" 3 ML MISC Use for b12 injections 50 each 0   No facility-administered medications prior to visit.    Review of Systems;  Patient denies headache, fevers, malaise, unintentional weight loss, skin rash, eye pain, sinus congestion and sinus pain, sore throat, dysphagia,  hemoptysis , cough, dyspnea, wheezing, chest pain, palpitations, orthopnea, edema, abdominal pain, nausea, melena, diarrhea, constipation, flank pain, dysuria, hematuria, urinary  Frequency, nocturia, numbness, tingling, seizures,  Focal weakness, Loss of consciousness,  Tremor, insomnia, depression, anxiety, and suicidal ideation.      Objective:  BP 110/70   Pulse 81   Temp 97.8 F (36.6 C) (Oral)   Ht 5' 4.57" (1.64 m)   Wt 157 lb (71.2 kg)   SpO2 98%   BMI 26.48 kg/m   BP Readings from Last 3 Encounters:  01/19/23 110/70  12/24/22 127/76  11/03/22 (!) 143/88    Wt Readings from Last 3 Encounters:  01/19/23 157 lb (71.2 kg)  11/03/22 156 lb 9.6 oz (71 kg)  10/19/22 156 lb (70.8 kg)    Physical Exam Vitals reviewed.  Constitutional:      General: She is not in acute distress.    Appearance: Normal appearance. She is normal weight. She is not ill-appearing, toxic-appearing or diaphoretic.  HENT:     Head: Normocephalic.  Eyes:     General: No scleral icterus.       Right eye: No discharge.        Left eye: No discharge.     Conjunctiva/sclera: Conjunctivae normal.  Cardiovascular:     Rate and Rhythm: Normal rate and regular rhythm.     Heart sounds: Normal heart sounds.  Pulmonary:     Effort: Pulmonary effort is normal. No respiratory distress.     Breath sounds: Normal breath sounds.   Musculoskeletal:        General: Normal range of motion.  Skin:    General: Skin is warm and dry.  Neurological:     General: No focal deficit present.     Mental Status: She is alert and oriented to person, place, and time. Mental status is at baseline.  Psychiatric:        Mood and Affect: Mood normal.        Behavior: Behavior normal.        Thought Content: Thought content normal.        Judgment: Judgment normal.     Lab Results  Component Value Date   HGBA1C 6.9 (H) 01/14/2023  HGBA1C 7.2 (H) 10/13/2022   HGBA1C 7.2 (H) 07/09/2022    Lab Results  Component Value Date   CREATININE 0.84 01/14/2023   CREATININE 0.72 10/13/2022   CREATININE 0.70 07/09/2022    Lab Results  Component Value Date   WBC 5.8 12/07/2022   HGB 12.7 12/07/2022   HCT 42.0 12/07/2022   PLT 233 12/07/2022   GLUCOSE 129 (H) 01/14/2023   CHOL 132 01/14/2023   TRIG 87.0 01/14/2023   HDL 54.00 01/14/2023   LDLDIRECT 81.0 01/14/2023   LDLCALC 60 01/14/2023   ALT 16 01/14/2023   AST 27 01/14/2023   NA 138 01/14/2023   K 4.1 01/14/2023   CL 101 01/14/2023   CREATININE 0.84 01/14/2023   BUN 16 01/14/2023   CO2 28 01/14/2023   TSH 2.02 10/13/2022   HGBA1C 6.9 (H) 01/14/2023   MICROALBUR 1.1 10/13/2022    No results found.  Assessment & Plan:  .Primary hypertension -     Comprehensive metabolic panel; Future  DM type 2 with diabetic mixed hyperlipidemia (HCC) -     Hemoglobin A1c; Future -     Lipid panel; Future -     LDL cholesterol, direct; Future  Pancreatic insufficiency Assessment & Plan: Has reduced use of Creon  to twice daily  with no diarrhea or weight loss.     Iron deficiency anemia, unspecified iron deficiency anemia type Assessment & Plan: Appears to have been due iatrogenic ID due to blood donation and B12 deficiency.  FOBT negative.  Negative GI work including EGD, colonoscopy and small bowel vidoeocapsulograpy August 2023.   Hgb has improved  to 12.7 since  suspension of blood donation in February.  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   She was assessed for persistent disease remission off of DMARD therapy and remain  in remission.  She will follow up annually with rheumatology     Screening for osteoporosis Assessment & Plan: Last DEXA was July 2023. T score wwas -2.2 forearm,  -1.1 elsewhere    Special screening for malignant neoplasms, colon Assessment & Plan: Normal colonoscopy August 2024. (Except for tics)   B12 deficiency Assessment & Plan: CONTINUE ORAL SUPPLEMENTS  METFORMIN SUSPENSION    Orders: -     B12 and Folate Panel; Future  Hyperlipidemia associated with type 2 diabetes mellitus (HCC) Assessment & Plan: AT GOAL FOR AGE. = CONTINUE jardiance,  increase glipizide morning dose to 7.5 mg  continue 2.5 mg before dinner .   follow up 3 months .   Lab Results  Component Value Date   HGBA1C 6.9 (H) 01/14/2023   Lab Results  Component Value Date   MICROALBUR 1.1 10/13/2022   MICROALBUR <0.7 01/06/2022     Lab Results  Component Value Date   LDLCALC 60 01/14/2023     Altered bowel habits Assessment & Plan: Multifactorial.  Metformin suspension has resolved the loose stools (with initiation of pancreatic enzymes  likely the most important contributor,    Other orders -     glipiZIDE; TAKE 1.5 TABLET BY MOUTH DAILY WITH BREAKFAST and 1/2 tablet before dinner  Dispense: 180 tablet; Refill: 2 -     Pancrelipase (Lip-Prot-Amyl);  Take 1 capsule (36,000 Units total) by mouth 3 (three) times daily with meals. May also take 1 capsule (36,000 Units total) as needed (with snacks).  Dispense: 240 capsule; Refill: 11 -     Vitamin B-12; Take 1 tablet (1,000 mcg total) by mouth daily.     I provided 30 minutes of face-to-face time during this encounter reviewing patient's last visit with me, patient's  most recent visit with cardiology,  nephrology,  and neurology,  recent surgical and non surgical procedures, previous  labs and imaging studies, counseling on currently addressed issues,  and post visit ordering to diagnostics and therapeutics .   Follow-up: Return in about 3 months (around 04/20/2023).   Sherlene Shams, MD

## 2023-01-19 NOTE — Assessment & Plan Note (Signed)
She was last seen 05/05/2022 aby Dr Allena Katz at [place] and was doing mostly well off of medicine.   She was assessed for persistent disease remission off of DMARD therapy and remain  in remission.  She will follow up annually with rheumatology

## 2023-01-19 NOTE — Assessment & Plan Note (Addendum)
Appears to have been due iatrogenic ID due to blood donation and B12 deficiency.  FOBT negative.  Negative GI work including EGD, colonoscopy and small bowel vidoeocapsulograpy August 2023.   Hgb has improved  to 12.7 since suspension of blood donation in February.  Will stop iron and repeat CBC/iron in 3 months   Lab Results  Component Value Date   WBC 5.8 12/07/2022   HGB 12.7 12/07/2022   HCT 42.0 12/07/2022   MCV 82.0 12/07/2022   PLT 233 12/07/2022

## 2023-01-19 NOTE — Assessment & Plan Note (Signed)
AT GOAL FOR AGE. = CONTINUE jardiance,  increase glipizide morning dose to 7.5 mg  continue 2.5 mg before dinner .   follow up 3 months .   Lab Results  Component Value Date   HGBA1C 6.9 (H) 01/14/2023   Lab Results  Component Value Date   MICROALBUR 1.1 10/13/2022   MICROALBUR <0.7 01/06/2022     Lab Results  Component Value Date   LDLCALC 60 01/14/2023

## 2023-01-19 NOTE — Patient Instructions (Addendum)
Do not resume metformin.  It caused you to have  loose stools   Your anemia has resolved but you do not need to donate blood at this time/   Continue b12 supplements  but stop iron supplements;  we will repeat your iron studies in 3 months  Your constipation should improve once you stop the iron .  You may not need the stool softener   Increase the morning glipizide dose to 7.5 mg and  continue 2.5 mg before dinner   Continue Jardiance as well   Continue one creon capsule before breakfast , (lunch) and dinner.     You can stop the famotidine and the protonix (pantoprazole) since the endoscopy  was normal

## 2023-01-19 NOTE — Assessment & Plan Note (Signed)
CONTINUE ORAL SUPPLEMENTS  METFORMIN SUSPENSION

## 2023-01-19 NOTE — Assessment & Plan Note (Signed)
Normal colonoscopy August 2024. (Except for tics)

## 2023-01-19 NOTE — Assessment & Plan Note (Signed)
Multifactorial.  Metformin suspension has resolved the loose stools (with initiation of pancreatic enzymes likely the most important contributor,

## 2023-01-19 NOTE — Assessment & Plan Note (Signed)
Has reduced use of Creon  to twice daily  with no diarrhea or weight loss.

## 2023-01-26 NOTE — Progress Notes (Unsigned)
Celso Amy, PA-C [address]  [address]  [place], [place] [address]  Main: [phone]  Fax: [phone]   Primary Care Physician: Sherlene Shams, MD  Primary Gastroenterologist:  Celso Amy, PA-C / Dr. Lannette Donath    CC: F/U IDA, Constipation  HPI: [name] is a 77 y.o. female returns for 71-month follow-up of iron deficiency anemia, chronic constipation, and bloating.  She was started on Trulance 3 mg 1 capsule daily, align probiotic, and given low FODMAP diet.  She has history of exocrine pancreatic insufficiency.  Takes Creon 36,000 lipase units 2 capsules with each meal and 1 with each snack.  EGD by Dr. Allegra Lai 12/24/2022 was normal.  Biopsies negative for H. pylori and celiac.  Colonoscopy 12/24/2022 showed mild diverticulosis, otherwise normal.  Capsule Endoscopy 01/01/23 was Normal.  Labs 12/07/2022 showed hemoglobin improved from 9.6 up to 12.7 after starting iron and B12 supplements.  Total iron 181, iron saturation 45%, ferritin 9.  She continues taking vitamin B12, 1000 mcg daily.  She is no longer on oral iron.  Labs are monitored through her PCP Dr. Darrick Huntsman.  Current Symptoms: None.  She is feeling well with no GI concerns today.  Gas has improved.  Constipation has improved on MiraLAX.  She is not taking Trulance.  MiraLAX works well.  She is taking align probiotic and Creon.  She denies abdominal pain, melena, hematochezia, or weight loss.  Patient states she has donated blood and platelets frequently at the [facility] for many years.  Last blood donation was 3 or 4 months ago.  Current Outpatient Medications  Medication Sig Dispense Refill   Accu-Chek Softclix Lancets lancets Use as instructed 100 each 12   atorvastatin (LIPITOR) 40 MG tablet Take 1 tablet (40 mg total) by mouth daily. 90 tablet 3   blood glucose meter kit and supplies KIT Dispense based on patient and insurance preference. Use up to four times daily as directed. (FOR ICD-9 250.00, 250.01). 1  each 0   Blood Glucose Monitoring Suppl (ACCU-CHEK GUIDE ME) w/Device KIT Use to check blood sugar up to four times daily. 1 kit 0   calcium-vitamin D (OSCAL WITH D) 250-125 MG-UNIT per tablet Take 2 tablets by mouth daily.     COLLAGEN PO daily.     cyanocobalamin (VITAMIN B12) 1000 MCG tablet Take 1 tablet (1,000 mcg total) by mouth daily.     glipiZIDE (GLUCOTROL) 5 MG tablet TAKE 1.5 TABLET BY MOUTH DAILY WITH BREAKFAST and 1/2 tablet before dinner 180 tablet 2   glucose blood (ACCU-CHEK AVIVA PLUS) test strip USE TO TEST TWICE A DAY 100 strip 2   glucose blood (ACCU-CHEK GUIDE) test strip TEST BLOOD SUGAR TWICE DAILY 200 strip 3   JARDIANCE 25 MG TABS tablet TAKE 1 TABLET BY MOUTH EVERY DAY BEFORE BREAKFAST 90 tablet 3   lipase/protease/amylase (CREON) 36000 UNITS CPEP capsule Take 1 capsule (36,000 Units total) by mouth 3 (three) times daily with meals. May also take 1 capsule (36,000 Units total) as needed (with snacks). 240 capsule 11   losartan (COZAAR) 50 MG tablet Take 1 tablet (50 mg total) by mouth daily. 90 tablet 3   Multiple Vitamin (MULTIVITAMIN) tablet Take 1 tablet by mouth daily.     Probiotic Product (PROBIOTIC-10 PO) Take 1 capsule by mouth daily.     No current facility-administered medications for this visit.    Allergies as of 01/27/2023 - Review Complete 01/27/2023  Allergen Reaction Noted  Amoxicillin Hives 02/11/2011   Latex Rash 01/06/2013    Past Medical History:  Diagnosis Date   COVID-19 virus infection 12/10/2020   Positive test December 04 2020    Diabetes mellitus    Dyspnea    Gastritis    High cholesterol    Hyperlipidemia    Lipitor trial was a failure   Hypertension    PMR (polymyalgia rheumatica) (HCC)    Syncope 01/15/2019    Past Surgical History:  Procedure Laterality Date   BACK SURGERY  1995   BIOPSY  12/24/2022   Procedure: BIOPSY;  Surgeon: Toney Reil, MD;  Location: [facility] ENDOSCOPY;  Service: Gastroenterology;;   CARDIAC  CATHETERIZATION  12/16/2005   normal coronaries,  EF 60%   COLONOSCOPY WITH PROPOFOL N/A 04/30/2021   Procedure: COLONOSCOPY WITH PROPOFOL;  Surgeon: Pasty Spillers, MD;  Location: [facility] ENDOSCOPY;  Service: Endoscopy;  Laterality: N/A;  DM   COLONOSCOPY WITH PROPOFOL N/A 12/24/2022   Procedure: COLONOSCOPY WITH PROPOFOL;  Surgeon: Toney Reil, MD;  Location: [facility] ENDOSCOPY;  Service: Gastroenterology;  Laterality: N/A;   ESOPHAGOGASTRODUODENOSCOPY (EGD) WITH PROPOFOL N/A 12/24/2022   Procedure: ESOPHAGOGASTRODUODENOSCOPY (EGD) WITH PROPOFOL;  Surgeon: Toney Reil, MD;  Location: [facility] ENDOSCOPY;  Service: Gastroenterology;  Laterality: N/A;   GIVENS CAPSULE STUDY  12/24/2022   Procedure: GIVENS CAPSULE STUDY;  Surgeon: Toney Reil, MD;  Location[facility] ENDOSCOPY;  Service: Gastroenterology;;   SPINE SURGERY  05/18/1993   SPINE SURGERY     bone graft    TUBAL LIGATION      Review of Systems:    All systems reviewed and negative except where noted in HPI.   Physical Examination:   BP 129/77   Pulse 71   Temp 97.7 F (36.5 C)   Ht 5\' 5"  (1.651 m)   Wt 160 lb (72.6 kg)   BMI 26.63 kg/m   General: Well-nourished, well-developed in no acute distress.  Neuro: Alert and oriented x 3.  Grossly intact.  Psych: Alert and cooperative, normal mood and affect. No Exam Performed.  Imaging Studies: See HPI.  Assessment and Plan:   [name] is a 77 y.o. y/o female returns for follow-up of iron deficiency and B12 deficiency anemia, chronic constipation, and EPI.  Recent EGD, colonoscopy, and capsule endoscopy were normal.  Biopsies negative for celiac and H. pylori.  No source for anemia.  Patient reports frequent donation at the [facility] which could be a source for her iron deficiency anemia.  She is advised not to donate blood at the [facility].  1.  Iron deficiency anemia  Monitor labs through PCP  She is advised not to donate blood at the [facility].  Recent  EGD, colonoscopy, and capsule endoscopy were normal.  No source for anemia.  2.  B12 deficiency  Continue B12 supplement  Monitor labs through PCP  3.  Chronic constipation with bloating  Continue OTC Miralax and OTC Align Probiotic as needed.  4.  Exocrine pancreatic insufficiency  Continue Creon 36,000 lipase units 2 with each meal and 1 each snack.    Celso Amy, PA-C  Follow up as needed if she has recurrent GI symptoms.

## 2023-01-27 ENCOUNTER — Encounter: Payer: Self-pay | Admitting: Physician Assistant

## 2023-01-27 ENCOUNTER — Ambulatory Visit: Payer: Medicare PPO | Admitting: Physician Assistant

## 2023-01-27 VITALS — BP 129/77 | HR 71 | Temp 97.7°F | Ht 65.0 in | Wt 160.0 lb

## 2023-01-27 DIAGNOSIS — K5909 Other constipation: Secondary | ICD-10-CM

## 2023-01-27 DIAGNOSIS — D649 Anemia, unspecified: Secondary | ICD-10-CM

## 2023-01-27 DIAGNOSIS — D509 Iron deficiency anemia, unspecified: Secondary | ICD-10-CM | POA: Diagnosis not present

## 2023-01-27 DIAGNOSIS — K8689 Other specified diseases of pancreas: Secondary | ICD-10-CM

## 2023-01-27 DIAGNOSIS — R140 Abdominal distension (gaseous): Secondary | ICD-10-CM | POA: Diagnosis not present

## 2023-01-27 DIAGNOSIS — K8681 Exocrine pancreatic insufficiency: Secondary | ICD-10-CM

## 2023-01-27 DIAGNOSIS — K5904 Chronic idiopathic constipation: Secondary | ICD-10-CM

## 2023-01-27 DIAGNOSIS — E538 Deficiency of other specified B group vitamins: Secondary | ICD-10-CM

## 2023-04-21 DIAGNOSIS — J069 Acute upper respiratory infection, unspecified: Secondary | ICD-10-CM | POA: Diagnosis not present

## 2023-04-21 DIAGNOSIS — Z03818 Encounter for observation for suspected exposure to other biological agents ruled out: Secondary | ICD-10-CM | POA: Diagnosis not present

## 2023-04-22 ENCOUNTER — Other Ambulatory Visit: Payer: Medicare PPO

## 2023-04-22 DIAGNOSIS — Z7984 Long term (current) use of oral hypoglycemic drugs: Secondary | ICD-10-CM | POA: Diagnosis not present

## 2023-04-22 DIAGNOSIS — I10 Essential (primary) hypertension: Secondary | ICD-10-CM | POA: Diagnosis not present

## 2023-04-22 DIAGNOSIS — E538 Deficiency of other specified B group vitamins: Secondary | ICD-10-CM | POA: Diagnosis not present

## 2023-04-22 DIAGNOSIS — E782 Mixed hyperlipidemia: Secondary | ICD-10-CM | POA: Diagnosis not present

## 2023-04-22 DIAGNOSIS — E1169 Type 2 diabetes mellitus with other specified complication: Secondary | ICD-10-CM | POA: Diagnosis not present

## 2023-04-22 LAB — COMPREHENSIVE METABOLIC PANEL
ALT: 16 U/L (ref 0–35)
AST: 27 U/L (ref 0–37)
Albumin: 4 g/dL (ref 3.5–5.2)
Alkaline Phosphatase: 92 U/L (ref 39–117)
BUN: 13 mg/dL (ref 6–23)
CO2: 28 meq/L (ref 19–32)
Calcium: 9.1 mg/dL (ref 8.4–10.5)
Chloride: 101 meq/L (ref 96–112)
Creatinine, Ser: 0.78 mg/dL (ref 0.40–1.20)
GFR: 73.92 mL/min (ref >60.00)
Glucose, Bld: 141 mg/dL — ABNORMAL HIGH (ref 70–99)
Potassium: 4.3 meq/L (ref 3.5–5.1)
Sodium: 136 meq/L (ref 135–145)
Total Bilirubin: 1.2 mg/dL (ref 0.2–1.2)
Total Protein: 7.2 g/dL (ref 6.0–8.3)

## 2023-04-22 LAB — HEMOGLOBIN A1C: Hgb A1c MFr Bld: 7.5 % — ABNORMAL HIGH (ref 4.6–6.5)

## 2023-04-22 LAB — LDL CHOLESTEROL, DIRECT: Direct LDL: 65 mg/dL

## 2023-04-22 LAB — LIPID PANEL
Cholesterol: 132 mg/dL (ref 0–200)
HDL: 57.6 mg/dL (ref >39.00)
LDL Cholesterol: 61 mg/dL (ref 0–99)
NonHDL: 74.52
Total CHOL/HDL Ratio: 2
Triglycerides: 67 mg/dL (ref 0.0–149.0)
VLDL: 13.4 mg/dL (ref 0.0–40.0)

## 2023-04-22 LAB — B12 AND FOLATE PANEL
Folate: >24.2 ng/mL (ref >5.9)
Vitamin B-12: 1089 pg/mL — ABNORMAL HIGH (ref 211–911)

## 2023-04-26 ENCOUNTER — Encounter: Payer: Self-pay | Admitting: Internal Medicine

## 2023-04-26 ENCOUNTER — Ambulatory Visit: Payer: Medicare PPO | Admitting: Internal Medicine

## 2023-04-26 VITALS — BP 130/76 | HR 71 | Ht 65.0 in | Wt 160.8 lb

## 2023-04-26 DIAGNOSIS — I10 Essential (primary) hypertension: Secondary | ICD-10-CM

## 2023-04-26 DIAGNOSIS — E785 Hyperlipidemia, unspecified: Secondary | ICD-10-CM | POA: Diagnosis not present

## 2023-04-26 DIAGNOSIS — E782 Mixed hyperlipidemia: Secondary | ICD-10-CM | POA: Diagnosis not present

## 2023-04-26 DIAGNOSIS — Z7984 Long term (current) use of oral hypoglycemic drugs: Secondary | ICD-10-CM

## 2023-04-26 DIAGNOSIS — E1169 Type 2 diabetes mellitus with other specified complication: Secondary | ICD-10-CM

## 2023-04-26 MED ORDER — GLIPIZIDE 5 MG PO TABS: ORAL_TABLET | ORAL | 2 refills | Status: DC

## 2023-04-26 NOTE — Progress Notes (Signed)
Subjective:  Patient ID: [name], female    DOB: 09-26-1945  Age: 77 y.o. MRN: [id number]  CC: The primary encounter diagnosis was Primary hypertension. Diagnoses of Hyperlipidemia associated with type 2 diabetes mellitus (HCC) and DM type 2 with diabetic mixed hyperlipidemia (HCC) were also pertinent to this visit.   HPI [name] presents for  Chief Complaint  Patient presents with   Medical Management of Chronic Issues    3 month follow up     1 Type 2 DM:  A1c has risen from 6.9 in August.  BS reviewed   taking   7.5 GLIPIZIDE I N THE AM AND 2.5 MG IN THE EVENING ; AND 25 MG JARDIANCE .  Using pretzels and pnut butter/crackers to manage  DROPS IN BS  NONE BELOW THE  80's/a   Lab Results  Component Value Date   HGBA1C 7.5 (H) 04/22/2023   2) Sinusitis:  started one week ago.  Has been taking doxy  since  Dec 4  .  PROBIOTIC ADVISED TODAY  3) HISTORY OF MECHANICAL FALL DUE TO HUSBAND STOPPING SHORT IN FRONT OF HER .NO INJURIES   Outpatient Medications Prior to Visit  Medication Sig Dispense Refill   Accu-Chek Softclix Lancets lancets Use as instructed 100 each 12   atorvastatin (LIPITOR) 40 MG tablet Take 1 tablet (40 mg total) by mouth daily. 90 tablet 3   blood glucose meter kit and supplies KIT Dispense based on patient and insurance preference. Use up to four times daily as directed. (FOR ICD-9 250.00, 250.01). 1 each 0   Blood Glucose Monitoring Suppl (ACCU-CHEK GUIDE ME) w/Device KIT Use to check blood sugar up to four times daily. 1 kit 0   calcium-vitamin D (OSCAL WITH D) 250-125 MG-UNIT per tablet Take 2 tablets by mouth daily.     COLLAGEN PO daily.     cyanocobalamin (VITAMIN B12) 1000 MCG tablet Take 1 tablet (1,000 mcg total) by mouth daily.     doxycycline (VIBRAMYCIN) 100 MG capsule Take 1 capsule by mouth 2 (two) times daily.     fluticasone (FLONASE) 50 MCG/ACT nasal spray Place 2 sprays into both nostrils daily.     glucose blood (ACCU-CHEK AVIVA  PLUS) test strip USE TO TEST TWICE A DAY 100 strip 2   glucose blood (ACCU-CHEK GUIDE) test strip TEST BLOOD SUGAR TWICE DAILY 200 strip 3   JARDIANCE 25 MG TABS tablet TAKE 1 TABLET BY MOUTH EVERY DAY BEFORE BREAKFAST 90 tablet 3   lipase/protease/amylase (CREON) 36000 UNITS CPEP capsule Take 1 capsule (36,000 Units total) by mouth 3 (three) times daily with meals. May also take 1 capsule (36,000 Units total) as needed (with snacks). 240 capsule 11   losartan (COZAAR) 50 MG tablet Take 1 tablet (50 mg total) by mouth daily. 90 tablet 3   Multiple Vitamin (MULTIVITAMIN) tablet Take 1 tablet by mouth daily.     Probiotic Product (PROBIOTIC-10 PO) Take 1 capsule by mouth daily.     promethazine-dextromethorphan (PROMETHAZINE-DM) 6.25-15 MG/5ML syrup Take 5 mLs by mouth 4 (four) times daily as needed for cough.     glipiZIDE (GLUCOTROL) 5 MG tablet TAKE 1.5 TABLET BY MOUTH DAILY WITH BREAKFAST and 1/2 tablet before dinner 180 tablet 2   No facility-administered medications prior to visit.    Review of Systems;  Patient denies headache, fevers, malaise, unintentional weight loss, skin rash, eye pain, sinus congestion and sinus pain, sore throat, dysphagia,  hemoptysis , cough, dyspnea,  wheezing, chest pain, palpitations, orthopnea, edema, abdominal pain, nausea, melena, diarrhea, constipation, flank pain, dysuria, hematuria, urinary  Frequency, nocturia, numbness, tingling, seizures,  Focal weakness, Loss of consciousness,  Tremor, insomnia, depression, anxiety, and suicidal ideation.      Objective:  BP 130/76   Pulse 71   Ht 5\' 5"  (1.651 m)   Wt 160 lb 12.8 oz (72.9 kg)   SpO2 98%   BMI 26.76 kg/m   BP Readings from Last 3 Encounters:  04/26/23 130/76  01/27/23 129/77  01/19/23 110/70    Wt Readings from Last 3 Encounters:  04/26/23 160 lb 12.8 oz (72.9 kg)  01/27/23 160 lb (72.6 kg)  01/19/23 157 lb (71.2 kg)    Physical Exam Vitals reviewed.  Constitutional:      General:  She is not in acute distress.    Appearance: Normal appearance. She is normal weight. She is not ill-appearing, toxic-appearing or diaphoretic.  HENT:     Head: Normocephalic.  Eyes:     General: No scleral icterus.       Right eye: No discharge.        Left eye: No discharge.     Conjunctiva/sclera: Conjunctivae normal.  Cardiovascular:     Rate and Rhythm: Normal rate and regular rhythm.     Heart sounds: Normal heart sounds.  Pulmonary:     Effort: Pulmonary effort is normal. No respiratory distress.     Breath sounds: Normal breath sounds.  Musculoskeletal:        General: Normal range of motion.  Skin:    General: Skin is warm and dry.  Neurological:     General: No focal deficit present.     Mental Status: She is alert and oriented to person, place, and time. Mental status is at baseline.  Psychiatric:        Mood and Affect: Mood normal.        Behavior: Behavior normal.        Thought Content: Thought content normal.        Judgment: Judgment normal.    Lab Results  Component Value Date   HGBA1C 7.5 (H) 04/22/2023   HGBA1C 6.9 (H) 01/14/2023   HGBA1C 7.2 (H) 10/13/2022    Lab Results  Component Value Date   CREATININE 0.78 04/22/2023   CREATININE 0.84 01/14/2023   CREATININE 0.72 10/13/2022    Lab Results  Component Value Date   WBC 5.8 12/07/2022   HGB 12.7 12/07/2022   HCT 42.0 12/07/2022   PLT 233 12/07/2022   GLUCOSE 141 (H) 04/22/2023   CHOL 132 04/22/2023   TRIG 67.0 04/22/2023   HDL 57.60 04/22/2023   LDLDIRECT 65.0 04/22/2023   LDLCALC 61 04/22/2023   ALT 16 04/22/2023   AST 27 04/22/2023   NA 136 04/22/2023   K 4.3 04/22/2023   CL 101 04/22/2023   CREATININE 0.78 04/22/2023   BUN 13 04/22/2023   CO2 28 04/22/2023   TSH 2.02 10/13/2022   HGBA1C 7.5 (H) 04/22/2023   MICROALBUR 1.1 10/13/2022    No results found.  Assessment & Plan:  .Primary hypertension -     Comprehensive metabolic panel; Future -     Microalbumin / creatinine  urine ratio; Future  Hyperlipidemia associated with type 2 diabetes mellitus (HCC) Assessment & Plan: NOT AT GOAL FOR AGE. = CONTINUE jardiance,  CONTINUEglipizide morning dose OF 7.5 mg  increase evening dose to 5 mg before dinner .   follow up 3 months .  Lab Results  Component Value Date   HGBA1C 7.5 (H) 04/22/2023   Lab Results  Component Value Date   MICROALBUR 1.1 10/13/2022   MICROALBUR <0.7 01/06/2022     Lab Results  Component Value Date   LDLCALC 61 04/22/2023    Orders: -     Lipid panel; Future -     LDL cholesterol, direct; Future  DM type 2 with diabetic mixed hyperlipidemia (HCC) -     Comprehensive metabolic panel; Future -     Hemoglobin A1c; Future -     Microalbumin / creatinine urine ratio; Future  Other orders -     glipiZIDE; TAKE 1.5 TABLET BY MOUTH DAILY WITH BREAKFAST and 1  tablet before dinner  Dispense: 180 tablet; Refill: 2    Follow-up: Return in about 3 months (around 07/25/2023) for follow up diabetes.   Sherlene Shams, MD

## 2023-04-26 NOTE — Patient Instructions (Addendum)
FOR BETTER DIABETES CONTROL:  Continue 1.5 tablets (7.5 mg)  of glipizide in the morning .  Increase the evening (pre dinner)  dose of glipizide to 1 full tablet (5 mg )   TRY USING MILK  OR  FRUIT INSTEAD OF PRETZELS  TO PREVENT LOW BLOOD SUGARS     FOR THE COUGH:  OK TO USE ROBITUSSIN OR DELSYM  FOR DAYTIME COUGH /  THEY ARE  NON SEDATING

## 2023-04-26 NOTE — Assessment & Plan Note (Signed)
NOT AT GOAL FOR AGE. = CONTINUE jardiance,  CONTINUEglipizide morning dose OF 7.5 mg  increase evening dose to 5 mg before dinner .   follow up 3 months .     Lab Results  Component Value Date   HGBA1C 7.5 (H) 04/22/2023   Lab Results  Component Value Date   MICROALBUR 1.1 10/13/2022   MICROALBUR <0.7 01/06/2022     Lab Results  Component Value Date   LDLCALC 61 04/22/2023

## 2023-05-21 DIAGNOSIS — D2261 Melanocytic nevi of right upper limb, including shoulder: Secondary | ICD-10-CM | POA: Diagnosis not present

## 2023-05-21 DIAGNOSIS — D2262 Melanocytic nevi of left upper limb, including shoulder: Secondary | ICD-10-CM | POA: Diagnosis not present

## 2023-05-21 DIAGNOSIS — D225 Melanocytic nevi of trunk: Secondary | ICD-10-CM | POA: Diagnosis not present

## 2023-05-21 DIAGNOSIS — D2272 Melanocytic nevi of left lower limb, including hip: Secondary | ICD-10-CM | POA: Diagnosis not present

## 2023-05-21 DIAGNOSIS — Z85828 Personal history of other malignant neoplasm of skin: Secondary | ICD-10-CM | POA: Diagnosis not present

## 2023-05-21 DIAGNOSIS — L821 Other seborrheic keratosis: Secondary | ICD-10-CM | POA: Diagnosis not present

## 2023-06-14 ENCOUNTER — Other Ambulatory Visit: Payer: Self-pay | Admitting: Internal Medicine

## 2023-06-14 DIAGNOSIS — E1169 Type 2 diabetes mellitus with other specified complication: Secondary | ICD-10-CM

## 2023-06-26 ENCOUNTER — Other Ambulatory Visit: Payer: Self-pay | Admitting: Internal Medicine

## 2023-06-26 DIAGNOSIS — E782 Mixed hyperlipidemia: Secondary | ICD-10-CM

## 2023-06-28 ENCOUNTER — Ambulatory Visit (INDEPENDENT_AMBULATORY_CARE_PROVIDER_SITE_OTHER): Payer: Medicare PPO | Admitting: *Deleted

## 2023-06-28 VITALS — Ht 65.0 in | Wt 157.0 lb

## 2023-06-28 DIAGNOSIS — Z0000 Encounter for general adult medical examination without abnormal findings: Secondary | ICD-10-CM

## 2023-06-28 NOTE — Patient Instructions (Signed)
 [name] , Thank you for taking time to come for your Medicare Wellness Visit. I appreciate your ongoing commitment to your health goals. Please review the following plan we discussed and let me know if I can assist you in the future.   Referrals/Orders/Follow-Ups/Clinician Recommendations: None  This is a list of the screening recommended for you and due dates:  Health Maintenance  Topic Date Due   COVID-19 Vaccine (9 - 2024-25 season) 07/27/2023   DTaP/Tdap/Td vaccine (2 - Td or Tdap) 08/09/2023   Eye exam for diabetics  08/17/2023   Mammogram  08/20/2023   Yearly kidney health urinalysis for diabetes  10/13/2023   Hemoglobin A1C  10/21/2023   Complete foot exam   01/19/2024   Yearly kidney function blood test for diabetes  04/21/2024   Medicare Annual Wellness Visit  06/27/2024   Pneumonia Vaccine  Completed   Flu Shot  Completed   DEXA scan (bone density measurement)  Completed   Hepatitis C Screening  Completed   Zoster (Shingles) Vaccine  Completed   HPV Vaccine  Aged Out   Colon Cancer Screening  Discontinued    Advanced directives: (In Chart) A copy of your advanced directives are scanned into your chart should your provider ever need it.  Next Medicare Annual Wellness Visit scheduled for next year: Yes 06/30/24 @ 10:50

## 2023-06-28 NOTE — Progress Notes (Signed)
 Subjective:   [name] is a 78 y.o. female who presents for Medicare Annual (Subsequent) preventive examination.  Visit Complete: Virtual I connected with  [name] on 06/28/23 by a audio enabled telemedicine application and verified that I am speaking with the correct person using two identifiers. This patient declined Interactive audio and Acupuncturist. Therefore the visit was completed with audio only.   Patient Location: Home  Provider Location: Office/Clinic  I discussed the limitations of evaluation and management by telemedicine. The patient expressed understanding and agreed to proceed.  Vital Signs: Because this visit was a virtual/telehealth visit, some criteria may be missing or patient reported. Any vitals not documented were not able to be obtained and vitals that have been documented are patient reported.  Patient Medicare AWV questionnaire was completed by the patient on 06/24/23; I have confirmed that all information answered by patient is correct and no changes since this date.  Cardiac Risk Factors include: advanced age (>77men, >25 women);diabetes mellitus;dyslipidemia;hypertension     Objective:    Today's Vitals   06/28/23 1134  Weight: 157 lb (71.2 kg)  Height: 5\' 5"  (1.651 m)   Body mass index is 26.13 kg/m.     06/28/2023   11:45 AM 12/24/2022    8:25 AM 06/26/2022    1:37 PM 05/02/2021   12:03 PM 04/30/2021    9:31 AM 05/01/2020   11:19 AM 05/01/2019   11:27 AM  Advanced Directives  Does Patient Have a Medical Advance Directive? Yes Yes Yes Yes Yes Yes No  Type of Estate agent of [place];Living will  Healthcare Power of [place];Living will Healthcare Power of [place];Living will Healthcare Power of [place];Living will Healthcare Power of [place];Living will   Does patient want to make changes to medical advance directive? No - Patient declined  No - Patient declined No - Patient declined  No - Patient  declined   Copy of Healthcare Power of Attorney in Chart? Yes - validated most recent copy scanned in chart (See row information)  Yes - validated most recent copy scanned in chart (See row information) Yes - validated most recent copy scanned in chart (See row information)  No - copy requested   Would patient like information on creating a medical advance directive?       No - Guardian declined    Current Medications (verified) Outpatient Encounter Medications as of 06/28/2023  Medication Sig   Accu-Chek Softclix Lancets lancets Use as instructed   atorvastatin  (LIPITOR) 40 MG tablet Take 1 tablet (40 mg total) by mouth daily.   blood glucose meter kit and supplies KIT Dispense based on patient and insurance preference. Use up to four times daily as directed. (FOR ICD-9 250.00, 250.01).   Blood Glucose Monitoring Suppl (ACCU-CHEK GUIDE ME) w/Device KIT Use to check blood sugar up to four times daily.   calcium -vitamin D  (OSCAL WITH D) 250-125 MG-UNIT per tablet Take 2 tablets by mouth daily.   COLLAGEN PO daily.   cyanocobalamin  (VITAMIN B12) 1000 MCG tablet Take 1 tablet (1,000 mcg total) by mouth daily.   glipiZIDE  (GLUCOTROL ) 5 MG tablet TAKE 1.5 TABLET BY MOUTH DAILY WITH BREAKFAST and 1  tablet before dinner   glucose blood (ACCU-CHEK AVIVA PLUS) test strip USE TO TEST TWICE A DAY   glucose blood (ACCU-CHEK GUIDE) test strip TEST BLOOD SUGAR TWICE DAILY   JARDIANCE  25 MG TABS tablet TAKE 1 TABLET BY MOUTH EVERY DAY BEFORE BREAKFAST   lipase/protease/amylase (CREON )  36000 UNITS CPEP capsule Take 1 capsule (36,000 Units total) by mouth 3 (three) times daily with meals. May also take 1 capsule (36,000 Units total) as needed (with snacks).   losartan  (COZAAR ) 50 MG tablet TAKE 1 TABLET BY MOUTH EVERY DAY   Multiple Vitamin (MULTIVITAMIN) tablet Take 1 tablet by mouth daily.   Probiotic Product (PROBIOTIC-10 PO) Take 1 capsule by mouth daily.   [DISCONTINUED] ferrous sulfate 325 (65 FE) MG  tablet Take 325 mg by mouth daily with breakfast.   [DISCONTINUED] fluticasone (FLONASE) 50 MCG/ACT nasal spray Place 2 sprays into both nostrils daily.   [DISCONTINUED] promethazine-dextromethorphan (PROMETHAZINE-DM) 6.25-15 MG/5ML syrup Take 5 mLs by mouth 4 (four) times daily as needed for cough.   No facility-administered encounter medications on file as of 06/28/2023.    Allergies (verified) Amoxicillin and Latex   History: Past Medical History:  Diagnosis Date   COVID-19 virus infection 12/10/2020   Positive test December 04 2020    Diabetes mellitus    Dyspnea    Gastritis    High cholesterol    Hyperlipidemia    Lipitor trial was a failure   Hypertension    PMR (polymyalgia rheumatica) (HCC)    Syncope 01/15/2019   Past Surgical History:  Procedure Laterality Date   BACK SURGERY  1995   BIOPSY  12/24/2022   Procedure: BIOPSY;  Surgeon: Selena Daily, MD;  Location: [facility] ENDOSCOPY;  Service: Gastroenterology;;   CARDIAC CATHETERIZATION  12/16/2005   normal coronaries,  EF 60%   COLONOSCOPY WITH PROPOFOL  N/A 04/30/2021   Procedure: COLONOSCOPY WITH PROPOFOL ;  Surgeon: Irby Mannan, MD;  Location: [facility] ENDOSCOPY;  Service: Endoscopy;  Laterality: N/A;  DM   COLONOSCOPY WITH PROPOFOL  N/A 12/24/2022   Procedure: COLONOSCOPY WITH PROPOFOL ;  Surgeon: Selena Daily, MD;  Location[facility] ENDOSCOPY;  Service: Gastroenterology;  Laterality: N/A;   ESOPHAGOGASTRODUODENOSCOPY (EGD) WITH PROPOFOL  N/A 12/24/2022   Procedure: ESOPHAGOGASTRODUODENOSCOPY (EGD) WITH PROPOFOL ;  Surgeon: Selena Daily, MD;  Location: [facility] ENDOSCOPY;  Service: Gastroenterology;  Laterality: N/A;   GIVENS CAPSULE STUDY  12/24/2022   Procedure: GIVENS CAPSULE STUDY;  Surgeon: Selena Daily, MD;  Location: [facility] ENDOSCOPY;  Service: Gastroenterology;;   SPINE SURGERY  05/18/1993   SPINE SURGERY     bone graft    TUBAL LIGATION     Family History  Problem Relation Age of Onset   Diabetes  Mother    Heart disease Father    Stroke Father    Diabetes Father    Diabetes Brother    Cancer Brother 86       pancreatic cancer   Diabetes Brother    Diabetes Maternal Grandfather    Breast cancer Neg Hx    Social History   Socioeconomic History   Marital status: Married    Spouse name: Not on file   Number of children: Not on file   Years of education: Not on file   Highest education level: Bachelor's degree (e.g., BA, AB, BS)  Occupational History   Occupation: Teacher, adult education: [employer]    Comment: Retired 2014  Tobacco Use   Smoking status: Never   Smokeless tobacco: Never  Vaping Use   Vaping status: Never Used  Substance and Sexual Activity   Alcohol use: No   Drug use: No   Sexual activity: Yes    Partners: Male    Birth control/protection: None  Other Topics Concern   Not on file  Social History  Narrative   Not on file   Social Drivers of Health   Financial Resource Strain: Low Risk  (06/24/2023)   Overall Financial Resource Strain (CARDIA)    Difficulty of Paying Living Expenses: Not hard at all  Food Insecurity: No Food Insecurity (06/24/2023)   Hunger Vital Sign    Worried About Running Out of Food in the Last Year: Never true    Ran Out of Food in the Last Year: Never true  Transportation Needs: No Transportation Needs (06/24/2023)   PRAPARE - Administrator, Civil Service (Medical): No    Lack of Transportation (Non-Medical): No  Physical Activity: Sufficiently Active (06/24/2023)   Exercise Vital Sign    Days of Exercise per Week: 7 days    Minutes of Exercise per Session: 70 min  Stress: No Stress Concern Present (06/24/2023)   [employer] of Occupational Health - Occupational Stress Questionnaire    Feeling of Stress : Only a little  Social Connections: Socially Integrated (06/24/2023)   Social Connection and Isolation Panel [NHANES]    Frequency of Communication with Friends and Family: More than three times a week    Frequency  of Social Gatherings with Friends and Family: Three times a week    Attends Religious Services: More than 4 times per year    Active Member of Clubs or Organizations: Yes    Attends Engineer, structural: More than 4 times per year    Marital Status: Married    Tobacco Counseling Counseling given: Not Answered   Clinical Intake:  Pre-visit preparation completed: Yes  Pain : No/denies pain     BMI - recorded: 26.13 Nutritional Status: BMI 25 -29 Overweight Nutritional Risks: None Diabetes: Yes CBG done?: Yes (FBS 77 per patient) CBG resulted in Enter/ Edit results?: No Did pt. bring in CBG monitor from home?: No  How often do you need to have someone help you when you read instructions, pamphlets, or other written materials from your doctor or pharmacy?: 1 - Never  Interpreter Needed?: No  Information entered by :: R. Suleyman Ehrman LPN   Activities of Daily Living    06/24/2023    9:58 AM  In your present state of health, do you have any difficulty performing the following activities:  Hearing? 0  Vision? 0  Difficulty concentrating or making decisions? 1  Comment remembering  Walking or climbing stairs? 0  Dressing or bathing? 0  Doing errands, shopping? 0  Preparing Food and eating ? N  Using the Toilet? N  In the past six months, have you accidently leaked urine? N  Do you have problems with loss of bowel control? N  Managing your Medications? N  Managing your Finances? N  Housekeeping or managing your Housekeeping? N    Patient Care Team: Thersia Flax, MD as PCP - General (Internal Medicine) Chares Commons., MD (Rheumatology) Julianne Octave, MD as Consulting Physician (Obstetrics and Gynecology)  Indicate any recent Medical Services you may have received from other than [facility] providers in the past year (date may be approximate).     Assessment:   This is a routine wellness examination for Warner.  Hearing/Vision screen Hearing Screening -  Comments:: No issues Vision Screening - Comments:: No glasses   Goals Addressed             This Visit's Progress    Patient Stated       Wants to continue to exercise and eat better  Depression Screen    06/28/2023   11:40 AM 04/26/2023   10:36 AM 10/19/2022    2:48 PM 07/14/2022    1:31 PM 06/26/2022    1:40 PM 01/09/2022    8:30 AM 07/09/2021    2:11 PM  PHQ 2/9 Scores  PHQ - 2 Score 0 0 0 0 0 0 0  PHQ- 9 Score 2          Fall Risk    06/24/2023    9:58 AM 04/26/2023   10:36 AM 10/19/2022    2:47 PM 07/14/2022    1:31 PM 06/26/2022    1:37 PM  Fall Risk   Falls in the past year? 0 1 0 0 0  Number falls in past yr: 0 0 0 0 0  Injury with Fall? 0 0 0 0   Risk for fall due to : No Fall Risks History of fall(s) No Fall Risks No Fall Risks   Follow up Falls prevention discussed;Falls evaluation completed Falls evaluation completed Falls evaluation completed Falls evaluation completed Falls evaluation completed;Falls prevention discussed    MEDICARE RISK AT HOME: Medicare Risk at Home Any stairs in or around the home?: (Patient-Rptd) Yes If so, are there any without handrails?: (Patient-Rptd) No Home free of loose throw rugs in walkways, pet beds, electrical cords, etc?: (Patient-Rptd) Yes Adequate lighting in your home to reduce risk of falls?: (Patient-Rptd) Yes Life alert?: No Use of a cane, walker or w/c?: (Patient-Rptd) No Grab bars in the bathroom?: (Patient-Rptd) No Shower chair or bench in shower?: (Patient-Rptd) Yes Elevated toilet seat or a handicapped toilet?: (Patient-Rptd) Yes  Cognitive Function:    04/13/2017    2:56 PM 01/01/2016   12:14 PM 09/12/2014   10:41 AM  MMSE - Mini Mental State Exam  Orientation to time 5 5 5   Orientation to Place 5 5 5   Registration 3 3 3   Attention/ Calculation 5 5 5   Recall 2 3 3   Language- name 2 objects 2 2 2   Language- repeat 1 1 1   Language- follow 3 step command 3 3 3   Language- read & follow direction 1 1 1    Write a sentence 1 1 1   Copy design 1 1 1   Total score 29 30 30         06/28/2023   11:46 AM 06/26/2022    1:39 PM 05/02/2021   12:05 PM 05/01/2019   11:24 AM 04/28/2018   11:34 AM  6CIT Screen  What Year? 0 points 0 points 0 points 0 points 0 points  What month? 0 points 0 points 0 points 0 points 0 points  What time? 0 points 0 points 0 points 0 points 0 points  Count back from 20 0 points 0 points 0 points 0 points 0 points  Months in reverse 0 points 0 points 0 points 0 points 0 points  Repeat phrase 0 points 0 points 0 points 0 points   Total Score 0 points 0 points 0 points 0 points     Immunizations Immunization History  Administered Date(s) Administered   Fluad Quad(high Dose 65+) 02/02/2022   Fluad Trivalent(High Dose 65+) 01/19/2023   Hep A / Hep B 01/01/2016, 02/04/2016, 07/03/2016   Influenza Split 03/03/2012, 01/26/2013, 02/25/2015   Influenza, High Dose Seasonal PF 02/20/2016, 01/31/2018, 01/08/2019, 02/27/2021   Influenza,inj,Quad PF,6+ Mos 02/13/2014   Influenza-Unspecified 02/19/2017, 02/13/2020   PFIZER Comirnaty(Gray Top)Covid-19 Tri-Sucrose Vaccine 08/19/2020, 02/06/2022   PFIZER(Purple Top)SARS-COV-2 Vaccination 06/09/2019, 07/01/2019, 02/13/2020   Pfizer  Covid-19 Vaccine Bivalent Booster 71yrs & up 02/27/2021, 10/01/2021   Pfizer(Comirnaty)Fall Seasonal Vaccine 12 years and older 06/01/2023   Pneumococcal Conjugate-13 11/08/2013, 01/08/2019   Pneumococcal Polysaccharide-23 03/03/2012   Respiratory Syncytial Virus Vaccine,Recomb Aduvanted(Arexvy) 04/15/2022   Tdap 08/08/2013   Zoster Recombinant(Shingrix) 03/02/2018, 06/13/2018, 09/02/2022   Zoster, Live 04/20/2008    TDAP status: Up to date  Flu Vaccine status: Up to date  Pneumococcal vaccine status: Up to date  Covid-19 vaccine status: Completed vaccines  Qualifies for Shingles Vaccine? Yes   Zostavax completed  Yes Shingrix Completed Yes  Screening Tests Health Maintenance  Topic Date  Due   Medicare Annual Wellness (AWV)  06/27/2023   COVID-19 Vaccine (9 - 2024-25 season) 07/27/2023   DTaP/Tdap/Td (2 - Td or Tdap) 08/09/2023   OPHTHALMOLOGY EXAM  08/17/2023   MAMMOGRAM  08/20/2023   Diabetic kidney evaluation - Urine ACR  10/13/2023   HEMOGLOBIN A1C  10/21/2023   FOOT EXAM  01/19/2024   Diabetic kidney evaluation - eGFR measurement  04/21/2024   Pneumonia Vaccine 34+ Years old  Completed   INFLUENZA VACCINE  Completed   DEXA SCAN  Completed   Hepatitis C Screening  Completed   Zoster Vaccines- Shingrix  Completed   HPV VACCINES  Aged Out   Colonoscopy  Discontinued    Health Maintenance  Health Maintenance Due  Topic Date Due   Medicare Annual Wellness (AWV)  06/27/2023    Colorectal cancer screening: No longer required.   Mammogram status: Completed 08/2022. Repeat every year  Bone Density status: Completed 05/2017. Results reflect: Bone density results: OSTEOPOROSIS. Repeat every 2 years. Per patient thinks that her GYN orders this  Lung Cancer Screening: (Low Dose CT Chest recommended if Age 24-80 years, 20 pack-year currently smoking OR have quit w/in 15years.) does not qualify.     Additional Screening:  Hepatitis C Screening: does qualify; Completed 07/2017  Vision Screening: Recommended annual ophthalmology exams for early detection of glaucoma and other disorders of the eye. Is the patient up to date with their annual eye exam?  Yes  Who is the provider or what is the name of the office in which the patient attends annual eye exams? [facility] If pt is not established with a provider, would they like to be referred to a provider to establish care? No .   Dental Screening: Recommended annual dental exams for proper oral hygiene  Diabetic Foot Exam: Diabetic Foot Exam: Completed 01/2023  Community Resource Referral / Chronic Care Management: CRR required this visit?  No   CCM required this visit?  No     Plan:     I have personally  reviewed and noted the following in the patient's chart:   Medical and social history Use of alcohol, tobacco or illicit drugs  Current medications and supplements including opioid prescriptions. Patient is not currently taking opioid prescriptions. Functional ability and status Nutritional status Physical activity Advanced directives List of other physicians Hospitalizations, surgeries, and ER visits in previous 12 months Vitals Screenings to include cognitive, depression, and falls Referrals and appointments  In addition, I have reviewed and discussed with patient certain preventive protocols, quality metrics, and best practice recommendations. A written personalized care plan for preventive services as well as general preventive health recommendations were provided to patient.     Felicitas Horse, LPN   12/26/9145   After Visit Summary: (MyChart) Due to this being a telephonic visit, the after visit summary with patients personalized plan was offered to  patient via MyChart   Nurse Notes: See routing comments

## 2023-07-23 ENCOUNTER — Other Ambulatory Visit (INDEPENDENT_AMBULATORY_CARE_PROVIDER_SITE_OTHER): Payer: Medicare PPO

## 2023-07-23 DIAGNOSIS — E785 Hyperlipidemia, unspecified: Secondary | ICD-10-CM

## 2023-07-23 DIAGNOSIS — E1169 Type 2 diabetes mellitus with other specified complication: Secondary | ICD-10-CM

## 2023-07-23 DIAGNOSIS — I10 Essential (primary) hypertension: Secondary | ICD-10-CM

## 2023-07-23 DIAGNOSIS — E782 Mixed hyperlipidemia: Secondary | ICD-10-CM | POA: Diagnosis not present

## 2023-07-23 LAB — COMPREHENSIVE METABOLIC PANEL
ALT: 15 U/L (ref 0–35)
AST: 25 U/L (ref 0–37)
Albumin: 4.1 g/dL (ref 3.5–5.2)
Alkaline Phosphatase: 90 U/L (ref 39–117)
BUN: 16 mg/dL (ref 6–23)
CO2: 29 meq/L (ref 19–32)
Calcium: 9.3 mg/dL (ref 8.4–10.5)
Chloride: 102 meq/L (ref 96–112)
Creatinine, Ser: 0.76 mg/dL (ref 0.40–1.20)
GFR: 76.13 mL/min (ref >60.00)
Glucose, Bld: 126 mg/dL — ABNORMAL HIGH (ref 70–99)
Potassium: 4.7 meq/L (ref 3.5–5.1)
Sodium: 138 meq/L (ref 135–145)
Total Bilirubin: 0.7 mg/dL (ref 0.2–1.2)
Total Protein: 7.3 g/dL (ref 6.0–8.3)

## 2023-07-23 LAB — HEMOGLOBIN A1C: Hgb A1c MFr Bld: 7.4 % — ABNORMAL HIGH (ref 4.6–6.5)

## 2023-07-23 LAB — LIPID PANEL
Cholesterol: 126 mg/dL (ref 0–200)
HDL: 56.6 mg/dL (ref >39.00)
LDL Cholesterol: 55 mg/dL (ref 0–99)
NonHDL: 69.78
Total CHOL/HDL Ratio: 2
Triglycerides: 74 mg/dL (ref 0.0–149.0)
VLDL: 14.8 mg/dL (ref 0.0–40.0)

## 2023-07-23 LAB — LDL CHOLESTEROL, DIRECT: Direct LDL: 60 mg/dL

## 2023-07-23 LAB — MICROALBUMIN / CREATININE URINE RATIO
Creatinine,U: 68.8 mg/dL
Microalb Creat Ratio: 10.2 mg/g (ref 0.0–30.0)
Microalb, Ur: <0.7 mg/dL (ref 0.0–1.9)

## 2023-07-25 ENCOUNTER — Encounter: Payer: Self-pay | Admitting: Internal Medicine

## 2023-07-27 ENCOUNTER — Encounter: Payer: Self-pay | Admitting: Internal Medicine

## 2023-07-27 ENCOUNTER — Ambulatory Visit: Payer: Medicare PPO | Admitting: Internal Medicine

## 2023-07-27 VITALS — BP 126/80 | HR 78 | Ht 65.0 in | Wt 162.4 lb

## 2023-07-27 DIAGNOSIS — M069 Rheumatoid arthritis, unspecified: Secondary | ICD-10-CM | POA: Diagnosis not present

## 2023-07-27 DIAGNOSIS — R87610 Atypical squamous cells of undetermined significance on cytologic smear of cervix (ASC-US): Secondary | ICD-10-CM

## 2023-07-27 DIAGNOSIS — D508 Other iron deficiency anemias: Secondary | ICD-10-CM

## 2023-07-27 DIAGNOSIS — E538 Deficiency of other specified B group vitamins: Secondary | ICD-10-CM

## 2023-07-27 DIAGNOSIS — K760 Fatty (change of) liver, not elsewhere classified: Secondary | ICD-10-CM

## 2023-07-27 DIAGNOSIS — R194 Change in bowel habit: Secondary | ICD-10-CM

## 2023-07-27 DIAGNOSIS — E1169 Type 2 diabetes mellitus with other specified complication: Secondary | ICD-10-CM

## 2023-07-27 DIAGNOSIS — R140 Abdominal distension (gaseous): Secondary | ICD-10-CM | POA: Diagnosis not present

## 2023-07-27 DIAGNOSIS — Z7984 Long term (current) use of oral hypoglycemic drugs: Secondary | ICD-10-CM

## 2023-07-27 DIAGNOSIS — G8929 Other chronic pain: Secondary | ICD-10-CM

## 2023-07-27 DIAGNOSIS — E785 Hyperlipidemia, unspecified: Secondary | ICD-10-CM | POA: Diagnosis not present

## 2023-07-27 DIAGNOSIS — M25561 Pain in right knee: Secondary | ICD-10-CM | POA: Diagnosis not present

## 2023-07-27 MED ORDER — MELOXICAM 7.5 MG PO TABS: 7.5000 mg | ORAL_TABLET | Freq: Every day | ORAL | 1 refills | Status: DC

## 2023-07-27 MED ORDER — ACCU-CHEK GUIDE TEST VI STRP: ORAL_STRIP | 3 refills | Status: DC

## 2023-07-27 MED ORDER — GLIPIZIDE 5 MG PO TABS: ORAL_TABLET | ORAL | 2 refills | Status: DC

## 2023-07-27 MED ORDER — EMPAGLIFLOZIN 25 MG PO TABS
ORAL_TABLET | ORAL | 3 refills | Status: AC
Start: 2023-07-27 — End: ?

## 2023-07-27 NOTE — Assessment & Plan Note (Addendum)
 She was last seen June 2024 by Dr Allena Katz at [place] and was doing mostly well off of medicine.  CRP was normal .  She will follow up annually with rheumatology

## 2023-07-27 NOTE — Assessment & Plan Note (Signed)
 Last imaging study normal and LFT's normal .  Attributed to normalization of weight   Lab Results  Component Value Date   ALT 15 07/23/2023   AST 25 07/23/2023   ALKPHOS 90 07/23/2023   BILITOT 0.7 07/23/2023

## 2023-07-27 NOTE — Assessment & Plan Note (Addendum)
 Secondary to constipation . Change in laxative regimen,  if no improvement will recommend trial of IBGUARD

## 2023-07-27 NOTE — Assessment & Plan Note (Signed)
 Crepitus noted on exam,  without effusion or laxity.  Advised to treat as OA

## 2023-07-27 NOTE — Assessment & Plan Note (Signed)
 A1c not at goal.  Reviewed BS. Evening post prandials are elevated  ad she is having BS drop to 81 pre lunch.  Will change the dosing of glipizide to 5 mg in the am and 7.5 In the pm.  Continue jardiance  25 mg daily ,  ARB and statin   Lab Results  Component Value Date   HGBA1C 7.4 (H) 07/23/2023   Lab Results  Component Value Date   MICROALBUR <0.7 07/23/2023   MICROALBUR 1.1 10/13/2022     Lab Results  Component Value Date   LDLCALC 55 07/23/2023

## 2023-07-27 NOTE — Progress Notes (Signed)
 Subjective:  Patient ID: [name], female    DOB: 03-22-1946  Age: 78 y.o. MRN: [id number]  CC: The primary encounter diagnosis was Hyperlipidemia associated with type 2 diabetes mellitus (HCC). Diagnoses of Type 2 diabetes mellitus with other specified complication, without long-term current use of insulin (HCC), Other iron deficiency anemia, B12 deficiency, Rheumatoid arthritis involving shoulder, unspecified laterality, unspecified whether rheumatoid factor present (HCC), ASCUS with positive high risk HPV cervical on 07/01/2020, Altered bowel habits, Fatty liver disease, nonalcoholic, Functional bloating, and Chronic pain of right knee were also pertinent to this visit.   HPI [name] presents for  Chief Complaint  Patient presents with   Medical Management of Chronic Issues    3 month follow up    T2DM:  She  feels generally well,  exercising regularly but gaining weight.  Checking  blood sugars 2-3 times daily . Marland Kitchen  Fasting BS have been ranged from 127 to  173 , most are < 160   under 130 fasting and < 200 post prandially most of the time. .  Denies any recent hypoglyemic events.  Taking   medications as directed., including 7.5 mg glipizide in the am and 5 mg in the pm.  Following a carbohydrate modified diet 60% of the time .  Denies numbness, burning and tingling of extremities. Appetite is good.    2) cc: increased  fatigue for the last week.  Sleeping more,  taking afternoon naps.  No cough,  no fevers,  or body aches .  Husband has had a viral infection but did not get tested.  3)  right knee pain and swelling for the past week ,  no history of falls . Pain is aggravated by changing position the most,  during a walk  no clicking or giving way.  No swelling or warmth to the touch.  4) word finding difficulty (per husband)  no issues with taking meds,  driving,  finding car  in parking lot.    Reads daily , no trouble understanding  text,  following the plots in novels and  movies.    5) Constipation: using miralax every morning.  Has a daily BM but doesn't feel "cleaned out" and has excessive gas .  Feels pressure all the time .  Outpatient Medications Prior to Visit  Medication Sig Dispense Refill   Accu-Chek Softclix Lancets lancets Use as instructed 100 each 12   atorvastatin (LIPITOR) 40 MG tablet TAKE 1 TABLET BY MOUTH EVERY DAY 90 tablet 3   blood glucose meter kit and supplies KIT Dispense based on patient and insurance preference. Use up to four times daily as directed. (FOR ICD-9 250.00, 250.01). 1 each 0   Blood Glucose Monitoring Suppl (ACCU-CHEK GUIDE ME) w/Device KIT Use to check blood sugar up to four times daily. 1 kit 0   calcium-vitamin D (OSCAL WITH D) 250-125 MG-UNIT per tablet Take 2 tablets by mouth daily.     COLLAGEN PO daily.     cyanocobalamin (VITAMIN B12) 1000 MCG tablet Take 1 tablet (1,000 mcg total) by mouth daily.     glucose blood (ACCU-CHEK AVIVA PLUS) test strip USE TO TEST TWICE A DAY 100 strip 2   glucose blood (ACCU-CHEK GUIDE) test strip TEST BLOOD SUGAR TWICE DAILY 200 strip 3   lipase/protease/amylase (CREON) 36000 UNITS CPEP capsule Take 1 capsule (36,000 Units total) by mouth 3 (three) times daily with meals. May also take 1 capsule (36,000 Units total) as needed (  with snacks). 240 capsule 11   losartan (COZAAR) 50 MG tablet TAKE 1 TABLET BY MOUTH EVERY DAY 90 tablet 3   Multiple Vitamin (MULTIVITAMIN) tablet Take 1 tablet by mouth daily.     Probiotic Product (PROBIOTIC-10 PO) Take 1 capsule by mouth daily.     glipiZIDE (GLUCOTROL) 5 MG tablet TAKE 1.5 TABLET BY MOUTH DAILY WITH BREAKFAST and 1  tablet before dinner 180 tablet 2   JARDIANCE 25 MG TABS tablet TAKE 1 TABLET BY MOUTH EVERY DAY BEFORE BREAKFAST 90 tablet 3   No facility-administered medications prior to visit.    Review of Systems;  Patient denies headache, fevers, malaise, unintentional weight loss, skin rash, eye pain, sinus congestion and sinus  pain, sore throat, dysphagia,  hemoptysis , cough, dyspnea, wheezing, chest pain, palpitations, orthopnea, edema, abdominal pain, nausea, melena, diarrhea, constipation, flank pain, dysuria, hematuria, urinary  Frequency, nocturia, numbness, tingling, seizures,  Focal weakness, Loss of consciousness,  Tremor, insomnia, depression, anxiety, and suicidal ideation.      Objective:  BP 126/80   Pulse 78   Ht 5\' 5"  (1.651 m)   Wt 162 lb 6.4 oz (73.7 kg)   SpO2 98%   BMI 27.02 kg/m   BP Readings from Last 3 Encounters:  07/27/23 126/80  04/26/23 130/76  01/27/23 129/77    Wt Readings from Last 3 Encounters:  07/27/23 162 lb 6.4 oz (73.7 kg)  06/28/23 157 lb (71.2 kg)  04/26/23 160 lb 12.8 oz (72.9 kg)    Physical Exam Vitals reviewed.  Constitutional:      General: She is not in acute distress.    Appearance: Normal appearance. She is normal weight. She is not ill-appearing, toxic-appearing or diaphoretic.  HENT:     Head: Normocephalic.  Eyes:     General: No scleral icterus.       Right eye: No discharge.        Left eye: No discharge.     Conjunctiva/sclera: Conjunctivae normal.  Cardiovascular:     Rate and Rhythm: Normal rate and regular rhythm.     Heart sounds: Normal heart sounds.  Pulmonary:     Effort: Pulmonary effort is normal. No respiratory distress.     Breath sounds: Normal breath sounds.  Musculoskeletal:        General: Normal range of motion.     Right knee: Crepitus present.     Left knee: Normal.  Skin:    General: Skin is warm and dry.  Neurological:     General: No focal deficit present.     Mental Status: She is alert and oriented to person, place, and time. Mental status is at baseline.  Psychiatric:        Mood and Affect: Mood normal.        Behavior: Behavior normal.        Thought Content: Thought content normal.        Judgment: Judgment normal.    Lab Results  Component Value Date   HGBA1C 7.4 (H) 07/23/2023   HGBA1C 7.5 (H)  04/22/2023   HGBA1C 6.9 (H) 01/14/2023    Lab Results  Component Value Date   CREATININE 0.76 07/23/2023   CREATININE 0.78 04/22/2023   CREATININE 0.84 01/14/2023    Lab Results  Component Value Date   WBC 5.8 12/07/2022   HGB 12.7 12/07/2022   HCT 42.0 12/07/2022   PLT 233 12/07/2022   GLUCOSE 126 (H) 07/23/2023   CHOL 126 07/23/2023   TRIG  74.0 07/23/2023   HDL 56.60 07/23/2023   LDLDIRECT 60.0 07/23/2023   LDLCALC 55 07/23/2023   ALT 15 07/23/2023   AST 25 07/23/2023   NA 138 07/23/2023   K 4.7 07/23/2023   CL 102 07/23/2023   CREATININE 0.76 07/23/2023   BUN 16 07/23/2023   CO2 29 07/23/2023   TSH 2.02 10/13/2022   HGBA1C 7.4 (H) 07/23/2023   MICROALBUR <0.7 07/23/2023    No results found.  Assessment & Plan:  .Hyperlipidemia associated with type 2 diabetes mellitus (HCC) Assessment & Plan: A1c not at goal.  Reviewed BS. Evening post prandials are elevated  ad she is having BS drop to 81 pre lunch.  Will change the dosing of glipizide to 5 mg in the am and 7.5 In the pm.  Continue jardiance  25 mg daily ,  ARB and statin   Lab Results  Component Value Date   HGBA1C 7.4 (H) 07/23/2023   Lab Results  Component Value Date   MICROALBUR <0.7 07/23/2023   MICROALBUR 1.1 10/13/2022     Lab Results  Component Value Date   LDLCALC 55 07/23/2023    Orders: -     Lipid panel; Future -     LDL cholesterol, direct; Future  Type 2 diabetes mellitus with other specified complication, without long-term current use of insulin (HCC) -     Empagliflozin; TAKE 1 TABLET BY MOUTH EVERY DAY BEFORE BREAKFAST  Dispense: 90 tablet; Refill: 3 -     Comprehensive metabolic panel; Future -     Hemoglobin A1c; Future -     Hemoglobin A1c; Future -     Comprehensive metabolic panel; Future -     Lipid panel; Future  Other iron deficiency anemia -     Iron, TIBC and Ferritin Panel; Future -     CBC with Differential/Platelet; Future  B12 deficiency -     B12 and Folate  Panel; Future  Rheumatoid arthritis involving shoulder, unspecified laterality, unspecified whether rheumatoid factor present [facility]) Assessment & Plan: She was last seen June 2024 by Dr Allena Katz at [facility] and was doing mostly well off of medicine.  CRP was normal .  She will follow up annually with rheumatology    Orders: -     Sedimentation rate; Future  ASCUS with positive high risk HPV cervical on 07/01/2020  Altered bowel habits Assessment & Plan: Likely IBS given normal CT aabd/pelvis for same in 2023. Recommend  she take miralax at night or add benefiber.    Fatty liver disease, nonalcoholic Assessment & Plan: Last imaging study normal and LFT's normal .  Attributed to normalization of weight   Lab Results  Component Value Date   ALT 15 07/23/2023   AST 25 07/23/2023   ALKPHOS 90 07/23/2023   BILITOT 0.7 07/23/2023      Functional bloating Assessment & Plan: Secondary to constipation . Change in laxative regimen,  if no improvement will recommend trial of IBGUARD   Chronic pain of right knee Assessment & Plan: Crepitus noted on exam,  without effusion or laxity.  Advised to treat as OA   Other orders -     Accu-Chek Guide Test; Use as instructed  Dispense: 200 each; Refill: 3 -     glipiZIDE; TAKE 1 TABLET BY MOUTH DAILY WITH BREAKFAST and 1.5   tablet before dinner  Dispense: 180 tablet; Refill: 2 -     Meloxicam; Take 1 tablet (7.5 mg total) by mouth daily.  Dispense:  90 tablet; Refill: 1     I spent 49 minutes on the day of this face to face encounter reviewing patient's  most recent visit with gastroenterology,  prior relevant surgical and non surgical procedures, recent  labs and imaging studies, counseling on  diabetes management,  reviewing the assessment and plan with patient, and post visit ordering and reviewing of  diagnostics and therapeutics with patient  .   Follow-up: Return in about 3 months (around 10/27/2023) for follow up diabetes.   Sherlene Shams, MD

## 2023-07-27 NOTE — Assessment & Plan Note (Signed)
 Likely IBS given normal CT aabd/pelvis for same in 2023. Recommend  she take miralax at night or add benefiber.

## 2023-07-27 NOTE — Patient Instructions (Addendum)
 Try taking miralax at bedtime instead of in the morning  for your constipation. If you don't like this,  go back to morning  dosing and try  adding Benefiber with probiotics  in the evevning    For your diabetes:  Change your dose of glipizide  to 1 tablet in the morning and 1.5 tablets in the evening (before meals)  to reduce the lows at lunchtime and reduce the dinner post prandial readings.    For the gas: Try using Beano with lunch and dinner .  Use gas X if needed for excessive gas  Goal  is 60 ounces of water  by 7 pm .  Try the [place] whole wheat tortilla (25 g fiber) to add more fiber to diet    I am prescribing  7.5 mg meloxicam  daily for your  knee pain.  Do not combine with advil or aleve .  But , You can add up to 2000 mg of acetominophen (tylenol) every day safely  In divided doses (500 mg every 6 hours  Or 1000 mg every 12 hours.)   You were not anemic by July 2024 labs so you should be able to donate blood  every 4 months at the most

## 2023-08-04 ENCOUNTER — Encounter: Payer: Self-pay | Admitting: Internal Medicine

## 2023-08-04 DIAGNOSIS — Z1231 Encounter for screening mammogram for malignant neoplasm of breast: Secondary | ICD-10-CM

## 2023-08-06 NOTE — Telephone Encounter (Signed)
 I have placed the order in quick sign folder to be signed

## 2023-08-18 DIAGNOSIS — E119 Type 2 diabetes mellitus without complications: Secondary | ICD-10-CM | POA: Diagnosis not present

## 2023-08-18 DIAGNOSIS — Z79899 Other long term (current) drug therapy: Secondary | ICD-10-CM | POA: Diagnosis not present

## 2023-08-18 DIAGNOSIS — H2513 Age-related nuclear cataract, bilateral: Secondary | ICD-10-CM | POA: Diagnosis not present

## 2023-08-18 DIAGNOSIS — M0609 Rheumatoid arthritis without rheumatoid factor, multiple sites: Secondary | ICD-10-CM | POA: Diagnosis not present

## 2023-08-18 LAB — HM DIABETES EYE EXAM

## 2023-08-20 ENCOUNTER — Other Ambulatory Visit: Payer: Self-pay | Admitting: Internal Medicine

## 2023-08-20 NOTE — Telephone Encounter (Signed)
 Copied from CRM [id number]. Topic: Clinical - Medication Refill >> Aug 20, 2023  3:27 PM Lennart Pall wrote: Most Recent Primary Care Visit:  Provider: Sherlene Shams  Department: [facility]  Visit Type: OFFICE VISIT  Date: 07/27/2023  Medication: glucose blood (ACCU-CHEK GUIDE TEST) test strip  Has the patient contacted their pharmacy? Yes (Agent: If no, request that the patient contact the pharmacy for the refill. If patient does not wish to contact the pharmacy document the reason why and proceed with request.) (Agent: If yes, when and what did the pharmacy advise?)  Is this the correct pharmacy for this prescription? Yes If no, delete pharmacy and type the correct one.  This is the patient's preferred pharmacy:  [id number] [place], [place] - [address] [place] [address] Phone: [phone] Fax: [phone]   Has the prescription been filled recently? Yes  Is the patient out of the medication? Yes  Has the patient been seen for an appointment in the last year OR does the patient have an upcoming appointment? Yes  Can we respond through MyChart? Yes  Agent: Please be advised that Rx refills may take up to 3 business days. We ask that you follow-up with your pharmacy.

## 2023-08-23 ENCOUNTER — Encounter: Payer: Self-pay | Admitting: Internal Medicine

## 2023-08-23 ENCOUNTER — Other Ambulatory Visit: Payer: Self-pay | Admitting: Internal Medicine

## 2023-08-23 DIAGNOSIS — Z1231 Encounter for screening mammogram for malignant neoplasm of breast: Secondary | ICD-10-CM | POA: Diagnosis not present

## 2023-08-23 DIAGNOSIS — Z79899 Other long term (current) drug therapy: Secondary | ICD-10-CM | POA: Diagnosis not present

## 2023-08-23 LAB — HM MAMMOGRAPHY

## 2023-08-23 MED ORDER — ACCU-CHEK GUIDE TEST VI STRP: ORAL_STRIP | 3 refills | Status: DC

## 2023-10-28 ENCOUNTER — Ambulatory Visit: Admitting: Internal Medicine

## 2023-11-03 ENCOUNTER — Other Ambulatory Visit

## 2023-11-05 ENCOUNTER — Ambulatory Visit: Admitting: Internal Medicine

## 2023-11-12 ENCOUNTER — Other Ambulatory Visit (INDEPENDENT_AMBULATORY_CARE_PROVIDER_SITE_OTHER)

## 2023-11-12 DIAGNOSIS — E1169 Type 2 diabetes mellitus with other specified complication: Secondary | ICD-10-CM

## 2023-11-12 DIAGNOSIS — E538 Deficiency of other specified B group vitamins: Secondary | ICD-10-CM | POA: Diagnosis not present

## 2023-11-12 DIAGNOSIS — M069 Rheumatoid arthritis, unspecified: Secondary | ICD-10-CM | POA: Diagnosis not present

## 2023-11-12 DIAGNOSIS — E785 Hyperlipidemia, unspecified: Secondary | ICD-10-CM

## 2023-11-12 DIAGNOSIS — Z7984 Long term (current) use of oral hypoglycemic drugs: Secondary | ICD-10-CM

## 2023-11-12 DIAGNOSIS — D508 Other iron deficiency anemias: Secondary | ICD-10-CM | POA: Diagnosis not present

## 2023-11-12 LAB — CBC WITH DIFFERENTIAL/PLATELET
Basophils Absolute: 0 10*3/uL (ref 0.0–0.1)
Basophils Relative: 1 % (ref 0.0–3.0)
Eosinophils Absolute: 0.1 10*3/uL (ref 0.0–0.7)
Eosinophils Relative: 2.9 % (ref 0.0–5.0)
HCT: 46.3 % — ABNORMAL HIGH (ref 36.0–46.0)
Hemoglobin: 15.3 g/dL — ABNORMAL HIGH (ref 12.0–15.0)
Lymphocytes Relative: 27.3 % (ref 12.0–46.0)
Lymphs Abs: 0.9 10*3/uL (ref 0.7–4.0)
MCHC: 33.1 g/dL (ref 30.0–36.0)
MCV: 91.1 fl (ref 78.0–100.0)
Monocytes Absolute: 0.2 10*3/uL (ref 0.1–1.0)
Monocytes Relative: 6.8 % (ref 3.0–12.0)
Neutro Abs: 2.1 10*3/uL (ref 1.4–7.7)
Neutrophils Relative %: 62 % (ref 43.0–77.0)
Platelets: 198 10*3/uL (ref 150.0–400.0)
RBC: 5.08 Mil/uL (ref 3.87–5.11)
RDW: 14.2 % (ref 11.5–15.5)
WBC: 3.4 10*3/uL — ABNORMAL LOW (ref 4.0–10.5)

## 2023-11-12 LAB — COMPREHENSIVE METABOLIC PANEL WITH GFR
ALT: 23 U/L (ref 0–35)
AST: 32 U/L (ref 0–37)
Albumin: 4.1 g/dL (ref 3.5–5.2)
Alkaline Phosphatase: 68 U/L (ref 39–117)
BUN: 18 mg/dL (ref 6–23)
CO2: 28 meq/L (ref 19–32)
Calcium: 9.3 mg/dL (ref 8.4–10.5)
Chloride: 102 meq/L (ref 96–112)
Creatinine, Ser: 0.92 mg/dL (ref 0.40–1.20)
GFR: 60.4 mL/min (ref >60.00)
Glucose, Bld: 138 mg/dL — ABNORMAL HIGH (ref 70–99)
Potassium: 4.9 meq/L (ref 3.5–5.1)
Sodium: 137 meq/L (ref 135–145)
Total Bilirubin: 0.8 mg/dL (ref 0.2–1.2)
Total Protein: 7.1 g/dL (ref 6.0–8.3)

## 2023-11-12 LAB — LIPID PANEL
Cholesterol: 119 mg/dL (ref 0–200)
HDL: 54.9 mg/dL (ref >39.00)
LDL Cholesterol: 51 mg/dL (ref 0–99)
NonHDL: 64.58
Total CHOL/HDL Ratio: 2
Triglycerides: 70 mg/dL (ref 0.0–149.0)
VLDL: 14 mg/dL (ref 0.0–40.0)

## 2023-11-12 LAB — LDL CHOLESTEROL, DIRECT: Direct LDL: 53 mg/dL

## 2023-11-12 LAB — SEDIMENTATION RATE: Sed Rate: 5 mm/h (ref 0–30)

## 2023-11-12 LAB — HEMOGLOBIN A1C: Hgb A1c MFr Bld: 6.7 % — ABNORMAL HIGH (ref 4.6–6.5)

## 2023-11-12 LAB — B12 AND FOLATE PANEL
Folate: >23.2 ng/mL (ref >5.9)
Vitamin B-12: 1228 pg/mL — ABNORMAL HIGH (ref 211–911)

## 2023-11-13 LAB — IRON,TIBC AND FERRITIN PANEL
%SAT: 17 % (ref 16–45)
Ferritin: 22 ng/mL (ref 16–288)
Iron: 63 ug/dL (ref 45–160)
TIBC: 380 ug/dL (ref 250–450)

## 2023-11-14 ENCOUNTER — Ambulatory Visit: Payer: Self-pay | Admitting: Internal Medicine

## 2023-11-16 ENCOUNTER — Encounter: Payer: Self-pay | Admitting: Internal Medicine

## 2023-11-16 ENCOUNTER — Ambulatory Visit (INDEPENDENT_AMBULATORY_CARE_PROVIDER_SITE_OTHER)

## 2023-11-16 ENCOUNTER — Ambulatory Visit: Admitting: Internal Medicine

## 2023-11-16 ENCOUNTER — Ambulatory Visit (INDEPENDENT_AMBULATORY_CARE_PROVIDER_SITE_OTHER): Admitting: Internal Medicine

## 2023-11-16 VITALS — BP 138/82 | HR 75 | Ht 65.0 in | Wt 166.0 lb

## 2023-11-16 DIAGNOSIS — E785 Hyperlipidemia, unspecified: Secondary | ICD-10-CM

## 2023-11-16 DIAGNOSIS — M25561 Pain in right knee: Secondary | ICD-10-CM

## 2023-11-16 DIAGNOSIS — G8929 Other chronic pain: Secondary | ICD-10-CM | POA: Diagnosis not present

## 2023-11-16 DIAGNOSIS — I10 Essential (primary) hypertension: Secondary | ICD-10-CM

## 2023-11-16 DIAGNOSIS — M05719 Rheumatoid arthritis with rheumatoid factor of unspecified shoulder without organ or systems involvement: Secondary | ICD-10-CM

## 2023-11-16 DIAGNOSIS — M1711 Unilateral primary osteoarthritis, right knee: Secondary | ICD-10-CM | POA: Diagnosis not present

## 2023-11-16 DIAGNOSIS — D72819 Decreased white blood cell count, unspecified: Secondary | ICD-10-CM | POA: Diagnosis not present

## 2023-11-16 DIAGNOSIS — E1169 Type 2 diabetes mellitus with other specified complication: Secondary | ICD-10-CM | POA: Diagnosis not present

## 2023-11-16 DIAGNOSIS — K5900 Constipation, unspecified: Secondary | ICD-10-CM | POA: Diagnosis not present

## 2023-11-16 DIAGNOSIS — M7989 Other specified soft tissue disorders: Secondary | ICD-10-CM | POA: Diagnosis not present

## 2023-11-16 NOTE — Progress Notes (Signed)
 Subjective:  Patient ID: [name], female    DOB: [birth year]  Age: 78 y.o. MRN: [id number]  CC: The primary encounter diagnosis was Primary hypertension. Diagnoses of Hyperlipidemia associated with type 2 diabetes mellitus (HCC) and Type 2 diabetes mellitus with other specified complication, without long-term current use of insulin  [facility]) were also pertinent to this visit.   HPI [name] presents for  Chief Complaint  Patient presents with  . Medical Management of Chronic Issues    3 moth follow up       Outpatient Medications Prior to Visit  Medication Sig Dispense Refill  . Accu-Chek Softclix Lancets lancets Use as instructed 100 each 12  . atorvastatin  (LIPITOR) 40 MG tablet TAKE 1 TABLET BY MOUTH EVERY DAY 90 tablet 3  . blood glucose meter kit and supplies KIT Dispense based on patient and insurance preference. Use up to four times daily as directed. (FOR ICD-9 250.00, 250.01). 1 each 0  . Blood Glucose Monitoring Suppl (ACCU-CHEK GUIDE ME) w/Device KIT Use to check blood sugar up to four times daily. 1 kit 0  . calcium -vitamin D  (OSCAL WITH D) 250-125 MG-UNIT per tablet Take 2 tablets by mouth daily.    . COLLAGEN PO daily.    . cyanocobalamin  (VITAMIN B12) 1000 MCG tablet Take 1 tablet (1,000 mcg total) by mouth daily.    . empagliflozin  (JARDIANCE ) 25 MG TABS tablet TAKE 1 TABLET BY MOUTH EVERY DAY BEFORE BREAKFAST 90 tablet 3  . glipiZIDE  (GLUCOTROL ) 5 MG tablet TAKE 1 TABLET BY MOUTH DAILY WITH BREAKFAST and 1.5   tablet before dinner 180 tablet 2  . glucose blood (ACCU-CHEK AVIVA PLUS) test strip USE TO TEST TWICE A DAY 100 strip 2  . glucose blood (ACCU-CHEK GUIDE TEST) test strip 1 each by Other route as needed for other. USE TO CHECK SUGARS 2-3 TIMES DAILY. 200 strip 3  . glucose blood (ACCU-CHEK GUIDE) test strip TEST BLOOD SUGAR TWICE DAILY 200 strip 3  . lipase/protease/amylase (CREON ) 36000 UNITS CPEP capsule Take 1 capsule (36,000 Units total) by mouth 3  (three) times daily with meals. May also take 1 capsule (36,000 Units total) as needed (with snacks). 240 capsule 11  . losartan  (COZAAR ) 50 MG tablet TAKE 1 TABLET BY MOUTH EVERY DAY 90 tablet 3  . meloxicam  (MOBIC ) 7.5 MG tablet Take 1 tablet (7.5 mg total) by mouth daily. 90 tablet 1  . Multiple Vitamin (MULTIVITAMIN) tablet Take 1 tablet by mouth daily.    . Probiotic Product (PROBIOTIC-10 PO) Take 1 capsule by mouth daily.     No facility-administered medications prior to visit.    Review of Systems;  Patient denies headache, fevers, malaise, unintentional weight loss, skin rash, eye pain, sinus congestion and sinus pain, sore throat, dysphagia,  hemoptysis , cough, dyspnea, wheezing, chest pain, palpitations, orthopnea, edema, abdominal pain, nausea, melena, diarrhea, constipation, flank pain, dysuria, hematuria, urinary  Frequency, nocturia, numbness, tingling, seizures,  Focal weakness, Loss of consciousness,  Tremor, insomnia, depression, anxiety, and suicidal ideation.      Objective:  There were no vitals taken for this visit.  BP Readings from Last 3 Encounters:  07/27/23 126/80  04/26/23 130/76  01/27/23 129/77    Wt Readings from Last 3 Encounters:  07/27/23 162 lb 6.4 oz (73.7 kg)  06/28/23 157 lb (71.2 kg)  04/26/23 160 lb 12.8 oz (72.9 kg)    Physical Exam  Lab Results  Component Value Date   HGBA1C 6.7 (H)  11/12/2023   HGBA1C 7.4 (H) 07/23/2023   HGBA1C 7.5 (H) 04/22/2023    Lab Results  Component Value Date   CREATININE 0.92 11/12/2023   CREATININE 0.76 07/23/2023   CREATININE 0.78 04/22/2023    Lab Results  Component Value Date   WBC 3.4 (L) 11/12/2023   HGB 15.3 (H) 11/12/2023   HCT 46.3 (H) 11/12/2023   PLT 198.0 11/12/2023   GLUCOSE 138 (H) 11/12/2023   CHOL 119 11/12/2023   TRIG 70.0 11/12/2023   HDL 54.90 11/12/2023   LDLDIRECT 53.0 11/12/2023   LDLCALC 51 11/12/2023   ALT 23 11/12/2023   AST 32 11/12/2023   NA 137 11/12/2023   K  4.9 11/12/2023   CL 102 11/12/2023   CREATININE 0.92 11/12/2023   BUN 18 11/12/2023   CO2 28 11/12/2023   TSH 2.02 10/13/2022   HGBA1C 6.7 (H) 11/12/2023   MICROALBUR <0.7 07/23/2023    No results found.  Assessment & Plan:  .Primary hypertension  Hyperlipidemia associated with type 2 diabetes mellitus (HCC)  Type 2 diabetes mellitus with other specified complication, without long-term current use of insulin  (HCC)     I spent 34 minutes on the day of this face to face encounter reviewing patient's  most recent visit with cardiology,  nephrology,  and neurology,  prior relevant surgical and non surgical procedures, recent  labs and imaging studies, counseling on weight management,  reviewing the assessment and plan with patient, and post visit ordering and reviewing of  diagnostics and therapeutics with patient  .   Follow-up: No follow-ups on file.   Verneita LITTIE Kettering, MD

## 2023-11-16 NOTE — Progress Notes (Signed)
 Subjective:    Patient ID: PINKEY MCJUNKIN, female    DOB: 1945-11-19, 78 y.o.   MRN: [id number]  Patient here for  Chief Complaint  Patient presents with   Medical Management of Chronic Issues    3 month follow up     HPI Here for work in appt. Pt of Dr Marylynn. Was scheduled for a follow up appt with Dr Marylynn. Due to emergency - was switched to my schedule. She is followed by rheumatology for RA. She last saw rheumatology - 08/18/23 - reported worsening keen pain. Has been on meloxicam , but reported not noticing significant improvement. Was started on plaquenil. States that she has had worsening pain in her knee recently. Recent trip to [place]  and was going to the gym. Has noticed increased pain. Pain present for months now, worsening and not responding to meloxicam . Breathing stable. No chest pain reported. Takes miralax daily. Some bloating and gas. Reports - not a new issue. Discussed recent labs.    Past Medical History:  Diagnosis Date   COVID-19 virus infection 12/10/2020   Positive test December 04 2020    Diabetes mellitus    Dyspnea    Gastritis    High cholesterol    Hyperlipidemia    Lipitor trial was a failure   Hypertension    PMR (polymyalgia rheumatica) (HCC)    Syncope 01/15/2019   Past Surgical History:  Procedure Laterality Date   BACK SURGERY  1995   BIOPSY  12/24/2022   Procedure: BIOPSY;  Surgeon: Unk Corinn Skiff, MD;  Location: [facility] ENDOSCOPY;  Service: Gastroenterology;;   CARDIAC CATHETERIZATION  12/16/2005   normal coronaries,  EF 60%   COLONOSCOPY WITH PROPOFOL  N/A 04/30/2021   Procedure: COLONOSCOPY WITH PROPOFOL ;  Surgeon: Janalyn Keene NOVAK, MD;  Location: [facility] ENDOSCOPY;  Service: Endoscopy;  Laterality: N/A;  DM   COLONOSCOPY WITH PROPOFOL  N/A 12/24/2022   Procedure: COLONOSCOPY WITH PROPOFOL ;  Surgeon: Unk Corinn Skiff, MD;  Location: [facility] ENDOSCOPY;  Service: Gastroenterology;  Laterality: N/A;   ESOPHAGOGASTRODUODENOSCOPY (EGD) WITH PROPOFOL   N/A 12/24/2022   Procedure: ESOPHAGOGASTRODUODENOSCOPY (EGD) WITH PROPOFOL ;  Surgeon: Unk Corinn Skiff, MD;  Location: [facility] ENDOSCOPY;  Service: Gastroenterology;  Laterality: N/A;   GIVENS CAPSULE STUDY  12/24/2022   Procedure: GIVENS CAPSULE STUDY;  Surgeon: Unk Corinn Skiff, MD;  Location: [facility] ENDOSCOPY;  Service: Gastroenterology;;   SPINE SURGERY  05/18/1993   SPINE SURGERY     bone graft    TUBAL LIGATION     Family History  Problem Relation Age of Onset   Diabetes Mother    Heart disease Father    Stroke Father    Diabetes Father    Diabetes Brother    Cancer Brother 81       pancreatic cancer   Diabetes Brother    Diabetes Maternal Grandfather    Breast cancer Neg Hx    Social History   Socioeconomic History   Marital status: Married    Spouse name: Not on file   Number of children: Not on file   Years of education: Not on file   Highest education level: Bachelor's degree (e.g., BA, AB, BS)  Occupational History   Occupation: Teacher, adult education: [employer]    Comment: Retired 2014  Tobacco Use   Smoking status: Never   Smokeless tobacco: Never  Vaping Use   Vaping status: Never Used  Substance and Sexual Activity   Alcohol use: No   Drug use: No  Sexual activity: Yes    Partners: Male    Birth control/protection: None  Other Topics Concern   Not on file  Social History Narrative   Not on file   Social Drivers of Health   Financial Resource Strain: Low Risk  (11/12/2023)   Overall Financial Resource Strain (CARDIA)    Difficulty of Paying Living Expenses: Not hard at all  Food Insecurity: No Food Insecurity (11/12/2023)   Hunger Vital Sign    Worried About Running Out of Food in the Last Year: Never true    Ran Out of Food in the Last Year: Never true  Transportation Needs: No Transportation Needs (11/12/2023)   PRAPARE - Administrator, Civil Service (Medical): No    Lack of Transportation (Non-Medical): No  Physical Activity:  Sufficiently Active (11/12/2023)   Exercise Vital Sign    Days of Exercise per Week: 7 days    Minutes of Exercise per Session: 90 min  Stress: No Stress Concern Present (11/12/2023)   [employer] of Occupational Health - Occupational Stress Questionnaire    Feeling of Stress: Only a little  Social Connections: Socially Integrated (11/12/2023)   Social Connection and Isolation Panel    Frequency of Communication with Friends and Family: More than three times a week    Frequency of Social Gatherings with Friends and Family: More than three times a week    Attends Religious Services: More than 4 times per year    Active Member of [employer] or Organizations: Yes    Attends Engineer, structural: More than 4 times per year    Marital Status: Married     Review of Systems  Constitutional:  Negative for appetite change and unexpected weight change.  HENT:  Negative for congestion and sinus pressure.   Respiratory:  Negative for cough, chest tightness and shortness of breath.   Cardiovascular:  Negative for chest pain and palpitations.       No increased leg swelling.   Gastrointestinal:  Negative for diarrhea and vomiting.       Some occasional bloating and gas.   Genitourinary:  Negative for difficulty urinating and dysuria.  Musculoskeletal:  Negative for myalgias.       Right knee pain as outined.   Skin:  Negative for color change and rash.  Neurological:  Negative for dizziness and headaches.  Psychiatric/Behavioral:  Negative for agitation and dysphoric mood.        Objective:     BP 138/82   Pulse 75   Ht 5' 5 (1.651 m)   Wt 166 lb (75.3 kg)   SpO2 98%   BMI 27.62 kg/m  Wt Readings from Last 3 Encounters:  11/16/23 166 lb (75.3 kg)  07/27/23 162 lb 6.4 oz (73.7 kg)  06/28/23 157 lb (71.2 kg)    Physical Exam Vitals reviewed.  Constitutional:      General: She is not in acute distress.    Appearance: Normal appearance.  HENT:     Head: Normocephalic  and atraumatic.     Right Ear: External ear normal.     Left Ear: External ear normal.     Mouth/Throat:     Pharynx: No oropharyngeal exudate or posterior oropharyngeal erythema.  Eyes:     General: No scleral icterus.       Right eye: No discharge.        Left eye: No discharge.     Conjunctiva/sclera: Conjunctivae normal.  Neck:  Thyroid : No thyromegaly.  Cardiovascular:     Rate and Rhythm: Normal rate and regular rhythm.  Pulmonary:     Effort: No respiratory distress.     Breath sounds: Normal breath sounds. No wheezing.  Abdominal:     General: Bowel sounds are normal.     Palpations: Abdomen is soft.     Tenderness: There is no abdominal tenderness.  Musculoskeletal:        General: No tenderness.     Cervical back: Neck supple. No tenderness.     Comments: Increased knee pain - pain with palpation. No increased erythema.   Lymphadenopathy:     Cervical: No cervical adenopathy.  Skin:    Findings: No erythema or rash.  Neurological:     Mental Status: She is alert.  Psychiatric:        Mood and Affect: Mood normal.        Behavior: Behavior normal.         Outpatient Encounter Medications as of 11/16/2023  Medication Sig   Accu-Chek Softclix Lancets lancets Use as instructed   atorvastatin  (LIPITOR) 40 MG tablet TAKE 1 TABLET BY MOUTH EVERY DAY   blood glucose meter kit and supplies KIT Dispense based on patient and insurance preference. Use up to four times daily as directed. (FOR ICD-9 250.00, 250.01).   Blood Glucose Monitoring Suppl (ACCU-CHEK GUIDE ME) w/Device KIT Use to check blood sugar up to four times daily.   calcium -vitamin D  (OSCAL WITH D) 250-125 MG-UNIT per tablet Take 2 tablets by mouth daily.   COLLAGEN PO daily.   cyanocobalamin  (VITAMIN B12) 1000 MCG tablet Take 1 tablet (1,000 mcg total) by mouth daily.   empagliflozin  (JARDIANCE ) 25 MG TABS tablet TAKE 1 TABLET BY MOUTH EVERY DAY BEFORE BREAKFAST   glipiZIDE  (GLUCOTROL ) 5 MG tablet TAKE  1 TABLET BY MOUTH DAILY WITH BREAKFAST and 1.5   tablet before dinner   glucose blood (ACCU-CHEK AVIVA PLUS) test strip USE TO TEST TWICE A DAY   glucose blood (ACCU-CHEK GUIDE TEST) test strip 1 each by Other route as needed for other. USE TO CHECK SUGARS 2-3 TIMES DAILY.   glucose blood (ACCU-CHEK GUIDE) test strip TEST BLOOD SUGAR TWICE DAILY   lipase/protease/amylase (CREON ) 36000 UNITS CPEP capsule Take 1 capsule (36,000 Units total) by mouth 3 (three) times daily with meals. May also take 1 capsule (36,000 Units total) as needed (with snacks).   losartan  (COZAAR ) 50 MG tablet TAKE 1 TABLET BY MOUTH EVERY DAY   meloxicam  (MOBIC ) 7.5 MG tablet Take 1 tablet (7.5 mg total) by mouth daily.   Multiple Vitamin (MULTIVITAMIN) tablet Take 1 tablet by mouth daily.   Probiotic Product (PROBIOTIC-10 PO) Take 1 capsule by mouth daily.   [DISCONTINUED] ferrous sulfate 325 (65 FE) MG tablet Take 325 mg by mouth daily with breakfast.   No facility-administered encounter medications on file as of 11/16/2023.     Lab Results  Component Value Date   WBC 3.4 (L) 11/12/2023   HGB 15.3 (H) 11/12/2023   HCT 46.3 (H) 11/12/2023   PLT 198.0 11/12/2023   GLUCOSE 138 (H) 11/12/2023   CHOL 119 11/12/2023   TRIG 70.0 11/12/2023   HDL 54.90 11/12/2023   LDLDIRECT 53.0 11/12/2023   LDLCALC 51 11/12/2023   ALT 23 11/12/2023   AST 32 11/12/2023   NA 137 11/12/2023   K 4.9 11/12/2023   CL 102 11/12/2023   CREATININE 0.92 11/12/2023   BUN 18 11/12/2023   CO2  28 11/12/2023   TSH 2.02 10/13/2022   HGBA1C 6.7 (H) 11/12/2023   MICROALBUR <0.7 07/23/2023    No results found.     Assessment & Plan:  Primary hypertension Assessment & Plan: Blood pressure increased initially. Recheck did improved. Given she does not feel meloxicam  is helping, will stop. Follow pressures. Hold on changing medication. Arrange f/u with pcp - to follow up regarding her blood pressure. Reviewed recent labs.  GFR 60. Recent checks  (73-76). Will have her hold meloxicam . Monitor blood pressure. Stay hydrated. Recheck metabolic panel to confirm stable kidney function.   Orders: -     Comprehensive metabolic panel with GFR; Future -     Basic metabolic panel with GFR; Future  Hyperlipidemia associated with type 2 diabetes mellitus (HCC) Assessment & Plan: Disucssed her recent labs. Cholesterol levels look good. LDL 51. Continue lipitor.   Orders: -     Lipid panel; Future -     LDL cholesterol, direct; Future  Type 2 diabetes mellitus with other specified complication, without long-term current use of insulin  (HCC) -     Comprehensive metabolic panel with GFR; Future -     Hemoglobin A1c; Future  Chronic pain of right knee Assessment & Plan: Chronic knee pain, but has noticed worsening recently. Not improving with meloxicam . Will check xray. Further w/up pending results.   Orders: -     DG Knee 1-2 Views Right; Future  Leukopenia, unspecified type Assessment & Plan: Recheck cbc.   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 Recnetly started on plaquenil. Tolerating. Increased knee pain as outlined. Does not feel meloxicam  is helping. Will hold meloxicam . Xray knee - given recent worsening after months of pain. Keep f/u with rheumatology.    Constipation, unspecified constipation type Assessment & Plan: Takes miralax daily. Some bloating and gas. Instructed to keep bowels moving. Follow up with PCP.       Allena Hamilton, MD

## 2023-11-17 ENCOUNTER — Ambulatory Visit: Payer: Self-pay | Admitting: Internal Medicine

## 2023-11-18 ENCOUNTER — Encounter: Payer: Self-pay | Admitting: Internal Medicine

## 2023-11-22 ENCOUNTER — Encounter: Payer: Self-pay | Admitting: Internal Medicine

## 2023-11-22 NOTE — Assessment & Plan Note (Signed)
 Disucssed her recent labs. Cholesterol levels look good. LDL 51. Continue lipitor.

## 2023-11-22 NOTE — Assessment & Plan Note (Signed)
 Recheck cbc.

## 2023-11-22 NOTE — Assessment & Plan Note (Signed)
 Chronic knee pain, but has noticed worsening recently. Not improving with meloxicam . Will check xray. Further w/up pending results.

## 2023-11-22 NOTE — Assessment & Plan Note (Signed)
 Takes miralax daily. Some bloating and gas. Instructed to keep bowels moving. Follow up with PCP.

## 2023-11-22 NOTE — Assessment & Plan Note (Signed)
 Blood pressure increased initially. Recheck did improved. Given she does not feel meloxicam  is helping, will stop. Follow pressures. Hold on changing medication. Arrange f/u with pcp - to follow up regarding her blood pressure. Reviewed recent labs.  GFR 60. Recent checks (73-76). Will have her hold meloxicam . Monitor blood pressure. Stay hydrated. Recheck metabolic panel to confirm stable kidney function.

## 2023-11-22 NOTE — Assessment & Plan Note (Signed)
 Seeing rheumatology. Recnetly started on plaquenil. Tolerating. Increased knee pain as outlined. Does not feel meloxicam  is helping. Will hold meloxicam . Xray knee - given recent worsening after months of pain. Keep f/u with rheumatology.

## 2023-12-09 DIAGNOSIS — M0609 Rheumatoid arthritis without rheumatoid factor, multiple sites: Secondary | ICD-10-CM | POA: Diagnosis not present

## 2023-12-09 DIAGNOSIS — Z79899 Other long term (current) drug therapy: Secondary | ICD-10-CM | POA: Diagnosis not present

## 2023-12-09 DIAGNOSIS — M1711 Unilateral primary osteoarthritis, right knee: Secondary | ICD-10-CM | POA: Diagnosis not present

## 2023-12-22 ENCOUNTER — Ambulatory Visit: Admitting: Podiatry

## 2023-12-22 DIAGNOSIS — L600 Ingrowing nail: Secondary | ICD-10-CM

## 2023-12-22 DIAGNOSIS — M79674 Pain in right toe(s): Secondary | ICD-10-CM | POA: Diagnosis not present

## 2023-12-22 DIAGNOSIS — B351 Tinea unguium: Secondary | ICD-10-CM

## 2023-12-22 MED ORDER — NEOMYCIN-POLYMYXIN-HC 3.5-10000-1 OT SOLN: OTIC | 0 refills | Status: DC

## 2023-12-22 NOTE — Progress Notes (Signed)
 She presents today chief complaint of painful elongated toenails 1 through 5 bilaterally.  States that the hallux right has been draining and is very loose and painful with shoe gear.  Objective: Vital signs are stable she is alert oriented x 3 there is no erythema edema salines drainage odor with the exception of the hallux right which does demonstrate a cryptotic and nail plate that is loose distally and only attached proximally.  There is obvious granulation tissue present which is draining.  Assessment: Pain in limb secondary to onychomycosis and paronychia subungual granuloma hallux right.  Plan: Discussed etiology pathology conservative surgical therapies at this point in time performed a total total nail avulsion we will going allow this to grow back this was performed after local anesthetic was administered.  She tolerated procedure well it was prepped and draped as normal sterile fashion the nail was removed with very little bleeding I resected the granuloma and placed silver nitrate.  Silvadene cream Telfa pad and dry sterile compressive dressing was applied she was given both oral and written home-going instruction for the care and soaking of the toe as well as a prescription for Cortisporin Otic to be applied twice daily after soaking.  I debrided her nails 2 through 5 right 1 through 5 left

## 2023-12-22 NOTE — Patient Instructions (Signed)

## 2023-12-23 ENCOUNTER — Other Ambulatory Visit (INDEPENDENT_AMBULATORY_CARE_PROVIDER_SITE_OTHER)

## 2023-12-23 DIAGNOSIS — E1169 Type 2 diabetes mellitus with other specified complication: Secondary | ICD-10-CM

## 2023-12-23 DIAGNOSIS — E785 Hyperlipidemia, unspecified: Secondary | ICD-10-CM

## 2023-12-23 DIAGNOSIS — I10 Essential (primary) hypertension: Secondary | ICD-10-CM | POA: Diagnosis not present

## 2023-12-23 DIAGNOSIS — D72819 Decreased white blood cell count, unspecified: Secondary | ICD-10-CM | POA: Diagnosis not present

## 2023-12-23 LAB — CBC WITH DIFFERENTIAL/PLATELET
Basophils Absolute: 0 K/uL (ref 0.0–0.1)
Basophils Relative: 0.6 % (ref 0.0–3.0)
Eosinophils Absolute: 0 K/uL (ref 0.0–0.7)
Eosinophils Relative: 1.1 % (ref 0.0–5.0)
HCT: 47.6 % — ABNORMAL HIGH (ref 36.0–46.0)
Hemoglobin: 15.5 g/dL — ABNORMAL HIGH (ref 12.0–15.0)
Lymphocytes Relative: 21.8 % (ref 12.0–46.0)
Lymphs Abs: 0.9 K/uL (ref 0.7–4.0)
MCHC: 32.5 g/dL (ref 30.0–36.0)
MCV: 93.1 fl (ref 78.0–100.0)
Monocytes Absolute: 0.3 K/uL (ref 0.1–1.0)
Monocytes Relative: 6.8 % (ref 3.0–12.0)
Neutro Abs: 3 K/uL (ref 1.4–7.7)
Neutrophils Relative %: 69.7 % (ref 43.0–77.0)
Platelets: 197 K/uL (ref 150.0–400.0)
RBC: 5.11 Mil/uL (ref 3.87–5.11)
RDW: 14 % (ref 11.5–15.5)
WBC: 4.3 K/uL (ref 4.0–10.5)

## 2023-12-23 LAB — HEMOGLOBIN A1C: Hgb A1c MFr Bld: 7 % — ABNORMAL HIGH (ref 4.6–6.5)

## 2023-12-24 LAB — BASIC METABOLIC PANEL WITH GFR
BUN: 16 mg/dL (ref 6–23)
CO2: 28 meq/L (ref 19–32)
Calcium: 9.5 mg/dL (ref 8.4–10.5)
Chloride: 99 meq/L (ref 96–112)
Creatinine, Ser: 0.8 mg/dL (ref 0.40–1.20)
GFR: 71.38 mL/min (ref >60.00)
Glucose, Bld: 74 mg/dL (ref 70–99)
Potassium: 4.6 meq/L (ref 3.5–5.1)
Sodium: 138 meq/L (ref 135–145)

## 2023-12-24 LAB — LDL CHOLESTEROL, DIRECT: Direct LDL: 51 mg/dL

## 2023-12-24 LAB — COMPREHENSIVE METABOLIC PANEL WITH GFR
ALT: 18 U/L (ref 0–35)
AST: 30 U/L (ref 0–37)
Albumin: 4.2 g/dL (ref 3.5–5.2)
Alkaline Phosphatase: 75 U/L (ref 39–117)
BUN: 16 mg/dL (ref 6–23)
CO2: 28 meq/L (ref 19–32)
Calcium: 9.5 mg/dL (ref 8.4–10.5)
Chloride: 99 meq/L (ref 96–112)
Creatinine, Ser: 0.8 mg/dL (ref 0.40–1.20)
GFR: 71.38 mL/min (ref >60.00)
Glucose, Bld: 74 mg/dL (ref 70–99)
Potassium: 4.6 meq/L (ref 3.5–5.1)
Sodium: 138 meq/L (ref 135–145)
Total Bilirubin: 0.8 mg/dL (ref 0.2–1.2)
Total Protein: 6.7 g/dL (ref 6.0–8.3)

## 2023-12-24 LAB — LIPID PANEL
Cholesterol: 126 mg/dL (ref 0–200)
HDL: 66.1 mg/dL (ref >39.00)
LDL Cholesterol: 42 mg/dL (ref 0–99)
NonHDL: 59.93
Total CHOL/HDL Ratio: 2
Triglycerides: 91 mg/dL (ref 0.0–149.0)
VLDL: 18.2 mg/dL (ref 0.0–40.0)

## 2023-12-30 ENCOUNTER — Encounter: Payer: Self-pay | Admitting: Internal Medicine

## 2023-12-30 ENCOUNTER — Ambulatory Visit: Admitting: Internal Medicine

## 2023-12-30 VITALS — BP 128/68 | HR 75 | Ht 65.0 in | Wt 163.8 lb

## 2023-12-30 DIAGNOSIS — R140 Abdominal distension (gaseous): Secondary | ICD-10-CM

## 2023-12-30 DIAGNOSIS — I10 Essential (primary) hypertension: Secondary | ICD-10-CM | POA: Diagnosis not present

## 2023-12-30 DIAGNOSIS — R194 Change in bowel habit: Secondary | ICD-10-CM | POA: Diagnosis not present

## 2023-12-30 DIAGNOSIS — K8689 Other specified diseases of pancreas: Secondary | ICD-10-CM

## 2023-12-30 DIAGNOSIS — R195 Other fecal abnormalities: Secondary | ICD-10-CM

## 2023-12-30 DIAGNOSIS — D509 Iron deficiency anemia, unspecified: Secondary | ICD-10-CM | POA: Diagnosis not present

## 2023-12-30 DIAGNOSIS — E1169 Type 2 diabetes mellitus with other specified complication: Secondary | ICD-10-CM

## 2023-12-30 DIAGNOSIS — G8929 Other chronic pain: Secondary | ICD-10-CM

## 2023-12-30 DIAGNOSIS — M05719 Rheumatoid arthritis with rheumatoid factor of unspecified shoulder without organ or systems involvement: Secondary | ICD-10-CM

## 2023-12-30 DIAGNOSIS — M25561 Pain in right knee: Secondary | ICD-10-CM | POA: Diagnosis not present

## 2023-12-30 DIAGNOSIS — E785 Hyperlipidemia, unspecified: Secondary | ICD-10-CM

## 2023-12-30 MED ORDER — MELOXICAM 7.5 MG PO TABS: 7.5000 mg | ORAL_TABLET | ORAL | 1 refills | Status: AC

## 2023-12-30 NOTE — Progress Notes (Signed)
 Subjective:  Patient ID: [name], female    DOB: 10/09/1945  Age: 78 y.o. MRN: [id number]  CC: The primary encounter diagnosis was Primary hypertension. 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   HPI [name] presents for  Chief Complaint  Patient presents with   Medical Management of Chronic Issues    Follow up on hypertension   78 YR OLD FEMALE WITH TYPE 2 DM,  SERONEGATIVE RHEUMATOID ARTHRITIS /OSTEOARTHRITIS AFFECTING RIGHT KNEE   1) RA/OA: SEEN  jULY 1 BY dr Glendia. Advised to stop meloxicam  due to lack of effect and decrease in GFR from 72 to 60.  DID NOT STOP THE meloxicam .  Was Seen July  24 BY Rheum:  already taking placquenil and reduce  mobic  to ever 7.5 mg every other day . Currenlty her pain varies from day to day,  occasionally radiates to lateral thigh.  Gets stiff with inactivity.  Still walking 1 mile daily and going to silver sneakers which helps.    2) persistent fatigue.  Has poor /interrupted sleep due to pain,  in bed  from 10:30 to 6:30 ,  2 voids per night,  uses tylenol  PM intermittently   3)  T2DM:  She  feels generally well,   is exercising regularly. Checking  blood sugars less than once daily at variable times, usually only if she feels she may be having a hypoglycemic event. .  BS have been under 130 fasting and < 156 post prandially.  Denies any recent hypoglyemic events.  Taking   medications as directed. Following a carbohydrate modified diet 6 days per week. Denies numbness, burning and  tingling of extremities. Appetite is good.    4) Hypertension: patient checks blood pressure twice weekly at home.  Readings have been for the most part <130/80 at rest . Patient is following a reduced salt diet most days and is taking medications as prescribed   Lab Results  Component Value Date   VITAMINB12 1,228 (H) 11/12/2023   5) chornic constipation   FEELS BLOATED constantly even after her daily BM;s  using miralax . Stools have become skinnny?     Outpatient Medications Prior to Visit  Medication Sig Dispense Refill   Accu-Chek Softclix Lancets lancets Use as instructed 100 each 12   atorvastatin  (LIPITOR) 40 MG tablet TAKE 1 TABLET BY MOUTH EVERY DAY 90 tablet 3   blood glucose meter kit and supplies KIT Dispense based on patient and insurance preference. Use up to four times daily as directed. (FOR ICD-9 250.00, 250.01). 1 each 0   Blood Glucose Monitoring Suppl (ACCU-CHEK GUIDE ME) w/Device KIT Use to check blood sugar up to four times daily. 1 kit 0   calcium -vitamin D  (OSCAL WITH D) 250-125 MG-UNIT per tablet Take 2 tablets by mouth daily.     COLLAGEN PO daily.     cyanocobalamin  (VITAMIN B12) 1000 MCG tablet Take 1 tablet (1,000 mcg total) by mouth daily.     empagliflozin  (JARDIANCE ) 25 MG TABS tablet TAKE 1 TABLET BY MOUTH EVERY DAY BEFORE BREAKFAST 90 tablet 3   glipiZIDE  (GLUCOTROL ) 5 MG tablet TAKE 1  TABLET BY MOUTH DAILY WITH BREAKFAST and 1.5   tablet before dinner 180 tablet 2   glucose blood (ACCU-CHEK AVIVA PLUS) test strip USE TO TEST TWICE A DAY 100 strip 2   glucose blood (ACCU-CHEK GUIDE TEST) test strip 1 each by Other route as needed for other. USE TO CHECK SUGARS 2-3 TIMES DAILY. 200 strip 3   glucose blood (ACCU-CHEK GUIDE) test strip TEST BLOOD SUGAR TWICE DAILY 200 strip 3   hydroxychloroquine (PLAQUENIL) 200 MG tablet Take 200 mg by mouth. Take one tablet TWICE daily weekdays. Take one tablet ONCE daily weekends. Take with food      lipase/protease/amylase (CREON ) 36000 UNITS CPEP capsule Take 1 capsule (36,000 Units total) by mouth 3 (three) times daily with meals. May also take 1 capsule (36,000 Units total) as needed (with snacks). 240 capsule 11   losartan  (COZAAR ) 50 MG tablet TAKE 1 TABLET BY MOUTH EVERY DAY 90 tablet 3   Multiple Vitamin (MULTIVITAMIN) tablet Take 1 tablet by mouth daily.     neomycin -polymyxin-hydrocortisone (CORTISPORIN) OTIC solution 1-2 drops to toe twice daily after soaking 10 mL 0   Probiotic Product (PROBIOTIC-10 PO) Take 1 capsule by mouth daily.     meloxicam  (MOBIC ) 7.5 MG tablet Take 1 tablet (7.5 mg total) by mouth daily. 90 tablet 1   No facility-administered medications prior to visit.    Review of Systems;  Patient denies headache, fevers, malaise, unintentional weight loss, skin rash, eye pain, sinus congestion and sinus pain, sore throat, dysphagia,  hemoptysis , cough, dyspnea, wheezing, chest pain, palpitations, orthopnea, edema, abdominal pain, nausea, melena, diarrhea, constipation, flank pain, dysuria, hematuria, urinary  Frequency, nocturia, numbness, tingling, seizures,  Focal weakness, Loss of consciousness,  Tremor, insomnia, depression, anxiety, and suicidal ideation.      Objective:  BP 128/68   Pulse 75   Ht 5' 5 (1.651 m)   Wt 163 lb 12.8 oz (74.3 kg)   SpO2 98%   BMI 27.26 kg/m   BP Readings from Last 3 Encounters:  12/30/23 128/68  11/16/23 138/82  07/27/23 126/80    Wt Readings from Last 3 Encounters:  12/30/23 163 lb 12.8 oz (74.3 kg)  11/16/23 166 lb (75.3 kg)  07/27/23 162 lb 6.4 oz (73.7 kg)    Physical Exam Vitals reviewed.  Constitutional:      General: She is not in acute distress.    Appearance: Normal appearance. She is normal weight. She is not ill-appearing, toxic-appearing or diaphoretic.  HENT:     Head: Normocephalic.  Eyes:     General: No scleral icterus.       Right eye: No discharge.        Left eye: No discharge.      Conjunctiva/sclera: Conjunctivae normal.  Cardiovascular:     Rate and Rhythm: Normal rate and regular rhythm.     Heart sounds: Normal heart sounds.  Pulmonary:     Effort: Pulmonary effort is normal. No respiratory distress.     Breath sounds: Normal breath sounds.  Musculoskeletal:        General: Normal range of motion.  Skin:    General: Skin is warm and dry.  Neurological:     General: No focal deficit present.     Mental Status: She is alert and oriented to person, place, and time. Mental status is at baseline.  Psychiatric:        Mood and Affect: Mood normal.        Behavior: Behavior  normal.        Thought Content: Thought content normal.        Judgment: Judgment normal.     Lab Results  Component Value Date   HGBA1C 7.0 (H) 12/23/2023   HGBA1C 6.7 (H) 11/12/2023   HGBA1C 7.4 (H) 07/23/2023    Lab Results  Component Value Date   CREATININE 0.80 12/23/2023   CREATININE 0.80 12/23/2023   CREATININE 0.92 11/12/2023    Lab Results  Component Value Date   WBC 4.3 12/23/2023   HGB 15.5 (H) 12/23/2023   HCT 47.6 (H) 12/23/2023   PLT 197.0 12/23/2023   GLUCOSE 74 12/23/2023   GLUCOSE 74 12/23/2023   CHOL 126 12/23/2023   TRIG 91.0 12/23/2023   HDL 66.10 12/23/2023   LDLDIRECT 51.0 12/23/2023   LDLCALC 42 12/23/2023   ALT 18 12/23/2023   AST 30 12/23/2023   NA 138 12/23/2023   NA 138 12/23/2023   K 4.6 12/23/2023   K 4.6 12/23/2023   CL 99 12/23/2023   CL 99 12/23/2023   CREATININE 0.80 12/23/2023   CREATININE 0.80 12/23/2023   BUN 16 12/23/2023   BUN 16 12/23/2023   CO2 28 12/23/2023   CO2 28 12/23/2023   TSH 2.02 10/13/2022   HGBA1C 7.0 (H) 12/23/2023   MICROALBUR <0.7 07/23/2023    No results found.  Assessment & Plan:  .Primary hypertension Assessment & Plan: Well controlled on current regimen of losartan  50 mg daily . Renal function stable, no changes today.  Lab Results  Component Value Date   CREATININE 0.80 12/23/2023    CREATININE 0.80 12/23/2023   Lab Results  Component Value Date   NA 138 12/23/2023   NA 138 12/23/2023   K 4.6 12/23/2023   K 4.6 12/23/2023   CL 99 12/23/2023   CL 99 12/23/2023   CO2 28 12/23/2023   CO2 28 12/23/2023     Orders: -     Comprehensive metabolic panel with GFR; Future  Hyperlipidemia associated with type 2 diabetes mellitus (HCC) Assessment & Plan: A1c is  at goal.  Reviewed BS. No changes today   Lab Results  Component Value Date   HGBA1C 7.0 (H) 12/23/2023   Lab Results  Component Value Date   MICROALBUR <0.7 07/23/2023   MICROALBUR 0.81 11/18/2011     Lab Results  Component Value Date   LDLCALC 42 12/23/2023    Orders: -     Lipid panel; Future -     LDL cholesterol, direct; Future  Type 2 diabetes mellitus with other specified complication, without long-term current use of insulin  (HCC) -     Comprehensive metabolic panel with GFR; Future -     Hemoglobin A1c; Future  Functional bloating -     CT ABDOMEN PELVIS W CONTRAST; Future  Change in stool caliber -     CT ABDOMEN PELVIS W CONTRAST; Future  Pancreatic insufficiency Assessment & Plan: Has reduced use of Creon   to twice daily  with no diarrhea or weight loss.  Now having bloating and constipation   Iron deficiency anemia, unspecified iron deficiency anemia type Assessment & Plan: Resolved,  Appears to have iatrogenic due to frequent blood donation complicated by  B12 deficiency.  May resume blood donation not more than every 4 months   Lab Results  Component Value Date   WBC 4.3 12/23/2023   HGB 15.5 (H) 12/23/2023   HCT 47.6 (H) 12/23/2023   MCV 93.1 12/23/2023   PLT 197.0  12/23/2023      Chronic pain of right knee Assessment & Plan: Ccontinue every other day meloxicam     Altered bowel habits Assessment & Plan: Likely IBS given normal CT abd/pelvis for same in 2022. Repeat CT given presence of ovaries o and need to rule out ovarian nass,    Rheumatoid  arthritis involving shoulder with positive rheumatoid factor, unspecified laterality [facility]) Assessment & Plan: Managed with placquenil by Dr Tobie at [place] most recent CRP was normal .  She will continue  follow up semi annually with rheumatology    Lab Results  Component Value Date   ESRSEDRATE 5 11/12/2023       Other orders -     Meloxicam ; Take 1 tablet (7.5 mg total) by mouth every other day.  Dispense: 45 tablet; Refill: 1   I personally spent a total of 30 minutes in the care of the patient today including preparing to see the patient, getting/reviewing separately obtained history, performing a medically appropriate exam/evaluation, counseling and educating, placing orders, documenting clinical information in the EHR, and independently interpreting results .  Follow-up: Return in about 3 months (around 04/05/2024) for follow up diabetes.   Verneita LITTIE Kettering, MD

## 2023-12-30 NOTE — Assessment & Plan Note (Signed)
 Likely IBS given normal CT abd/pelvis for same in 2022. Repeat CT given presence of ovaries o and need to rule out ovarian nass,

## 2023-12-30 NOTE — Assessment & Plan Note (Signed)
 Has reduced use of Creon   to twice daily  with no diarrhea or weight loss.  Now having bloating and constipation

## 2023-12-30 NOTE — Patient Instructions (Addendum)
  You can take 3000 mg of acetominophen (tylenol ) every day safely  In divided doses (750 mg  every 6 hours  Or 1000 mg every 8 hours.)   I recommend taking 1000 mg tylenol  at  bedtime ,  instead of tylenol  PM  If the fatigue doesn't improve with better nighttime pain management,  I will order the home sleep stody    STOP ANY PRODUCTS CONTAINING DIPENHYDRAMINE , it causes sedation and dry mouth   You are not anemic. You can definitely donate blood   not more than every 4 months   Repeat labs in late November/early December   Ok to continue  nightly miralax  but consider adding Benefiber daily  CT abd and plevis ordered to investigate the persistent bloating and skinny BMs

## 2023-12-30 NOTE — Assessment & Plan Note (Signed)
 Well controlled on current regimen of losartan  50 mg daily . Renal function stable, no changes today.  Lab Results  Component Value Date   CREATININE 0.80 12/23/2023   CREATININE 0.80 12/23/2023   Lab Results  Component Value Date   NA 138 12/23/2023   NA 138 12/23/2023   K 4.6 12/23/2023   K 4.6 12/23/2023   CL 99 12/23/2023   CL 99 12/23/2023   CO2 28 12/23/2023   CO2 28 12/23/2023

## 2023-12-30 NOTE — Assessment & Plan Note (Signed)
 A1c is  at goal.  Reviewed BS. No changes today   Lab Results  Component Value Date   HGBA1C 7.0 (H) 12/23/2023   Lab Results  Component Value Date   MICROALBUR <0.7 07/23/2023   MICROALBUR 0.81 11/18/2011     Lab Results  Component Value Date   LDLCALC 42 12/23/2023

## 2023-12-30 NOTE — Assessment & Plan Note (Signed)
 Resolved,  Appears to have iatrogenic due to frequent blood donation complicated by  B12 deficiency.  May resume blood donation not more than every 4 months   Lab Results  Component Value Date   WBC 4.3 12/23/2023   HGB 15.5 (H) 12/23/2023   HCT 47.6 (H) 12/23/2023   MCV 93.1 12/23/2023   PLT 197.0 12/23/2023

## 2023-12-30 NOTE — Assessment & Plan Note (Signed)
 Managed with placquenil by Dr Tobie at [place] most recent CRP was normal .  She will continue  follow up semi annually with rheumatology    Lab Results  Component Value Date   ESRSEDRATE 5 11/12/2023

## 2023-12-30 NOTE — Assessment & Plan Note (Signed)
 Ccontinue every other day meloxicam 

## 2023-12-31 ENCOUNTER — Emergency Department

## 2023-12-31 ENCOUNTER — Emergency Department
Admission: EM | Admit: 2023-12-31 | Discharge: 2024-01-01 | Disposition: A | Discharge status: Home / Self Care | Attending: Emergency Medicine | Admitting: Emergency Medicine

## 2023-12-31 ENCOUNTER — Other Ambulatory Visit: Payer: Self-pay

## 2023-12-31 DIAGNOSIS — S022XXA Fracture of nasal bones, initial encounter for closed fracture: Secondary | ICD-10-CM | POA: Diagnosis not present

## 2023-12-31 DIAGNOSIS — S80212A Abrasion, left knee, initial encounter: Secondary | ICD-10-CM | POA: Diagnosis not present

## 2023-12-31 DIAGNOSIS — S01111A Laceration without foreign body of right eyelid and periocular area, initial encounter: Secondary | ICD-10-CM | POA: Insufficient documentation

## 2023-12-31 DIAGNOSIS — S0181XA Laceration without foreign body of other part of head, initial encounter: Secondary | ICD-10-CM | POA: Diagnosis not present

## 2023-12-31 DIAGNOSIS — S0990XA Unspecified injury of head, initial encounter: Secondary | ICD-10-CM | POA: Diagnosis not present

## 2023-12-31 DIAGNOSIS — I10 Essential (primary) hypertension: Secondary | ICD-10-CM | POA: Diagnosis not present

## 2023-12-31 DIAGNOSIS — E119 Type 2 diabetes mellitus without complications: Secondary | ICD-10-CM | POA: Insufficient documentation

## 2023-12-31 DIAGNOSIS — Y9302 Activity, running: Secondary | ICD-10-CM | POA: Insufficient documentation

## 2023-12-31 DIAGNOSIS — S22019A Unspecified fracture of first thoracic vertebra, initial encounter for closed fracture: Secondary | ICD-10-CM | POA: Diagnosis not present

## 2023-12-31 DIAGNOSIS — R040 Epistaxis: Secondary | ICD-10-CM | POA: Insufficient documentation

## 2023-12-31 DIAGNOSIS — W010XXA Fall on same level from slipping, tripping and stumbling without subsequent striking against object, initial encounter: Secondary | ICD-10-CM | POA: Insufficient documentation

## 2023-12-31 DIAGNOSIS — R29890 Loss of height: Secondary | ICD-10-CM | POA: Diagnosis not present

## 2023-12-31 DIAGNOSIS — S0033XA Contusion of nose, initial encounter: Secondary | ICD-10-CM | POA: Diagnosis not present

## 2023-12-31 DIAGNOSIS — S0083XA Contusion of other part of head, initial encounter: Secondary | ICD-10-CM | POA: Diagnosis not present

## 2023-12-31 DIAGNOSIS — T148XXA Other injury of unspecified body region, initial encounter: Secondary | ICD-10-CM

## 2023-12-31 LAB — BASIC METABOLIC PANEL WITH GFR
Anion gap: 9 (ref 5–15)
BUN: 21 mg/dL (ref 8–23)
CO2: 23 mmol/L (ref 22–32)
Calcium: 8.7 mg/dL — ABNORMAL LOW (ref 8.9–10.3)
Chloride: 101 mmol/L (ref 98–111)
Creatinine, Ser: 0.76 mg/dL (ref 0.44–1.00)
GFR, Estimated: >60 mL/min (ref >60)
Glucose, Bld: 149 mg/dL — ABNORMAL HIGH (ref 70–99)
Potassium: 4.4 mmol/L (ref 3.5–5.1)
Sodium: 133 mmol/L — ABNORMAL LOW (ref 135–145)

## 2023-12-31 LAB — CBC WITH DIFFERENTIAL/PLATELET
Abs Immature Granulocytes: 0.03 K/uL (ref 0.00–0.07)
Basophils Absolute: 0 K/uL (ref 0.0–0.1)
Basophils Relative: 0 %
Eosinophils Absolute: 0 K/uL (ref 0.0–0.5)
Eosinophils Relative: 0 %
HCT: 44.8 % (ref 36.0–46.0)
Hemoglobin: 15.5 g/dL — ABNORMAL HIGH (ref 12.0–15.0)
Immature Granulocytes: 0 %
Lymphocytes Relative: 11 %
Lymphs Abs: 1 K/uL (ref 0.7–4.0)
MCH: 31.4 pg (ref 26.0–34.0)
MCHC: 34.6 g/dL (ref 30.0–36.0)
MCV: 90.7 fL (ref 80.0–100.0)
Monocytes Absolute: 0.5 K/uL (ref 0.1–1.0)
Monocytes Relative: 6 %
Neutro Abs: 7.2 K/uL (ref 1.7–7.7)
Neutrophils Relative %: 83 %
Platelets: 186 K/uL (ref 150–400)
RBC: 4.94 MIL/uL (ref 3.87–5.11)
RDW: 13.3 % (ref 11.5–15.5)
WBC: 8.8 K/uL (ref 4.0–10.5)
nRBC: 0 % (ref 0.0–0.2)

## 2023-12-31 LAB — TYPE AND SCREEN
ABO/RH(D): A POS
Antibody Screen: NEGATIVE

## 2023-12-31 MED ORDER — CYCLOBENZAPRINE HCL 10 MG PO TABS
10.0000 mg | ORAL_TABLET | Freq: Once | ORAL | Status: AC
  Administered 2023-12-31: 10 mg via ORAL
  Filled 2023-12-31: qty 1

## 2023-12-31 MED ORDER — CYCLOBENZAPRINE HCL 5 MG PO TABS: 5.0000 mg | ORAL_TABLET | Freq: Three times a day (TID) | ORAL | 0 refills | Status: AC | PRN

## 2023-12-31 MED ORDER — OXIDIZED CELLULOSE EX PADS
1.0000 | MEDICATED_PAD | Freq: Once | CUTANEOUS | Status: DC
  Filled 2023-12-31: qty 1

## 2023-12-31 MED ORDER — GELATIN ABSORBABLE 12-7 MM EX MISC
1.0000 | Freq: Once | CUTANEOUS | Status: AC
  Administered 2024-01-01: 1 via TOPICAL
  Filled 2023-12-31: qty 1

## 2023-12-31 MED ORDER — IBUPROFEN 800 MG PO TABS
800.0000 mg | ORAL_TABLET | Freq: Once | ORAL | Status: AC
  Administered 2023-12-31: 800 mg via ORAL
  Filled 2023-12-31: qty 1

## 2023-12-31 MED ORDER — LIDOCAINE-EPINEPHRINE 2 %-1:100000 IJ SOLN
1.7000 mL | Freq: Once | INTRAMUSCULAR | Status: AC
  Administered 2023-12-31: 1.7 mL
  Filled 2023-12-31: qty 1.7

## 2023-12-31 MED ORDER — TRANEXAMIC ACID 1000 MG/10ML IV SOLN
500.0000 mg | Freq: Once | INTRAVENOUS | Status: AC
  Administered 2023-12-31: 500 mg via TOPICAL
  Filled 2023-12-31: qty 10

## 2023-12-31 MED ORDER — BACITRACIN ZINC 500 UNIT/GM EX OINT
TOPICAL_OINTMENT | Freq: Once | CUTANEOUS | Status: AC
  Administered 2023-12-31: 1 via TOPICAL
  Filled 2023-12-31: qty 1.8

## 2023-12-31 MED ORDER — OXYMETAZOLINE HCL 0.05 % NA SOLN
1.0000 | Freq: Once | NASAL | Status: AC
  Administered 2023-12-31: 1 via NASAL
  Filled 2023-12-31: qty 30

## 2023-12-31 MED ORDER — ONDANSETRON 4 MG PO TBDP
4.0000 mg | ORAL_TABLET | Freq: Once | ORAL | Status: AC
  Administered 2023-12-31: 4 mg via ORAL
  Filled 2023-12-31: qty 1

## 2023-12-31 MED ORDER — LIDOCAINE-EPINEPHRINE (PF) 2 %-1:200000 IJ SOLN
20.0000 mL | Freq: Once | INTRAMUSCULAR | Status: AC
  Administered 2023-12-31: 20 mL via INTRADERMAL
  Filled 2023-12-31: qty 20

## 2023-12-31 NOTE — ED Provider Notes (Signed)
 [facility] Emergency Department Provider Note     Event Date/Time   First MD Initiated Contact with Patient 12/31/23 1536     (approximate)   History   Fall   HPI  [name] is a 78 y.o. female with a history of HTN, HLD, DM, and spinal stenosis, presenting to the ED following a mechanical fall.  Patient describes she was running underneath the garage door at home, but apparently tripped falling forward landing primarily on her face.  She presents with multiple abrasions or lacerations to the face patient also endorsing some left knee pain.  Patient denied any frank LOC, weakness, or paralysis.  No reported blood thinner use.  Patient presents to the ED for evaluation of injuries.  Tetanus that is currently in known.  Physical Exam   Triage Vital Signs: ED Triage Vitals [12/31/23 1506]  Encounter Vitals Group     BP (!) 178/99     Girls Systolic BP Percentile      Girls Diastolic BP Percentile      Boys Systolic BP Percentile      Boys Diastolic BP Percentile      Pulse Rate 93     Resp 18     Temp 97.9 F (36.6 C)     Temp Source Oral     SpO2 98 %     Weight      Height      Head Circumference      Peak Flow      Pain Score 5     Pain Loc      Pain Education      Exclude from Growth Chart     Most recent vital signs: Vitals:   12/31/23 1715 12/31/23 2157  BP: (!) 152/80 135/78  Pulse: 66 70  Resp: 18 18  Temp:  97.7 F (36.5 C)  SpO2: 97% 96%    General Awake, no distress. NAD HEENT NCAT, except for abrasions to the central face and a laceration over the right brow. PERRL. EOMI. No rhinorrhea.  Some epistaxis noted to the left nare.  No nasal septal hematoma appreciated.  Mucous membranes are moist.  CV:  Good peripheral perfusion.  RRR RESP:  Normal effort.  CTA ABD:  No distention.  MSK:  AROM of all extremities.  Left knee with a large abrasion of the patella.  No joint effusion noted.   ED Results / Procedures /  Treatments   Labs (all labs ordered are listed, but only abnormal results are displayed) Labs Reviewed  BASIC METABOLIC PANEL WITH GFR - Abnormal; Notable for the following components:      Result Value   Sodium 133 (*)    Glucose, Bld 149 (*)    Calcium  8.7 (*)    All other components within normal limits  CBC WITH DIFFERENTIAL/PLATELET - Abnormal; Notable for the following components:   Hemoglobin 15.5 (*)    All other components within normal limits  TYPE AND SCREEN     EKG   RADIOLOGY  I personally viewed and evaluated these images as part of my medical decision making, as well as reviewing the written report by the radiologist.  ED Provider Interpretation: Evidence of a nondisplaced nasal bone fracture.  No intracranial abnormality noted.  CT Head Wo Contrast Result Date: 12/31/2023 CLINICAL DATA:  fall EXAM: CT HEAD WITHOUT CONTRAST CT MAXILLOFACIAL WITHOUT CONTRAST CT CERVICAL SPINE WITHOUT CONTRAST TECHNIQUE: Multidetector CT imaging of the head, cervical  spine, and maxillofacial structures were performed using the standard protocol without intravenous contrast. Multiplanar CT image reconstructions of the cervical spine and maxillofacial structures were also generated. RADIATION DOSE REDUCTION: This exam was performed according to the departmental dose-optimization program which includes automated exposure control, adjustment of the mA and/or kV according to patient size and/or use of iterative reconstruction technique. COMPARISON:  CT head 01/05/2019. FINDINGS: CT HEAD FINDINGS Brain: No evidence of acute infarction, hemorrhage, hydrocephalus, extra-axial collection or mass lesion/mass effect. Vascular: No hyperdense vessel or unexpected calcification. Skull: No acute fracture.  Right forehead contusion. CT MAXILLOFACIAL FINDINGS Osseous: Suspected nondisplaced fracture of the anterior bony nasal septum (for example see series 9, image 29). No other fracture identified. The TMJs  are located. Orbits: Negative. No traumatic or inflammatory finding. Sinuses: Clear sinuses.  Opacified left nasal cavity, likely blood Soft tissues: Right forehead contusion/laceration.  Nasal contusion. CT CERVICAL SPINE FINDINGS Alignment: Normal. Skull base and vertebrae: No evidence of acute fracture. Mild height loss of T1, which appears remote. Soft tissues and spinal canal: No prevertebral fluid or swelling. No visible canal hematoma. Disc levels: Moderate to severe multilevel degenerative change including degenerative disc disease with disc height loss and endplate spurring. Facet and uncovertebral hypertrophy with varying degrees of neural foraminal stenosis. Upper chest: Visualized lung apices are clear. IMPRESSION: 1. Suspected nondisplaced fracture of the anterior bony nasal septum. Opacified adjacent left nasal cavity, likely hemorrhage. 2. Nasal and right forehead contusion. 3. No evidence of acute intracranial abnormality. 4. No evidence of acute fracture or traumatic malalignment in the cervical spine. Remote appearing T1 fracture with mild height loss. Electronically Signed   By: Gilmore GORMAN Molt M.D.   On: 12/31/2023 16:28   CT Cervical Spine Wo Contrast Result Date: 12/31/2023 CLINICAL DATA:  fall EXAM: CT HEAD WITHOUT CONTRAST CT MAXILLOFACIAL WITHOUT CONTRAST CT CERVICAL SPINE WITHOUT CONTRAST TECHNIQUE: Multidetector CT imaging of the head, cervical spine, and maxillofacial structures were performed using the standard protocol without intravenous contrast. Multiplanar CT image reconstructions of the cervical spine and maxillofacial structures were also generated. RADIATION DOSE REDUCTION: This exam was performed according to the departmental dose-optimization program which includes automated exposure control, adjustment of the mA and/or kV according to patient size and/or use of iterative reconstruction technique. COMPARISON:  CT head 01/05/2019. FINDINGS: CT HEAD FINDINGS Brain: No  evidence of acute infarction, hemorrhage, hydrocephalus, extra-axial collection or mass lesion/mass effect. Vascular: No hyperdense vessel or unexpected calcification. Skull: No acute fracture.  Right forehead contusion. CT MAXILLOFACIAL FINDINGS Osseous: Suspected nondisplaced fracture of the anterior bony nasal septum (for example see series 9, image 29). No other fracture identified. The TMJs are located. Orbits: Negative. No traumatic or inflammatory finding. Sinuses: Clear sinuses.  Opacified left nasal cavity, likely blood Soft tissues: Right forehead contusion/laceration.  Nasal contusion. CT CERVICAL SPINE FINDINGS Alignment: Normal. Skull base and vertebrae: No evidence of acute fracture. Mild height loss of T1, which appears remote. Soft tissues and spinal canal: No prevertebral fluid or swelling. No visible canal hematoma. Disc levels: Moderate to severe multilevel degenerative change including degenerative disc disease with disc height loss and endplate spurring. Facet and uncovertebral hypertrophy with varying degrees of neural foraminal stenosis. Upper chest: Visualized lung apices are clear. IMPRESSION: 1. Suspected nondisplaced fracture of the anterior bony nasal septum. Opacified adjacent left nasal cavity, likely hemorrhage. 2. Nasal and right forehead contusion. 3. No evidence of acute intracranial abnormality. 4. No evidence of acute fracture or traumatic malalignment in the cervical spine.  Remote appearing T1 fracture with mild height loss. Electronically Signed   By: Gilmore GORMAN Molt M.D.   On: 12/31/2023 16:28   CT Maxillofacial Wo Contrast Result Date: 12/31/2023 CLINICAL DATA:  fall EXAM: CT HEAD WITHOUT CONTRAST CT MAXILLOFACIAL WITHOUT CONTRAST CT CERVICAL SPINE WITHOUT CONTRAST TECHNIQUE: Multidetector CT imaging of the head, cervical spine, and maxillofacial structures were performed using the standard protocol without intravenous contrast. Multiplanar CT image reconstructions of the  cervical spine and maxillofacial structures were also generated. RADIATION DOSE REDUCTION: This exam was performed according to the departmental dose-optimization program which includes automated exposure control, adjustment of the mA and/or kV according to patient size and/or use of iterative reconstruction technique. COMPARISON:  CT head 01/05/2019. FINDINGS: CT HEAD FINDINGS Brain: No evidence of acute infarction, hemorrhage, hydrocephalus, extra-axial collection or mass lesion/mass effect. Vascular: No hyperdense vessel or unexpected calcification. Skull: No acute fracture.  Right forehead contusion. CT MAXILLOFACIAL FINDINGS Osseous: Suspected nondisplaced fracture of the anterior bony nasal septum (for example see series 9, image 29). No other fracture identified. The TMJs are located. Orbits: Negative. No traumatic or inflammatory finding. Sinuses: Clear sinuses.  Opacified left nasal cavity, likely blood Soft tissues: Right forehead contusion/laceration.  Nasal contusion. CT CERVICAL SPINE FINDINGS Alignment: Normal. Skull base and vertebrae: No evidence of acute fracture. Mild height loss of T1, which appears remote. Soft tissues and spinal canal: No prevertebral fluid or swelling. No visible canal hematoma. Disc levels: Moderate to severe multilevel degenerative change including degenerative disc disease with disc height loss and endplate spurring. Facet and uncovertebral hypertrophy with varying degrees of neural foraminal stenosis. Upper chest: Visualized lung apices are clear. IMPRESSION: 1. Suspected nondisplaced fracture of the anterior bony nasal septum. Opacified adjacent left nasal cavity, likely hemorrhage. 2. Nasal and right forehead contusion. 3. No evidence of acute intracranial abnormality. 4. No evidence of acute fracture or traumatic malalignment in the cervical spine. Remote appearing T1 fracture with mild height loss. Electronically Signed   By: Gilmore GORMAN Molt M.D.   On: 12/31/2023  16:28     PROCEDURES:  Critical Care performed: No  .Laceration Repair  Date/Time: 12/31/2023 5:17 PM  Performed by: Loyd Candida LULLA Aldona, PA-C Authorized by: Loyd Candida LULLA Aldona, PA-C   Consent:    Consent obtained:  Verbal   Consent given by:  Patient   Risks, benefits, and alternatives were discussed: yes     Risks discussed:  Pain and poor cosmetic result   Alternatives discussed:  No treatment Universal protocol:    Imaging studies available: yes     Site/side marked: yes   Anesthesia:    Anesthesia method:  Local infiltration   Local anesthetic:  Lidocaine  2% WITH epi Laceration details:    Location:  Face   Face location:  Forehead   Length (cm):  1.3   Depth (mm):  5 Pre-procedure details:    Preparation:  Patient was prepped and draped in usual sterile fashion Exploration:    Limited defect created (wound extended): no     Hemostasis achieved with:  Epinephrine    Contaminated: no   Treatment:    Area cleansed with:  Povidone-iodine and saline   Amount of cleaning:  Standard   Irrigation solution:  Sterile saline   Irrigation volume:  10   Irrigation method:  Tap   Visualized foreign bodies/material removed: yes     Debridement:  None   Undermining:  None   Scar revision: no   Skin repair:  Repair method:  Sutures   Suture size:  6-0 and 5-0   Suture material:  Nylon   Suture technique:  Simple interrupted   Number of sutures:  2 Approximation:    Approximation:  Close Repair type:    Repair type:  Simple Post-procedure details:    Dressing:  Antibiotic ointment   Procedure completion:  Tolerated well, no immediate complications    MEDICATIONS ORDERED IN ED: Medications  gelatin adsorbable (GELFOAM/SURGIFOAM) sponge 12-7 mm 1 each (has no administration in time range)  gelatin adsorbable (GELFOAM/SURGIFOAM) sponge 12-7 mm 1 each (has no administration in time range)  oxymetazoline  (AFRIN) 0.05 % nasal spray 1 spray (1 spray Left Nare  Given by Other 12/31/23 1808)  lidocaine -EPINEPHrine  (XYLOCAINE  W/EPI) 2 %-1:100000 (with pres) injection 1.7 mL (1.7 mLs Infiltration Given by Other 12/31/23 1809)  ondansetron  (ZOFRAN -ODT) disintegrating tablet 4 mg (4 mg Oral Given 12/31/23 1814)  ibuprofen  (ADVIL ) tablet 800 mg (800 mg Oral Given 12/31/23 1813)  cyclobenzaprine  (FLEXERIL ) tablet 10 mg (10 mg Oral Given 12/31/23 1815)  bacitracin  ointment (1 Application Topical Given 12/31/23 1818)  tranexamic acid  (CYKLOKAPRON ) injection 500 mg (500 mg Topical Given 12/31/23 1903)  lidocaine -EPINEPHrine  (XYLOCAINE  W/EPI) 2 %-1:200000 (PF) injection 20 mL (20 mLs Intradermal Given by Other 12/31/23 2301)     IMPRESSION / MDM / ASSESSMENT AND PLAN / ED COURSE  I reviewed the triage vital signs and the nursing notes.                              Differential diagnosis includes, but is not limited to, closed head injury, max face fracture, cervical fracture, cervical radiculopathy, myalgias, contusions, abrasions, lacerations  Patient's presentation is most consistent with acute presentation with potential threat to life or bodily function.  Patient's diagnosis is consistent with mechanical fall resulting in facial abrasions and nondisplaced nasal septal fracture with spontaneous nosebleed.  Patient exam is overall reassuring.  Several attempts to stem the bleeding from the left nare, resulting [place] application of Afrin follow-up TXA followed by lidocaine  with epi on a Surgicel sponge.  Ultimately hemostasis was achieved after observation in the ED.  Patient will be discharged home with prescriptions for cyclobenzaprine . Patient is to follow up with ENT or her primary provider and 5 days for suture removal and nasal packing removal, as needed or otherwise directed. Patient is given ED precautions to return to the ED for any worsening or new symptoms.     FINAL CLINICAL IMPRESSION(S) / ED DIAGNOSES   Final diagnoses:  Minor head injury, initial  encounter  Abrasion  Closed fracture of nasal bone, initial encounter  Left-sided nosebleed     Rx / DC Orders   ED Discharge Orders          Ordered    cyclobenzaprine  (FLEXERIL ) 5 MG tablet  3 times daily PRN        12/31/23 1751             Note:  This document was prepared using Dragon voice recognition software and may include unintentional dictation errors.    Loyd Candida LULLA Aldona, PA-C 01/01/24 0014    Levander Slate, MD 01/03/24 [phone]

## 2023-12-31 NOTE — ED Notes (Addendum)
 PA Menshew in room assessign pts nosebleed.

## 2023-12-31 NOTE — ED Triage Notes (Addendum)
 Pt to ED via POV from home. Pt ambulatory to triage. Pt reports was running under the garage door before it closed and tripped and fell forward landing on face. Pt has laceration to bridge of nose, above right eye brow, left knee and bilateral elbows. Nose clamp placed. Laceration on head wrapped. NO blood thinners. NO LOC.

## 2023-12-31 NOTE — ED Notes (Signed)
 PA Menshew back in room reassessing nosebleed

## 2023-12-31 NOTE — ED Notes (Addendum)
 L knee irrigated with normal saline, bacitracin  applied and wrapped with dry rolled gauze. L forearm irrigated with normal saline, bacitracin  applied, and wrapped with dry rolled gauze. R elbow irrigated with normal saline, bacitracin  applied, and wrapped with dry rolled gauze. Nose irrigated with normal saline and bacitracin  ointment applied.

## 2023-12-31 NOTE — ED Notes (Addendum)
 See triage note  Presents s/p fall  States she was trying to beat her garage door  Tripped and fell face first Laceration noted bridge of nose  and over right eye Abrasion to left knee and bilateral knees

## 2023-12-31 NOTE — Discharge Instructions (Addendum)
 Your exam, CT scans, are normal and reassuring.  No signs of a serious injury related to your fall.  You do have multiple abrasions to the face and extremities as well as evidence of a nondisplaced nasal fracture.  That fractures created a nosebleed on the left side.  We packed the left side of the nose with a surgical sponge to help with the bleeding.  You should see ENT next week for packing removal.  Rest with the head elevated, and avoid blowing the nose for the next 48 hours.  Follow-up with your primary provider or ENT as suggested.  Cover the abrasions with a thin veil of antibiotic ointment.

## 2024-01-02 ENCOUNTER — Encounter: Payer: Self-pay | Admitting: Internal Medicine

## 2024-01-03 NOTE — Telephone Encounter (Signed)
 Spoke with pt and scheduled her to see Dr. Jimmy on Wednesday.

## 2024-01-05 ENCOUNTER — Encounter: Payer: Self-pay | Admitting: Internal Medicine

## 2024-01-05 ENCOUNTER — Ambulatory Visit: Admitting: Internal Medicine

## 2024-01-05 VITALS — BP 130/84 | HR 85 | Temp 97.6°F | Ht 65.0 in | Wt 164.0 lb

## 2024-01-05 DIAGNOSIS — S0181XD Laceration without foreign body of other part of head, subsequent encounter: Secondary | ICD-10-CM

## 2024-01-05 DIAGNOSIS — R040 Epistaxis: Secondary | ICD-10-CM | POA: Diagnosis not present

## 2024-01-05 DIAGNOSIS — S022XXD Fracture of nasal bones, subsequent encounter for fracture with routine healing: Secondary | ICD-10-CM | POA: Diagnosis not present

## 2024-01-05 DIAGNOSIS — S022XXA Fracture of nasal bones, initial encounter for closed fracture: Secondary | ICD-10-CM | POA: Diagnosis not present

## 2024-01-05 DIAGNOSIS — S0181XA Laceration without foreign body of other part of head, initial encounter: Secondary | ICD-10-CM | POA: Insufficient documentation

## 2024-01-05 DIAGNOSIS — H903 Sensorineural hearing loss, bilateral: Secondary | ICD-10-CM | POA: Diagnosis not present

## 2024-01-05 DIAGNOSIS — J340 Abscess, furuncle and carbuncle of nose: Secondary | ICD-10-CM | POA: Diagnosis not present

## 2024-01-05 NOTE — Assessment & Plan Note (Signed)
 Healed well Sutures removed without incident Discussed home care

## 2024-01-05 NOTE — Assessment & Plan Note (Signed)
 Going to ENT today to have plugs removed Multiple abrasions are scabbed---no dressings needed

## 2024-01-05 NOTE — Progress Notes (Signed)
 Subjective:    Patient ID: [name], female    DOB: 09/17/1945, 78 y.o.   MRN: [id number]  HPI Here for suture removal Seen in ER on 8/15 after a fall in her neighbor's garage (he was away and trying to close it and rush out) Mostly facial abrasions but did have septa fracture seen on CT scan Did have repair with 2 sutures placed above her right eye  Feels okay Lots of bruising, etc  No headache  Harder to read--due to swelling  Current Outpatient Medications on File Prior to Visit  Medication Sig Dispense Refill   Accu-Chek Softclix Lancets lancets Use as instructed 100 each 12   atorvastatin  (LIPITOR) 40 MG tablet TAKE 1 TABLET BY MOUTH EVERY DAY 90 tablet 3   blood glucose meter kit and supplies KIT Dispense based on patient and insurance preference. Use up to four times daily as directed. (FOR ICD-9 250.00, 250.01). 1 each 0   Blood Glucose Monitoring Suppl (ACCU-CHEK GUIDE ME) w/Device KIT Use to check blood sugar up to four times daily. 1 kit 0   calcium -vitamin D  (OSCAL WITH D) 250-125 MG-UNIT per tablet Take 2 tablets by mouth daily.     COLLAGEN PO daily.     cyanocobalamin  (VITAMIN B12) 1000 MCG tablet Take 1 tablet (1,000 mcg total) by mouth daily.     cyclobenzaprine  (FLEXERIL ) 5 MG tablet Take 1 tablet (5 mg total) by mouth 3 (three) times daily as needed. 9 tablet 0   empagliflozin  (JARDIANCE ) 25 MG TABS tablet TAKE 1 TABLET BY MOUTH EVERY DAY BEFORE BREAKFAST 90 tablet 3   glipiZIDE  (GLUCOTROL ) 5 MG tablet TAKE 1 TABLET BY MOUTH DAILY WITH BREAKFAST and 1.5   tablet before dinner 180 tablet 2   glucose blood (ACCU-CHEK AVIVA PLUS) test strip USE TO TEST TWICE A DAY 100 strip 2   glucose blood (ACCU-CHEK GUIDE TEST) test strip 1 each by Other route as needed for other. USE TO CHECK SUGARS 2-3 TIMES DAILY. 200 strip 3   glucose blood (ACCU-CHEK GUIDE) test strip TEST BLOOD SUGAR TWICE DAILY 200 strip 3   hydroxychloroquine (PLAQUENIL) 200 MG tablet Take 200 mg by  mouth. Take one tablet TWICE daily weekdays. Take one tablet ONCE daily weekends. Take with food     lipase/protease/amylase (CREON ) 36000 UNITS CPEP capsule Take 1 capsule (36,000 Units total) by mouth 3 (three) times daily with meals. May also take 1 capsule (36,000 Units total) as needed (with snacks). 240 capsule 11   losartan  (COZAAR ) 50 MG tablet TAKE 1 TABLET BY MOUTH EVERY DAY 90 tablet 3   meloxicam  (MOBIC ) 7.5 MG tablet Take 1 tablet (7.5 mg total) by mouth every other day. 45 tablet 1   Multiple Vitamin (MULTIVITAMIN) tablet Take 1 tablet by mouth daily.     Probiotic Product (PROBIOTIC-10 PO) Take 1 capsule by mouth daily.     neomycin -polymyxin-hydrocortisone (CORTISPORIN) OTIC solution 1-2 drops to toe twice daily after soaking 10 mL 0   [DISCONTINUED] ferrous sulfate 325 (65 FE) MG tablet Take 325 mg by mouth daily with breakfast.     No current facility-administered medications on file prior to visit.    Allergies  Allergen Reactions   Amoxicillin Hives   Latex Rash    Past Medical History:  Diagnosis Date   COVID-19 virus infection 12/10/2020   Positive test December 04 2020    Diabetes mellitus    Dyspnea    Gastritis    High cholesterol  Hyperlipidemia    Lipitor trial was a failure   Hypertension    PMR (polymyalgia rheumatica) (HCC)    Syncope 01/15/2019    Past Surgical History:  Procedure Laterality Date   BACK SURGERY  1995   BIOPSY  12/24/2022   Procedure: BIOPSY;  Surgeon: Unk Corinn Skiff, MD;  Location: [facility] ENDOSCOPY;  Service: Gastroenterology;;   CARDIAC CATHETERIZATION  12/16/2005   normal coronaries,  EF 60%   COLONOSCOPY WITH PROPOFOL  N/A 04/30/2021   Procedure: COLONOSCOPY WITH PROPOFOL ;  Surgeon: Janalyn Keene NOVAK, MD;  Location: [facility] ENDOSCOPY;  Service: Endoscopy;  Laterality: N/A;  DM   COLONOSCOPY WITH PROPOFOL  N/A 12/24/2022   Procedure: COLONOSCOPY WITH PROPOFOL ;  Surgeon: Unk Corinn Skiff, MD;  Location: [facility] ENDOSCOPY;   Service: Gastroenterology;  Laterality: N/A;   ESOPHAGOGASTRODUODENOSCOPY (EGD) WITH PROPOFOL  N/A 12/24/2022   Procedure: ESOPHAGOGASTRODUODENOSCOPY (EGD) WITH PROPOFOL ;  Surgeon: Unk Corinn Skiff, MD;  Location: [facility] ENDOSCOPY;  Service: Gastroenterology;  Laterality: N/A;   GIVENS CAPSULE STUDY  12/24/2022   Procedure: GIVENS CAPSULE STUDY;  Surgeon: Unk Corinn Skiff, MD;  Location: [facility] ENDOSCOPY;  Service: Gastroenterology;;   SPINE SURGERY  05/18/1993   SPINE SURGERY     bone graft    TUBAL LIGATION      Family History  Problem Relation Age of Onset   Diabetes Mother    Heart disease Father    Stroke Father    Diabetes Father    Diabetes Brother    Cancer Brother 67       pancreatic cancer   Diabetes Brother    Diabetes Maternal Grandfather    Breast cancer Neg Hx     Social History   Socioeconomic History   Marital status: Married    Spouse name: Not on file   Number of children: Not on file   Years of education: Not on file   Highest education level: Bachelor's degree (e.g., BA, AB, BS)  Occupational History   Occupation: Teacher, adult education: [employer]    Comment: Retired 2014  Tobacco Use   Smoking status: Never   Smokeless tobacco: Never  Vaping Use   Vaping status: Never Used  Substance and Sexual Activity   Alcohol use: No   Drug use: No   Sexual activity: Yes    Partners: Male    Birth control/protection: None  Other Topics Concern   Not on file  Social History Narrative   Not on file   Social Drivers of Health   Financial Resource Strain: Low Risk  (11/12/2023)   Overall Financial Resource Strain (CARDIA)    Difficulty of Paying Living Expenses: Not hard at all  Food Insecurity: No Food Insecurity (11/12/2023)   Hunger Vital Sign    Worried About Running Out of Food in the Last Year: Never true    Ran Out of Food in the Last Year: Never true  Transportation Needs: No Transportation Needs (11/12/2023)   PRAPARE - Scientist, research (physical sciences) (Medical): No    Lack of Transportation (Non-Medical): No  Physical Activity: Sufficiently Active (11/12/2023)   Exercise Vital Sign    Days of Exercise per Week: 7 days    Minutes of Exercise per Session: 90 min  Stress: No Stress Concern Present (11/12/2023)   [employer] of Occupational Health - Occupational Stress Questionnaire    Feeling of Stress: Only a little  Social Connections: Socially Integrated (11/12/2023)   Social Connection and Isolation Panel  Frequency of Communication with Friends and Family: More than three times a week    Frequency of Social Gatherings with Friends and Family: More than three times a week    Attends Religious Services: More than 4 times per year    Active Member of [employer] or Organizations: Yes    Attends Engineer, structural: More than 4 times per year    Marital Status: Married  Catering manager Violence: Not At Risk (06/28/2023)   Humiliation, Afraid, Rape, and Kick questionnaire    Fear of Current or Ex-Partner: No    Emotionally Abused: No    Physically Abused: No    Sexually Abused: No   Review of Systems No N/V     Objective:   Physical Exam Constitutional:      General: She is not in acute distress. HENT:     Head:     Comments: Scattered ecchymoses on face and abrasions Laceration above right eye--well apposed and clean eschar    Nose:     Comments: Plug still in Neurological:     Mental Status: She is alert.            Assessment & Plan:

## 2024-01-20 ENCOUNTER — Ambulatory Visit
Admission: RE | Admit: 2024-01-20 | Discharge: 2024-01-20 | Disposition: A | Discharge status: Home / Self Care | Source: Ambulatory Visit | Attending: Internal Medicine | Admitting: Internal Medicine

## 2024-01-20 DIAGNOSIS — K5900 Constipation, unspecified: Secondary | ICD-10-CM | POA: Diagnosis not present

## 2024-01-20 DIAGNOSIS — K5730 Diverticulosis of large intestine without perforation or abscess without bleeding: Secondary | ICD-10-CM | POA: Diagnosis not present

## 2024-01-20 DIAGNOSIS — R140 Abdominal distension (gaseous): Secondary | ICD-10-CM | POA: Diagnosis not present

## 2024-01-20 DIAGNOSIS — R195 Other fecal abnormalities: Secondary | ICD-10-CM | POA: Diagnosis not present

## 2024-01-20 MED ORDER — IOHEXOL 300 MG/ML  SOLN
100.0000 mL | Freq: Once | INTRAMUSCULAR | Status: AC | PRN
Start: 2024-01-20 — End: 2024-01-20
  Administered 2024-01-20: 100 mL via INTRAVENOUS

## 2024-01-25 ENCOUNTER — Ambulatory Visit: Payer: Self-pay | Admitting: Internal Medicine

## 2024-01-25 DIAGNOSIS — R140 Abdominal distension (gaseous): Secondary | ICD-10-CM

## 2024-01-25 DIAGNOSIS — R194 Change in bowel habit: Secondary | ICD-10-CM

## 2024-01-26 ENCOUNTER — Ambulatory Visit: Admitting: Podiatry

## 2024-01-26 DIAGNOSIS — L600 Ingrowing nail: Secondary | ICD-10-CM

## 2024-01-26 NOTE — Progress Notes (Signed)
 She presents today for follow-up of her nail avulsion hallux right.  She denies fever chills nausea by muscle aches and pains.  Right after the procedure she had fallen at her neighbor's house and injuring her face.  A lot of bruising but she is doing well now.  Objective: No erythema edema cellulitis drainage odor appears to be healing very nicely hallux right.  Assessment: Well-healing surgical toe hallux right.  Plan: Follow-up with us  on an as-needed basis.

## 2024-02-03 DIAGNOSIS — J340 Abscess, furuncle and carbuncle of nose: Secondary | ICD-10-CM | POA: Diagnosis not present

## 2024-02-03 DIAGNOSIS — S022XXD Fracture of nasal bones, subsequent encounter for fracture with routine healing: Secondary | ICD-10-CM | POA: Diagnosis not present

## 2024-02-03 DIAGNOSIS — R040 Epistaxis: Secondary | ICD-10-CM | POA: Diagnosis not present

## 2024-02-03 DIAGNOSIS — H903 Sensorineural hearing loss, bilateral: Secondary | ICD-10-CM | POA: Diagnosis not present

## 2024-02-16 ENCOUNTER — Other Ambulatory Visit: Payer: Self-pay | Admitting: Internal Medicine

## 2024-02-17 NOTE — Telephone Encounter (Signed)
 Copied from CRM [id number]. Topic: Clinical - Prescription Issue >> Feb 17, 2024  1:24 PM Alfonso HERO wrote: Reason for CRM: Patient calling checking on the status of her refill request lipase/protease/amylase (CREON ) 36000 UNITS CPEP capsule she is going out of town in the morning and needs it before she leaves.

## 2024-03-02 DIAGNOSIS — M0609 Rheumatoid arthritis without rheumatoid factor, multiple sites: Secondary | ICD-10-CM | POA: Diagnosis not present

## 2024-03-02 DIAGNOSIS — M5441 Lumbago with sciatica, right side: Secondary | ICD-10-CM | POA: Diagnosis not present

## 2024-03-02 DIAGNOSIS — M1711 Unilateral primary osteoarthritis, right knee: Secondary | ICD-10-CM | POA: Diagnosis not present

## 2024-03-02 DIAGNOSIS — Z79899 Other long term (current) drug therapy: Secondary | ICD-10-CM | POA: Diagnosis not present

## 2024-03-22 NOTE — Progress Notes (Unsigned)
 Patient not seen.

## 2024-03-23 ENCOUNTER — Encounter: Payer: Self-pay | Admitting: Internal Medicine

## 2024-04-03 ENCOUNTER — Other Ambulatory Visit

## 2024-04-04 ENCOUNTER — Other Ambulatory Visit (INDEPENDENT_AMBULATORY_CARE_PROVIDER_SITE_OTHER)

## 2024-04-04 ENCOUNTER — Telehealth: Payer: Self-pay | Admitting: Internal Medicine

## 2024-04-04 DIAGNOSIS — E1169 Type 2 diabetes mellitus with other specified complication: Secondary | ICD-10-CM

## 2024-04-04 DIAGNOSIS — E785 Hyperlipidemia, unspecified: Secondary | ICD-10-CM | POA: Diagnosis not present

## 2024-04-04 DIAGNOSIS — I10 Essential (primary) hypertension: Secondary | ICD-10-CM | POA: Diagnosis not present

## 2024-04-04 LAB — HEMOGLOBIN A1C: Hgb A1c MFr Bld: 7.1 % — ABNORMAL HIGH (ref 4.6–6.5)

## 2024-04-04 LAB — LIPID PANEL
Cholesterol: 130 mg/dL (ref 0–200)
HDL: 68 mg/dL (ref >39.00)
LDL Cholesterol: 48 mg/dL (ref 0–99)
NonHDL: 62.42
Total CHOL/HDL Ratio: 2
Triglycerides: 71 mg/dL (ref 0.0–149.0)
VLDL: 14.2 mg/dL (ref 0.0–40.0)

## 2024-04-04 LAB — LDL CHOLESTEROL, DIRECT: Direct LDL: 49 mg/dL

## 2024-04-04 LAB — COMPREHENSIVE METABOLIC PANEL WITH GFR
ALT: 13 U/L (ref 0–35)
AST: 25 U/L (ref 0–37)
Albumin: 4.3 g/dL (ref 3.5–5.2)
Alkaline Phosphatase: 83 U/L (ref 39–117)
BUN: 13 mg/dL (ref 6–23)
CO2: 27 meq/L (ref 19–32)
Calcium: 9.4 mg/dL (ref 8.4–10.5)
Chloride: 102 meq/L (ref 96–112)
Creatinine, Ser: 0.79 mg/dL (ref 0.40–1.20)
GFR: 72.32 mL/min (ref >60.00)
Glucose, Bld: 110 mg/dL — ABNORMAL HIGH (ref 70–99)
Potassium: 5 meq/L (ref 3.5–5.1)
Sodium: 137 meq/L (ref 135–145)
Total Bilirubin: 0.9 mg/dL (ref 0.2–1.2)
Total Protein: 6.9 g/dL (ref 6.0–8.3)

## 2024-04-04 NOTE — Telephone Encounter (Signed)
 Pt dropped off a note for Dr Marylynn. It is in the color folder up front

## 2024-04-04 NOTE — Telephone Encounter (Signed)
 Placed in red folder for review

## 2024-04-06 ENCOUNTER — Ambulatory Visit: Admitting: Internal Medicine

## 2024-04-06 ENCOUNTER — Encounter: Payer: Self-pay | Admitting: Internal Medicine

## 2024-04-06 VITALS — BP 142/84 | HR 72 | Ht 65.0 in | Wt 162.2 lb

## 2024-04-06 DIAGNOSIS — K8689 Other specified diseases of pancreas: Secondary | ICD-10-CM | POA: Diagnosis not present

## 2024-04-06 DIAGNOSIS — M48062 Spinal stenosis, lumbar region with neurogenic claudication: Secondary | ICD-10-CM

## 2024-04-06 DIAGNOSIS — E785 Hyperlipidemia, unspecified: Secondary | ICD-10-CM

## 2024-04-06 DIAGNOSIS — M545 Low back pain, unspecified: Secondary | ICD-10-CM

## 2024-04-06 DIAGNOSIS — M05719 Rheumatoid arthritis with rheumatoid factor of unspecified shoulder without organ or systems involvement: Secondary | ICD-10-CM

## 2024-04-06 DIAGNOSIS — M205X2 Other deformities of toe(s) (acquired), left foot: Secondary | ICD-10-CM | POA: Insufficient documentation

## 2024-04-06 DIAGNOSIS — M205X1 Other deformities of toe(s) (acquired), right foot: Secondary | ICD-10-CM | POA: Insufficient documentation

## 2024-04-06 DIAGNOSIS — E1169 Type 2 diabetes mellitus with other specified complication: Secondary | ICD-10-CM | POA: Diagnosis not present

## 2024-04-06 DIAGNOSIS — G8929 Other chronic pain: Secondary | ICD-10-CM | POA: Diagnosis not present

## 2024-04-06 MED ORDER — GLIPIZIDE 5 MG PO TABS: ORAL_TABLET | ORAL | 2 refills | Status: AC

## 2024-04-06 MED ORDER — LOSARTAN POTASSIUM 100 MG PO TABS: 100.0000 mg | ORAL_TABLET | Freq: Every day | ORAL | 1 refills | Status: AC

## 2024-04-06 NOTE — Assessment & Plan Note (Signed)
 Now with persistent  back pain that has not improved with back extension exercises. S he is seeing [facility] ; has not had PT or repeat imaging othern than plain films which note haedware from 2019 in place .  She does not have radiation to legs.  Continue meloxiam 7.5 mg every other day and enocuarged to increase tylenol  to 1000 mg tid.  Using flexeril  at bedtime

## 2024-04-06 NOTE — Assessment & Plan Note (Signed)
 Managed with placquenil by Dr Tobie at [place] most recent CRP was normal .  She will continue  follow up semi annually with rheumatology    Lab Results  Component Value Date   ESRSEDRATE 5 11/12/2023

## 2024-04-06 NOTE — Patient Instructions (Addendum)
 For your knee and back pain :  max out your tylenol  doe every day. . See below   You can take 3000 mg of acetominophen (tylenol ) every day safely  In divided doses (750 mg  every 6 hours  Or 1000 mg every 8 hours.)     For your blood pressure:  I am  increasing losartan  dose to 100 mg daily.   Send me readings in a few weeks for your hoe BP

## 2024-04-06 NOTE — Progress Notes (Signed)
 Subjective:  Patient ID: [name], female    DOB: 1945-09-05  Age: 78 y.o. MRN: [id number]  CC: The primary encounter diagnosis was Hyperlipidemia associated with type 2 diabetes mellitus (HCC). 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   HPI [name] presents for  Chief Complaint  Patient presents with   Medical Management of Chronic Issues    3 month follow on diabetes   1) Type 2 DM:   She  feels generally well,  But is not  exercising regularly   due to persistent low back pain and right knee pain  now managed  by various orthopedics groups .  She is checking  blood sugars  1-2 times daily . .  BS have been under 140 fasting and < 160 most of the time post  prandially.  Denies any recent hypoglyemic events.  Taking   medications as directed. Following a carbohydrate modified diet 6 days per week. Denies numbness, burning and tingling of extremities. Appetite is good.    2) HTN:  Patient is taking her medications as prescribed and notes no adverse effects.  Home BP readings have been done about once per week and are  generally > 130/80 .  She is avoiding added salt in her diet and walking regularly about 3 times per week for exercise  . Losartan  50 mg   3) chronic low back pain;   prior lumbar surgery at  4 levels in 1995.. pain has been severe for several weeks .  worried that her severe pain aggravated by  sitting   .  Has not had PT or MRI yet  told she can't have MRI bc of metal in prior procedure. Taking meloxicam  every other day   4) recent fall with facial trauma   in August  15 (after last visit )  anterior nasal septum fell on pavement after trying to dart under a slowly closing garage door.   right knee pain : was prescribed prednisone  for a walk, scheduled to receive an aspiration/injection tomorrow.    Outpatient Medications Prior to Visit  Medication Sig Dispense Refill   Accu-Chek Softclix Lancets lancets Use as instructed 100 each 12   atorvastatin  (LIPITOR) 40 MG tablet TAKE 1 TABLET BY MOUTH EVERY DAY 90 tablet 3   blood glucose meter kit and supplies KIT Dispense based on patient and insurance preference. Use up to four times daily as directed. (FOR ICD-9 250.00, 250.01). 1 each 0   Blood Glucose Monitoring Suppl (ACCU-CHEK GUIDE ME) w/Device KIT Use to check blood sugar up to four times daily. 1 kit 0   calcium -vitamin D  (OSCAL WITH D) 250-125 MG-UNIT per tablet Take 2 tablets by mouth daily.     COLLAGEN PO daily.     CREON  36000-114000 units CPEP capsule TAKE 1 CAPSULE (36,000 UNITS TOTAL) BY MOUTH 3 (THREE) TIMES DAILY WITH MEALS. MAY ALSO TAKE 1 CAPSULE (36,000 UNITS TOTAL) AS NEEDED (WITH SNACKS). 300 capsule 9   cyanocobalamin  (VITAMIN B12) 1000 MCG tablet Take 1 tablet (1,000 mcg total) by mouth daily.     cyclobenzaprine  (FLEXERIL ) 5 MG tablet Take 1 tablet (5 mg total) by mouth 3 (three) times  daily as needed. 9 tablet 0   empagliflozin  (JARDIANCE ) 25 MG TABS tablet TAKE 1 TABLET BY MOUTH EVERY DAY BEFORE BREAKFAST 90 tablet 3   glucose blood (ACCU-CHEK AVIVA PLUS) test strip USE TO TEST TWICE A DAY 100 strip 2   glucose blood (ACCU-CHEK GUIDE TEST) test strip 1 each by Other route as needed for other. USE TO CHECK SUGARS 2-3 TIMES DAILY. 200 strip 3   glucose blood (ACCU-CHEK GUIDE) test strip TEST BLOOD SUGAR TWICE DAILY 200 strip 3   hydroxychloroquine (PLAQUENIL) 200 MG tablet Take 200 mg by mouth. Take one tablet TWICE daily weekdays. Take one tablet ONCE daily weekends. Take with food     meloxicam  (MOBIC ) 7.5 MG tablet Take 1 tablet (7.5  mg total) by mouth every other day. 45 tablet 1   Multiple Vitamin (MULTIVITAMIN) tablet Take 1 tablet by mouth daily.     Probiotic Product (PROBIOTIC-10 PO) Take 1 capsule by mouth daily.     glipiZIDE  (GLUCOTROL ) 5 MG tablet TAKE 1 TABLET BY MOUTH DAILY WITH BREAKFAST and 1.5   tablet before dinner 180 tablet 2   losartan  (COZAAR ) 50 MG tablet TAKE 1 TABLET BY MOUTH EVERY DAY 90 tablet 3   No facility-administered medications prior to visit.    Review of Systems;  Patient denies headache, fevers, malaise, unintentional weight loss, skin rash, eye pain, sinus congestion and sinus pain, sore throat, dysphagia,  hemoptysis , cough, dyspnea, wheezing, chest pain, palpitations, orthopnea, edema, abdominal pain, nausea, melena, diarrhea, constipation, flank pain, dysuria, hematuria, urinary  Frequency, nocturia, numbness, tingling, seizures,  Focal weakness, Loss of consciousness,  Tremor, insomnia, depression, anxiety, and suicidal ideation.      Objective:  BP (!) 142/84   Pulse 72   Ht 5' 5 (1.651 m)   Wt 162 lb 3.2 oz (73.6 kg)   SpO2 99%   BMI 26.99 kg/m   BP Readings from Last 3 Encounters:  04/06/24 (!) 142/84  01/05/24 130/84  12/31/23 135/78    Wt Readings from Last 3 Encounters:  04/06/24 162 lb 3.2 oz (73.6 kg)  01/05/24 164 lb (74.4 kg)  12/31/23 163 lb 9.3 oz (74.2 kg)    Physical Exam Vitals reviewed.  Constitutional:      General: She is not in acute distress.    Appearance: Normal appearance. She is normal weight. She is not ill-appearing, toxic-appearing or diaphoretic.  HENT:     Head: Normocephalic.  Eyes:     General: No scleral icterus.       Right eye: No discharge.        Left eye: No discharge.     Conjunctiva/sclera: Conjunctivae normal.  Cardiovascular:     Rate and Rhythm: Normal rate and regular rhythm.     Heart sounds: Normal heart sounds.  Pulmonary:     Effort: Pulmonary effort is normal. No respiratory distress.     Breath sounds:  Normal breath sounds.  Musculoskeletal:     Cervical back: Normal.     Thoracic back: Normal.     Lumbar back: No bony tenderness. Decreased range of motion.       Back:     Comments: Increased lordosis by plain films   Skin:    General: Skin is warm and dry.  Neurological:     General: No focal deficit present.     Mental Status: She is alert and oriented to person, place, and time. Mental status is at baseline.  Psychiatric:  Mood and Affect: Mood normal.        Behavior: Behavior normal.        Thought Content: Thought content normal.        Judgment: Judgment normal.     Lab Results  Component Value Date   HGBA1C 7.1 (H) 04/04/2024   HGBA1C 7.0 (H) 12/23/2023   HGBA1C 6.7 (H) 11/12/2023    Lab Results  Component Value Date   CREATININE 0.79 04/04/2024   CREATININE 0.76 12/31/2023   CREATININE 0.80 12/23/2023   CREATININE 0.80 12/23/2023    Lab Results  Component Value Date   WBC 8.8 12/31/2023   HGB 15.5 (H) 12/31/2023   HCT 44.8 12/31/2023   PLT 186 12/31/2023   GLUCOSE 110 (H) 04/04/2024   CHOL 130 04/04/2024   TRIG 71.0 04/04/2024   HDL 68.00 04/04/2024   LDLDIRECT 49.0 04/04/2024   LDLCALC 48 04/04/2024   ALT 13 04/04/2024   AST 25 04/04/2024   NA 137 04/04/2024   K 5.0 04/04/2024   CL 102 04/04/2024   CREATININE 0.79 04/04/2024   BUN 13 04/04/2024   CO2 27 04/04/2024   TSH 2.02 10/13/2022   HGBA1C 7.1 (H) 04/04/2024   MICROALBUR <0.7 07/23/2023    CT ABDOMEN PELVIS W CONTRAST Result Date: 01/23/2024 CLINICAL DATA:  Bloating, constipation, and change in bowel habits for several months. EXAM: CT ABDOMEN AND PELVIS WITH CONTRAST TECHNIQUE: Multidetector CT imaging of the abdomen and pelvis was performed using the standard protocol following bolus administration of intravenous contrast. RADIATION DOSE REDUCTION: This exam was performed according to the departmental dose-optimization program which includes automated exposure control, adjustment  of the mA and/or kV according to patient size and/or use of iterative reconstruction technique. CONTRAST:  OMNIPAQUE  IOHEXOL  300 MG/ML  SOLN COMPARISON:  06/24/2020 FINDINGS: Lower Chest: No acute findings. Hepatobiliary: No suspicious hepatic masses identified. Gallbladder is unremarkable. No evidence of biliary ductal dilatation. Pancreas:  No mass or inflammatory changes. Spleen: Within normal limits in size and appearance. Adrenals/Urinary Tract: No suspicious masses identified. No evidence of ureteral calculi or hydronephrosis. Unremarkable unopacified urinary bladder. Stomach/Bowel: No evidence of obstruction, inflammatory process or abnormal fluid collections. Normal appendix visualized. Diverticulosis is seen mainly involving the sigmoid colon, however there is no evidence of diverticulitis. Vascular/Lymphatic: No pathologically enlarged lymph nodes. No acute vascular findings. Reproductive:  No mass or other significant abnormality. Other:  None. Musculoskeletal: No suspicious bone lesions identified. Lumbosacral spine fusion hardware again noted. IMPRESSION: No acute findings. Colonic diverticulosis, without radiographic evidence of diverticulitis. Electronically Signed   By: Norleen DELENA Kil M.D.   On: 01/23/2024 13:53    Assessment & Plan:  .Hyperlipidemia associated with type 2 diabetes mellitus (HCC) Assessment & Plan: A1c is  at goal. On sub maximal dose of glipizide .    Reviewed BS. Form the last 3 months.  No changes today .  Continue statin and increasing ARB dose today to 100 mg losartan  daily for elevated blood pressure confirmed with recent home readings of 143/78  Lab Results  Component Value Date   HGBA1C 7.1 (H) 04/04/2024   Lab Results  Component Value Date   MICROALBUR <0.7 07/23/2023   MICROALBUR 0.81 11/18/2011   Lab Results  Component Value Date   CHOL 130 04/04/2024   HDL 68.00 04/04/2024   LDLCALC 48 04/04/2024   LDLDIRECT 49.0 04/04/2024   TRIG 71.0 04/04/2024    CHOLHDL 2 04/04/2024     Lab Results  Component Value Date  LDLCALC 48 04/04/2024    Orders: -     Lipid panel; Future -     LDL cholesterol, direct; Future  Type 2 diabetes mellitus with other specified complication, without long-term current use of insulin  (HCC) -     Losartan  Potassium; Take 1 tablet (100 mg total) by mouth daily.  Dispense: 90 tablet; Refill: 1 -     Comprehensive metabolic panel with GFR; Future -     Hemoglobin A1c; Future  Claw toe, acquired, right  Claw toe, acquired, left  Spinal stenosis, lumbar region, with neurogenic claudication Assessment & Plan: Now with persistent  back pain that has not improved with back extension exercises. S he is seeing [facility] ; has not had PT or repeat imaging othern than plain films which note haedware from 2019 in place .  She does not have radiation to legs.  Continue meloxiam 7.5 mg every other day and enocuarged to increase tylenol  to 1000 mg tid.  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  She will continue  follow up semi annually with rheumatology    Lab Results  Component Value Date   ESRSEDRATE 5 11/12/2023       Pancreatic insufficiency Assessment & Plan: Continue use of Creon   twice daily   Chronic midline low back pain without sciatica Assessment & Plan: With history of spinal stenosis s/p remote multilevel lumbar decompression .   Extension exercises redemonstrated .  She starts PT tomorrow .  Continue tylenol  meloxocam and flexeril .  Will add tramadol  if needed    Other orders -     glipiZIDE ; TAKE 1 TABLET BY MOUTH DAILY WITH BREAKFAST and 1.5   tablet before dinner  Dispense: 180 tablet; Refill: 2   Follow-up: Return in about 4 months (around 08/04/2024) for follow up diabetes.   Sydney LITTIE Kettering,  MD

## 2024-04-06 NOTE — Assessment & Plan Note (Signed)
 With history of spinal stenosis s/p remote multilevel lumbar decompression .   Extension exercises redemonstrated .  She starts PT tomorrow .  Continue tylenol  meloxocam and flexeril .  Will add tramadol  if needed

## 2024-04-06 NOTE — Assessment & Plan Note (Signed)
 Continue use of Creon   twice daily

## 2024-04-06 NOTE — Assessment & Plan Note (Addendum)
 A1c is  at goal. On sub maximal dose of glipizide .    Reviewed BS. Form the last 3 months.  No changes today .  Continue statin and increasing ARB dose today to 100 mg losartan  daily for elevated blood pressure confirmed with recent home readings of 143/78  Lab Results  Component Value Date   HGBA1C 7.1 (H) 04/04/2024   Lab Results  Component Value Date   MICROALBUR <0.7 07/23/2023   MICROALBUR 0.81 11/18/2011   Lab Results  Component Value Date   CHOL 130 04/04/2024   HDL 68.00 04/04/2024   LDLCALC 48 04/04/2024   LDLDIRECT 49.0 04/04/2024   TRIG 71.0 04/04/2024   CHOLHDL 2 04/04/2024     Lab Results  Component Value Date   LDLCALC 48 04/04/2024

## 2024-04-07 DIAGNOSIS — M5441 Lumbago with sciatica, right side: Secondary | ICD-10-CM | POA: Diagnosis not present

## 2024-04-07 DIAGNOSIS — M1711 Unilateral primary osteoarthritis, right knee: Secondary | ICD-10-CM | POA: Diagnosis not present

## 2024-04-07 DIAGNOSIS — Z79899 Other long term (current) drug therapy: Secondary | ICD-10-CM | POA: Diagnosis not present

## 2024-04-07 DIAGNOSIS — M545 Low back pain, unspecified: Secondary | ICD-10-CM | POA: Diagnosis not present

## 2024-04-07 DIAGNOSIS — M0609 Rheumatoid arthritis without rheumatoid factor, multiple sites: Secondary | ICD-10-CM | POA: Diagnosis not present

## 2024-04-19 DIAGNOSIS — M545 Low back pain, unspecified: Secondary | ICD-10-CM | POA: Diagnosis not present

## 2024-04-24 ENCOUNTER — Encounter: Payer: Self-pay | Admitting: Internal Medicine

## 2024-04-24 ENCOUNTER — Other Ambulatory Visit: Payer: Self-pay | Admitting: Internal Medicine

## 2024-04-26 MED ORDER — HYDROCHLOROTHIAZIDE 25 MG PO TABS: 25.0000 mg | ORAL_TABLET | Freq: Every day | ORAL | 3 refills | Status: AC

## 2024-05-04 ENCOUNTER — Encounter: Payer: Self-pay | Admitting: Physician Assistant

## 2024-05-11 ENCOUNTER — Other Ambulatory Visit: Payer: Self-pay | Admitting: Internal Medicine

## 2024-05-18 ENCOUNTER — Other Ambulatory Visit: Payer: Self-pay | Admitting: Internal Medicine

## 2024-05-18 DIAGNOSIS — E782 Mixed hyperlipidemia: Secondary | ICD-10-CM

## 2024-05-29 ENCOUNTER — Other Ambulatory Visit
Admission: RE | Admit: 2024-05-29 | Discharge: 2024-05-29 | Disposition: A | Discharge status: Home / Self Care | Source: Ambulatory Visit | Attending: Rheumatology | Admitting: Rheumatology

## 2024-05-29 DIAGNOSIS — M1711 Unilateral primary osteoarthritis, right knee: Secondary | ICD-10-CM | POA: Diagnosis present

## 2024-05-29 LAB — SYNOVIAL CELL COUNT + DIFF, W/ CRYSTALS
Crystals, Fluid: NONE SEEN
Eosinophils-Synovial: 0 %
Lymphocytes-Synovial Fld: 22 %
Monocyte-Macrophage-Synovial Fluid: 53 %
Neutrophil, Synovial: 25 %
WBC, Synovial: 92 /mm3 (ref 0–200)

## 2024-06-04 NOTE — Progress Notes (Unsigned)
 "     Sydney Console, PA-C [address] [place], [place]  [address] Phone: [phone]   Gastroenterology Consultation  Referring Provider:     Marylynn Verneita CROME, MD Primary Care Physician:  Sydney Verneita CROME, MD Primary Gastroenterologist:  Sydney Console, PA-C / Glendia Holt, MD  Reason for Consultation:     Bloating, constipation, EPI        HPI:   Discussed the use of AI scribe software for clinical note transcription with the patient, who gave verbal consent to proceed.    I last saw patient at [facility] 01/2023.  She has history of iron deficiency anemia, chronic constipation, chronic bloating, and exocrine pancreatic insufficiency.  She takes MiraLAX for constipation which works well.  Tried and failed Trulance .  Takes align probiotic daily and Creon  36,000 lipase units 2 capsules with each meal and 1 with each snack.  Her GI symptoms have improved on this treatment.  She has donated blood and platelets frequently at the [facility] for many years.  This has been thought to be the source of her iron deficiency anemia.  She was advised not to donate blood at the [facility] anymore.  History of vitamin B 12 deficiency treated with B12 supplement.  Last labs 05/2024 showed normal hemoglobin 13.9.  Normal LFTs and kidney function.  Previous GI evaluation for iron deficiency anemia was unrevealing.  EGD by Dr. Unk 12/24/2022 was normal.  Biopsies negative for H. pylori and celiac.   Colonoscopy 12/24/2022 showed mild diverticulosis, otherwise normal.   Capsule Endoscopy 01/01/23 was Normal. History of Present Illness Bowel Habit Abnormalities: - Chronic constipation with frequent bloating and excessive gas - Sensation of incomplete evacuation after bowel movements - Stools often thin and in small pieces - Multiple bowel movements in the morning, but persistent incomplete evacuation - Occasional mild abdominal pain - Small pieces of stool sometimes passed unexpectedly during  urination - No use of fiber supplements - Nightly Miralax use, which she attributes to increased bloating and gas - Intermittent use of Dulcolax when unable to have a bowel movement - Linzess  previously trialed but not tolerated due to diarrhea and was cost-prohibitive  Exocrine Pancreatic Insufficiency: - Creon  36,000 lipase units 2 with each meal and 1 with each snack - Recently ran out of Creon  without significant change in bowel movements. - Needs refill of Creon .  Weight Changes and Nutritional Status: -Weight is up 6 pounds in the past 5 months. - Recent weight stability - Prefers stretchy pants due to bloating - No recent changes in appetite or nausea  Diagnostic Evaluation: - Colonoscopy in August 2024 showed no polyps, masses, or obstruction - CT abdomen and pelvis in September 2025 showed normal abdominal organs and diverticulosis without diverticulitis or acute findings - Upper endoscopy and capsule endoscopy were normal  Laboratory Findings: - Recent blood work from January 2026 showed normal hemoglobin, white blood cell count, and liver and kidney function tests     Past Medical History:  Diagnosis Date   COVID-19 virus infection 12/10/2020   Positive test December 04 2020    Diabetes mellitus    Dyspnea    Gastritis    High cholesterol    Hyperlipidemia    Lipitor trial was a failure   Hypertension    PMR (polymyalgia rheumatica)    Syncope 01/15/2019    Past Surgical History:  Procedure Laterality Date   BACK SURGERY  1995   BIOPSY  12/24/2022   Procedure: BIOPSY;  Surgeon: Unk Corinn Skiff, MD;  Location: [facility] ENDOSCOPY;  Service: Gastroenterology;;   CARDIAC CATHETERIZATION  12/16/2005   normal coronaries,  EF 60%   COLONOSCOPY WITH PROPOFOL  N/A 04/30/2021   Procedure: COLONOSCOPY WITH PROPOFOL ;  Surgeon: Janalyn Keene NOVAK, MD;  Location: [facility] ENDOSCOPY;  Service: Endoscopy;  Laterality: N/A;  DM   COLONOSCOPY WITH PROPOFOL  N/A 12/24/2022    Procedure: COLONOSCOPY WITH PROPOFOL ;  Surgeon: Unk Corinn Skiff, MD;  Location: [facility] END[facility]COPY;  Service: Gastroenterology;  Laterality: N/A;   ESOPHAGOGASTRODUODENOSCOPY (EGD) WITH PROPOFOL  N/A 12/24/2022   Procedure: ESOPHAGOGASTRODUODENOSCOPY (EGD) WITH PROPOFOL ;  Surgeon: Unk Corinn Skiff, MD;  Location: [facility] ENDOSCOPY;  Service: Gastroenterology;  Laterality: N/A;   GIVENS CAPSULE STUDY  12/24/2022   Procedure: GIVENS CAPSULE STUDY;  Surgeon: Unk Corinn Skiff, MD;  Location: [facility] ENDOSCOPY;  Service: Gastroenterology;;   SPINE SURGERY  05/18/1993   SPINE SURGERY     bone graft    TUBAL LIGATION      Prior to Admission medications  Medication Sig Start Date End Date Taking? Authorizing Provider  ACCU-CHEK GUIDE TEST test strip 1 EACH BY OTHER ROUTE AS NEEDED FOR OTHER. USE TO CHECK SUGARS 2-3 TIMES DAILY. 05/12/24   Sydney Verneita CROME, MD  Accu-Chek Softclix Lancets lancets Use as instructed 07/28/22   Sydney Verneita CROME, MD  atorvastatin  (LIPITOR) 40 MG tablet TAKE 1 TABLET BY MOUTH EVERY DAY 05/19/24   Sydney Verneita CROME, MD  blood glucose meter kit and supplies KIT Dispense based on patient and insurance preference. Use up to four times daily as directed. (FOR ICD-9 250.00, 250.01). 08/29/14   Sydney Verneita CROME, MD  Blood Glucose Monitoring Suppl (ACCU-CHEK GUIDE ME) w/Device KIT Use to check blood sugar up to four times daily. 07/23/22   Sydney Verneita CROME, MD  calcium -vitamin D  (OSCAL WITH D) 250-125 MG-UNIT per tablet Take 2 tablets by mouth daily.    [provider]  COLLAGEN PO daily.    [provider]  CREON  36000-114000 units CPEP capsule TAKE 1 CAPSULE (36,000 UNITS TOTAL) BY MOUTH 3 (THREE) TIMES DAILY WITH MEALS. MAY ALSO TAKE 1 CAPSULE (36,000 UNITS TOTAL) AS NEEDED (WITH SNACKS). 02/17/24   Sydney Verneita CROME, MD  cyanocobalamin  (VITAMIN B12) 1000 MCG tablet Take 1 tablet (1,000 mcg total) by mouth daily. 01/19/23   Sydney Verneita CROME, MD  cyclobenzaprine  (FLEXERIL ) 5 MG tablet  Take 1 tablet (5 mg total) by mouth 3 (three) times daily as needed. 12/31/23   Menshew, Candida LULLA Kings, PA-C  empagliflozin  (JARDIANCE ) 25 MG TABS tablet TAKE 1 TABLET BY MOUTH EVERY DAY BEFORE BREAKFAST 07/27/23   Sydney Verneita CROME, MD  glipiZIDE  (GLUCOTROL ) 5 MG tablet TAKE 1 TABLET BY MOUTH DAILY WITH BREAKFAST and 1.5   tablet before dinner 04/06/24   Tullo, Teresa L, MD  glucose blood (ACCU-CHEK AVIVA PLUS) test strip USE TO TEST TWICE A DAY 07/28/22   Sydney Verneita CROME, MD  glucose blood (ACCU-CHEK GUIDE) test strip TEST BLOOD SUGAR TWICE DAILY 10/28/22   Sydney Verneita CROME, MD  hydrochlorothiazide  (HYDRODIURIL ) 25 MG tablet Take 1 tablet (25 mg total) by mouth daily. 04/26/24   Tullo, Teresa L, MD  hydroxychloroquine (PLAQUENIL) 200 MG tablet Take 200 mg by mouth. Take one tablet TWICE daily weekdays. Take one tablet ONCE daily weekends. Take with food 12/09/23   [provider]  losartan  (COZAAR ) 100 MG tablet Take 1 tablet (100 mg total) by mouth daily. 04/06/24   Sydney Verneita CROME, MD  meloxicam  (  MOBIC ) 7.5 MG tablet Take 1 tablet (7.5 mg total) by mouth every other day. 12/30/23   Tullo, Teresa L, MD  Multiple Vitamin (MULTIVITAMIN) tablet Take 1 tablet by mouth daily.    [provider]  Probiotic Product (PROBIOTIC-10 PO) Take 1 capsule by mouth daily.    [provider]  ferrous sulfate 325 (65 FE) MG tablet Take 325 mg by mouth daily with breakfast.  08/06/11  [provider]    Family History  Problem Relation Age of Onset   Diabetes Mother    Heart disease Father    Stroke Father    Diabetes Father    Diabetes Brother    Cancer Brother 48       pancreatic cancer   Diabetes Brother    Diabetes Maternal Grandfather    Breast cancer Neg Hx      Social History[1]  Allergies as of 06/09/2024 - Review Complete 06/09/2024  Allergen Reaction Noted   Amoxicillin Hives 02/11/2011   Latex Rash 01/06/2013    Review of Systems:    All systems reviewed and  negative except where noted in HPI.   Physical Exam:  BP 130/64   Pulse 67   Ht 5' 5 (1.651 m)   Wt 166 lb (75.3 kg)   BMI 27.62 kg/m  No LMP recorded. Patient is postmenopausal.  General:   Alert,  Well-developed, well-nourished, pleasant and cooperative in NAD Lungs:  Respirations even and unlabored.  Clear throughout to auscultation.   No wheezes, crackles, or rhonchi. No acute distress. Heart:  Regular rate and rhythm; no murmurs, clicks, rubs, or gallops. Abdomen:  Normal bowel sounds.  No bruits.  Soft, and non-distended without masses, hepatosplenomegaly or hernias noted.  No Tenderness.  No guarding or rebound tenderness.    Neurologic:  Alert and oriented x3;  grossly normal neurologically. Psych:  Alert and cooperative. Normal mood and affect.   Imaging Studies: No results found.  Labs: CBC    Component Value Date/Time   WBC 8.8 12/31/2023 2050   RBC 4.94 12/31/2023 2050   HGB 15.5 (H) 12/31/2023 2050   HCT 44.8 12/31/2023 2050   PLT 186 12/31/2023 2050   MCV 90.7 12/31/2023 2050    CMP     Component Value Date/Time   NA 137 04/04/2024 0839   K 5.0 04/04/2024 0839   CL 102 04/04/2024 0839   CO2 27 04/04/2024 0839   GLUCOSE 110 (H) 04/04/2024 0839   BUN 13 04/04/2024 0839   CREATININE 0.79 04/04/2024 0839   CREATININE 0.74 07/29/2017 0921   CALCIUM  9.4 04/04/2024 0839   PROT 6.9 04/04/2024 0839   ALBUMIN 4.3 04/04/2024 0839   AST 25 04/04/2024 0839   ALT 13 04/04/2024 0839   ALKPHOS 83 04/04/2024 0839   BILITOT 0.9 04/04/2024 0839   GFRNONAA >60 12/31/2023 2050   GFRAA >60 01/05/2019 1007    Assessment and Plan:   BRECKLYNN JIAN is a 79 y.o. y/o female has been referred for:   1.  Exocrine pancreatic insufficiency - Refilled Creon  36,000 lipase units 2 with each meal and 1 with each snack, #300, 11 refills.  2.  Chronic constipation and bloating - Stop MiraLAX which could be causing bloating and thin stools. - Start Senokot S8 0.6-50 mg 2  tablets once or twice daily. - Start Metamucil 1 to 2 tablespoons daily. - Low FODMAP diet given to help with gas and bloating. - If gas and bloating persist, consider SIBO test.  3.  History of iron deficiency anemia resolved after she stopped donating blood at the [facility].  EGD, colonoscopy, and capsule endoscopy showed no source for anemia. - Recent normal hemoglobin 13.   - Continue monitoring labs through PCP. - Continue to avoid donating blood at the [facility]. - Follow-up if she has any recurrent iron deficiency anemia. - Reassurance regarding normal CT scan in the past 6 months.   Follow up in 6 weeks with TG.  Sydney Console, PA-C       [1]  Social History Tobacco Use   Smoking status: Never   Smokeless tobacco: Never  Vaping Use   Vaping status: Never Used  Substance Use Topics   Alcohol use: No   Drug use: No   "

## 2024-06-09 ENCOUNTER — Encounter: Payer: Self-pay | Admitting: Physician Assistant

## 2024-06-09 ENCOUNTER — Ambulatory Visit: Admitting: Physician Assistant

## 2024-06-09 VITALS — BP 130/64 | HR 67 | Ht 65.0 in | Wt 166.0 lb

## 2024-06-09 DIAGNOSIS — K5909 Other constipation: Secondary | ICD-10-CM | POA: Diagnosis not present

## 2024-06-09 DIAGNOSIS — E1169 Type 2 diabetes mellitus with other specified complication: Secondary | ICD-10-CM

## 2024-06-09 DIAGNOSIS — K8689 Other specified diseases of pancreas: Secondary | ICD-10-CM

## 2024-06-09 DIAGNOSIS — K5904 Chronic idiopathic constipation: Secondary | ICD-10-CM

## 2024-06-09 DIAGNOSIS — K8681 Exocrine pancreatic insufficiency: Secondary | ICD-10-CM

## 2024-06-09 DIAGNOSIS — R143 Flatulence: Secondary | ICD-10-CM

## 2024-06-09 DIAGNOSIS — M05719 Rheumatoid arthritis with rheumatoid factor of unspecified shoulder without organ or systems involvement: Secondary | ICD-10-CM

## 2024-06-09 DIAGNOSIS — R140 Abdominal distension (gaseous): Secondary | ICD-10-CM | POA: Diagnosis not present

## 2024-06-09 MED ORDER — SENNOSIDES-DOCUSATE SODIUM 8.6-50 MG PO TABS: 2.0000 | ORAL_TABLET | Freq: Two times a day (BID) | ORAL | 11 refills | Status: AC

## 2024-06-09 MED ORDER — PANCRELIPASE (LIP-PROT-AMYL) 36000-114000 UNITS PO CPEP: ORAL_CAPSULE | ORAL | 11 refills | Status: AC

## 2024-06-09 NOTE — Patient Instructions (Addendum)
" ° °  Start Metamucil (Psyllium Husk) Fiber Powder: Metamucil powders: Put 1-2 rounded spoons in an empty glass. If you're taking Metamucil Sugar-Free Powder or Premium Blend, use a teaspoon. If you're taking Metamucil with Real Sugar, use a tablespoon. Mix briskly with 8 oz. or more of cool liquid. Drink promptly, and enjoy! Drink 64 ounces of water or other liquids daily.   STOP MIRALAX.  START Rx Senokot-S 8.6mg -50mg .  Take 2 tablets once or twice daily every day for constipation.  Thank you for trusting me with your gastrointestinal care!   Ellouise Console, PA-C   _______________________________________________________  If your blood pressure at your visit was 140/90 or greater, please contact your primary care physician to follow up on this.  _______________________________________________________  If you are age 89 or older, your body mass index should be between 23-30. Your Body mass index is 27.62 kg/m. If this is out of the aforementioned range listed, please consider follow up with your Primary Care Provider.  If you are age 66 or younger, your body mass index should be between 19-25. Your Body mass index is 27.62 kg/m. If this is out of the aformentioned range listed, please consider follow up with your Primary Care Provider.   ________________________________________________________  The [facility] providers would like to encourage you to use MYCHART to communicate with providers for non-urgent requests or questions.  Due to long hold times on the telephone, sending your provider a message by [facility] may be a faster and more efficient way to get a response.  Please allow 48 business hours for a response.  Please remember that this is for non-urgent requests.  _______________________________________________________  [facility] is using a team-based approach to care.  Your team is made up of your doctor and two to three APPS. Our APPS (Nurse Practitioners and  Physician Assistants) work with your physician to ensure care continuity for you. They are fully qualified to address your health concerns and develop a treatment plan. They communicate directly with your gastroenterologist to care for you. Seeing the Advanced Practice Practitioners on your physician's team can help you by facilitating care more promptly, often allowing for earlier appointments, access to diagnostic testing, procedures, and other specialty referrals.   "

## 2024-06-12 NOTE — Progress Notes (Signed)
 Agree with the assessment and plan as outlined by Brigitte Canard, PA-C.

## 2024-06-30 ENCOUNTER — Ambulatory Visit: Payer: Medicare PPO

## 2024-07-21 ENCOUNTER — Ambulatory Visit: Admitting: Physician Assistant

## 2024-08-02 ENCOUNTER — Other Ambulatory Visit

## 2024-08-04 ENCOUNTER — Ambulatory Visit: Admitting: Internal Medicine
